# Patient Record
Sex: Female | Born: 1950 | Race: White | Hispanic: No | Smoking: Never smoker
Health system: Southern US, Community
[De-identification: ages and names within clinical notes are randomized; demographics above are authoritative.]

## PROBLEM LIST (undated history)

## (undated) DIAGNOSIS — R112 Nausea with vomiting, unspecified: Secondary | ICD-10-CM

## (undated) DIAGNOSIS — Z9889 Other specified postprocedural states: Secondary | ICD-10-CM

## (undated) DIAGNOSIS — I639 Cerebral infarction, unspecified: Secondary | ICD-10-CM

## (undated) DIAGNOSIS — K219 Gastro-esophageal reflux disease without esophagitis: Secondary | ICD-10-CM

## (undated) DIAGNOSIS — E785 Hyperlipidemia, unspecified: Secondary | ICD-10-CM

## (undated) DIAGNOSIS — I10 Essential (primary) hypertension: Secondary | ICD-10-CM

## (undated) DIAGNOSIS — H9191 Unspecified hearing loss, right ear: Secondary | ICD-10-CM

## (undated) DIAGNOSIS — I5189 Other ill-defined heart diseases: Secondary | ICD-10-CM

## (undated) DIAGNOSIS — F32A Depression, unspecified: Secondary | ICD-10-CM

## (undated) DIAGNOSIS — F329 Major depressive disorder, single episode, unspecified: Secondary | ICD-10-CM

## (undated) DIAGNOSIS — C44300 Unspecified malignant neoplasm of skin of unspecified part of face: Secondary | ICD-10-CM

## (undated) DIAGNOSIS — M1990 Unspecified osteoarthritis, unspecified site: Secondary | ICD-10-CM

## (undated) DIAGNOSIS — I2510 Atherosclerotic heart disease of native coronary artery without angina pectoris: Secondary | ICD-10-CM

## (undated) HISTORY — PX: CARDIAC CATHETERIZATION: SHX172

## (undated) HISTORY — DX: Other ill-defined heart diseases: I51.89

## (undated) HISTORY — PX: TUBAL LIGATION: SHX77

## (undated) HISTORY — DX: Atherosclerotic heart disease of native coronary artery without angina pectoris: I25.10

---

## 1982-12-29 HISTORY — PX: BREAST BIOPSY: SHX20

## 1988-12-29 HISTORY — PX: CHOLECYSTECTOMY: SHX55

## 1995-12-30 DIAGNOSIS — I639 Cerebral infarction, unspecified: Secondary | ICD-10-CM

## 1995-12-30 HISTORY — DX: Cerebral infarction, unspecified: I63.9

## 2004-12-29 DIAGNOSIS — I639 Cerebral infarction, unspecified: Secondary | ICD-10-CM

## 2004-12-29 HISTORY — DX: Cerebral infarction, unspecified: I63.9

## 2013-02-02 ENCOUNTER — Inpatient Hospital Stay (HOSPITAL_COMMUNITY)
Admission: EM | Admit: 2013-02-02 | Discharge: 2013-02-04 | DRG: 072 | Disposition: A | Discharge status: Home / Self Care | Payer: 59 | Attending: Internal Medicine | Admitting: Internal Medicine

## 2013-02-02 ENCOUNTER — Encounter (HOSPITAL_COMMUNITY): Payer: Self-pay

## 2013-02-02 ENCOUNTER — Inpatient Hospital Stay (HOSPITAL_COMMUNITY): Payer: 59

## 2013-02-02 ENCOUNTER — Emergency Department (HOSPITAL_COMMUNITY): Payer: 59

## 2013-02-02 DIAGNOSIS — R519 Headache, unspecified: Secondary | ICD-10-CM | POA: Diagnosis present

## 2013-02-02 DIAGNOSIS — R278 Other lack of coordination: Secondary | ICD-10-CM

## 2013-02-02 DIAGNOSIS — G9340 Encephalopathy, unspecified: Principal | ICD-10-CM

## 2013-02-02 DIAGNOSIS — I6789 Other cerebrovascular disease: Secondary | ICD-10-CM

## 2013-02-02 DIAGNOSIS — R51 Headache: Secondary | ICD-10-CM

## 2013-02-02 DIAGNOSIS — R739 Hyperglycemia, unspecified: Secondary | ICD-10-CM

## 2013-02-02 DIAGNOSIS — R825 Elevated urine levels of drugs, medicaments and biological substances: Secondary | ICD-10-CM | POA: Diagnosis present

## 2013-02-02 DIAGNOSIS — I69998 Other sequelae following unspecified cerebrovascular disease: Secondary | ICD-10-CM

## 2013-02-02 DIAGNOSIS — K219 Gastro-esophageal reflux disease without esophagitis: Secondary | ICD-10-CM

## 2013-02-02 DIAGNOSIS — Z8673 Personal history of transient ischemic attack (TIA), and cerebral infarction without residual deficits: Secondary | ICD-10-CM

## 2013-02-02 DIAGNOSIS — H9190 Unspecified hearing loss, unspecified ear: Secondary | ICD-10-CM | POA: Diagnosis present

## 2013-02-02 DIAGNOSIS — R7989 Other specified abnormal findings of blood chemistry: Secondary | ICD-10-CM | POA: Diagnosis present

## 2013-02-02 DIAGNOSIS — E860 Dehydration: Secondary | ICD-10-CM | POA: Diagnosis present

## 2013-02-02 DIAGNOSIS — R509 Fever, unspecified: Secondary | ICD-10-CM

## 2013-02-02 DIAGNOSIS — R279 Unspecified lack of coordination: Secondary | ICD-10-CM | POA: Diagnosis present

## 2013-02-02 DIAGNOSIS — I10 Essential (primary) hypertension: Secondary | ICD-10-CM

## 2013-02-02 DIAGNOSIS — E785 Hyperlipidemia, unspecified: Secondary | ICD-10-CM | POA: Diagnosis present

## 2013-02-02 DIAGNOSIS — R9431 Abnormal electrocardiogram [ECG] [EKG]: Secondary | ICD-10-CM

## 2013-02-02 DIAGNOSIS — R7309 Other abnormal glucose: Secondary | ICD-10-CM | POA: Diagnosis present

## 2013-02-02 DIAGNOSIS — B9789 Other viral agents as the cause of diseases classified elsewhere: Secondary | ICD-10-CM | POA: Diagnosis present

## 2013-02-02 HISTORY — DX: Hyperlipidemia, unspecified: E78.5

## 2013-02-02 HISTORY — DX: Cerebral infarction, unspecified: I63.9

## 2013-02-02 HISTORY — DX: Essential (primary) hypertension: I10

## 2013-02-02 HISTORY — DX: Gastro-esophageal reflux disease without esophagitis: K21.9

## 2013-02-02 LAB — COMPREHENSIVE METABOLIC PANEL
Albumin: 3.7 g/dL (ref 3.5–5.2)
Alkaline Phosphatase: 74 U/L (ref 39–117)
BUN: 14 mg/dL (ref 6–23)
CO2: 29 mEq/L (ref 19–32)
Chloride: 102 mEq/L (ref 96–112)
Creatinine, Ser: 0.66 mg/dL (ref 0.50–1.10)
GFR calc non Af Amer: >90 mL/min (ref >90)
Potassium: 3.6 mEq/L (ref 3.5–5.1)
Total Bilirubin: 0.1 mg/dL — ABNORMAL LOW (ref 0.3–1.2)

## 2013-02-02 LAB — URINALYSIS, ROUTINE W REFLEX MICROSCOPIC
Glucose, UA: NEGATIVE mg/dL
Hgb urine dipstick: NEGATIVE
Ketones, ur: 15 mg/dL — AB
Protein, ur: NEGATIVE mg/dL
Urobilinogen, UA: 1 mg/dL (ref 0.0–1.0)

## 2013-02-02 LAB — RAPID URINE DRUG SCREEN, HOSP PERFORMED
Amphetamines: NOT DETECTED
Benzodiazepines: NOT DETECTED
Cocaine: NOT DETECTED
Opiates: POSITIVE — AB

## 2013-02-02 LAB — CBC
HCT: 42.1 % (ref 36.0–46.0)
Hemoglobin: 14 g/dL (ref 12.0–15.0)
MCV: 99.3 fL (ref 78.0–100.0)
RBC: 4.24 MIL/uL (ref 3.87–5.11)
WBC: 10 10*3/uL (ref 4.0–10.5)

## 2013-02-02 LAB — TROPONIN I: Troponin I: 0.59 ng/mL (ref <0.30)

## 2013-02-02 LAB — POCT I-STAT, CHEM 8
BUN: 14 mg/dL (ref 6–23)
Calcium, Ion: 1.08 mmol/L — ABNORMAL LOW (ref 1.13–1.30)
Creatinine, Ser: 0.8 mg/dL (ref 0.50–1.10)
Hemoglobin: 14.3 g/dL (ref 12.0–15.0)
Sodium: 143 mEq/L (ref 135–145)
TCO2: 30 mmol/L (ref 0–100)

## 2013-02-02 LAB — GRAM STAIN

## 2013-02-02 LAB — CSF CELL COUNT WITH DIFFERENTIAL
RBC Count, CSF: 6 /mm3 — ABNORMAL HIGH
Tube #: 3

## 2013-02-02 LAB — INFLUENZA PANEL BY PCR (TYPE A & B)
Influenza A By PCR: NEGATIVE
Influenza B By PCR: NEGATIVE

## 2013-02-02 LAB — AMMONIA: Ammonia: 28 umol/L (ref 11–60)

## 2013-02-02 LAB — POCT I-STAT TROPONIN I: Troponin i, poc: 0.25 ng/mL (ref 0.00–0.08)

## 2013-02-02 MED ORDER — ACETAMINOPHEN 650 MG RE SUPP
650.0000 mg | Freq: Once | RECTAL | Status: AC
  Administered 2013-02-02: 650 mg via RECTAL
  Filled 2013-02-02: qty 1

## 2013-02-02 MED ORDER — ACETAMINOPHEN 325 MG PO TABS
650.0000 mg | ORAL_TABLET | ORAL | Status: DC | PRN
  Administered 2013-02-03: 650 mg via ORAL
  Filled 2013-02-02: qty 2

## 2013-02-02 MED ORDER — ASPIRIN 300 MG RE SUPP
300.0000 mg | Freq: Once | RECTAL | Status: AC
  Administered 2013-02-02: 300 mg via RECTAL
  Filled 2013-02-02: qty 1

## 2013-02-02 MED ORDER — ACETAMINOPHEN 325 MG PO TABS: 650.0000 mg | ORAL_TABLET | Freq: Once | ORAL | Status: DC

## 2013-02-02 MED ORDER — HYDROCHLOROTHIAZIDE 25 MG PO TABS
25.0000 mg | ORAL_TABLET | Freq: Every day | ORAL | Status: DC
  Administered 2013-02-02 – 2013-02-03 (×2): 25 mg via ORAL
  Filled 2013-02-02 (×2): qty 1

## 2013-02-02 MED ORDER — KETOROLAC TROMETHAMINE 30 MG/ML IJ SOLN
30.0000 mg | Freq: Four times a day (QID) | INTRAMUSCULAR | Status: DC | PRN
  Filled 2013-02-02: qty 1

## 2013-02-02 MED ORDER — SIMVASTATIN 20 MG PO TABS
20.0000 mg | ORAL_TABLET | Freq: Every day | ORAL | Status: DC
  Administered 2013-02-02 – 2013-02-03 (×2): 20 mg via ORAL
  Filled 2013-02-02 (×3): qty 1

## 2013-02-02 MED ORDER — ASPIRIN 300 MG RE SUPP
300.0000 mg | Freq: Every day | RECTAL | Status: DC
  Filled 2013-02-02 (×2): qty 1

## 2013-02-02 MED ORDER — BENAZEPRIL HCL 20 MG PO TABS
20.0000 mg | ORAL_TABLET | Freq: Every day | ORAL | Status: DC
  Administered 2013-02-02 – 2013-02-03 (×2): 20 mg via ORAL
  Filled 2013-02-02 (×2): qty 1

## 2013-02-02 MED ORDER — SODIUM CHLORIDE 0.9 % IV SOLN
INTRAVENOUS | Status: DC
  Administered 2013-02-02: 20:00:00 via INTRAVENOUS

## 2013-02-02 MED ORDER — ASPIRIN 325 MG PO TABS
325.0000 mg | ORAL_TABLET | Freq: Every day | ORAL | Status: DC
  Administered 2013-02-02 – 2013-02-03 (×2): 325 mg via ORAL
  Filled 2013-02-02 (×2): qty 1

## 2013-02-02 MED ORDER — BENAZEPRIL-HYDROCHLOROTHIAZIDE 20-25 MG PO TABS: 1.0000 | ORAL_TABLET | Freq: Every day | ORAL | Status: DC

## 2013-02-02 MED ORDER — ONDANSETRON HCL 4 MG/2ML IJ SOLN
4.0000 mg | Freq: Four times a day (QID) | INTRAMUSCULAR | Status: DC | PRN
  Administered 2013-02-02 (×3): 4 mg via INTRAVENOUS
  Filled 2013-02-02 (×2): qty 2

## 2013-02-02 MED ORDER — SENNOSIDES-DOCUSATE SODIUM 8.6-50 MG PO TABS: 1.0000 | ORAL_TABLET | Freq: Every evening | ORAL | Status: DC | PRN

## 2013-02-02 MED ORDER — CITALOPRAM HYDROBROMIDE 20 MG PO TABS
20.0000 mg | ORAL_TABLET | Freq: Every day | ORAL | Status: DC
  Administered 2013-02-02 – 2013-02-04 (×3): 20 mg via ORAL
  Filled 2013-02-02 (×3): qty 1

## 2013-02-02 MED ORDER — PANTOPRAZOLE SODIUM 40 MG PO TBEC
40.0000 mg | DELAYED_RELEASE_TABLET | Freq: Every day | ORAL | Status: DC
  Administered 2013-02-02 – 2013-02-04 (×3): 40 mg via ORAL
  Filled 2013-02-02 (×3): qty 1

## 2013-02-02 MED ORDER — SODIUM CHLORIDE 0.9 % IV BOLUS (SEPSIS)
1000.0000 mL | Freq: Once | INTRAVENOUS | Status: AC
  Administered 2013-02-02: 1000 mL via INTRAVENOUS

## 2013-02-02 NOTE — H&P (Signed)
[facility] History and Physical  Diane [name] Diane [name]:[id number] DOB: 19-Jul-1951 DOA: 02/02/2013  Referring physician: Valentina [name] PCP: Default, Provider, MD Dr. Sherril Cong Diane [name], Diane [name])  Chief Complaint: Fever, lethargy  History of Present Illness: Diane [name] is an 62 y.o. female with a PMH of CVA and HTN (only deficit from stroke is residual deafness right ear) who was brought to the ER via EMS after her fiancee called due to lethargy and fever.  Her fiancee was checking on her frequently through the night because she hadn't been feeling well (headache, cough, fatigue, nausea, back pain).  Had influenza vaccine this year.  No myalgias or arthralgias.  No vomiting.  Headache is global and rated an 8/10 at its worst, and worsens with noise and light.  The patient is disoriented that has no other focal neurological deficits except for residual deafness in her right ear as previously noted. A CT scan of her head does not show any acute process. An LP has been done by the emergency department physician and results are pending. Urine drug screen was positive for opiates and barbiturates however the patient states she took Fioricet 3 for her headache pain last night.  No leukocytosis and liver and kidney function are normal. She is being admitted for further evaluation and workup.  Review of Systems: Constitutional: + fever, no chills;  Appetite normal; No weight loss, no weight gain.  HEENT: No blurry vision, no diplopia, no pharyngitis, no dysphagia CV: No chest pain, no palpitations.  Resp: No SOB, + productive cough. GI: + nausea, no vomiting, no diarrhea, no melena, no hematochezia.  GU: + dysuria, no hematuria.  MSK: no myalgias, no arthralgias.  Neuro:  + headache, no focal neurological deficits, no history of seizures.  Psych: No depression, no anxiety.  Endo: No thyroid disease, no DM, no heat intolerance, no cold intolerance, no polyuria, no polydipsia  Skin: No rashes, no skin  lesions.  Heme: No easy bruising, no history of blood diseases.  Past Medical History Past Medical History  Diagnosis Date  . CVA (cerebral infarction)   . Hypertension   . GERD (gastroesophageal reflux disease)   . Hyperlipidemia      Past Surgical History Past Surgical History  Procedure Date  . Cesarean section      Social History: History   Social History  . Marital Status: Single    Spouse Name: Diane [name]    Number of Children: 8  . Years of Education: N/A   Occupational History  . Works full time at [employer] as [employer]   Social History Main Topics  . Smoking status: Never Smoker   . Smokeless tobacco: Not on file  . Alcohol Use: Yes     Comment: Rare social ETOH  . Drug Use: No  . Sexually Active: Not on file   Other Topics Concern  . Not on file   Social History Narrative   Divorced, but has fiancee.  Lives with fiancee in [place].  Moved here recently from [place], [place].  Normally independent of ADLs.    Family History:  Family History  Problem Relation Age of Onset  . Stroke Father   . Heart failure Father     Allergies: Review of patient's allergies indicates no known allergies.  Meds: Prior to Admission medications   Medication Sig Start Date End Date Taking? Authorizing Provider  benazepril-hydrochlorthiazide (LOTENSIN HCT) 20-25 MG per tablet Take 1 tablet by mouth daily.   Yes Historical Provider,  MD  esomeprazole (NEXIUM) 40 MG capsule Take 40 mg by mouth daily before breakfast.   Yes Historical Provider, MD  PRESCRIPTION MEDICATION Take 1 tablet by mouth daily. Cholesterol Medicine   Yes Historical Provider, MD    Physical Exam: Filed Vitals:   02/02/13 0425 02/02/13 0515 02/02/13 0558 02/02/13 0630  BP:  132/62 140/74 120/77  Pulse:  99 96 102  Temp:   99.2 F (37.3 C)   TempSrc:   Oral   Resp:  17 17 14   SpO2: 89% 96% 94% 100%     Physical Exam: Blood pressure 120/77, pulse 102, temperature 99.2 F (37.3  C), temperature source Oral, resp. rate 14, SpO2 100.00%. Gen: No acute distress. Head: Normocephalic, atraumatic. Eyes: PERRL, EOMI, full visual fields, sclerae nonicteric. Mouth: Oropharynx clear with dry mucous membranes. Tongue midline. Palate rises symmetrically. Neck: Supple, no thyromegaly, no lymphadenopathy, no jugular venous distention. No carotid bruits. Chest: Lungs clear to auscultation bilaterally with good air movement. CV: Heart sounds are tachycardic, regular, without murmur, rub, or gallops. Abdomen: Soft, nontender, nondistended with normal active bowel sounds. Extremities: Extremities are without clubbing, edema, or cyanosis. Skin: Warm and dry. Neuro: Alert and oriented times 2; cranial nerves II through XII grossly intact (deaf right ear).  Answers her birthdate when asked what today's date it is.  Thinks it is spring (season), knows she is at [facility]. Moves all extremities x4 with equal strength. No pronator drift. Prominent asterixis. Psych: Mood and affect normal.  Labs on Admission:  Basic Metabolic Panel:  Lab 02/02/13 1308 02/02/13 0409  NA 143 142  K 3.6 3.6  CL 105 102  CO2 -- 29  GLUCOSE 125* 129*  BUN 14 14  CREATININE 0.80 0.66  CALCIUM -- 8.9  MG -- --  PHOS -- --   Liver Function Tests:  Lab 02/02/13 0409  AST 29  ALT 32  ALKPHOS 74  BILITOT 0.1*  PROT 7.0  ALBUMIN 3.7    Lab 02/02/13 0602  AMMONIA 28   CBC:  Lab 02/02/13 0427 02/02/13 0409  WBC -- 10.0  NEUTROABS -- --  HGB 14.3 14.0  HCT 42.0 42.1  MCV -- 99.3  PLT -- 197   Cardiac Enzymes:  Lab 02/02/13 0409  CKTOTAL --  CKMB --  CKMBINDEX --  TROPONINI 0.59*   Urinalysis    Component Value Date/Time   COLORURINE YELLOW 02/02/2013 0435   APPEARANCEUR CLEAR 02/02/2013 0435   LABSPEC 1.030 02/02/2013 0435   PHURINE 5.5 02/02/2013 0435   GLUCOSEU NEGATIVE 02/02/2013 0435   HGBUR NEGATIVE 02/02/2013 0435   BILIRUBINUR SMALL* 02/02/2013 0435   KETONESUR 15* 02/02/2013 0435    PROTEINUR NEGATIVE 02/02/2013 0435   UROBILINOGEN 1.0 02/02/2013 0435   NITRITE NEGATIVE 02/02/2013 0435   LEUKOCYTESUR NEGATIVE 02/02/2013 0435   Drugs of Abuse     Component Value Date/Time   LABOPIA POSITIVE* 02/02/2013 0435   COCAINSCRNUR NONE DETECTED 02/02/2013 0435   LABBENZ NONE DETECTED 02/02/2013 0435   AMPHETMU NONE DETECTED 02/02/2013 0435   THCU NONE DETECTED 02/02/2013 0435   LABBARB POSITIVE* 02/02/2013 0435    Radiological Exams on Admission: Ct Head Wo Contrast  02/02/2013  *RADIOLOGY REPORT*  Clinical Data: Altered mental status.  Lethargy and oriented times two.  CT HEAD WITHOUT CONTRAST  Technique:  Contiguous axial images were obtained from the base of the skull through the vertex without contrast.  Comparison: None.  Findings: Mild cerebral atrophy.  No ventricular dilatation. Patchy low attenuation  changes in the deep white matter suggesting small vessel ischemia.  There is a focal area of lower attenuation in the left basal ganglia which could represent acute lacunar infarct.  Consider MRI for further characterization if clinically indicated.  No other focal areas of encephalomalacia demonstrated. No mass effect or midline shift.  No abnormal extra-axial fluid collections.  Gray-white matter junctions are distinct.  Basal cisterns are not effaced.  No acute intracranial hemorrhage.  No depressed skull fractures.  Visualized paranasal sinuses and mastoid air cells are not opacified.  Vascular calcifications.  IMPRESSION: Focal low attenuation lesion in the left basal ganglia could represent acute or chronic lacunar infarct.  Diffuse atrophy and small vessel ischemia.  No acute intracranial hemorrhage.   Original Report Authenticated By: Burman Nieves, M.D.    Dg Chest Portable 1 View  02/02/2013  *RADIOLOGY REPORT*  Clinical Data: Altered mental status.  Fever.  PORTABLE CHEST - 1 VIEW  Comparison: None.  Findings: Shallow inspiration.  Mild cardiac enlargement with mild pulmonary vascular  congestion.  No edema.  No focal airspace consolidation.  No blunting of costophrenic angles.  Calcification of the aorta.  No pneumothorax.  IMPRESSION: Mild cardiac enlargement pulmonary vascular congestion without edema or infiltration.   Original Report Authenticated By: Burman Nieves, M.D.     EKG: Independently reviewed. Sinus tachycardia at 113 beats per minute. T wave inversions in the anterior leads (V1-V3).  Assessment/Plan Principal Problem:  *Encephalopathy acute (in a patient with past medical history of stroke)/headaches/fever/asterixis  Unclear etiology. Although she has prominent asterixis, kidney and liver function are normal. No hyperammonemia. (Differential dx of asterixis: Liver disease, Wilson's disease, carbon dioxide toxicity, drug toxicity).  Barbiturates can cause asterixis, and her urine drug screen was positive for barbiturates which she took for her headache. Check sedimentation rate.  An LP has been done. Cell count only shows 1 white blood cell.  CT scan of the head is unremarkable. We'll obtain MRI/MRA, carotid Dopplers, and 2-dimensional cardiogram.  Neurology consultation requested. Active Problems:  Elevated troponin I level / abnormal EKG  Patient denies any complaints of chest pain or palpitations.  We'll start on aspirin.  Cardiology consultation has been requested. Obtain 2-dimensional echocardiogram.  Hyperglycemia  Check hemoglobin A1c in the morning.  GERD (gastroesophageal reflux disease)  Continue PPI therapy.  Hypertension  Continue usual antihypertensives.  Fever  Urinalysis without signs of infection. Chest x-ray negative for pneumonia. Preliminary LP findings not worrisome for meningitis.  Temperature max 100.8.  Given upper respiratory symptoms (cough, fatigue), will check influenza screen.  Hold off on empiric antibiotics for now.  Positive urine drug screen for opiates and barbiturates  Admits to taking Fioricet for  headache last night. Given asterixis, need to rule out excessive use or abuse of this medication.  Code Status: Full. Family Communication: Walter [name] (fiancee) Disposition Plan: Home when stable.  Time spent: One hour.  RAMA,CHRISTINA [facility] Pager [phone]  If 7PM-7AM, please contact night-coverage [email or web address] Password [facility] 02/02/2013, 7:51 AM

## 2013-02-02 NOTE — Progress Notes (Signed)
*  PRELIMINARY RESULTS* Echocardiogram 2D Echocardiogram has been performed.  Diane [name] 02/02/2013, 5:10 PM

## 2013-02-02 NOTE — Progress Notes (Signed)
Utilization Review Completed Daily Crate J. Kaila Devries, RN, BSN, NCM [phone]  

## 2013-02-02 NOTE — ED Notes (Signed)
Critical troponin 0.59 reported from lab. EDP notified.

## 2013-02-02 NOTE — ED Notes (Signed)
Patient transported to CT 

## 2013-02-02 NOTE — ED Notes (Signed)
Patient back from CT.

## 2013-02-02 NOTE — ED Notes (Signed)
patient able to state name, DOB (month, date, year) and that she's in the hospital. When asked about the year, pt sts it's [birth year]

## 2013-02-02 NOTE — ED Notes (Signed)
Patient transported to MRI 

## 2013-02-02 NOTE — ED Provider Notes (Signed)
History     CSN: [id number]  Arrival date & time 02/02/13  0355   First MD Initiated Contact with Patient 02/02/13 [phone]      Chief Complaint  Patient presents with  . Altered Mental Status    (Consider location/radiation/quality/duration/timing/severity/associated sxs/prior treatment) HPI HX per EMS, PT and fiance bedside. Feeling sick last 2 days with dry cough, lethargy and yesterday went down for a nap around 4pm.  About 3am her fiance went to check on her, she was difficult to wake up and fel t warm to touch - he called EMS who report AMS on scene, responded to narcan but still altered. PT denies any pain, vomiting, SOB, CP, rash or recent travel, no known sick contacts. On arrival to the ED, PT remains confused - is not a reliable historian. Symptoms mod to severe.  Past Medical History  Diagnosis Date  . CVA (cerebral infarction)   . Hypertension     No past surgical history on file.  No family history on file.  History  Substance Use Topics  . Smoking status: Not on file  . Smokeless tobacco: Not on file  . Alcohol Use:     OB History    Grav Para Term Preterm Abortions TAB SAB Ect Mult Living                  Review of Systems  Constitutional: Positive for fever.  HENT: Negative for neck pain and neck stiffness.   Eyes: Negative for visual disturbance.  Respiratory: Negative for shortness of breath.   Cardiovascular: Negative for chest pain.  Gastrointestinal: Negative for vomiting and abdominal pain.  Genitourinary: Negative for dysuria and flank pain.  Musculoskeletal: Negative for back pain.  Skin: Negative for rash.  Neurological: Negative for headaches.  Psychiatric/Behavioral: Positive for confusion.  All other systems reviewed and are negative.    Allergies  Review of patient's allergies indicates no known allergies.  Home Medications   Current Outpatient Rx  Name  Route  Sig  Dispense  Refill  . BENAZEPRIL-HYDROCHLOROTHIAZIDE 20-25 MG PO  TABS   Oral   Take 1 tablet by mouth daily.         [name] ESOMEPRAZOLE MAGNESIUM 40 MG PO CPDR   Oral   Take 40 mg by mouth daily before breakfast.         . PRESCRIPTION MEDICATION   Oral   Take 1 tablet by mouth daily. Cholesterol Medicine           BP 140/74  Pulse 96  Temp 99.2 F (37.3 C) (Oral)  Resp 17  SpO2 94%  Physical Exam  Constitutional: She appears well-developed and well-nourished.  HENT:  Head: Normocephalic and atraumatic.       Dry mm  Eyes: EOM are normal. Pupils are equal, round, and reactive to light.  Neck: Normal range of motion. Neck supple.  Cardiovascular: Regular rhythm and intact distal pulses.        tachycardic  Pulmonary/Chest: Effort normal and breath sounds normal. No stridor. No respiratory distress. She has no wheezes. She has no rales.  Abdominal: Soft. Bowel sounds are normal. She exhibits no distension. There is no tenderness.  Musculoskeletal: Normal range of motion. She exhibits no edema and no tenderness.       No unilateral defictis equal grips/ biceps/ triceps/ dorsi plantar flexion. Asterixis present bilat UEs  Lymphadenopathy:    She has no cervical adenopathy.  Neurological:       Awake, alert,  oriented to person and place, no time.   Skin: Skin is warm and dry.    ED Course  LUMBAR PUNCTURE Date/Time: 02/02/2013 6:40 AM Performed by: Sunnie Nielsen Authorized by: Sunnie Nielsen Consent: Verbal consent obtained. Risks and benefits: risks, benefits and alternatives were discussed Consent given by: patient Patient understanding: patient states understanding of the procedure being performed Patient consent: the patient's understanding of the procedure matches consent given Procedure consent: procedure consent matches procedure scheduled Required items: required blood products, implants, devices, and special equipment available Patient identity confirmed: arm band Time out: Immediately prior to procedure a "time out" was  called to verify the correct patient, procedure, equipment, support staff and site/side marked as required. Indications: evaluation for altered mental status and evaluation for infection Anesthesia: local infiltration Local anesthetic: lidocaine 1% without epinephrine Anesthetic total: 2 ml Preparation: Patient was prepped and draped in the usual sterile fashion. Lumbar space: L4-L5 interspace Patient's position: sitting Needle gauge: 20 Needle type: spinal needle - Quincke tip Needle length: 2.5 in Number of attempts: 1 Fluid appearance: clear Tubes of fluid: 4 Total volume: 4 ml Post-procedure: site cleaned and adhesive bandage applied Patient tolerance: Patient tolerated the procedure well with no immediate complications.   (including critical care time)  Results for orders placed during the hospital encounter of 02/02/13  CBC      Component Value Range   WBC 10.0  4.0 - 10.5 K/uL   RBC 4.24  3.87 - 5.11 MIL/uL   Hemoglobin 14.0  12.0 - 15.0 g/dL   HCT 19.1  47.8 - 29.5 %   MCV 99.3  78.0 - 100.0 fL   MCH 33.0  26.0 - 34.0 pg   MCHC 33.3  30.0 - 36.0 g/dL   RDW 62.1  30.8 - 65.7 %   Platelets 197  150 - 400 K/uL  COMPREHENSIVE METABOLIC PANEL      Component Value Range   Sodium 142  135 - 145 mEq/L   Potassium 3.6  3.5 - 5.1 mEq/L   Chloride 102  96 - 112 mEq/L   CO2 29  19 - 32 mEq/L   Glucose, Bld 129 (*) 70 - 99 mg/dL   BUN 14  6 - 23 mg/dL   Creatinine, Ser 8.46  0.50 - 1.10 mg/dL   Calcium 8.9  8.4 - 96.2 mg/dL   Total Protein 7.0  6.0 - 8.3 g/dL   Albumin 3.7  3.5 - 5.2 g/dL   AST 29  0 - 37 U/L   ALT 32  0 - 35 U/L   Alkaline Phosphatase 74  39 - 117 U/L   Total Bilirubin 0.1 (*) 0.3 - 1.2 mg/dL   GFR calc non Af Amer >90  >90 mL/min   GFR calc Af Amer >90  >90 mL/min  URINALYSIS, ROUTINE W REFLEX MICROSCOPIC      Component Value Range   Color, Urine YELLOW  YELLOW   APPearance CLEAR  CLEAR   Specific Gravity, Urine 1.030  1.005 - 1.030   pH 5.5  5.0 -  8.0   Glucose, UA NEGATIVE  NEGATIVE mg/dL   Hgb urine dipstick NEGATIVE  NEGATIVE   Bilirubin Urine SMALL (*) NEGATIVE   Ketones, ur 15 (*) NEGATIVE mg/dL   Protein, ur NEGATIVE  NEGATIVE mg/dL   Urobilinogen, UA 1.0  0.0 - 1.0 mg/dL   Nitrite NEGATIVE  NEGATIVE   Leukocytes, UA NEGATIVE  NEGATIVE  POCT I-STAT, CHEM 8  Component Value Range   Sodium 143  135 - 145 mEq/L   Potassium 3.6  3.5 - 5.1 mEq/L   Chloride 105  96 - 112 mEq/L   BUN 14  6 - 23 mg/dL   Creatinine, Ser 1.61  0.50 - 1.10 mg/dL   Glucose, Bld 096 (*) 70 - 99 mg/dL   Calcium, Ion 0.45 (*) 1.13 - 1.30 mmol/L   TCO2 30  0 - 100 mmol/L   Hemoglobin 14.3  12.0 - 15.0 g/dL   HCT 40.9  81.1 - 91.4 %  CG4 I-STAT (LACTIC ACID)      Component Value Range   Lactic Acid, Venous 1.19  0.5 - 2.2 mmol/L  POCT I-STAT TROPONIN I      Component Value Range   Troponin i, poc 0.25 (*) 0.00 - 0.08 ng/mL   Comment NOTIFIED PHYSICIAN     Comment 3           TROPONIN I      Component Value Range   Troponin I 0.59 (*) <0.30 ng/mL   Ct Head Wo Contrast  02/02/2013  *RADIOLOGY REPORT*  Clinical Data: Altered mental status.  Lethargy and oriented times two.  CT HEAD WITHOUT CONTRAST  Technique:  Contiguous axial images were obtained from the base of the skull through the vertex without contrast.  Comparison: None.  Findings: Mild cerebral atrophy.  No ventricular dilatation. Patchy low attenuation changes in the deep white matter suggesting small vessel ischemia.  There is a focal area of lower attenuation in the left basal ganglia which could represent acute lacunar infarct.  Consider MRI for further characterization if clinically indicated.  No other focal areas of encephalomalacia demonstrated. No mass effect or midline shift.  No abnormal extra-axial fluid collections.  Gray-white matter junctions are distinct.  Basal cisterns are not effaced.  No acute intracranial hemorrhage.  No depressed skull fractures.  Visualized paranasal  sinuses and mastoid air cells are not opacified.  Vascular calcifications.  IMPRESSION: Focal low attenuation lesion in the left basal ganglia could represent acute or chronic lacunar infarct.  Diffuse atrophy and small vessel ischemia.  No acute intracranial hemorrhage.   Original Report Authenticated By: Burman Nieves, M.D.    Dg Chest Portable 1 View  02/02/2013  *RADIOLOGY REPORT*  Clinical Data: Altered mental status.  Fever.  PORTABLE CHEST - 1 VIEW  Comparison: None.  Findings: Shallow inspiration.  Mild cardiac enlargement with mild pulmonary vascular congestion.  No edema.  No focal airspace consolidation.  No blunting of costophrenic angles.  Calcification of the aorta.  No pneumothorax.  IMPRESSION: Mild cardiac enlargement pulmonary vascular congestion without edema or infiltration.   Original Report Authenticated By: Burman Nieves, M.D.     Date: 02/02/2013  Rate: 112  Rhythm: sinus tachycardia  QRS Axis: normal  Intervals: normal  ST/T Wave abnormalities: nonspecific ST changes  Conduction Disutrbances:none  Narrative Interpretation:   Old EKG Reviewed: none available  6:18 AM NEU consult - d/w Dr Thad Ranger as above, plan LP now.  Also, d/w MED, Dr Julian Reil recommends carry over to 7am team, recommends hold heparin for now, CAR consult requested.   6:53 AM d/w CAR, Dr Eden Emms, agrees with plan MED admit, no heparin.  Will evaluate.  MDM  Fever, AMS, elevated troponin with presentation that does not suggest ACS  Evaluated with ECG, labs and imaging reviewed as above  LP performed  Multiple consultants involved.   Plan MED admit.  Sunnie Nielsen, MD 02/02/13 [phone]

## 2013-02-02 NOTE — ED Notes (Signed)
When I asked pt what year it was, what month it was, and what day of the week it was she repeated 53 several times.  Pt now opening eyes spontaneously.  Boyfriend at bedside.

## 2013-02-02 NOTE — ED Notes (Signed)
Placed pt on 2L O2 for sat of 89% on RA.

## 2013-02-02 NOTE — ED Notes (Addendum)
Per report.Marland KitchenMarland KitchenMarland KitchenPt decided to go to bed around 1600 yesterday because she did not feel well.  Around 0300 pt was found with AMS upon waking by her boyfriend.  Boyfriend states that he could not get her to wake up.  Pt was given Narcan by EMS.  At that point they noticed a slight improvement in her LOC but she still was not at baseline.  Pt currently lethargic and oriented to person and place only.

## 2013-02-03 DIAGNOSIS — R7989 Other specified abnormal findings of blood chemistry: Secondary | ICD-10-CM

## 2013-02-03 DIAGNOSIS — R509 Fever, unspecified: Secondary | ICD-10-CM

## 2013-02-03 DIAGNOSIS — R7309 Other abnormal glucose: Secondary | ICD-10-CM

## 2013-02-03 LAB — CBC
MCHC: 34.2 g/dL (ref 30.0–36.0)
Platelets: 158 10*3/uL (ref 150–400)
RDW: 12.2 % (ref 11.5–15.5)

## 2013-02-03 LAB — COMPREHENSIVE METABOLIC PANEL
ALT: 26 U/L (ref 0–35)
Albumin: 3.5 g/dL (ref 3.5–5.2)
Alkaline Phosphatase: 71 U/L (ref 39–117)
BUN: 8 mg/dL (ref 6–23)
Potassium: 3.7 mEq/L (ref 3.5–5.1)
Sodium: 141 mEq/L (ref 135–145)
Total Protein: 6.7 g/dL (ref 6.0–8.3)

## 2013-02-03 LAB — LIPID PANEL
HDL: 63 mg/dL (ref >39)
LDL Cholesterol: 111 mg/dL — ABNORMAL HIGH (ref 0–99)
Triglycerides: 78 mg/dL (ref <150)
VLDL: 16 mg/dL (ref 0–40)

## 2013-02-03 LAB — CK TOTAL AND CKMB (NOT AT ARMC): Relative Index: 1.8 (ref 0.0–2.5)

## 2013-02-03 NOTE — Evaluation (Signed)
SLP Screen Note  Patient Details Name: Diane [name] MRN: [id number] DOB: Sep 14, 1951   Screen treatment:       Reason Eval/Treat Not Completed:  (pt screened, full eval not necessary, at baseline speech/lang).  Pt able to verbalize medical condition, information about career and was oriented.  She states she is at her baseline function.  RN reports pt without cog-ling deficits as well.  Donavan Burnet, MS [facility] SLP [phone]

## 2013-02-03 NOTE — Evaluation (Signed)
Physical Therapy Evaluation Patient Details Name: Diane [name] MRN: [id number] DOB: 07-23-1951 Today's Date: 02/03/2013 Time: 0810-0829 PT Time Calculation (min): 19 min  PT Assessment / Plan / Recommendation Clinical Impression  pt presents with r/o Encephalopathy.  pt moving great and seems to be at or near baseline per pt.  Independent with all mobility and balance activies.  No skilled PT needs at this time.  Will sign off.      PT Assessment  Patent does not need any further PT services    Follow Up Recommendations  No PT follow up    Does the patient have the potential to tolerate intense rehabilitation      Barriers to Discharge        Equipment Recommendations  None recommended by PT    Recommendations for Other Services     Frequency      Precautions / Restrictions Precautions Precautions: None Restrictions Weight Bearing Restrictions: No   Pertinent Vitals/Pain Denies pain.        Mobility  Bed Mobility Bed Mobility: Supine to Sit;Sitting - Scoot to Edge of Bed Supine to Sit: 7: Independent Sitting - Scoot to Edge of Bed: 7: Independent Transfers Transfers: Sit to Stand;Stand to Sit Sit to Stand: 7: Independent;With upper extremity assist;From bed;From toilet Stand to Sit: 7: Independent;With upper extremity assist;To toilet;To chair/3-in-1 Details for Transfer Assistance: demos good safety.   Ambulation/Gait Ambulation/Gait Assistance: 6: Modified independent (Device/Increase time) Ambulation Distance (Feet): 250 Feet Assistive device: None Ambulation/Gait Assistance Details: demos good safety, moves a little slower than baseline per pt 2/2 feeling tired.    Gait Pattern: Within Functional Limits Stairs: No Wheelchair Mobility Wheelchair Mobility: No    Exercises     PT Diagnosis:    PT Problem List:   PT Treatment Interventions:     PT Goals    Visit Information  Last PT Received On: 02/03/13 Assistance Needed: +1    Subjective  Data  Subjective: I just feel a little wore out today.   Patient Stated Goal: Back to normal.     Prior Functioning  Home Living Lives With: Significant other (Fiance's mother) Available Help at Discharge: Family;Available PRN/intermittently Type of Home: House Home Layout:  (Fiance's mother lives in basement apartment.  ) Home Adaptive Equipment: None Prior Function Level of Independence: Independent Able to Take Stairs?: Yes Driving: Yes Communication Communication: No difficulties    Cognition  Cognition Overall Cognitive Status: Appears within functional limits for tasks assessed/performed Arousal/Alertness: Awake/alert Orientation Level: Appears intact for tasks assessed Behavior During Session: [facility] for tasks performed    Extremity/Trunk Assessment Right Lower Extremity Assessment RLE ROM/Strength/Tone: WFL for tasks assessed RLE Sensation: WFL - Light Touch Left Lower Extremity Assessment LLE ROM/Strength/Tone: WFL for tasks assessed LLE Sensation: WFL - Light Touch   Balance Balance Balance Assessed: Yes High Level Balance High Level Balance Activites: Braiding;Direction changes;Turns;Sudden stops  End of Session PT - End of Session Equipment Utilized During Treatment: Gait belt Activity Tolerance: Patient tolerated treatment well Patient left: in chair;with call bell/phone within reach Nurse Communication: Mobility status  GP     Sunny Schlein, [place] [phone] 02/03/2013, 8:39 AM

## 2013-02-03 NOTE — Progress Notes (Signed)
[facility] Progress Note [place] TEAM 1 - Stepdown/ICU TEAM   Bernarda Erck ZOX:[id number] DOB: 1951-11-08 DOA: 02/02/2013 PCP: Default, Provider, MD  Brief narrative: Diane [name] is an 62 y.o. female with a PMH of CVA and HTN (only deficit from stroke is residual deafness right ear) who was brought to the ER via EMS after her fiancee called due to lethargy and fever. Her fiancee was checking on her frequently through the night because she hadn't been feeling well (headache, cough, fatigue, nausea, back pain). Had influenza vaccine this year. No myalgias or arthralgias. No vomiting. Headache is global and rated an 8/10 at its worst, and worsens with noise and light. The patient is disoriented that has no other focal neurological deficits except for residual deafness in her right ear as previously noted. A CT scan of her head does not show any acute process. An LP has been done by the emergency department physician and results are pending. Urine drug screen was positive for opiates and barbiturates however the patient states she took Fioricet  for her headache pain last night   Assessment/Plan: Principal Problem:  *Encephalopathy acute/ Asterixis Completely resolved and was likely secondary to acute viral illness, poor by mouth intake and dehydration with superimposed use of Fioricet the night prior to admission. MRI reveals old infarct, echo is unremarkable. Carotid Dopplers are pending  Active Problems:   Elevated troponin I level Suspect this may have been related to hypoxia and hypotension-I will recheck enzymes today-patient has not had any chest pain at rest or with exertion  EKG from yesterday reveals tachycardia without any other abnormality   Fever Suspect this was viral and was what was making her ill for the past 3 days with complaints of headache nausea and fatigue Influenza negative   Hyperglycemia A.m. sugar was about 100-hemoglobin A1c is 5.2   History of stroke No new CVA noted on MRI  Dehydration Elevated urine specific gravity and BUN/creatinine ratio point toward dehydration. As by mouth intake is not at baseline, I agree with IV fluids and will continue them at 50 cc an hour   Hypertension Stable-hold diuretics while attempting to hydrate   Headache She states once a month she takes Fioricet for migraines. This time her headache was in between her eyes and she suspected was related to her sinuses. It has resolved.  Positive urine drug screen for opiates and barbiturates As a result of Fioricet   Code Status: Full code Family Communication: With fiance Disposition Plan:  probably home tomorrow  Consultants: None  Procedures: None  Antibiotics: None  DVT prophylaxis:   HPI/Subjective: Patient is quite alert and states that she's feeling much better. She was nauseated this morning and did not want breakfast but no longer has nausea and was able to eat lunch. She did ambulate in the hall this morning with some fatigue afterwards but by this afternoon he is feeling much more energetic. Her headache has resolved. There is no sinus pain between her eyes anymore. No cough or shortness of breath. She does not remember coming to the hospital yesterday. Her fiance who is at the bedside states that she was quite lethargic when he found her.   Objective: Blood pressure 138/76, pulse 81, temperature 97.9 F (36.6 C), temperature source Oral, resp. rate 22, height 5\' 2"  (1.575 m), weight 79.1 kg (174 lb 6.1 oz), SpO2 100.00%.  Intake/Output Summary (Last 24 hours) at 02/03/13 1554 Last data filed at 02/03/13 0636  Gross per  24 hour  Intake    500 ml  Output    350 ml  Net    150 ml     Exam: General: No acute respiratory distress Lungs: Clear to auscultation bilaterally without wheezes or crackles Cardiovascular: Regular rate and rhythm without murmur gallop or rub normal S1 and S2 Abdomen: Nontender,  nondistended, soft, bowel sounds positive, no rebound, no ascites, no appreciable mass Extremities: No significant cyanosis, clubbing, or edema bilateral lower extremities  Data Reviewed: Basic Metabolic Panel:  Lab 02/03/13 1478 02/02/13 0427 02/02/13 0409  NA 141 143 142  K 3.7 3.6 3.6  CL 100 105 102  CO2 33* -- 29  GLUCOSE 100* 125* 129*  BUN 8 14 14   CREATININE 0.50 0.80 0.66  CALCIUM 9.2 -- 8.9  MG -- -- --  PHOS -- -- --   Liver Function Tests:  Lab 02/03/13 0904 02/02/13 0409  AST 28 29  ALT 26 32  ALKPHOS 71 74  BILITOT 0.4 0.1*  PROT 6.7 7.0  ALBUMIN 3.5 3.7   No results found for this basename: LIPASE:5,AMYLASE:5 in the last 168 hours  Lab 02/02/13 0602  AMMONIA 28   CBC:  Lab 02/03/13 0904 02/02/13 0427 02/02/13 0409  WBC 4.7 -- 10.0  NEUTROABS -- -- --  HGB 12.5 14.3 14.0  HCT 36.6 42.0 42.1  MCV 96.1 -- 99.3  PLT 158 -- 197   Cardiac Enzymes:  Lab 02/02/13 2223 02/02/13 1606 02/02/13 1140 02/02/13 0409  CKTOTAL -- -- -- --  CKMB -- -- -- --  CKMBINDEX -- -- -- --  TROPONINI 0.39* 0.59* 0.77* 0.59*   BNP (last 3 results) No results found for this basename: PROBNP:3 in the last 8760 hours CBG: No results found for this basename: GLUCAP:5 in the last 168 hours  Recent Results (from the past 240 hour(s))  CSF CULTURE     Status: Normal (Preliminary result)   Collection Time   02/02/13  6:40 AM      Component Value Range Status Comment   Specimen Description CSF   Final    Special Requests NONE   Final    Gram Stain     Final    Value: CYTOSPIN WBC PRESENT, PREDOMINANTLY MONONUCLEAR     NO ORGANISMS SEEN     Performed at [facility]   Culture NO GROWTH 1 DAY   Final    Report Status PENDING   Incomplete   GRAM STAIN     Status: Normal   Collection Time   02/02/13  6:40 AM      Component Value Range Status Comment   Specimen Description CSF   Final    Special Requests NONE   Final    Gram Stain     Final    Value: CYTOSPIN SLIDE:      SQUAMOUS EPITHELIAL CELLS PRESENT     WBC PRESENT, PREDOMINANTLY MONONUCLEAR     NO ORGANISMS SEEN   Report Status 02/02/2013 FINAL   Final      Studies:  Recent x-ray studies have been reviewed in detail by the Attending Physician  Scheduled Meds:  Reviewed in detail by the Attending Physician   [facility]  If 7PM-7AM, please contact night-coverage [email or web address] Password [email or web address] 02/03/2013, 3:54 PM   LOS: 1 day

## 2013-02-03 NOTE — Progress Notes (Signed)
*  PRELIMINARY RESULTS* Vascular Ultrasound Carotid Duplex (Doppler) has been completed.   There is no obvious evidence of hemodynamically significant internal carotid artery stenosis >40%. Vertebral arteries are patent with antegrade flow.  02/03/2013 4:09 PM Gertie Fey, RDMS, RDCS

## 2013-02-04 DIAGNOSIS — R51 Headache: Secondary | ICD-10-CM

## 2013-02-04 DIAGNOSIS — R279 Unspecified lack of coordination: Secondary | ICD-10-CM

## 2013-02-04 NOTE — Discharge Summary (Signed)
DISCHARGE SUMMARY  Diane [name]  MR#: [id number]  DOB:07-16-1951  Date of Admission: 02/02/2013 Date of Discharge: 02/04/2013  Attending Physician:Mylin Gignac T  Patient's WUJ:[id number], Provider, MD  Consults:  none  Disposition:  D/C home  Follow-up Appts:     Follow-up Information    Follow up with Your primary care provider.   Contact information:   keep your regular scheduled follow-up appointment with your primary care provider        Discharge Diagnoses: Encephalopathy acute/ Asterixis  Elevated troponin I  Fever  Hyperglycemia  History of stroke  Dehydration  Hypertension  Headache   Initial presentation: 62 y.o. female with a PMH of CVA and HTN (only deficit from stroke is residual deafness right ear) who was brought to the ER via EMS after her fiancee called due to lethargy and fever. Her fiancee was checking on her frequently through the night because she hadn't been feeling well (headache, cough, fatigue, nausea, back pain). Had influenza vaccine this year. No myalgias or arthralgias. No vomiting. Headache was global and rated an 8/10 at its worst, and worsened with noise and light. The patient was disoriented but had no other focal neurological deficits except for residual deafness in her right ear as previously noted. A CT scan of her head did not show an acute process. An LP was done by the emergency department physician. Urine drug screen was positive for opiates and barbiturates however the patient states she took Fioricet for her headache pain.   Hospital Course:  Encephalopathy acute/ Asterixis  Completely resolved - likely secondary to acute viral illness, poor by mouth intake and dehydration with superimposed use of Fioricet the night prior to admission - MRI revealed old infarct - echo unremarkable - Carotid Dopplers w/o evidence of stenosis - cultures from LP unrevealing - cell count and gram stain benign appearing - apparently fluid glucose and  protein were not ordered by the MD who performed the LP  Elevated troponin I  Suspect this may have been related to hypotension - patient has not had any chest pain at rest or with exertion - EKG revealed tachycardia without any other abnormality - trop rapidly normalized   Fever  Suspect this was viral and was what was making her ill for the past 3 days with complaints of headache nausea and fatigue - Influenza negative - no recurrence of fever since admit   Hyperglycemia  hemoglobin A1c 5.2 - Likely stress related   History of stroke  No new CVA noted on MRI   Dehydration  Elevated urine specific gravity and BUN/creatinine ratio point toward dehydration - has been volume resuscitated w/ IVF   Hypertension  With hydration, BP is rebounding into hypertensive range - resume home BP med   Headache  She states once a month she takes Fioricet for migraines. This time her headache was in between her eyes and she suspected was related to her sinuses. It has resolved.   Positive urine drug screen for opiates and barbiturates  As a result of Fioricet    Medication List     As of 02/04/2013 12:10 PM    TAKE these medications         benazepril-hydrochlorthiazide 20-25 MG per tablet   Commonly known as: LOTENSIN HCT   Take 1 tablet by mouth daily.      butalbital-acetaminophen-caffeine 50-325-40-30 MG per capsule   Commonly known as: FIORICET WITH CODEINE   Take 1-2 capsules by mouth daily as needed. PRN pain  citalopram 20 MG tablet   Commonly known as: CELEXA   Take 20 mg by mouth daily.      esomeprazole 40 MG capsule   Commonly known as: NEXIUM   Take 40 mg by mouth daily before breakfast.      pravastatin 40 MG tablet   Commonly known as: PRAVACHOL   Take 40 mg by mouth daily.        Day of Discharge BP 149/79  Pulse 71  Temp 98.4 F (36.9 C) (Oral)  Resp 21  Ht 5\' 2"  (1.575 m)  Wt 79.1 kg (174 lb 6.1 oz)  BMI 31.90 kg/m2  SpO2 98%  Physical  Exam: General: No acute respiratory distress - alert and oriented - CN II-XII intact B - no photophobia or meningismus  Lungs: Clear to auscultation bilaterally without wheezes or crackles Cardiovascular: Regular rate and rhythm without murmur gallop or rub normal S1 and S2 Abdomen: Nontender, nondistended, soft, bowel sounds positive, no rebound, no ascites, no appreciable mass Extremities: No significant cyanosis, clubbing, or edema bilateral lower extremities  Results for orders placed during the hospital encounter of 02/02/13 (from the past 24 hour(s))  CK TOTAL AND CKMB     Status: Abnormal   Collection Time   02/03/13  4:19 PM      Component Value Range   Total CK 243 (*) 7 - 177 U/L   CK, MB 4.4 (*) 0.3 - 4.0 ng/mL   Relative Index 1.8  0.0 - 2.5  TROPONIN I     Status: Normal   Collection Time   02/03/13  4:19 PM      Component Value Range   Troponin I <0.30  <0.30 ng/mL   Time spent in discharge (includes decision making & examination of pt): 30 minutes  02/04/2013, 12:10 PM   Lonia Blood, MD [facility] Office  [phone] Pager [phone]  On-Call/Text Page:      [email or web address]      password [facility]

## 2013-04-11 ENCOUNTER — Ambulatory Visit (INDEPENDENT_AMBULATORY_CARE_PROVIDER_SITE_OTHER): Payer: 59 | Admitting: Family Medicine

## 2013-04-11 ENCOUNTER — Encounter: Payer: Self-pay | Admitting: Family Medicine

## 2013-04-11 VITALS — BP 118/80 | HR 72 | Temp 98.2°F | Ht 62.0 in | Wt 175.0 lb

## 2013-04-11 DIAGNOSIS — Z1211 Encounter for screening for malignant neoplasm of colon: Secondary | ICD-10-CM

## 2013-04-11 DIAGNOSIS — Z8673 Personal history of transient ischemic attack (TIA), and cerebral infarction without residual deficits: Secondary | ICD-10-CM

## 2013-04-11 DIAGNOSIS — F411 Generalized anxiety disorder: Secondary | ICD-10-CM

## 2013-04-11 DIAGNOSIS — R4182 Altered mental status, unspecified: Secondary | ICD-10-CM

## 2013-04-11 DIAGNOSIS — K219 Gastro-esophageal reflux disease without esophagitis: Secondary | ICD-10-CM

## 2013-04-11 DIAGNOSIS — I10 Essential (primary) hypertension: Secondary | ICD-10-CM

## 2013-04-11 DIAGNOSIS — Z0000 Encounter for general adult medical examination without abnormal findings: Secondary | ICD-10-CM

## 2013-04-11 MED ORDER — ASPIRIN 81 MG PO TBEC: 81.0000 mg | DELAYED_RELEASE_TABLET | Freq: Every day | ORAL | Status: DC

## 2013-04-11 MED ORDER — CITALOPRAM HYDROBROMIDE 40 MG PO TABS: 40.0000 mg | ORAL_TABLET | Freq: Every day | ORAL | Status: DC

## 2013-04-11 NOTE — Progress Notes (Signed)
Subjective:    Patient ID: Diane [name], female    DOB: 08/05/1951, 62 y.o.   MRN: [id number]  HPI  Very pleasant 62 yo G8P8 with h/o CVA, HTN, HLD here to establish care and for CPX.  H/o CVA 9 years ago but has been quite healthy.  In February 01/2013, she was admitted to [facility] ICU for fever and altered mental status.  Notes reviewed.  Felt her symptoms were related to viral illness and dehydration. MRI of brain, carotid dopplers and LP neg. Cr and troponins normalized. Symptoms have resolved completely.  Lab Results  Component Value Date   CREATININE 0.50 02/03/2013   H/o CVA- on ASA 81 mg daily. Residual right ear deafness otherwise no residual deficits.  HTN HLD- on pravachol 40 mg daily. Lab Results  Component Value Date   CHOL 190 02/03/2013   HDL 63 02/03/2013   LDLCALC 147* 02/03/2013   TRIG 78 02/03/2013   CHOLHDL 3.0 02/03/2013   No family h/o breast, uterine or cervical CA. LMP 5 years ago.  Last pap smear and mammogram in 2011. Refuses colonoscopy but willing to take IFOB.  Works at [employer].  Very active.  GAD- on celexa 20 mg daily and worked well for years.  Recently has had more panic attacks. No SI or HI and denies feeling depressed.  No SI or HI.  Patient Active Problem List  Diagnosis  . History of stroke  . GERD (gastroesophageal reflux disease)  . Hypertension  . Generalized anxiety disorder  . Routine general medical examination at a health care facility   Past Medical History  Diagnosis Date  . CVA (cerebral infarction)   . Hypertension   . GERD (gastroesophageal reflux disease)   . Hyperlipidemia    Past Surgical History  Procedure Laterality Date  . Cesarean section     History  Substance Use Topics  . Smoking status: Never Smoker   . Smokeless tobacco: Never Used     Comment: LIVES WITH 2 SMOKERS   . Alcohol Use: Yes     Comment: Rare social ETOH   Family History  Problem Relation Age of Onset  . Stroke Father   . Heart  failure Father    No Known Allergies Current Outpatient Prescriptions on File Prior to Visit  Medication Sig Dispense Refill  . benazepril-hydrochlorthiazide (LOTENSIN HCT) 20-25 MG per tablet Take 1 tablet by mouth daily.      . butalbital-acetaminophen-caffeine (FIORICET WITH CODEINE) 50-325-40-30 MG per capsule Take 1-2 capsules by mouth daily as needed. PRN pain      . esomeprazole (NEXIUM) 40 MG capsule Take 40 mg by mouth daily before breakfast.      . pravastatin (PRAVACHOL) 40 MG tablet Take 40 mg by mouth daily.       No current facility-administered medications on file prior to visit.   The PMH, PSH, Social History, Family History, Medications, and allergies have been reviewed in [facility], and have been updated if relevant.   Review of Systems See HPI Patient reports no  vision/ hearing changes,anorexia, weight change, fever ,adenopathy, persistant / recurrent hoarseness, swallowing issues, chest pain, edema,persistant / recurrent cough, hemoptysis, dyspnea(rest, exertional, paroxysmal nocturnal), gastrointestinal  bleeding (melena, rectal bleeding), abdominal pain, excessive heart burn, GU symptoms(dysuria, hematuria, pyuria, voiding/incontinence  Issues) syncope, focal weakness, severe memory loss, concerning skin lesions, depression, anxiety, abnormal bruising/bleeding, major joint swelling, breast masses or abnormal vaginal bleeding.       Objective:   Physical Exam BP 118/80  Pulse 72  Temp(Src) 98.2 F (36.8 C)  Ht 5\' 2"  (1.575 m)  Wt 175 lb (79.379 kg)  BMI 32 kg/m2  General:  Well-developed,well-nourished,in no acute distress; alert,appropriate and cooperative throughout examination Head:  normocephalic and atraumatic.   Eyes:  vision grossly intact, pupils equal, pupils round, and pupils reactive to light.   Ears:  R ear normal and L ear normal.   Nose:  no external deformity.   Mouth:  good dentition.   Neck:  No deformities, masses, or tenderness noted. Breasts:   No mass, nodules, thickening, tenderness, bulging, retraction, inflamation, nipple discharge or skin changes noted.   Lungs:  Normal respiratory effort, chest expands symmetrically. Lungs are clear to auscultation, no crackles or wheezes. Heart:  Normal rate and regular rhythm. S1 and S2 normal without gallop, murmur, click, rub or other extra sounds. Abdomen:  Bowel sounds positive,abdomen soft and non-tender without masses, organomegaly or hernias noted. Msk:  No deformity or scoliosis noted of thoracic or lumbar spine.   Extremities:  No clubbing, cyanosis, edema, or deformity noted with normal full range of motion of all joints.   Neurologic:  alert & oriented X3 and gait normal.   Skin:  Intact without suspicious lesions or rashes Psych:  Cognition and judgment appear intact. Alert and cooperative with normal attention span and concentration. No apparent delusions, illusions, hallucinations    Assessment & Plan:  1. Hypertension Stable on current medications.  2. History of stroke No recurrent CVA. BP well controlled. On ASA 81 mg daily.  3. GERD (gastroesophageal reflux disease) Quiet.  Rarely uses Nexium.  4. Generalized anxiety disorder Deteriorated.  Since celexa worked so well initially, will increase dose to 40 mg daily. Follow up in 3 weeks. The patient indicates understanding of these issues and agrees with the plan.   5. Routine general medical examination at a health care facility Reviewed preventive care protocols, scheduled due services, and updated immunizations Discussed nutrition, exercise, diet, and healthy lifestyle.  - Cytology - PAP  6. Screening for colon cancer  - Fecal occult blood, imunochemical; Future

## 2013-04-11 NOTE — Patient Instructions (Addendum)
Nice to meet you. We are increasing your celexa to 40 mg daily- ok to double up on what you have at home (20 mg ) until you run out.  Please stop by to see Shirlee Limerick on your way out to set up your mammogram.  Also pick up your stool cards in the lab.

## 2013-04-11 NOTE — Addendum Note (Signed)
Addended by: Alvina Chou on: 04/11/2013 04:15 PM   Modules accepted: Orders

## 2013-04-19 ENCOUNTER — Encounter: Payer: Self-pay | Admitting: Family Medicine

## 2013-04-19 ENCOUNTER — Telehealth: Payer: Self-pay | Admitting: *Deleted

## 2013-04-19 NOTE — Telephone Encounter (Signed)
Advised patient pap smear was normal, updated in chart.

## 2013-04-20 ENCOUNTER — Telehealth: Payer: Self-pay

## 2013-04-20 NOTE — Telephone Encounter (Signed)
Noted  

## 2013-04-20 NOTE — Telephone Encounter (Signed)
Advised Heather.  She needed to ask because pt had scheduled appt herself, was not referred.

## 2013-04-20 NOTE — Telephone Encounter (Signed)
[facility] left v/m to ask if Dr Dayton Martes would accept mammogram report for pt. Heather request call back.

## 2013-04-20 NOTE — Telephone Encounter (Signed)
Yes of course.  Is there a reason why she is asking this?

## 2013-04-28 ENCOUNTER — Encounter: Payer: Self-pay | Admitting: Family Medicine

## 2013-04-29 ENCOUNTER — Encounter: Payer: Self-pay | Admitting: Family Medicine

## 2013-04-29 ENCOUNTER — Encounter: Payer: Self-pay | Admitting: *Deleted

## 2013-05-03 ENCOUNTER — Other Ambulatory Visit (INDEPENDENT_AMBULATORY_CARE_PROVIDER_SITE_OTHER): Payer: 59

## 2013-05-03 DIAGNOSIS — Z1211 Encounter for screening for malignant neoplasm of colon: Secondary | ICD-10-CM

## 2013-05-03 LAB — FECAL OCCULT BLOOD, IMMUNOCHEMICAL: Fecal Occult Bld: NEGATIVE

## 2013-05-04 ENCOUNTER — Encounter: Payer: Self-pay | Admitting: *Deleted

## 2013-05-05 ENCOUNTER — Encounter: Payer: Self-pay | Admitting: Family Medicine

## 2013-05-06 ENCOUNTER — Other Ambulatory Visit: Payer: Self-pay | Admitting: *Deleted

## 2013-05-06 ENCOUNTER — Encounter: Payer: Self-pay | Admitting: *Deleted

## 2013-05-06 ENCOUNTER — Telehealth: Payer: Self-pay

## 2013-05-06 MED ORDER — CITALOPRAM HYDROBROMIDE 40 MG PO TABS: 40.0000 mg | ORAL_TABLET | Freq: Every day | ORAL | Status: DC

## 2013-05-06 NOTE — Telephone Encounter (Signed)
Pt signed up for an all natural herbal diet pill with [employer]. Pt took med x 2 and BP was 190/110, pt developed h/a. Pt stopped med after 2nd dose and after 2 days her BP was 110/78. Co will not cancel order or reimburse pt $50.00 without a letter from PCP that pt is on BP medication for hypertension; letter must include customer # [phone].[name] Fax letter to [phone] (no one's atten). Pt request call back.

## 2013-05-06 NOTE — Telephone Encounter (Signed)
Ok to write letter as pt requested and use my stamp.

## 2013-05-06 NOTE — Telephone Encounter (Signed)
Letter written and faxed.  Patient advised.

## 2013-05-16 ENCOUNTER — Other Ambulatory Visit: Payer: Self-pay | Admitting: *Deleted

## 2013-05-16 MED ORDER — CITALOPRAM HYDROBROMIDE 40 MG PO TABS: 40.0000 mg | ORAL_TABLET | Freq: Every day | ORAL | Status: DC

## 2013-05-18 ENCOUNTER — Other Ambulatory Visit: Payer: Self-pay | Admitting: *Deleted

## 2013-05-18 MED ORDER — BENAZEPRIL-HYDROCHLOROTHIAZIDE 20-25 MG PO TABS: 1.0000 | ORAL_TABLET | Freq: Every day | ORAL | Status: DC

## 2013-06-20 ENCOUNTER — Other Ambulatory Visit: Payer: Self-pay | Admitting: *Deleted

## 2013-06-20 MED ORDER — BUTALBITAL-APAP-CAFF-COD 50-325-40-30 MG PO CAPS: 1.0000 | ORAL_CAPSULE | Freq: Every day | ORAL | Status: DC | PRN

## 2013-06-20 MED ORDER — CITALOPRAM HYDROBROMIDE 40 MG PO TABS: 40.0000 mg | ORAL_TABLET | Freq: Every day | ORAL | Status: DC

## 2013-06-20 NOTE — Telephone Encounter (Signed)
Faxed refill request from [facility] for fiorinal and celexa- they are requesting 90 day supplies for each.  Form is on your desk.

## 2013-06-21 NOTE — Telephone Encounter (Signed)
Form and scripts fa

## 2013-06-21 NOTE — Telephone Encounter (Signed)
Form and scripts faxed back to [facility].

## 2013-08-11 ENCOUNTER — Other Ambulatory Visit: Payer: Self-pay

## 2013-08-11 MED ORDER — BUTALBITAL-APAP-CAFF-COD 50-325-40-30 MG PO CAPS: 1.0000 | ORAL_CAPSULE | Freq: Every day | ORAL | Status: DC | PRN

## 2013-08-11 NOTE — Telephone Encounter (Addendum)
Medication phoned to [facility] [address] pharmacy as instructed. Pt notified refill done.

## 2013-08-11 NOTE — Telephone Encounter (Signed)
Pt left v/m requesting refill Fioricet with codeine to [facility] [address].Please advise.

## 2013-09-12 ENCOUNTER — Other Ambulatory Visit: Payer: Self-pay

## 2013-09-12 MED ORDER — BUTALBITAL-APAP-CAFF-COD 50-325-40-30 MG PO CAPS: 1.0000 | ORAL_CAPSULE | Freq: Every day | ORAL | Status: DC | PRN

## 2013-09-12 NOTE — Telephone Encounter (Signed)
Refill called to [facility]. 

## 2013-09-12 NOTE — Telephone Encounter (Signed)
Pt left v/m requesting refill fioricet with codeine to [facility] [address].Please advise.

## 2013-09-13 ENCOUNTER — Other Ambulatory Visit: Payer: Self-pay

## 2013-09-13 MED ORDER — PRAVASTATIN SODIUM 40 MG PO TABS: 40.0000 mg | ORAL_TABLET | Freq: Every day | ORAL | Status: DC

## 2013-09-13 NOTE — Telephone Encounter (Signed)
Pt request refill pravastatin to [facility] [address]. Pt used to get from dr in [place]. Advised pt done.

## 2013-10-04 ENCOUNTER — Other Ambulatory Visit: Payer: Self-pay | Admitting: *Deleted

## 2013-10-04 ENCOUNTER — Other Ambulatory Visit: Payer: Self-pay | Admitting: Family Medicine

## 2013-10-04 NOTE — Telephone Encounter (Signed)
Last office visit 04/11/2013.  Ok to refill?

## 2013-10-04 NOTE — Telephone Encounter (Signed)
Ok to refill 

## 2013-10-05 MED ORDER — BUTALBITAL-APAP-CAFF-COD 50-325-40-30 MG PO CAPS: 1.0000 | ORAL_CAPSULE | Freq: Every day | ORAL | Status: DC | PRN

## 2013-10-05 NOTE — Telephone Encounter (Signed)
Rx called in as directed.   

## 2013-10-20 ENCOUNTER — Other Ambulatory Visit: Payer: Self-pay | Admitting: *Deleted

## 2013-10-20 NOTE — Telephone Encounter (Signed)
Faxed refill request. Please advise.  

## 2013-10-21 ENCOUNTER — Other Ambulatory Visit: Payer: Self-pay

## 2013-10-21 MED ORDER — BUTALBITAL-APAP-CAFF-COD 50-325-40-30 MG PO CAPS: 1.0000 | ORAL_CAPSULE | Freq: Every day | ORAL | Status: DC | PRN

## 2013-10-21 NOTE — Telephone Encounter (Signed)
Ok to refill as phone in. Thx!

## 2013-10-21 NOTE — Telephone Encounter (Signed)
Pt left v/m checking on migraine med (fiorinal with codeine)refill; spoke with Kathie Rhodes at [facility] and rx ready for pick up. pts husband advised med ready for pickup and he will let pt know.

## 2013-10-21 NOTE — Telephone Encounter (Signed)
Medication phoned to pharmacy.  

## 2013-10-27 ENCOUNTER — Emergency Department (HOSPITAL_COMMUNITY): Payer: 59

## 2013-10-27 ENCOUNTER — Observation Stay (HOSPITAL_COMMUNITY)
Admission: EM | Admit: 2013-10-27 | Discharge: 2013-10-28 | Disposition: A | Discharge status: Home / Self Care | Payer: 59 | Attending: Cardiology | Admitting: Cardiology

## 2013-10-27 ENCOUNTER — Encounter (HOSPITAL_COMMUNITY): Payer: Self-pay | Admitting: Internal Medicine

## 2013-10-27 DIAGNOSIS — I200 Unstable angina: Secondary | ICD-10-CM

## 2013-10-27 DIAGNOSIS — Z7982 Long term (current) use of aspirin: Secondary | ICD-10-CM | POA: Insufficient documentation

## 2013-10-27 DIAGNOSIS — R0602 Shortness of breath: Secondary | ICD-10-CM

## 2013-10-27 DIAGNOSIS — K219 Gastro-esophageal reflux disease without esophagitis: Secondary | ICD-10-CM | POA: Insufficient documentation

## 2013-10-27 DIAGNOSIS — E8779 Other fluid overload: Secondary | ICD-10-CM | POA: Insufficient documentation

## 2013-10-27 DIAGNOSIS — E785 Hyperlipidemia, unspecified: Secondary | ICD-10-CM | POA: Insufficient documentation

## 2013-10-27 DIAGNOSIS — Z8673 Personal history of transient ischemic attack (TIA), and cerebral infarction without residual deficits: Secondary | ICD-10-CM | POA: Insufficient documentation

## 2013-10-27 DIAGNOSIS — R05 Cough: Secondary | ICD-10-CM | POA: Insufficient documentation

## 2013-10-27 DIAGNOSIS — I10 Essential (primary) hypertension: Secondary | ICD-10-CM | POA: Insufficient documentation

## 2013-10-27 DIAGNOSIS — L301 Dyshidrosis [pompholyx]: Secondary | ICD-10-CM | POA: Insufficient documentation

## 2013-10-27 DIAGNOSIS — R059 Cough, unspecified: Secondary | ICD-10-CM | POA: Insufficient documentation

## 2013-10-27 DIAGNOSIS — Z79899 Other long term (current) drug therapy: Secondary | ICD-10-CM | POA: Insufficient documentation

## 2013-10-27 HISTORY — DX: Cerebral infarction, unspecified: I63.9

## 2013-10-27 LAB — TROPONIN I: Troponin I: <0.3 ng/mL (ref <0.30)

## 2013-10-27 LAB — CBC
HCT: 40.2 % (ref 36.0–46.0)
Hemoglobin: 14 g/dL (ref 12.0–15.0)
MCHC: 34.8 g/dL (ref 30.0–36.0)
RBC: 4.19 MIL/uL (ref 3.87–5.11)
WBC: 5.6 10*3/uL (ref 4.0–10.5)

## 2013-10-27 LAB — BASIC METABOLIC PANEL
BUN: 12 mg/dL (ref 6–23)
Calcium: 10.1 mg/dL (ref 8.4–10.5)
Chloride: 98 mEq/L (ref 96–112)
Creatinine, Ser: 0.54 mg/dL (ref 0.50–1.10)
GFR calc Af Amer: >90 mL/min (ref >90)
Potassium: 4 mEq/L (ref 3.5–5.1)
Sodium: 139 mEq/L (ref 135–145)

## 2013-10-27 LAB — PRO B NATRIURETIC PEPTIDE: Pro B Natriuretic peptide (BNP): 48.8 pg/mL (ref 0–125)

## 2013-10-27 MED ORDER — HEPARIN (PORCINE) IN NACL 100-0.45 UNIT/ML-% IJ SOLN
1050.0000 [IU]/h | INTRAMUSCULAR | Status: DC
  Administered 2013-10-27: 900 [IU]/h via INTRAVENOUS
  Filled 2013-10-27 (×2): qty 250

## 2013-10-27 MED ORDER — ASPIRIN EC 81 MG PO TBEC: 81.0000 mg | DELAYED_RELEASE_TABLET | Freq: Every day | ORAL | Status: DC

## 2013-10-27 MED ORDER — HEPARIN BOLUS VIA INFUSION
4000.0000 [IU] | Freq: Once | INTRAVENOUS | Status: AC
  Administered 2013-10-27: 4000 [IU] via INTRAVENOUS
  Filled 2013-10-27: qty 4000

## 2013-10-27 MED ORDER — ACETAMINOPHEN 325 MG PO TABS
650.0000 mg | ORAL_TABLET | ORAL | Status: DC | PRN
  Administered 2013-10-28: 650 mg via ORAL
  Filled 2013-10-27: qty 2

## 2013-10-27 MED ORDER — VITAMIN D3 25 MCG (1000 UNIT) PO TABS
1000.0000 [IU] | ORAL_TABLET | Freq: Every day | ORAL | Status: DC
  Filled 2013-10-27: qty 1

## 2013-10-27 MED ORDER — ASPIRIN 81 MG PO TBEC: 81.0000 mg | DELAYED_RELEASE_TABLET | Freq: Every day | ORAL | Status: DC

## 2013-10-27 MED ORDER — BENAZEPRIL HCL 20 MG PO TABS
20.0000 mg | ORAL_TABLET | Freq: Every day | ORAL | Status: DC
  Filled 2013-10-27: qty 1

## 2013-10-27 MED ORDER — ASPIRIN 300 MG RE SUPP: 300.0000 mg | RECTAL | Status: AC

## 2013-10-27 MED ORDER — ONDANSETRON HCL 4 MG/2ML IJ SOLN: 4.0000 mg | Freq: Four times a day (QID) | INTRAMUSCULAR | Status: DC | PRN

## 2013-10-27 MED ORDER — ASPIRIN EC 81 MG PO TBEC
81.0000 mg | DELAYED_RELEASE_TABLET | Freq: Every day | ORAL | Status: DC
  Filled 2013-10-27: qty 1

## 2013-10-27 MED ORDER — ASPIRIN 81 MG PO CHEW
324.0000 mg | CHEWABLE_TABLET | ORAL | Status: AC
  Administered 2013-10-28: 243 mg via ORAL
  Filled 2013-10-27: qty 4

## 2013-10-27 MED ORDER — HYDROCHLOROTHIAZIDE 25 MG PO TABS
25.0000 mg | ORAL_TABLET | Freq: Every day | ORAL | Status: DC
  Filled 2013-10-27: qty 1

## 2013-10-27 MED ORDER — CITALOPRAM HYDROBROMIDE 40 MG PO TABS
40.0000 mg | ORAL_TABLET | Freq: Every day | ORAL | Status: DC
  Administered 2013-10-28: 40 mg via ORAL
  Filled 2013-10-27 (×2): qty 1

## 2013-10-27 MED ORDER — ATORVASTATIN CALCIUM 80 MG PO TABS
80.0000 mg | ORAL_TABLET | Freq: Every day | ORAL | Status: DC
  Filled 2013-10-27 (×2): qty 1

## 2013-10-27 MED ORDER — BENAZEPRIL-HYDROCHLOROTHIAZIDE 20-25 MG PO TABS: 1.0000 | ORAL_TABLET | Freq: Every day | ORAL | Status: DC

## 2013-10-27 MED ORDER — PANTOPRAZOLE SODIUM 40 MG PO TBEC: 40.0000 mg | DELAYED_RELEASE_TABLET | Freq: Every day | ORAL | Status: DC

## 2013-10-27 MED ORDER — NITROGLYCERIN 0.4 MG SL SUBL: 0.4000 mg | SUBLINGUAL_TABLET | SUBLINGUAL | Status: DC | PRN

## 2013-10-27 NOTE — H&P (Addendum)
Diane [name] is an 62 y.o. female.   Chief Complaint: Dyspnea on exertion PCP: Dr. Dayton Martes HPI: Diane [name] is a 62 yo woman with PMH of CVA 12 years ago, hypertension, GERD and dyslipidemia followed by PCP Dr. Dayton Martes who called her PCP's office today for symptoms of increasing shortness of breath over the last 3-4 days that have progressed. She has had dyspnea on exertion that has progressed to now occuring after as little of 10 feet of walking. The dyspnea improves with rest and lying down. She's never had dyspnea like this before. She's also noted bilateral lower extremity edema. Given fairly abrupt onset of symptoms, the ER team was concerned about unstable angina and initiated heparin gtt and consulted cardiology. She tells me she just hasn't felt like herself over the last few days. She has some fatigue, some body aches but no fever, no chills. Some cough, nonproductive. She has no recent travel. 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 No prior tobacco. Some family history of CAD/CABG/HF in father. She tells me she had a large aspirin already. In February she had an admission and had an elevated troponin but normal echo and troponin thought related to hypotensive episode.    Past Medical History  Diagnosis Date  . CVA (cerebral infarction)   . Hypertension   . GERD (gastroesophageal reflux disease)   . Hyperlipidemia     Past Surgical History  Procedure Laterality Date  . Cesarean section      Family History  Problem Relation Age of Onset  . Stroke Father   . Heart failure Father    Social History:  reports that she has never smoked. She has never used smokeless tobacco. She reports that she drinks alcohol. She reports that she does not use illicit drugs.  Allergies: No Known Allergies   (Not in a hospital admission)  Results for orders placed during the hospital encounter of 10/27/13 (from the past 48 hour(s))  CBC     Status: None   Collection Time    10/27/13  6:31 PM       Result Value Range   WBC 5.6  4.0 - 10.5 K/uL   RBC 4.19  3.87 - 5.11 MIL/uL   Hemoglobin 14.0  12.0 - 15.0 g/dL   HCT 16.1  09.6 - 04.5 %   MCV 95.9  78.0 - 100.0 fL   MCH 33.4  26.0 - 34.0 pg   MCHC 34.8  30.0 - 36.0 g/dL   RDW 40.9  81.1 - 91.4 %   Platelets 183  150 - 400 K/uL  PRO B NATRIURETIC PEPTIDE     Status: None   Collection Time    10/27/13  6:31 PM      Result Value Range   Pro B Natriuretic peptide (BNP) 48.8  0 - 125 pg/mL  BASIC METABOLIC PANEL     Status: Abnormal   Collection Time    10/27/13  6:31 PM      Result Value Range   Sodium 139  135 - 145 mEq/L   Potassium 4.0  3.5 - 5.1 mEq/L   Chloride 98  96 - 112 mEq/L   CO2 32  19 - 32 mEq/L   Glucose, Bld 111 (*) 70 - 99 mg/dL   BUN 12  6 - 23 mg/dL   Creatinine, Ser 7.82  0.50 - 1.10 mg/dL   Calcium 95.6  8.4 - 21.3 mg/dL   GFR calc non Af Amer >90  >90  mL/min   GFR calc Af Amer >90  >90 mL/min   Comment: (NOTE)     The eGFR has been calculated using the CKD EPI equation.     This calculation has not been validated in all clinical situations.     eGFR's persistently <90 mL/min signify possible Chronic Kidney     Disease.  TROPONIN I     Status: None   Collection Time    10/27/13  8:10 PM      Result Value Range   Troponin I <0.30  <0.30 ng/mL   Comment:            Due to the release kinetics of cTnI,     a negative result within the first hours     of the onset of symptoms does not rule out     myocardial infarction with certainty.     If myocardial infarction is still suspected,     repeat the test at appropriate intervals.   Dg Chest 2 View  10/27/2013   CLINICAL DATA:  Shortness of breath, palpitations  EXAM: CHEST  2 VIEW  COMPARISON:  02/02/2013  FINDINGS: Cardiomediastinal silhouette is stable. No acute infiltrate or pleural effusion. No pulmonary edema. Mild degenerative changes mid thoracic spine.  IMPRESSION: No active cardiopulmonary disease.  No significant change.   Electronically  Signed   By: Natasha Mead M.D.   On: 10/27/2013 18:55    Review of Systems  Constitutional: Positive for malaise/fatigue. Negative for fever, chills and weight loss.  HENT: Negative for ear pain and tinnitus.   Eyes: Negative for double vision, photophobia and pain.  Respiratory: Positive for cough and shortness of breath. Negative for hemoptysis, sputum production and wheezing.   Cardiovascular: Positive for leg swelling. Negative for chest pain, palpitations, orthopnea and PND.       Intermittent; none currently; hands/wrist also at times  Gastrointestinal: Negative for nausea, vomiting and abdominal pain.  Genitourinary: Negative for dysuria, urgency and frequency.  Musculoskeletal: Negative for back pain and neck pain.  Skin: Negative for itching and rash.  Neurological: Negative for dizziness, tingling and sensory change.  Endo/Heme/Allergies: Negative for environmental allergies. Does not bruise/bleed easily.  Psychiatric/Behavioral: Negative for depression, hallucinations and substance abuse.    Blood pressure 142/78, pulse 54, temperature 98.1 F (36.7 C), temperature source Oral, resp. rate 20, height 5\' 2"  (1.575 m), weight 87.816 kg (193 lb 9.6 oz), SpO2 98.00%. Physical Exam  Nursing note and vitals reviewed. Constitutional: She is oriented to person, place, and time. She appears well-developed and well-nourished. No distress.  HENT:  Head: Normocephalic and atraumatic.  Nose: Nose normal.  Mouth/Throat: Oropharynx is clear and moist. No oropharyngeal exudate.  Eyes: Conjunctivae and EOM are normal. Pupils are equal, round, and reactive to light. No scleral icterus.  Neck: Normal range of motion. Neck supple. No JVD present. No tracheal deviation present. No thyromegaly present.  Cardiovascular: Normal rate, regular rhythm, normal heart sounds and intact distal pulses.  Exam reveals no gallop.   No murmur heard. Respiratory: Effort normal and breath sounds normal. No  respiratory distress. She has no wheezes. She has no rales.  GI: Soft. Bowel sounds are normal. She exhibits no distension. There is no tenderness. There is no rebound.  Musculoskeletal: Normal range of motion. She exhibits no edema and no tenderness.  Neurological: She is alert and oriented to person, place, and time. No cranial nerve deficit. Coordination normal.  Skin: Skin is warm and dry. No rash  noted. She is not diaphoretic. No erythema.  Psychiatric: She has a normal mood and affect. Her behavior is normal. Thought content normal.    Labs reviewed; h/h 14/40, plt 183, wbc 5.6, plt 183, na 139, K 4.0, bun/cr 12/0.54 Chest x-ray no acute process EKG: NSR, HR 70s, nl axis Echo from 02/02/13 reviewed; EF 55-60%, RVSP 30s Troponin of 0.77 noted on 02/02/13 with brief hospital stay  Problem List Dyspnea on Exertion Prior CVA Hypertension Dyslipidemia GERD  Assessment/Plan Diane [name] is a 62 yo woman with PMH of CVA 12 years ago, hypertension, GERD and dyslipidemia followed by PCP Dr. Dayton Martes who has had dyspnea on exertion progressing to the point of walking as few as 10 feet. Differential is pulmonary embolism, heart failure, myocarditis, viral syndrome, unstable angina among other etiologies. She has some viral-like syndrome symptoms but her JVD appears normal, fairly normal chest x-ray and feels less SOB lying flat. She's not tachycardic and has unremarkable ECG. Normal saturation. My pretest probability for PE is low. She has symptoms that could be consistent with myocarditis or heart failure but no corresponding physical examination findings. Her SOB is worse with exertion, improves with rest and she has family history of CAD. I favor diagnosis of unstable angina at this time given clinical syndrome and history of elevated troponin in February in setting of demand that suggests some underlying CAD in retrospective given today's symptomology. ER already started heparin gtt so will continue.  Initial troponin negative. Will trend troponins, evaluate thyroid, BNP, lipid levels among others. NPO after MN for potential LHC unless other diagnosis becomes more likely. I discussed the plan extensively with the family including risks of cardiac catheterization but also discussed that other findings may suggest alternative diagnosis overnight.  - telemetry, trend cardiac markers - heparin gtt initiated in ER - NPO after MN - tsh, bnp, lipid panel - defer Echo unless BNP significantly elevated - continue aspirin 81 mg daily - PPI - atorvastatin 80 mg qHS, first dose now  Smokey Melott 10/27/2013, 9:18 PM

## 2013-10-27 NOTE — ED Notes (Addendum)
Pt was told to come here by her pcp.  For the last 4 days pt has been feeling increasingly sob, esp on ambulation and has been noticing edema to extremities.  Denies hx of chf and denies chest pain.  Also c/o headache.

## 2013-10-27 NOTE — ED Notes (Signed)
Cardiologist at the bedside

## 2013-10-27 NOTE — Progress Notes (Signed)
ANTICOAGULATION CONSULT NOTE - Initial Consult  Pharmacy Consult for heparin Indication: chest pain/ACS  No Known Allergies  Patient Measurements: Height: 5\' 2"  (157.5 cm) Weight: 193 lb 9.6 oz (87.816 kg) IBW/kg (Calculated) : 50.1 Heparin Dosing Weight: 70.2kg  Vital Signs: Temp: 98.1 F (36.7 C) (10/30 1819) Temp src: Oral (10/30 1819) BP: 142/78 mmHg (10/30 2032) Pulse Rate: 54 (10/30 2032)  Labs:  Recent Labs  10/27/13 1831 10/27/13 2010  HGB 14.0  --   HCT 40.2  --   PLT 183  --   CREATININE 0.54  --   TROPONINI  --  <0.30    Estimated Creatinine Clearance: 76 ml/min (by C-G formula based on Cr of 0.54).   Medical History: Past Medical History  Diagnosis Date  . CVA (cerebral infarction)   . Hypertension   . GERD (gastroesophageal reflux disease)   . Hyperlipidemia     Medications:  Infusions:  . heparin    . heparin      Assessment: 24 yof presented to the ED with increasing SOB and lower extremity edema. Baseline CBC is WNL, troponin is negative so far, BNP is WNL. She is not taking any anticoagulants PTA.   Goal of Therapy:  Heparin level 0.3-0.7 units/ml Monitor platelets by anticoagulation protocol: Yes   Plan:  1. Heparin  Bolus 4000 units IV x 1 2. Heparin gtt 900 units/hr 3. Check a 6 hour heparin level 4. Daily heparin level and CBC  Romolo Sieling, Drake Leach 10/27/2013,9:10 PM

## 2013-10-27 NOTE — ED Provider Notes (Signed)
CSN: [id number]     Arrival date & time 10/27/13  1811 History   First MD Initiated Contact with Patient 10/27/13 1956     Chief Complaint  Patient presents with  . Palpitations  . fluid retention   . Shortness of Breath   (Consider location/radiation/quality/duration/timing/severity/associated sxs/prior Treatment) The history is provided by the patient. No language interpreter was used.   Patient is a 62 y.o. Caucasian female with past medical history of CVA who comes emergency department today with shortness of breath for the past 4 days. Shortness of breath occurs with she is ambulating. 4 days ago it began when she was walking into work. Shortness of breath resolved when she sat down. For the past 4 days has taken progressively less and less exertion to cause her to become short of breath. With shortness of breath she has diaphoresis. She denies chest pain with any of these incidents. Today she walked approximately 10-15 steps became significantly short of breath and diaphoretic. This point to call primary care physician who instructed her to go to the emergency department for evaluation. The incident occurred approximately 5:00 this afternoon. She has not ambulated since then has had no repeat episodes.   Past Medical History  Diagnosis Date  . CVA (cerebral infarction)   . Hypertension   . GERD (gastroesophageal reflux disease)   . Hyperlipidemia    Past Surgical History  Procedure Laterality Date  . Cesarean section     Family History  Problem Relation Age of Onset  . Stroke Father   . Heart failure Father    History  Substance Use Topics  . Smoking status: Never Smoker   . Smokeless tobacco: Never Used     Comment: LIVES WITH 2 SMOKERS   . Alcohol Use: Yes     Comment: Rare social ETOH   OB History   Grav Para Term Preterm Abortions TAB SAB Ect Mult Living                 Review of Systems  Constitutional: Negative for fever and chills.  Respiratory: Positive  for cough and shortness of breath. Negative for wheezing.   Cardiovascular: Negative for chest pain.  Gastrointestinal: Negative for nausea, vomiting, diarrhea and constipation.  Genitourinary: Negative for dysuria, urgency and frequency.  Skin: Negative for color change and wound.  All other systems reviewed and are negative.    Allergies  Review of patient's allergies indicates no known allergies.  Home Medications   Current Outpatient Rx  Name  Route  Sig  Dispense  Refill  . benazepril-hydrochlorthiazide (LOTENSIN HCT) 20-25 MG per tablet      Take 1 tablet by mouth  daily   90 tablet   3   . butalbital-acetaminophen-caffeine (FIORICET WITH CODEINE) 50-325-40-30 MG per capsule   Oral   Take 1-2 capsules by mouth daily as needed. PRN pain   30 capsule   0   . cholecalciferol (VITAMIN D) 1000 UNITS tablet   Oral   Take 1,000 Units by mouth daily.         . citalopram (CELEXA) 40 MG tablet   Oral   Take 40 mg by mouth at bedtime.         [name] esomeprazole (NEXIUM) 40 MG capsule   Oral   Take 40 mg by mouth daily as needed.          . Nutritional Supplements (ESTROVEN PO)   Oral   Take 1 capsule by mouth daily.         [name]  pravastatin (PRAVACHOL) 40 MG tablet   Oral   Take 1 tablet (40 mg total) by mouth daily.   30 tablet   6   . aspirin 81 MG EC tablet   Oral   Take 1 tablet (81 mg total) by mouth daily. Swallow whole.   30 tablet   12    BP 156/90  Pulse 76  Temp(Src) 98.1 F (36.7 C) (Oral)  Resp 16  Ht 5\' 2"  (1.575 m)  Wt 193 lb 9.6 oz (87.816 kg)  BMI 35.4 kg/m2  SpO2 98% Physical Exam  Nursing note and vitals reviewed. Constitutional: She is oriented to person, place, and time. She appears well-developed and well-nourished. No distress.  HENT:  Head: Normocephalic and atraumatic.  Eyes: Pupils are equal, round, and reactive to light.  Neck: Normal range of motion.  Cardiovascular: Normal rate, regular rhythm, normal heart sounds and  intact distal pulses.   Pulmonary/Chest: Effort normal. No respiratory distress. She has no wheezes. She exhibits no tenderness.  Abdominal: Soft. Bowel sounds are normal. She exhibits no distension. There is no tenderness. There is no rebound and no guarding.  Neurological: She is alert and oriented to person, place, and time. She has normal strength. No cranial nerve deficit or sensory deficit. She exhibits normal muscle tone. Coordination and gait normal.  Skin: Skin is warm and dry.    ED Course  Procedures (including critical care time) Labs Review Labs Reviewed  BASIC METABOLIC PANEL - Abnormal; Notable for the following:    Glucose, Bld 111 (*)    All other components within normal limits  CBC  PRO B NATRIURETIC PEPTIDE  TROPONIN I  HEPARIN LEVEL (UNFRACTIONATED)  CBC   Imaging Review Dg Chest 2 View  10/27/2013   CLINICAL DATA:  Shortness of breath, palpitations  EXAM: CHEST  2 VIEW  COMPARISON:  02/02/2013  FINDINGS: Cardiomediastinal silhouette is stable. No acute infiltrate or pleural effusion. No pulmonary edema. Mild degenerative changes mid thoracic spine.  IMPRESSION: No active cardiopulmonary disease.  No significant change.   Electronically Signed   By: Natasha Mead M.D.   On: 10/27/2013 18:55    EKG Interpretation   None       MDM  Patient is a 62 year old Caucasian female with past medical history of CVA who comes emergency department today with shortness of breath with exertion. Physical exam as above. Initial history is concerning for unstable angina. She was started on heparin drip. Initial workup included a CBC, BMP, BNP, troponin, chest x-ray, and EKG.  EKG negative for STEMI.  BMP was unremarkable. BNP was 48. CBC was unremarkable. Troponin was negative. Chest x-ray demonstrated no consolidations no pulmonary edema. As a result doubt pneumonia or CHF.  History is very concerning for unstable angina with shortness of breath is an anginal equivalent.  She was  felt to require admission to cardiology for unstable angina. She took an aspirin earlier today was not felt to require aspirin this time. Patient was admitted to cardiology in stable condition. Labs and imaging reviewed by myself and considered and medical decision-making. Imaging was interpreted by radiology. Care was discussed with my attending Dr. Rubin Payor.   1. Unstable angina     Bethann Berkshire, MD 10/27/13 2328

## 2013-10-28 ENCOUNTER — Ambulatory Visit (HOSPITAL_COMMUNITY): Payer: 59

## 2013-10-28 ENCOUNTER — Telehealth: Payer: Self-pay | Admitting: Family Medicine

## 2013-10-28 ENCOUNTER — Encounter (HOSPITAL_COMMUNITY)
Admission: EM | Disposition: A | Discharge status: Home / Self Care | Payer: Self-pay | Source: Home / Self Care | Attending: Emergency Medicine

## 2013-10-28 DIAGNOSIS — I200 Unstable angina: Secondary | ICD-10-CM

## 2013-10-28 DIAGNOSIS — Z8673 Personal history of transient ischemic attack (TIA), and cerebral infarction without residual deficits: Secondary | ICD-10-CM

## 2013-10-28 DIAGNOSIS — E785 Hyperlipidemia, unspecified: Secondary | ICD-10-CM | POA: Insufficient documentation

## 2013-10-28 DIAGNOSIS — R0609 Other forms of dyspnea: Secondary | ICD-10-CM | POA: Insufficient documentation

## 2013-10-28 DIAGNOSIS — E782 Mixed hyperlipidemia: Secondary | ICD-10-CM | POA: Insufficient documentation

## 2013-10-28 DIAGNOSIS — I2510 Atherosclerotic heart disease of native coronary artery without angina pectoris: Secondary | ICD-10-CM

## 2013-10-28 HISTORY — PX: LEFT HEART CATHETERIZATION WITH CORONARY ANGIOGRAM: SHX5451

## 2013-10-28 LAB — PRO B NATRIURETIC PEPTIDE
Pro B Natriuretic peptide (BNP): 34.2 pg/mL (ref 0–125)
Pro B Natriuretic peptide (BNP): 32.7 pg/mL (ref 0–125)

## 2013-10-28 LAB — LIPID PANEL
Cholesterol: 249 mg/dL — ABNORMAL HIGH (ref 0–200)
HDL: 63 mg/dL (ref >39)
LDL Cholesterol: 137 mg/dL — ABNORMAL HIGH (ref 0–99)
Total CHOL/HDL Ratio: 4 RATIO
Triglycerides: 243 mg/dL — ABNORMAL HIGH (ref <150)

## 2013-10-28 LAB — COMPREHENSIVE METABOLIC PANEL
ALT: 43 U/L — ABNORMAL HIGH (ref 0–35)
AST: 31 U/L (ref 0–37)
Albumin: 4 g/dL (ref 3.5–5.2)
Albumin: 3.8 g/dL (ref 3.5–5.2)
Alkaline Phosphatase: 63 U/L (ref 39–117)
Alkaline Phosphatase: 61 U/L (ref 39–117)
BUN: 10 mg/dL (ref 6–23)
BUN: 9 mg/dL (ref 6–23)
CO2: 28 mEq/L (ref 19–32)
CO2: 28 mEq/L (ref 19–32)
Calcium: 9.8 mg/dL (ref 8.4–10.5)
Chloride: 101 mEq/L (ref 96–112)
Chloride: 103 mEq/L (ref 96–112)
Creatinine, Ser: 0.55 mg/dL (ref 0.50–1.10)
Creatinine, Ser: 0.58 mg/dL (ref 0.50–1.10)
GFR calc Af Amer: >90 mL/min (ref >90)
GFR calc non Af Amer: >90 mL/min (ref >90)
Glucose, Bld: 115 mg/dL — ABNORMAL HIGH (ref 70–99)
Potassium: 3.7 mEq/L (ref 3.5–5.1)
Potassium: 4.2 mEq/L (ref 3.5–5.1)
Sodium: 140 mEq/L (ref 135–145)
Total Bilirubin: 0.2 mg/dL — ABNORMAL LOW (ref 0.3–1.2)
Total Bilirubin: 0.2 mg/dL — ABNORMAL LOW (ref 0.3–1.2)
Total Protein: 7 g/dL (ref 6.0–8.3)

## 2013-10-28 LAB — APTT: aPTT: 83 seconds — ABNORMAL HIGH (ref 24–37)

## 2013-10-28 LAB — CBC
HCT: 35.9 % — ABNORMAL LOW (ref 36.0–46.0)
Hemoglobin: 12.7 g/dL (ref 12.0–15.0)
MCHC: 35.4 g/dL (ref 30.0–36.0)
MCV: 94.7 fL (ref 78.0–100.0)
Platelets: 159 10*3/uL (ref 150–400)
RBC: 3.79 MIL/uL — ABNORMAL LOW (ref 3.87–5.11)
RDW: 12.2 % (ref 11.5–15.5)
WBC: 5.1 10*3/uL (ref 4.0–10.5)

## 2013-10-28 LAB — PROTIME-INR
INR: 0.94 (ref 0.00–1.49)
INR: 0.94 (ref 0.00–1.49)
Prothrombin Time: 12.4 seconds (ref 11.6–15.2)
Prothrombin Time: 12.4 seconds (ref 11.6–15.2)

## 2013-10-28 LAB — MAGNESIUM
Magnesium: 2 mg/dL (ref 1.5–2.5)
Magnesium: 2 mg/dL (ref 1.5–2.5)

## 2013-10-28 LAB — HEPARIN LEVEL (UNFRACTIONATED): Heparin Unfractionated: 0.28 IU/mL — ABNORMAL LOW (ref 0.30–0.70)

## 2013-10-28 LAB — HEMOGLOBIN A1C
Hgb A1c MFr Bld: 5.2 % (ref <5.7)
Mean Plasma Glucose: 103 mg/dL (ref <117)

## 2013-10-28 LAB — TROPONIN I
Troponin I: <0.3 ng/mL (ref <0.30)
Troponin I: <0.3 ng/mL (ref <0.30)
Troponin I: <0.3 ng/mL (ref <0.30)

## 2013-10-28 SURGERY — LEFT HEART CATHETERIZATION WITH CORONARY ANGIOGRAM
Anesthesia: LOCAL

## 2013-10-28 MED ORDER — TECHNETIUM TO 99M ALBUMIN AGGREGATED
6.0000 | Freq: Once | INTRAVENOUS | Status: AC | PRN
  Administered 2013-10-28: 6 via INTRAVENOUS

## 2013-10-28 MED ORDER — SODIUM CHLORIDE 0.9 % IJ SOLN: 3.0000 mL | INTRAMUSCULAR | Status: DC | PRN

## 2013-10-28 MED ORDER — TECHNETIUM TC 99M DIETHYLENETRIAME-PENTAACETIC ACID: 40.0000 | Freq: Once | INTRAVENOUS | Status: DC | PRN

## 2013-10-28 MED ORDER — HEPARIN (PORCINE) IN NACL 2-0.9 UNIT/ML-% IJ SOLN
INTRAMUSCULAR | Status: AC
  Filled 2013-10-28: qty 1000

## 2013-10-28 MED ORDER — ASPIRIN 81 MG PO CHEW: 81.0000 mg | CHEWABLE_TABLET | ORAL | Status: DC

## 2013-10-28 MED ORDER — ONDANSETRON HCL 4 MG/2ML IJ SOLN: 4.0000 mg | Freq: Four times a day (QID) | INTRAMUSCULAR | Status: DC | PRN

## 2013-10-28 MED ORDER — MIDAZOLAM HCL 2 MG/2ML IJ SOLN
INTRAMUSCULAR | Status: AC
  Filled 2013-10-28: qty 2

## 2013-10-28 MED ORDER — FENTANYL CITRATE 0.05 MG/ML IJ SOLN
INTRAMUSCULAR | Status: AC
  Filled 2013-10-28: qty 2

## 2013-10-28 MED ORDER — SODIUM CHLORIDE 0.9 % IJ SOLN: 3.0000 mL | Freq: Two times a day (BID) | INTRAMUSCULAR | Status: DC

## 2013-10-28 MED ORDER — SODIUM CHLORIDE 0.9 % IV SOLN: INTRAVENOUS | Status: DC

## 2013-10-28 MED ORDER — ASPIRIN EC 81 MG PO TBEC: 81.0000 mg | DELAYED_RELEASE_TABLET | Freq: Every day | ORAL | Status: DC

## 2013-10-28 MED ORDER — SODIUM CHLORIDE 0.9 % IV SOLN: 250.0000 mL | INTRAVENOUS | Status: DC | PRN

## 2013-10-28 MED ORDER — DIAZEPAM 5 MG PO TABS
5.0000 mg | ORAL_TABLET | ORAL | Status: AC
  Administered 2013-10-28: 5 mg via ORAL
  Filled 2013-10-28: qty 1

## 2013-10-28 MED ORDER — SODIUM CHLORIDE 0.9 % IV SOLN
INTRAVENOUS | Status: DC
  Administered 2013-10-28: 09:00:00 via INTRAVENOUS

## 2013-10-28 MED ORDER — NITROGLYCERIN 0.2 MG/ML ON CALL CATH LAB
INTRAVENOUS | Status: AC
  Filled 2013-10-28: qty 1

## 2013-10-28 MED ORDER — ACETAMINOPHEN 325 MG PO TABS: 650.0000 mg | ORAL_TABLET | ORAL | Status: DC | PRN

## 2013-10-28 MED ORDER — HEPARIN SODIUM (PORCINE) 5000 UNIT/ML IJ SOLN
5000.0000 [IU] | Freq: Three times a day (TID) | INTRAMUSCULAR | Status: DC
  Filled 2013-10-28 (×2): qty 1

## 2013-10-28 MED ORDER — LIDOCAINE HCL (PF) 1 % IJ SOLN
INTRAMUSCULAR | Status: AC
  Filled 2013-10-28: qty 30

## 2013-10-28 MED ORDER — ASPIRIN 81 MG PO CHEW
81.0000 mg | CHEWABLE_TABLET | ORAL | Status: AC
  Administered 2013-10-28: 81 mg via ORAL
  Filled 2013-10-28: qty 1

## 2013-10-28 NOTE — Interval H&P Note (Signed)
Cath Lab Visit (complete for each Cath Lab visit)  Clinical Evaluation Leading to the Procedure:   ACS: yes  Non-ACS:    Anginal Classification: CCS IV  Anti-ischemic medical therapy: No Therapy  Non-Invasive Test Results: No non-invasive testing performed  Prior CABG: No previous CABG      History and Physical Interval Note:  10/28/2013 9:35 AM  [name]  has presented today for surgery, with the diagnosis of cp  The various methods of treatment have been discussed with the patient and family. After consideration of risks, benefits and other options for treatment, the patient has consented to  Procedure(s): LEFT HEART CATHETERIZATION WITH CORONARY ANGIOGRAM (N/A) as a surgical intervention .  The patient's history has been reviewed, patient examined, no change in status, stable for surgery.  I have reviewed the patient's chart and labs.  Questions were answered to the patient's satisfaction.     Kannon Baum [employer]

## 2013-10-28 NOTE — Telephone Encounter (Signed)
[facility] Triage Call Report Triage Record Num: [id number] Operator: Jeraldine Loots Patient Name: Bobbijo Holst Call Date & Time: 10/27/2013 4:48:08PM Patient Phone: [phone] PCP: Patient Gender: Female PCP Fax : Patient DOB: 03-Sep-1951 Practice Name: [facility] Reason for Call: Caller: Talya/Patient; PCP: Ruthe Mannan (Family Practice); CB#: [phone]; Call regarding a cough that starts in early am and lasts until about 12noon daily x 4 days. The cough is nonproductive. She has fatigue and shortness of breath with fatigue. She feels like she has swelling in her abdomen, her feet are slightly swollen. Traiged per Cough-Adult. Needs to be seen in ED and sent there this afternoon (no appts available in evening clinic). "New or worsening cough AND known cardiac or respiratory condition not responding to treatment". Protocol(s) Used: Cough - Adult Recommended Outcome per Protocol: See ED Immediately Reason for Outcome: New or worsening cough AND known cardiac or respiratory condition not responding to treatment OR treatment not available Care Advice: ~ 10/27/2013 4:59:04PM Page 1 of 1 [email or web address]

## 2013-10-28 NOTE — H&P (View-Only) (Signed)
Patient ID: Diane [name], female   DOB: 01/20/1951, 62 y.o.   MRN: [id number]    SUBJECTIVE:  Patient is stable this morning. She is comfortable in bed.  Filed Vitals:   10/27/13 2230 10/27/13 2344 10/27/13 2348 10/28/13 0426  BP: 132/50  148/72 121/76  Pulse: 77  83 79  Temp:   97.8 F (36.6 C) 98.6 F (37 C)  TempSrc:   Oral   Resp: 17  20 18  Height:   5' 2" (1.575 m)   Weight:   189 lb 9.6 oz (86.002 kg) 189 lb 9.5 oz (86 kg)  SpO2: 99% 99% 99% 96%    No intake or output data in the 24 hours ending 10/28/13 0723  LABS: Basic Metabolic Panel:  Recent Labs  10/28/13 0030 10/28/13 0400  NA 140 141  K 3.7 4.2  CL 101 103  CO2 28 28  GLUCOSE 115* 94  BUN 10 9  CREATININE 0.55 0.58  CALCIUM 9.8 9.4  MG 2.0 2.0   Liver Function Tests:  Recent Labs  10/28/13 0030 10/28/13 0400  AST 36 31  ALT 43* 40*  ALKPHOS 63 61  BILITOT 0.2* 0.2*  PROT 7.0 6.6  ALBUMIN 4.0 3.8   No results found for this basename: LIPASE, AMYLASE,  in the last 72 hours CBC:  Recent Labs  10/27/13 1831 10/28/13 0400  WBC 5.6 5.1  HGB 14.0 12.7  HCT 40.2 35.9*  MCV 95.9 94.7  PLT 183 159   Cardiac Enzymes:  Recent Labs  10/27/13 2010 10/28/13 0030 10/28/13 0400  TROPONINI <0.30 <0.30 <0.30   BNP: No components found with this basename: POCBNP,  D-Dimer: No results found for this basename: DDIMER,  in the last 72 hours Hemoglobin A1C: No results found for this basename: HGBA1C,  in the last 72 hours Fasting Lipid Panel:  Recent Labs  10/28/13 0400  CHOL 249*  HDL 63  LDLCALC 137*  TRIG 243*  CHOLHDL 4.0   Thyroid Function Tests: No results found for this basename: TSH, T4TOTAL, FREET3, T3FREE, THYROIDAB,  in the last 72 hours  RADIOLOGY: Dg Chest 2 View  10/27/2013   CLINICAL DATA:  Shortness of breath, palpitations  EXAM: CHEST  2 VIEW  COMPARISON:  02/02/2013  FINDINGS: Cardiomediastinal silhouette is stable. No acute infiltrate or pleural effusion.  No pulmonary edema. Mild degenerative changes mid thoracic spine.  IMPRESSION: No active cardiopulmonary disease.  No significant change.   Electronically Signed   By: Liviu  Pop M.D.   On: 10/27/2013 18:55    PHYSICAL EXAM    Patient is stable this morning she is flat in bed. There is no jugulovenous distention. Lungs are clear. Respiratory effort is not labored. Cardiac exam reveals S1 and S2. The abdomen is soft. There is no peripheral edema   ASSESSMENT AND PLAN:    Unstable angina    Troponins are normal so far. At this point the etiology of the patient's presenting symptoms is not clear. Because she had shortness of breath and diaphoresis with very limited exertion, she is being treated as possible unstable angina. We will proceed with cardiac catheterization today. I discussed this with her and she is in agreement. Up to this point it has been felt that pulmonary emboli are lower on the potential differential diagnosis. However, if catheterization is normal, he will seem prudent to proceed with CT of the chest to be sure that she does not have pulmonary emboli. She has already heparinized. In   addition I will send a d-dimer this morning.    GERD (gastroesophageal reflux disease)   Hypertension   History of CVA (cerebrovascular accident)   Dyslipidemia    Dyspnea on exertion     This was her main complaint on admission. There is concern that this could be an anginal:. Physical exam and chest x-ray do not show CHF. BNP is not elevated.  I called the cath lab to put her on the schedule. I started her IV fluids and wrote her cath orders.  Jeffrey Katz 10/28/2013 7:23 AM  

## 2013-10-28 NOTE — Progress Notes (Addendum)
Patient ID: Diane [name], female   DOB: [birth year], 62 y.o.   MRN: [id number]    SUBJECTIVE:  Patient is stable this morning. She is comfortable in bed.  Filed Vitals:   10/27/13 2230 10/27/13 2344 10/27/13 2348 10/28/13 0426  BP: 132/50  148/72 121/76  Pulse: 77  83 79  Temp:   97.8 F (36.6 C) 98.6 F (37 C)  TempSrc:   Oral   Resp: 17  20 18   Height:   5\' 2"  (1.575 m)   Weight:   189 lb 9.6 oz (86.002 kg) 189 lb 9.5 oz (86 kg)  SpO2: 99% 99% 99% 96%    No intake or output data in the 24 hours ending 10/28/13 0723  LABS: Basic Metabolic Panel:  Recent Labs  78/29/56 0030 10/28/13 0400  NA 140 141  K 3.7 4.2  CL 101 103  CO2 28 28  GLUCOSE 115* 94  BUN 10 9  CREATININE 0.55 0.58  CALCIUM 9.8 9.4  MG 2.0 2.0   Liver Function Tests:  Recent Labs  10/28/13 0030 10/28/13 0400  AST 36 31  ALT 43* 40*  ALKPHOS 63 61  BILITOT 0.2* 0.2*  PROT 7.0 6.6  ALBUMIN 4.0 3.8   No results found for this basename: LIPASE, AMYLASE,  in the last 72 hours CBC:  Recent Labs  10/27/13 1831 10/28/13 0400  WBC 5.6 5.1  HGB 14.0 12.7  HCT 40.2 35.9*  MCV 95.9 94.7  PLT 183 159   Cardiac Enzymes:  Recent Labs  10/27/13 2010 10/28/13 0030 10/28/13 0400  TROPONINI <0.30 <0.30 <0.30   BNP: No components found with this basename: POCBNP,  D-Dimer: No results found for this basename: DDIMER,  in the last 72 hours Hemoglobin A1C: No results found for this basename: HGBA1C,  in the last 72 hours Fasting Lipid Panel:  Recent Labs  10/28/13 0400  CHOL 249*  HDL 63  LDLCALC 137*  TRIG 243*  CHOLHDL 4.0   Thyroid Function Tests: No results found for this basename: TSH, T4TOTAL, FREET3, T3FREE, THYROIDAB,  in the last 72 hours  RADIOLOGY: Dg Chest 2 View  10/27/2013   CLINICAL DATA:  Shortness of breath, palpitations  EXAM: CHEST  2 VIEW  COMPARISON:  02/02/2013  FINDINGS: Cardiomediastinal silhouette is stable. No acute infiltrate or pleural effusion.  No pulmonary edema. Mild degenerative changes mid thoracic spine.  IMPRESSION: No active cardiopulmonary disease.  No significant change.   Electronically Signed   By: Natasha Mead M.D.   On: 10/27/2013 18:55    PHYSICAL EXAM    Patient is stable this morning she is flat in bed. There is no jugulovenous distention. Lungs are clear. Respiratory effort is not labored. Cardiac exam reveals S1 and S2. The abdomen is soft. There is no peripheral edema   ASSESSMENT AND PLAN:    Unstable angina    Troponins are normal so far. At this point the etiology of the patient's presenting symptoms is not clear. Because she had shortness of breath and diaphoresis with very limited exertion, she is being treated as possible unstable angina. We will proceed with cardiac catheterization today. I discussed this with her and she is in agreement. Up to this point it has been felt that pulmonary emboli are lower on the potential differential diagnosis. However, if catheterization is normal, he will seem prudent to proceed with CT of the chest to be sure that she does not have pulmonary emboli. She has already heparinized. In  addition I will send a d-dimer this morning.    GERD (gastroesophageal reflux disease)   Hypertension   History of CVA (cerebrovascular accident)   Dyslipidemia    Dyspnea on exertion     This was her main complaint on admission. There is concern that this could be an anginal:. Physical exam and chest x-ray do not show CHF. BNP is not elevated.  I called the cath lab to put her on the schedule. I started her IV fluids and wrote her cath orders.  Willa Rough 10/28/2013 7:23 AM

## 2013-10-28 NOTE — CV Procedure (Signed)
    Cardiac Catheterization Procedure Note  Name: Diane [name] MRN: [id number] DOB: [birth year]  Procedure: Left Heart Cath, Selective Coronary Angiography, LV angiography  Indication: Unstable angina   Procedural Details: Allen's test was positive on the right wrist.  The right wrist was prepped, draped, and anesthetized with 1% lidocaine. Using the modified Seldinger technique, a 5 French sheath was introduced into the right radial artery. 3 mg of verapamil was administered through the sheath, weight-based unfractionated heparin was administered intravenously. Standard Judkins catheters were used for selective coronary angiography and left ventriculography. Catheter exchanges were performed over an exchange length guidewire. There were no immediate procedural complications. A TR band was used for radial hemostasis at the completion of the procedure.  The patient was transferred to the post catheterization recovery area for further monitoring.  Procedural Findings: Hemodynamics: AO 130/85 LV 133/14  Coronary angiography: Coronary dominance: right  Left mainstem: No significant disease.   Left anterior descending (LAD): 30% proximal LAD stenosis, 40% mid LAD stenosis.  LAD is large vessel wrapping around apex.   Left circumflex (LCx): Large system, no significant disease.   Right coronary artery (RCA): 30% proximal RCA stenosis.   Left ventriculography: Left ventricular systolic function is normal, LVEF is estimated at 55-60%, there is no significant mitral regurgitation, no regional wall motion abnormalities in the RAO projection.   Final Conclusions:  Nonobstructive CAD.  This does not explain chest.  Per Dr. Myrtis Ser, will arrange for PE evaluation with V/Q scan.  If this is normal, she can go home.   Diane [name] 10/28/2013, 10:17 AM

## 2013-10-28 NOTE — Discharge Summary (Signed)
CARDIOLOGY DISCHARGE SUMMARY   Patient ID: Diane [name] MRN: [id number] DOB/AGE: [birth year] 62 y.o.  Admit date: 10/27/2013 Discharge date: 10/28/2013  Primary Discharge Diagnosis:     Chest pain, mid sternal, possible unstable anginal pain - no critical coronary artery disease at cath, V/Q scan low prob  Secondary Discharge Diagnosis:    GERD (gastroesophageal reflux disease)   Hypertension   History of CVA (cerebrovascular accident)   Dyslipidemia   Dyspnea on exertion  Procedures: Left Heart Cath, Selective Coronary Angiography, LV angiography, VQ scan    Hospital Course: Diane [name] is a 62 y.o. female with no history of CAD. She had increasing shortness of breath and dyspnea on exertion with minimal ambulation. She also had bilateral lower extremity edema. She called her primary care physician and was sent to the emergency room. She was admitted for further evaluation and treatment.  Her cardiac enzymes were negative for MI. A lipid profile was reviewed and shows an increase in her LDL and triglycerides since her last check. She is encouraged to stick tightly to a low-cholesterol diet and followup with primary care.   Her initial symptoms were concerning for unstable/progressive anginal equivalent. Cardiac catheterization was indicated to further define her anatomy. This was performed on 10/28/2013.  Cardiac catheterization results are below. She had nonobstructive coronary artery disease and a preserved EF. Because her major symptom was shortness of breath, further evaluation to rule out PE was indicated. To avoid a second dye load, a VQ scan was performed.  The VQ scan was low probability for pulmonary embolus. Her oxygen saturation was within normal limits on room air. Dr. Myrtis Ser reviewed all available data. Once she had no critical coronary artery disease by cath and was low probability for PE by V/Q, no further inpatient workup was indicated. Diane [name] is  considered stable for discharge, to follow up as an outpatient. She will followup with cardiology initially after discharge and otherwise with primary care.  Labs:   Lab Results  Component Value Date   WBC 5.1 10/28/2013   HGB 12.7 10/28/2013   HCT 35.9* 10/28/2013   MCV 94.7 10/28/2013   PLT 159 10/28/2013     Recent Labs Lab 10/28/13 0400  NA 141  K 4.2  CL 103  CO2 28  BUN 9  CREATININE 0.58  CALCIUM 9.4  PROT 6.6  BILITOT 0.2*  ALKPHOS 61  ALT 40*  AST 31  GLUCOSE 94    Recent Labs  10/28/13 0030 10/28/13 0400 10/28/13 1230  TROPONINI <0.30 <0.30 <0.30   Lipid Panel     Component Value Date/Time   CHOL 249* 10/28/2013 0400   TRIG 243* 10/28/2013 0400   HDL 63 10/28/2013 0400   CHOLHDL 4.0 10/28/2013 0400   VLDL 49* 10/28/2013 0400   LDLCALC 137* 10/28/2013 0400   Pro B Natriuretic peptide (BNP)  Date/Time Value Range Status  10/28/2013  4:00 AM 32.7  0 - 125 pg/mL Final  10/28/2013 12:30 AM 34.2  0 - 125 pg/mL Final    Recent Labs  10/28/13 0400  INR 0.94      Radiology: Dg Chest 2 View 10/27/2013   CLINICAL DATA:  Shortness of breath, palpitations  EXAM: CHEST  2 VIEW  COMPARISON:  02/02/2013  FINDINGS: Cardiomediastinal silhouette is stable. No acute infiltrate or pleural effusion. No pulmonary edema. Mild degenerative changes mid thoracic spine.  IMPRESSION: No active cardiopulmonary disease.  No significant change.   Electronically Signed  By: Natasha Mead M.D.   On: 10/27/2013 18:55   Nm Pulmonary Perf And Vent 10/28/2013   CLINICAL DATA:  Chest pain with shortness of breath on exertion. Question pulmonary embolism.  EXAM: NUCLEAR MEDICINE VENTILATION - PERFUSION LUNG SCAN  TECHNIQUE: Ventilation images were obtained in multiple projections using inhaled aerosol technetium 99 M DTPA. Perfusion images were obtained in multiple projections after intravenous injection of Tc-85m MAA.  COMPARISON:  And chest radiographs 10/27/2013.   RADIOPHARMACEUTICALS:  40.0 mCi Tc-28m DTPA aerosol and 6 mCi Tc-68m MAA  FINDINGS: Ventilation: No focal ventilation defect. There is mild clumping and the tracheobronchial tree. Some free pertechnetate is noted within the stomach.  Perfusion: No wedge shaped peripheral perfusion defects to suggest acute pulmonary embolism. There are small peripheral perfusion defects consistent with obstructive lung disease.  IMPRESSION: Very low probability for acute pulmonary embolism. There is evidence of mild chronic obstructive pulmonary disease.   Electronically Signed   By: Roxy Horseman M.D.   On: 10/28/2013 14:33    Cardiac Cath: 10/28/2013 Left mainstem: No significant disease.  Left anterior descending (LAD): 30% proximal LAD stenosis, 40% mid LAD stenosis. LAD is large vessel wrapping around apex.  Left circumflex (LCx): Large system, no significant disease.  Right coronary artery (RCA): 30% proximal RCA stenosis.  Left ventriculography: Left ventricular systolic function is normal, LVEF is estimated at 55-60%, there is no significant mitral regurgitation, no regional wall motion abnormalities in the RAO projection.  Final Conclusions: Nonobstructive CAD.   ECG: 27-Oct-2013 18:17:14  Normal sinus rhythm, no acute ischemic changes Vent. rate 78 BPM PR interval 148 ms QRS duration 76 ms QT/QTc 380/433 ms P-R-T axes 58 23 57  FOLLOW UP PLANS AND APPOINTMENTS No Known Allergies   Medication List         aspirin 81 MG EC tablet  Take 1 tablet (81 mg total) by mouth daily. Swallow whole.     benazepril-hydrochlorthiazide 20-25 MG per tablet  Commonly known as:  LOTENSIN HCT  Take 1 tablet by mouth  daily     butalbital-acetaminophen-caffeine 50-325-40-30 MG per capsule  Commonly known as:  FIORICET WITH CODEINE  Take 1-2 capsules by mouth daily as needed. PRN pain     cholecalciferol 1000 UNITS tablet  Commonly known as:  VITAMIN D  Take 1,000 Units by mouth daily.     citalopram 40 MG  tablet  Commonly known as:  CELEXA  Take 40 mg by mouth at bedtime.     esomeprazole 40 MG capsule  Commonly known as:  NEXIUM  Take 40 mg by mouth daily as needed.     ESTROVEN PO  Take 1 capsule by mouth daily.     pravastatin 40 MG tablet  Commonly known as:  PRAVACHOL  Take 1 tablet (40 mg total) by mouth daily.         Future Appointments Provider Department Dept Phone   11/07/2013 4:15 PM Luis Abed, MD [facility] Office [phone]     Follow-up Information   Follow up with Willa Rough, MD On 11/07/2013. (at 4:15 pm)    Specialty:  Cardiology   Contact information:   [address] [place] [address] [phone]       BRING ALL MEDICATIONS WITH YOU TO FOLLOW UP APPOINTMENTS  Time spent with patient to include physician time: 32 min Signed: Theodore Demark, PA-C 10/28/2013, 4:36 PM Co-Sign MD

## 2013-10-28 NOTE — Progress Notes (Signed)
ANTICOAGULATION CONSULT NOTE - Follow Up Consult  Pharmacy Consult for Heparin  Indication: chest pain/ACS  No Known Allergies  Patient Measurements: Height: 5\' 2"  (157.5 cm) Weight: 189 lb 9.5 oz (86 kg) IBW/kg (Calculated) : 50.1 Heparin Dosing Weight: ~70 kg  Vital Signs: Temp: 98.6 F (37 C) (10/31 0426) Temp src: Oral (10/30 2348) BP: 121/76 mmHg (10/31 0426) Pulse Rate: 79 (10/31 0426)  Labs:  Recent Labs  10/27/13 1831 10/27/13 2010 10/28/13 0030 10/28/13 0400  HGB 14.0  --   --   --   HCT 40.2  --   --   --   PLT 183  --   --   --   APTT  --   --  83* 52*  LABPROT  --   --  12.4 12.4  INR  --   --  0.94 0.94  HEPARINUNFRC  --   --   --  0.28*  CREATININE 0.54  --  0.55  --   TROPONINI  --  <0.30 <0.30  --     Estimated Creatinine Clearance: 75.2 ml/min (by C-G formula based on Cr of 0.55).   Medications:  Heparin 900 units/hr  Assessment: 62 y/o F here with dyspnea on exertion. Troponin <0.3, unsure of plans for cath, HL is 0.28, CBC/renal function good, no overt bleeding noted.   Goal of Therapy:  Heparin level 0.3-0.7 units/ml Monitor platelets by anticoagulation protocol: Yes   Plan:  -Increase heparin drip to 1050 units/hr -6 hour HL at 1200 -Daily CBC/HL -Monitor for bleeding -F/U cardiology plans today  Thank you for allowing me to take part in this patient's care,  Abran Duke, PharmD Clinical Pharmacist Phone: [phone] Pager: [phone] 10/28/2013 5:46 AM

## 2013-10-29 NOTE — ED Provider Notes (Signed)
I saw and evaluated the patient, reviewed the resident's note and I agree with the findings and plan.  EKG Interpretation     Ventricular Rate:  78 PR Interval:  148 QRS Duration: 76 QT Interval:  380 QTC Calculation: 433 R Axis:   23 Text Interpretation:  Normal sinus rhythm Normal ECG           Patient with shortness of breath with exertion. Relieved with rest. Likely an anginal equivalent. Will be admitted as unstable angina to cardiology. EKG reassuring and enzymes are negative.  Juliet Rude. Rubin Payor, MD 10/29/13 1455

## 2013-11-02 ENCOUNTER — Telehealth: Payer: Self-pay | Admitting: Family Medicine

## 2013-11-02 ENCOUNTER — Other Ambulatory Visit: Payer: Self-pay | Admitting: *Deleted

## 2013-11-02 MED ORDER — PRAVASTATIN SODIUM 40 MG PO TABS: 40.0000 mg | ORAL_TABLET | Freq: Every day | ORAL | Status: DC

## 2013-11-02 NOTE — Telephone Encounter (Signed)
Patient Information:  Caller Name: Diane [name]  Phone: [phone]  Patient: Diane Diane [name]  Gender: Female  DOB: 13-Oct-1951  Age: 62 Years  PCP: Ruthe Mannan [facility])  Office Follow Up:  Does the office need to follow up with this patient?: Yes  Instructions For The Office: Medication question; please call back to advise if should be seen before 10/07/13 or if another Rx can be ordered.  RN Note:  "The least little thing sends me over the edge."  Currently off work for the week.  Last anxiety episode was 11/01/13 for 30-45 minutes.  Went for Diane [name] walk, took deep breathes and tried to relax which helped.  Then had another episode when trying to go to sleep related to worries about work tht keps her up half of the night.   Paces when upset, becomes diaphoretic and hands shake.  Feels like isolating from other, avoid questions or problem solving and no longer enjoys doing things she used to enjoy. Still on Citalopram 40 mg daily.  Has appointment for ED/hospital follow up 10/07/13 at 1615.  [facility]  Informed Dr Dayton Martes is out of the office this week. Please discus with  another provider and call back.  Symptoms  Reason For Call & Symptoms: Anxiety; called to request different anti-anxiety medication be ordered. Reported called for triage 10/27/13 for shortness of breath, sweating and unable to sleep. 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  Was advised to be seen at [facility] ED where she was admitted for cardiac cath, lung scan and chest xrays. Tests were negative. Cardiologist suggested follow up with PCP about possibly changing anti-anxiety medication.    Reviewed Health History In EMR: Yes  Reviewed Medications In EMR: Yes  Reviewed Allergies In EMR: Yes  Reviewed Surgeries / Procedures: Yes  Date of Onset of Symptoms: 10/24/2013  Treatments Tried: Citalopram 40 mg  Treatments Tried Worked: No  Guideline(s) Used:  Depression  Disposition Per  Guideline:   Discuss with PCP and Callback by Nurse Today  Reason For Disposition Reached:   Started on anti-depressant medications > 2 weeks ago and not feeling any better  Advice Given:  N/Diane [name]  Patient Will Follow Care Advice:  YES

## 2013-11-03 ENCOUNTER — Ambulatory Visit (INDEPENDENT_AMBULATORY_CARE_PROVIDER_SITE_OTHER): Payer: 59 | Admitting: Family Medicine

## 2013-11-03 ENCOUNTER — Encounter: Payer: Self-pay | Admitting: Family Medicine

## 2013-11-03 ENCOUNTER — Other Ambulatory Visit: Payer: Self-pay

## 2013-11-03 VITALS — BP 142/90 | HR 76 | Temp 98.8°F | Wt 191.5 lb

## 2013-11-03 DIAGNOSIS — F411 Generalized anxiety disorder: Secondary | ICD-10-CM

## 2013-11-03 MED ORDER — DIAZEPAM 5 MG PO TABS
2.5000 mg | ORAL_TABLET | Freq: Two times a day (BID) | ORAL | Status: DC | PRN
Start: 2013-11-03 — End: 2013-11-14

## 2013-11-03 NOTE — Assessment & Plan Note (Signed)
Worsened, concurrently with inc in work demands.  Would continue SSRI for now, use BZD sparingly for abortive tx and then f/u with work EAP.  Okay for outpatient fu.  D/w pt about BZD use and sedation, cautions for frequent use.  If not resolving and needed more often we may have to alter her meds.  She agrees with plan.  No SI/HI.  >25 min spent with face to face with patient, >50% counseling and/or coordinating care.

## 2013-11-03 NOTE — Telephone Encounter (Signed)
She should be seen sooner by me or any other provider available

## 2013-11-03 NOTE — Progress Notes (Signed)
Pre-visit discussion using our clinic review tool. No additional management support is needed unless otherwise documented below in the visit note.  Recently admitted with neg cath and VQ scan; was SOB.  Discharged home with cards f/u pending.    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  Recently divorced.  Lives with fiancee.  Safe at home.  Nonsmoker.  Rare etoh.  No SI/HI.  "I am a control freak and now I'm all over the place, my focus isn't like it needs to be."  Her work reviews are good, "but what is the cost to me?"  "It wears me out and I'm not doing the things I enjoy."  "My brain won't turn off."  She has tried deep breathing exercises. Sleep is disrupted.    Her work load upswing seems to be contributing.  We talked about EAP at work or outside counseling.    Prev on citalopram and the dose had been increased prev (with some good effect).   She is jittery, occ sweaty from anxiety, and having more headaches recently.   Meds, vitals, and allergies reviewed.   ROS: See HPI.  Otherwise, noncontributory.  nad but anxious appearing Speech wnl, fluent Mmm rrr ctab abd soft Ext w/o edema

## 2013-11-03 NOTE — Patient Instructions (Signed)
Use the valium if needed.  Sedation caution.  Using sparingly.  Continue your other meds.   I would look into the EAP at work.  Take care.  Notify us if you are not improving.

## 2013-11-03 NOTE — Telephone Encounter (Signed)
Pt scheduled appt with Dr Para March today at 12:15 pm. Pt said she is OK with that appt and will cb prior to appt if needed.

## 2013-11-04 ENCOUNTER — Encounter: Payer: Self-pay | Admitting: Cardiology

## 2013-11-04 DIAGNOSIS — R0989 Other specified symptoms and signs involving the circulatory and respiratory systems: Secondary | ICD-10-CM | POA: Insufficient documentation

## 2013-11-04 DIAGNOSIS — E785 Hyperlipidemia, unspecified: Secondary | ICD-10-CM | POA: Insufficient documentation

## 2013-11-04 DIAGNOSIS — I2510 Atherosclerotic heart disease of native coronary artery without angina pectoris: Secondary | ICD-10-CM | POA: Insufficient documentation

## 2013-11-04 DIAGNOSIS — I25118 Atherosclerotic heart disease of native coronary artery with other forms of angina pectoris: Secondary | ICD-10-CM | POA: Insufficient documentation

## 2013-11-04 DIAGNOSIS — R9430 Abnormal result of cardiovascular function study, unspecified: Secondary | ICD-10-CM | POA: Insufficient documentation

## 2013-11-04 NOTE — Telephone Encounter (Signed)
Pt was seen

## 2013-11-07 ENCOUNTER — Ambulatory Visit (INDEPENDENT_AMBULATORY_CARE_PROVIDER_SITE_OTHER): Payer: 59 | Admitting: Cardiology

## 2013-11-07 ENCOUNTER — Encounter: Payer: Self-pay | Admitting: Cardiology

## 2013-11-07 VITALS — BP 120/90 | HR 73 | Ht 62.0 in | Wt 188.0 lb

## 2013-11-07 DIAGNOSIS — I2510 Atherosclerotic heart disease of native coronary artery without angina pectoris: Secondary | ICD-10-CM

## 2013-11-07 DIAGNOSIS — R0609 Other forms of dyspnea: Secondary | ICD-10-CM

## 2013-11-07 NOTE — Assessment & Plan Note (Signed)
Patient has very minimal coronary disease. It is important that she had good secondary prevention. She will followup with her primary doctors.

## 2013-11-07 NOTE — Patient Instructions (Signed)
**Note De-identified Nyquan Selbe Obfuscation** Your physician recommends that you continue on your current medications as directed. Please refer to the Current Medication list given to you today.  Your physician recommends that you schedule a follow-up appointment in: as needed  

## 2013-11-07 NOTE — Assessment & Plan Note (Signed)
There is no major cardiac basis for her symptoms. No further workup.

## 2013-11-07 NOTE — Progress Notes (Signed)
HPI  The patient is here to followup hospitalization for chest discomfort. She was seen for chest discomfort and shortness of breath. Enzymes were negative. Cardiac catheterization was done. There was good LV function. There was minimal scattered disease. Careful secondary prevention is recommended. Lung scan revealed no pulmonary embolus. She was discharged home. She is now here for followup to be sure she is stable. In the meantime she has been seen by primary care. She and her doctor both agree that there is a stress component to this and she is receiving medicines for this.  No Known Allergies  Current Outpatient Prescriptions  Medication Sig Dispense Refill  . aspirin 81 MG EC tablet Take 1 tablet (81 mg total) by mouth daily. Swallow whole.  30 tablet  12  . benazepril-hydrochlorthiazide (LOTENSIN HCT) 20-25 MG per tablet Take 1 tablet by mouth  daily  90 tablet  3  . butalbital-acetaminophen-caffeine (FIORICET WITH CODEINE) 50-325-40-30 MG per capsule Take 1-2 capsules by mouth daily as needed. PRN pain  30 capsule  0  . cholecalciferol (VITAMIN D) 1000 UNITS tablet Take 1,000 Units by mouth daily.      . citalopram (CELEXA) 40 MG tablet Take 40 mg by mouth at bedtime.      . diazepam (VALIUM) 5 MG tablet Take 0.5-1 tablets (2.5-5 mg total) by mouth every 12 (twelve) hours as needed for anxiety.  20 tablet  0  . esomeprazole (NEXIUM) 40 MG capsule Take 40 mg by mouth daily as needed.       . Nutritional Supplements (ESTROVEN PO) Take 1 capsule by mouth daily.      . pravastatin (PRAVACHOL) 40 MG tablet Take 1 tablet (40 mg total) by mouth daily.  90 tablet  1   No current facility-administered medications for this visit.    History   Social History  . Marital Status: Single    Spouse Name: Zollie Beckers Hipps    Number of Children: 8  . Years of Education: N/A   Occupational History  . Works full time at [employer] as [employer]   Social History Main Topics  . Smoking  status: Never Smoker   . Smokeless tobacco: Never Used     Comment: LIVES WITH 2 SMOKERS   . Alcohol Use: Yes     Comment: Rare social ETOH  . Drug Use: No  . Sexual Activity: Not on file   Other Topics Concern  . Not on file   Social History Narrative   Divorced, but has fiancee.  Lives with fiancee in [place].  Moved here recently from [place], [place].     She 8 children- ages 35- 4.                Family History  Problem Relation Age of Onset  . Stroke Father   . Heart failure Father     Past Medical History  Diagnosis Date  . 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 Hypertension   . GERD (gastroesophageal reflux disease)   . Hyperlipidemia   . Ejection fraction   . CAD (coronary artery disease)     Past Surgical History  Procedure Laterality Date  . Cesarean section      Patient Active Problem List   Diagnosis Date Noted  . Carotid bruit 11/04/2013  . Hyperlipidemia   . Ejection fraction   . CAD (coronary artery disease)   . History of CVA (  cerebrovascular accident) 10/28/2013  . Dyslipidemia 10/28/2013  . Dyspnea on exertion 10/28/2013  . Generalized anxiety disorder 04/11/2013  . Routine general medical examination at a health care facility 04/11/2013  . GERD (gastroesophageal reflux disease) 02/02/2013  . Hypertension 02/02/2013    ROS   Patient denies fever, chills, headache, sweats, rash, change in vision, change in hearing, chest pain, cough, nausea or vomiting, urinary symptoms. All other systems are reviewed and are negative.  PHYSICAL EXAM  Patient is oriented to person time and place. Affect is normal. She is overweight. There is no jugulovenous distention. Lungs are clear. Respiratory effort is nonlabored. Cardiac exam reveals S1 and S2. There no clicks or significant murmurs. The abdomen is soft is no peripheral edema.  Filed Vitals:   11/07/13 1623  BP: 120/90  Pulse: 73  Height: 5\' 2"  (1.575 m)   Weight: 188 lb (85.276 kg)  SpO2: 99%     ASSESSMENT & PLAN

## 2013-11-14 ENCOUNTER — Other Ambulatory Visit: Payer: Self-pay | Admitting: Family Medicine

## 2013-11-14 NOTE — Telephone Encounter (Signed)
Electronic refill request.  Please advise. 

## 2013-11-14 NOTE — Telephone Encounter (Signed)
Ok to phone in.

## 2013-11-14 NOTE — Telephone Encounter (Signed)
Rx called to pharmacy as instructed. 

## 2013-11-21 ENCOUNTER — Other Ambulatory Visit: Payer: Self-pay | Admitting: Family Medicine

## 2013-11-21 MED ORDER — BUTALBITAL-APAP-CAFF-COD 50-325-40-30 MG PO CAPS: 1.0000 | ORAL_CAPSULE | Freq: Every day | ORAL | Status: DC | PRN

## 2013-11-21 NOTE — Telephone Encounter (Signed)
Pt requesting refill on Fioricet.  Please call pt if approved, [facility] phone line is down per pt.

## 2013-11-21 NOTE — Telephone Encounter (Signed)
[facility] [address] [facility] # is out of order; spoke with Diane [name] and he said to wait until 11/22/13 and try to call med in.

## 2013-11-21 NOTE — Telephone Encounter (Signed)
Ok to phone in.

## 2013-11-22 NOTE — Telephone Encounter (Signed)
Pt called requesting med called to [facility] [address]. Medication phoned to [facility] pt.pharmacy as instructed.pt notified done.

## 2013-11-29 ENCOUNTER — Other Ambulatory Visit: Payer: Self-pay | Admitting: Family Medicine

## 2013-11-29 NOTE — Telephone Encounter (Signed)
Please call in.  Notify pt.  If needed often we may have to alter her meds; would need OV.

## 2013-11-29 NOTE — Telephone Encounter (Signed)
Electronic refill request. Please advise.   This was just filled on 11/14/13.

## 2013-11-30 NOTE — Telephone Encounter (Signed)
Pt left v/m requesting status of Diazepam refill.Medication phoned to [facility] [address] as instructed. Pt will cb if needs more med and schedule appt.

## 2013-11-30 NOTE — Telephone Encounter (Signed)
Rx called into pharmacy-[facility],cma 

## 2013-12-13 ENCOUNTER — Other Ambulatory Visit: Payer: Self-pay | Admitting: *Deleted

## 2013-12-13 MED ORDER — BUTALBITAL-APAP-CAFF-COD 50-325-40-30 MG PO CAPS: 1.0000 | ORAL_CAPSULE | Freq: Every day | ORAL | Status: DC | PRN

## 2013-12-13 NOTE — Telephone Encounter (Signed)
Received a request for a 90 day rx of Fioricet from [facility]. Pt last had med filled locally at [facility] on 11/22/13. Requesting rx early because shipping from mail order will take a few weeks.

## 2013-12-13 NOTE — Telephone Encounter (Signed)
Ok to phone in fioricet for 90 day supply

## 2013-12-14 NOTE — Telephone Encounter (Signed)
Ok to give 90 pills for fioricet

## 2013-12-14 NOTE — Telephone Encounter (Signed)
I called [facility] and they advised me that only a physician can call in more than a 30 day supply,not a mid-level--they stated it can be called in or pt will have to mail in a Rx--please advise as how to proceed

## 2013-12-14 NOTE — Telephone Encounter (Signed)
This is a Dr Dayton Martes pt and Nicki Reaper had approved a 90 day supply of Fioricet w/ Codeine. [facility] states that for more than a 30 day supply the prescriber has to be a physician and not a mid-level provider. I was asked to see if you would authorize this medication. If you do approve I can call it in otherwise pt will have to mail in a Rx--please advise

## 2013-12-14 NOTE — Telephone Encounter (Signed)
90D Rx called into neighborhood [place] pharmacy as requested

## 2013-12-15 NOTE — Telephone Encounter (Signed)
I would recommend just phoning in #[phone] x [phone] locally and leaving the 3 month decision to Dr Dayton Martes since this is a controlled substance

## 2013-12-15 NOTE — Telephone Encounter (Signed)
.  left message to have patient return my call.  

## 2013-12-19 ENCOUNTER — Other Ambulatory Visit: Payer: Self-pay | Admitting: *Deleted

## 2014-01-12 ENCOUNTER — Encounter: Payer: Self-pay | Admitting: Internal Medicine

## 2014-01-12 ENCOUNTER — Telehealth: Payer: Self-pay | Admitting: Family Medicine

## 2014-01-12 ENCOUNTER — Ambulatory Visit (INDEPENDENT_AMBULATORY_CARE_PROVIDER_SITE_OTHER): Payer: 59 | Admitting: Internal Medicine

## 2014-01-12 VITALS — BP 130/80 | HR 79 | Temp 97.4°F | Ht 62.0 in | Wt 193.2 lb

## 2014-01-12 DIAGNOSIS — B9789 Other viral agents as the cause of diseases classified elsewhere: Secondary | ICD-10-CM

## 2014-01-12 DIAGNOSIS — I10 Essential (primary) hypertension: Secondary | ICD-10-CM

## 2014-01-12 DIAGNOSIS — F411 Generalized anxiety disorder: Secondary | ICD-10-CM

## 2014-01-12 DIAGNOSIS — B349 Viral infection, unspecified: Secondary | ICD-10-CM

## 2014-01-12 MED ORDER — HYDROCODONE-HOMATROPINE 5-1.5 MG/5ML PO SYRP: 5.0000 mL | ORAL_SOLUTION | Freq: Four times a day (QID) | ORAL | Status: DC | PRN

## 2014-01-12 MED ORDER — OSELTAMIVIR PHOSPHATE 75 MG PO CAPS: 75.0000 mg | ORAL_CAPSULE | Freq: Two times a day (BID) | ORAL | Status: DC

## 2014-01-12 NOTE — Progress Notes (Signed)
Subjective:    Patient ID: Diane [name], female    DOB: April 08, 1951, 63 y.o.   MRN: [id number]  HPI Here with acute onset mild to mod 2-3 days ST, HA, general weakness and malaise, with prod cough clearish sputum, with diffuse myalgias, some nausea and loose stools, but Pt denies chest pain, increased sob or doe, wheezing, orthopnea, PND, increased LE swelling, palpitations, dizziness or syncope. Pt denies new neurological symptoms such as new headache, or facial or extremity weakness or numbness   Pt denies polydipsia, polyuria, Denies worsening depressive symptoms, suicidal ideation, or panic Past Medical History  Diagnosis Date  . 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 Hypertension   . GERD (gastroesophageal reflux disease)   . Hyperlipidemia   . Ejection fraction   . CAD (coronary artery disease)    Past Surgical History  Procedure Laterality Date  . Cesarean section      reports that she has never smoked. She has never used smokeless tobacco. She reports that she drinks alcohol. She reports that she does not use illicit drugs. family history includes Heart failure in her father; Stroke in her father. No Known Allergies Current Outpatient Prescriptions on File Prior to Visit  Medication Sig Dispense Refill  . aspirin 81 MG EC tablet Take 1 tablet (81 mg total) by mouth daily. Swallow whole.  30 tablet  12  . benazepril-hydrochlorthiazide (LOTENSIN HCT) 20-25 MG per tablet Take 1 tablet by mouth  daily  90 tablet  3  . butalbital-acetaminophen-caffeine (FIORICET WITH CODEINE) 50-325-40-30 MG per capsule Take 1-2 capsules by mouth daily as needed. PRN pain  30 capsule  0  . cholecalciferol (VITAMIN D) 1000 UNITS tablet Take 1,000 Units by mouth daily.      . citalopram (CELEXA) 40 MG tablet Take 40 mg by mouth at bedtime.      . diazepam (VALIUM) 5 MG tablet TAKE ONE-HALF TO ONE TABLET BY MOUTH EVERY 12 HOURS AS NEEDED FOR ANXIETY  20  tablet  0  . esomeprazole (NEXIUM) 40 MG capsule Take 40 mg by mouth daily as needed.       . Nutritional Supplements (ESTROVEN PO) Take 1 capsule by mouth daily.      . pravastatin (PRAVACHOL) 40 MG tablet Take 1 tablet (40 mg total) by mouth daily.  90 tablet  1   No current facility-administered medications on file prior to visit.   Review of Systems  Constitutional: Negative for unexpected weight change, or unusual diaphoresis  HENT: Negative for tinnitus.   Eyes: Negative for photophobia and visual disturbance.  Respiratory: Negative for choking and stridor.   Gastrointestinal: Negative for vomiting and blood in stool.  Genitourinary: Negative for hematuria and decreased urine volume.  Musculoskeletal: Negative for acute joint swelling Skin: Negative for color change and wound.  Neurological: Negative for tremors and numbness other than noted  Psychiatric/Behavioral: Negative for decreased concentration or  hyperactivity.       Objective:   Physical Exam BP 130/80  Pulse 79  Temp(Src) 97.4 F (36.3 C) (Oral)  Ht 5\' 2"  (1.575 m)  Wt 193 lb 4 oz (87.658 kg)  BMI 35.34 kg/m2  SpO2 97% VS noted, mild ill Constitutional: Pt appears well-developed and well-nourished.  HENT: Head: NCAT.  Right Ear: External ear normal.  Left Ear: External ear normal.  Eyes: Conjunctivae and EOM are normal. Pupils are equal, round, and reactive to light.  Bilat tm's  with mild erythema.  Max sinus areas non tender.  Pharynx with mild erythema, no exudate Neck: Normal range of motion. Neck supple.  Cardiovascular: Normal rate and regular rhythm.   Pulmonary/Chest: Effort normal and breath sounds without rales or wheezing.  Abd:  Soft, NT, non-distended, + BS Neurological: Pt is alert. Not confused  Skin: Skin is warm. No erythema.  Psychiatric: Pt behavior is normal. Thought content normal. not overly nervous appaering today    Assessment & Plan:

## 2014-01-12 NOTE — Patient Instructions (Signed)
Please take all new medication as prescribed Please continue all other medications as before, and refills have been done if requested.  You can also take Mucinex (or it's generic off brand) for congestion, and tylenol as needed for pain.

## 2014-01-12 NOTE — Telephone Encounter (Signed)
Patient Information:  Caller Name: Maeva  Phone: [phone]  Patient: Diane [name]  Gender: Female  DOB: September 30, 1951  Age: 63 Years  PCP: Arnette Norris [facility])  Office Follow Up:  Does the office need to follow up with this patient?: No  Instructions For The Office: N/A  RN Note:  Called from work. Sinus pressure present. Afebrile. Temp 101.4 01/11/14 at 1930. Hydrate and humidify to loosen mucus.  May use Guaifenesin to loosen mucus.  No appointments remain at [facility] or [place].  Scheduled for 1430 01/12/14 with Dr Jenny Reichmann at [place] office.   Symptoms  Reason For Call & Symptoms: Productive cough with yellow rhinorrhea and burning of eyes.  Asking how to manage sympotms since takes BP meds.  Reviewed Health History In EMR: Yes  Reviewed Medications In EMR: Yes  Reviewed Allergies In EMR: Yes  Reviewed Surgeries / Procedures: Yes  Date of Onset of Symptoms: 01/10/2014  Treatments Tried: Showers, humidifier, Alka-Seltzer plus Cold formula  Treatments Tried Worked: Yes  Guideline(s) Used:  Colds  Cough  Disposition Per Guideline:   See Today in Office  Reason For Disposition Reached:   Severe coughing spells (e.g., whooping sound after coughing, vomiting after coughing)  Advice Given:  Reassurance  Coughing is the way that our lungs remove irritants and mucus. It helps protect our lungs from getting pneumonia.  You can get [name] dry hacking cough after [name] chest cold. Sometimes this type of cough can last 1-3 weeks, and be worse at night.  Cough Medicines:  OTC Cough Syrups: The most common cough suppressant in OTC cough medications is dextromethorphan. Often the letters "DM" appear in the name.  OTC Cough Drops: Cough drops can help [name] lot, especially for mild coughs. They reduce coughing by soothing your irritated throat and removing that tickle sensation in the back of the throat. Cough drops also have the advantage of portability - you can carry them with  you.  Home Remedy - Hard Candy: Hard candy works just as well as medicine-flavored OTC cough drops. Diabetics should use sugar-free candy.  Home Remedy - Honey: This old home remedy has been shown to help decrease coughing at night. The adult dosage is 2 teaspoons (10 ml) at bedtime. Honey should not be given to infants under one year of age.  Coughing Spasms:  Drink warm fluids. Inhale warm mist (Reason: both relax the airway and loosen up the phlegm).  Suck on cough drops or hard candy to coat the irritated throat.  Prevent Dehydration:  Drink adequate liquids.  This will help soothe an irritated or dry throat and loosen up the phlegm.  Expected Course:   The expected course depends on what is causing the cough.  Viral bronchitis (chest cold) causes [name] cough that lasts 1 to 3 weeks. Sometimes you may cough up lots of phlegm (sputum, mucus). The mucus can normally be white, gray, yellow, or green.  Call Back If:  Difficulty breathing  Cough lasts more than 3 weeks  Fever lasts > 3 days  You become worse.  Patient Will Follow Care Advice:  YES

## 2014-01-12 NOTE — Progress Notes (Signed)
Pre-visit discussion using our clinic review tool. No additional management support is needed unless otherwise documented below in the visit note.  

## 2014-01-14 NOTE — Assessment & Plan Note (Signed)
Likely influenza like illness, for tamiflu asd, cough med prn, to f/u any worsening symptoms or concerns

## 2014-01-14 NOTE — Assessment & Plan Note (Signed)
stable overall by history and exam, recent data reviewed with pt, and pt to continue medical treatment as before,  to f/u any worsening symptoms or concerns BP Readings from Last 3 Encounters:  01/12/14 130/80  11/07/13 120/90  11/03/13 142/90

## 2014-01-14 NOTE — Assessment & Plan Note (Signed)
stable overall by history and exam, and pt to continue medical treatment as before,  to f/u any worsening symptoms or concerns 

## 2014-02-06 ENCOUNTER — Other Ambulatory Visit: Payer: Self-pay

## 2014-02-06 MED ORDER — BUTALBITAL-APAP-CAFF-COD 50-325-40-30 MG PO CAPS: 1.0000 | ORAL_CAPSULE | Freq: Every day | ORAL | Status: DC | PRN

## 2014-02-06 NOTE — Telephone Encounter (Signed)
Lm on pts vm informing her Rx called into requested pharmacy

## 2014-02-06 NOTE — Telephone Encounter (Signed)
Pr left v/m requesting refill fioricet sent to [facility] [address].Please advise.

## 2014-02-27 ENCOUNTER — Other Ambulatory Visit: Payer: Self-pay

## 2014-02-27 MED ORDER — BUTALBITAL-APAP-CAFF-COD 50-325-40-30 MG PO CAPS: 1.0000 | ORAL_CAPSULE | Freq: Every day | ORAL | Status: DC | PRN

## 2014-02-27 NOTE — Telephone Encounter (Signed)
Lm on pts vm informing her Rx called in to requested pharmacy 

## 2014-02-27 NOTE — Telephone Encounter (Signed)
Pt request refill butalbital-apap-caffeine to [facility] [address]. Please advise.

## 2014-03-29 ENCOUNTER — Other Ambulatory Visit: Payer: Self-pay

## 2014-03-29 MED ORDER — BUTALBITAL-APAP-CAFF-COD 50-325-40-30 MG PO CAPS: 1.0000 | ORAL_CAPSULE | Freq: Every day | ORAL | Status: DC | PRN

## 2014-03-29 NOTE — Telephone Encounter (Signed)
Pt left v/m requesting fioricet # 3 refilled at [facility] [address].Please advise.

## 2014-03-29 NOTE — Telephone Encounter (Signed)
Lm on pts vm informing her Rx has been called in to requested pharmacy 

## 2014-04-24 ENCOUNTER — Other Ambulatory Visit: Payer: Self-pay

## 2014-04-24 MED ORDER — BUTALBITAL-APAP-CAFF-COD 50-325-40-30 MG PO CAPS: 1.0000 | ORAL_CAPSULE | Freq: Every day | ORAL | Status: DC | PRN

## 2014-04-24 NOTE — Telephone Encounter (Signed)
Spoke to pt and informed her Rx has been sent to requested pharmacy; informed a f/u required for additional refills. Last ov 03/2013

## 2014-04-24 NOTE — Telephone Encounter (Signed)
Pt left v/m requesting refill fioricet to codeine to [facility] [address].Please advise.

## 2014-05-05 ENCOUNTER — Other Ambulatory Visit: Payer: Self-pay | Admitting: Family Medicine

## 2014-05-22 ENCOUNTER — Other Ambulatory Visit: Payer: Self-pay | Admitting: Family Medicine

## 2014-05-30 ENCOUNTER — Other Ambulatory Visit: Payer: Self-pay

## 2014-05-30 MED ORDER — BUTALBITAL-APAP-CAFF-COD 50-325-40-30 MG PO CAPS: 1.0000 | ORAL_CAPSULE | Freq: Every day | ORAL | Status: DC | PRN

## 2014-05-30 NOTE — Telephone Encounter (Signed)
Please call in.  Thanks.   

## 2014-05-30 NOTE — Telephone Encounter (Signed)
Pt is out of fioricet with codeine and request refill today to [facility] [address]. [name] out of office and may not have access to computer. Pt last saw Dr Damita Dunnings on 11/03/13. Pt has CPX scheduled with [name] on 07/24/14.Please advise.

## 2014-05-31 NOTE — Telephone Encounter (Signed)
Phoned in to pharmacy. 

## 2014-06-26 ENCOUNTER — Other Ambulatory Visit: Payer: Self-pay

## 2014-06-26 MED ORDER — BUTALBITAL-APAP-CAFF-COD 50-325-40-30 MG PO CAPS: 1.0000 | ORAL_CAPSULE | Freq: Every day | ORAL | Status: DC | PRN

## 2014-06-26 NOTE — Telephone Encounter (Signed)
Spoke to pt and informed her Rx has been sent to requested pharmacy 

## 2014-06-26 NOTE — Telephone Encounter (Signed)
Pt left v/m requesting refill fioricet with codeine; pt has scheduled CPX on 07/24/14.Please advise.

## 2014-06-28 ENCOUNTER — Other Ambulatory Visit: Payer: Self-pay | Admitting: Family Medicine

## 2014-06-28 MED ORDER — CITALOPRAM HYDROBROMIDE 40 MG PO TABS: 40.0000 mg | ORAL_TABLET | Freq: Every day | ORAL | Status: DC

## 2014-06-28 MED ORDER — BENAZEPRIL-HYDROCHLOROTHIAZIDE 20-25 MG PO TABS: ORAL_TABLET | ORAL | Status: DC

## 2014-07-24 ENCOUNTER — Encounter: Payer: Self-pay | Admitting: Family Medicine

## 2014-07-24 ENCOUNTER — Ambulatory Visit (INDEPENDENT_AMBULATORY_CARE_PROVIDER_SITE_OTHER): Payer: 59 | Admitting: Family Medicine

## 2014-07-24 VITALS — BP 124/70 | HR 70 | Temp 97.8°F | Ht 62.5 in | Wt 196.0 lb

## 2014-07-24 DIAGNOSIS — Z1231 Encounter for screening mammogram for malignant neoplasm of breast: Secondary | ICD-10-CM

## 2014-07-24 DIAGNOSIS — I10 Essential (primary) hypertension: Secondary | ICD-10-CM

## 2014-07-24 DIAGNOSIS — E559 Vitamin D deficiency, unspecified: Secondary | ICD-10-CM

## 2014-07-24 DIAGNOSIS — E785 Hyperlipidemia, unspecified: Secondary | ICD-10-CM

## 2014-07-24 DIAGNOSIS — Z8673 Personal history of transient ischemic attack (TIA), and cerebral infarction without residual deficits: Secondary | ICD-10-CM

## 2014-07-24 DIAGNOSIS — Z0000 Encounter for general adult medical examination without abnormal findings: Secondary | ICD-10-CM

## 2014-07-24 DIAGNOSIS — Z23 Encounter for immunization: Secondary | ICD-10-CM

## 2014-07-24 DIAGNOSIS — F411 Generalized anxiety disorder: Secondary | ICD-10-CM

## 2014-07-24 DIAGNOSIS — Z1211 Encounter for screening for malignant neoplasm of colon: Secondary | ICD-10-CM

## 2014-07-24 MED ORDER — BENAZEPRIL-HYDROCHLOROTHIAZIDE 20-25 MG PO TABS: ORAL_TABLET | ORAL | Status: DC

## 2014-07-24 MED ORDER — PRAVASTATIN SODIUM 40 MG PO TABS: ORAL_TABLET | ORAL | Status: DC

## 2014-07-24 MED ORDER — DIAZEPAM 5 MG PO TABS: ORAL_TABLET | ORAL | Status: DC

## 2014-07-24 MED ORDER — CITALOPRAM HYDROBROMIDE 40 MG PO TABS: 40.0000 mg | ORAL_TABLET | Freq: Every day | ORAL | Status: DC

## 2014-07-24 MED ORDER — BUTALBITAL-APAP-CAFF-COD 50-325-40-30 MG PO CAPS: 1.0000 | ORAL_CAPSULE | Freq: Every day | ORAL | Status: DC | PRN

## 2014-07-24 NOTE — Assessment & Plan Note (Signed)
On statin. ASA.

## 2014-07-24 NOTE — Assessment & Plan Note (Signed)
Reviewed preventive care protocols, scheduled due services, and updated immunizations Discussed nutrition, exercise, diet, and healthy lifestyle.  IFOB Mammogram ordered 

## 2014-07-24 NOTE — Progress Notes (Signed)
Pre visit review using our clinic review tool, if applicable. No additional management support is needed unless otherwise documented below in the visit note. 

## 2014-07-24 NOTE — Patient Instructions (Addendum)
Check with your insurance to see if they will cover the shingles shot.  Please call to schedule your mammogram.

## 2014-07-24 NOTE — Assessment & Plan Note (Signed)
Deteriorated. Continue current dose of celexa. Restart as needed valium- she is aware of addiction and sedation potential.

## 2014-07-24 NOTE — Assessment & Plan Note (Signed)
Check lipids today. On pravachol.

## 2014-07-24 NOTE — Addendum Note (Signed)
Addended by: Ellamae Sia on: 07/24/2014 10:44 AM   Modules accepted: Orders

## 2014-07-24 NOTE — Assessment & Plan Note (Signed)
Well controlled on current rx. No changes. 

## 2014-07-24 NOTE — Progress Notes (Signed)
Subjective:    Patient ID: Diane [name], female    DOB: Jun 28, 1951, 63 y.o.   MRN: [id number]  Anxiety      Very pleasant 63 yo G8P8 with h/o CVA, HTN, HLD whom I have not seen since she established care in 03/2013, here for CPX.    Lab Results  Component Value Date   CREATININE 0.58 10/28/2013   H/o CVA- on ASA 81 mg daily. Residual right ear deafness otherwise no residual deficits.  HTN HLD- on pravachol 40 mg daily. Lab Results  Component Value Date   CHOL 249* 10/28/2013   HDL 63 10/28/2013   LDLCALC 137* 10/28/2013   TRIG 243* 10/28/2013   CHOLHDL 4.0 10/28/2013   No family h/o breast, uterine or cervical CA. LMP 5 years ago.  Last pap smear 04/15/13- done by me. Mammogram 04/29/13. Refuses colonoscopy but willing to take IFOB.  Works at [employer].  Very active.  GAD- on celexa 20 mg daily and worked well for years.  Recently has had more panic attacks- more stressors at work. No SI or HI and denies feeling depressed.  No SI or HI. She did see Dr. Damita Dunnings while I was out on maternity leave on 11/03/13 for worsening anxiety- note reviewed.  Advised to continue celexa and given rx for prn valium.  She would like it refilled to help with panic attacks. Patient Active Problem List   Diagnosis Date Noted  . Viral illness 01/12/2014  . Carotid bruit 11/04/2013  . Hyperlipidemia   . Ejection fraction   . CAD (coronary artery disease)   . History of CVA (cerebrovascular accident) 10/28/2013  . Dyslipidemia 10/28/2013  . Dyspnea on exertion 10/28/2013  . Generalized anxiety disorder 04/11/2013  . Routine general medical examination at a health care facility 04/11/2013  . GERD (gastroesophageal reflux disease) 02/02/2013  . Hypertension 02/02/2013   Past Medical History  Diagnosis Date  . 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 Hypertension   . GERD (gastroesophageal reflux disease)   . Hyperlipidemia   . Ejection  fraction   . CAD (coronary artery disease)    Past Surgical History  Procedure Laterality Date  . Cesarean section     History  Substance Use Topics  . Smoking status: Never Smoker   . Smokeless tobacco: Never Used     Comment: LIVES WITH 2 SMOKERS   . Alcohol Use: Yes     Comment: Rare social ETOH   Family History  Problem Relation Age of Onset  . Stroke Father   . Heart failure Father    No Known Allergies Current Outpatient Prescriptions on File Prior to Visit  Medication Sig Dispense Refill  . aspirin 81 MG EC tablet Take 1 tablet (81 mg total) by mouth daily. Swallow whole.  30 tablet  12  . cholecalciferol (VITAMIN D) 1000 UNITS tablet Take 1,000 Units by mouth daily.      [name] esomeprazole (NEXIUM) 40 MG capsule Take 40 mg by mouth daily as needed.       . Nutritional Supplements (ESTROVEN PO) Take 1 capsule by mouth daily.       No current facility-administered medications on file prior to visit.   The PMH, PSH, Social History, Family History, Medications, and allergies have been reviewed in [facility], and have been updated if relevant.   Review of Systems See HPI Patient reports no  vision/ hearing changes,anorexia, weight change, fever ,adenopathy, persistant /  recurrent hoarseness, swallowing issues, chest pain, edema,persistant / recurrent cough, hemoptysis, dyspnea(rest, exertional, paroxysmal nocturnal), gastrointestinal  bleeding (melena, rectal bleeding), abdominal pain, excessive heart burn, GU symptoms(dysuria, hematuria, pyuria, voiding/incontinence  Issues) syncope, focal weakness, severe memory loss, concerning skin lesions, depression, anxiety, abnormal bruising/bleeding, major joint swelling, breast masses or abnormal vaginal bleeding.       Objective:   Physical Exam BP 124/70  Pulse 70  Temp(Src) 97.8 F (36.6 C) (Oral)  Ht 5' 2.5" (1.588 m)  Wt 196 lb (88.905 kg)  BMI 35.26 kg/m2  SpO2 97%  General:  Well-developed,well-nourished,in no acute distress;  alert,appropriate and cooperative throughout examination Head:  normocephalic and atraumatic.   Eyes:  vision grossly intact, pupils equal, pupils round, and pupils reactive to light.   Ears:  R ear normal and L ear normal.   Nose:  no external deformity.   Mouth:  good dentition.   Neck:  No deformities, masses, or tenderness noted. Breasts:  No mass, nodules, thickening, tenderness, bulging, retraction, inflamation, nipple discharge or skin changes noted.   Lungs:  Normal respiratory effort, chest expands symmetrically. Lungs are clear to auscultation, no crackles or wheezes. Heart:  Normal rate and regular rhythm. S1 and S2 normal without gallop, murmur, click, rub or other extra sounds. Abdomen:  Bowel sounds positive,abdomen soft and non-tender without masses, organomegaly or hernias noted. Msk:  No deformity or scoliosis noted of thoracic or lumbar spine.   Extremities:  No clubbing, cyanosis, edema, or deformity noted with normal full range of motion of all joints.   Neurologic:  alert & oriented X3 and gait normal.   Skin:  Intact without suspicious lesions or rashes Psych:  Cognition and judgment appear intact. Alert and cooperative with normal attention span and concentration. No apparent delusions, illusions, hallucinations    Assessment & Plan:

## 2014-07-25 LAB — CBC WITH DIFFERENTIAL/PLATELET
Basophils Absolute: 0 10*3/uL (ref 0.0–0.2)
Basos: 0 %
Eos: 2 %
Eosinophils Absolute: 0.1 10*3/uL (ref 0.0–0.4)
HEMATOCRIT: 44.4 % (ref 34.0–46.6)
HEMOGLOBIN: 15.6 g/dL (ref 11.1–15.9)
Immature Grans (Abs): 0 10*3/uL (ref 0.0–0.1)
Immature Granulocytes: 0 %
LYMPHS ABS: 2.8 10*3/uL (ref 0.7–3.1)
Lymphs: 36 %
MCH: 33.5 pg — AB (ref 26.6–33.0)
MCHC: 35.1 g/dL (ref 31.5–35.7)
MCV: 95 fL (ref 79–97)
Monocytes Absolute: 0.6 10*3/uL (ref 0.1–0.9)
Monocytes: 7 %
NEUTROS ABS: 4.3 10*3/uL (ref 1.4–7.0)
Neutrophils Relative %: 55 %
RBC: 4.66 x10E6/uL (ref 3.77–5.28)
RDW: 12.6 % (ref 12.3–15.4)
WBC: 7.9 10*3/uL (ref 3.4–10.8)

## 2014-07-25 LAB — COMPREHENSIVE METABOLIC PANEL
ALBUMIN: 4.3 g/dL (ref 3.6–4.8)
ALT: 27 IU/L (ref 0–32)
AST: 18 IU/L (ref 0–40)
Albumin/Globulin Ratio: 1.8 (ref 1.1–2.5)
Alkaline Phosphatase: 70 IU/L (ref 39–117)
BILIRUBIN TOTAL: 0.4 mg/dL (ref 0.0–1.2)
BUN / CREAT RATIO: 21 (ref 11–26)
BUN: 15 mg/dL (ref 8–27)
CO2: 28 mmol/L (ref 18–29)
Calcium: 9.8 mg/dL (ref 8.7–10.3)
Chloride: 89 mmol/L — ABNORMAL LOW (ref 97–108)
Creatinine, Ser: 0.73 mg/dL (ref 0.57–1.00)
GFR calc Af Amer: 103 mL/min/{1.73_m2} (ref >59)
GFR, EST NON AFRICAN AMERICAN: 89 mL/min/{1.73_m2} (ref >59)
GLUCOSE: 83 mg/dL (ref 65–99)
Globulin, Total: 2.4 g/dL (ref 1.5–4.5)
Potassium: 4 mmol/L (ref 3.5–5.2)
Sodium: 134 mmol/L (ref 134–144)
TOTAL PROTEIN: 6.7 g/dL (ref 6.0–8.5)

## 2014-07-25 LAB — LIPID PANEL
Chol/HDL Ratio: 3.5 ratio units (ref 0.0–4.4)
Cholesterol, Total: 257 mg/dL — ABNORMAL HIGH (ref 100–199)
HDL: 74 mg/dL (ref >39)
LDL CALC: 137 mg/dL — AB (ref 0–99)
Triglycerides: 228 mg/dL — ABNORMAL HIGH (ref 0–149)
VLDL CHOLESTEROL CAL: 46 mg/dL — AB (ref 5–40)

## 2014-07-25 LAB — VITAMIN D 25 HYDROXY (VIT D DEFICIENCY, FRACTURES): VIT D 25 HYDROXY: 33.3 ng/mL (ref 30.0–100.0)

## 2014-07-25 LAB — TSH: TSH: 1.72 u[IU]/mL (ref 0.450–4.500)

## 2014-08-22 ENCOUNTER — Ambulatory Visit: Payer: Self-pay | Admitting: Family Medicine

## 2014-08-22 ENCOUNTER — Other Ambulatory Visit: Payer: Self-pay | Admitting: Family Medicine

## 2014-08-24 ENCOUNTER — Encounter: Payer: Self-pay | Admitting: Family Medicine

## 2014-08-25 ENCOUNTER — Other Ambulatory Visit: Payer: Self-pay

## 2014-08-25 MED ORDER — BUTALBITAL-APAP-CAFF-COD 50-325-40-30 MG PO CAPS: 1.0000 | ORAL_CAPSULE | Freq: Every day | ORAL | Status: DC | PRN

## 2014-08-25 NOTE — Telephone Encounter (Signed)
Pt left v/m requesting refill fioricet with codeine called to [facility] [address].Please advise.

## 2014-08-25 NOTE — Telephone Encounter (Signed)
Px printed for pick up in IN box  Call in if that is allowed

## 2014-08-25 NOTE — Telephone Encounter (Signed)
Printed Rx faxed to [facility]

## 2014-09-21 ENCOUNTER — Other Ambulatory Visit: Payer: Self-pay | Admitting: Family Medicine

## 2014-09-21 NOTE — Telephone Encounter (Signed)
Rx faxed to requested pharmacy 

## 2014-09-21 NOTE — Telephone Encounter (Signed)
Last filled 08/25/14, pt's last ov was a cpx in 7/15.

## 2014-09-25 ENCOUNTER — Other Ambulatory Visit: Payer: Self-pay | Admitting: *Deleted

## 2014-09-25 MED ORDER — DIAZEPAM 5 MG PO TABS: ORAL_TABLET | ORAL | Status: DC

## 2014-09-25 NOTE — Telephone Encounter (Signed)
Pt requesting medication refill. Last f/u appt 06/2014-CPE. Ok to fill per [name],. Rx to be faxed to requested pharmacy before the end of day,today

## 2014-10-04 ENCOUNTER — Emergency Department: Payer: Self-pay | Admitting: Student

## 2014-10-07 ENCOUNTER — Other Ambulatory Visit: Payer: Self-pay | Admitting: Family Medicine

## 2014-10-25 ENCOUNTER — Telehealth: Payer: Self-pay

## 2014-10-25 ENCOUNTER — Other Ambulatory Visit: Payer: Self-pay

## 2014-10-25 MED ORDER — BUTALBITAL-APAP-CAFF-COD 50-325-40-30 MG PO CAPS: ORAL_CAPSULE | ORAL | Status: DC

## 2014-10-25 NOTE — Telephone Encounter (Signed)
Please phone in fioricet

## 2014-10-25 NOTE — Telephone Encounter (Signed)
Pt left v/m; [facility] for refill of migraine med; pt request cb when med refilled.

## 2014-10-25 NOTE — Telephone Encounter (Signed)
Pt called to ck status of refill; med called to [facility] [address] and pt notified done.

## 2014-10-25 NOTE — Telephone Encounter (Signed)
Error - duplicate

## 2014-11-22 ENCOUNTER — Other Ambulatory Visit: Payer: Self-pay | Admitting: *Deleted

## 2014-11-22 MED ORDER — DIAZEPAM 5 MG PO TABS: ORAL_TABLET | ORAL | Status: DC

## 2014-11-22 NOTE — Telephone Encounter (Signed)
Dr. Hulen Shouts last note reviewed. Ok to phone in [place]

## 2014-11-22 NOTE — Telephone Encounter (Signed)
Rx called in as prescribed 

## 2014-11-22 NOTE — Telephone Encounter (Signed)
Fax refill request, Dr. Deborra Medina out of office

## 2014-11-24 ENCOUNTER — Other Ambulatory Visit: Payer: Self-pay | Admitting: *Deleted

## 2014-11-24 MED ORDER — BUTALBITAL-APAP-CAFF-COD 50-325-40-30 MG PO CAPS: ORAL_CAPSULE | ORAL | Status: DC

## 2014-11-27 ENCOUNTER — Other Ambulatory Visit: Payer: Self-pay

## 2014-11-27 MED ORDER — BUTALBITAL-APAP-CAFF-COD 50-325-40-30 MG PO CAPS: ORAL_CAPSULE | ORAL | Status: DC

## 2014-11-27 NOTE — Telephone Encounter (Signed)
Pt called to ck status of fioricet refill; advised called to pharmacy this afternoon. Pt will ck with pharmacy.

## 2014-11-27 NOTE — Telephone Encounter (Signed)
Last filled by [facility] 10/25/14--last OV with you was for CPE 06/2014--please advise

## 2014-11-27 NOTE — Telephone Encounter (Signed)
Rx called in to pharmacy. 

## 2014-11-27 NOTE — Telephone Encounter (Signed)
Rx called in to requested pharmacy 

## 2014-12-06 ENCOUNTER — Other Ambulatory Visit: Payer: Self-pay | Admitting: Internal Medicine

## 2014-12-07 ENCOUNTER — Encounter (HOSPITAL_COMMUNITY): Payer: Self-pay | Admitting: Cardiology

## 2015-01-02 ENCOUNTER — Encounter (HOSPITAL_BASED_OUTPATIENT_CLINIC_OR_DEPARTMENT_OTHER): Payer: Self-pay | Admitting: *Deleted

## 2015-01-02 NOTE — Progress Notes (Signed)
To come in for ekg-bmet 

## 2015-01-02 NOTE — Progress Notes (Signed)
   01/02/15 1114  OBSTRUCTIVE SLEEP APNEA  Have you ever been diagnosed with sleep apnea through a sleep study? No  Do you snore loudly (loud enough to be heard through closed doors)?  0  Do you often feel tired, fatigued, or sleepy during the daytime? 0  Has anyone observed you stop breathing during your sleep? 0  Do you have, or are you being treated for high blood pressure? 1  BMI more than 35 kg/m2? 1  Age over 63 years old? 1  Neck circumference greater than 40 cm/16 inches? 1  Gender: 0  Obstructive Sleep Apnea Score 4  Score 4 or greater  Results sent to PCP

## 2015-01-03 ENCOUNTER — Ambulatory Visit: Payer: Self-pay | Admitting: Physician Assistant

## 2015-01-03 ENCOUNTER — Other Ambulatory Visit: Payer: Self-pay

## 2015-01-03 ENCOUNTER — Encounter (HOSPITAL_BASED_OUTPATIENT_CLINIC_OR_DEPARTMENT_OTHER)
Admission: RE | Admit: 2015-01-03 | Discharge: 2015-01-03 | Disposition: A | Discharge status: Home / Self Care | Payer: 59 | Source: Ambulatory Visit | Attending: Orthopedic Surgery | Admitting: Orthopedic Surgery

## 2015-01-03 DIAGNOSIS — Z8673 Personal history of transient ischemic attack (TIA), and cerebral infarction without residual deficits: Secondary | ICD-10-CM | POA: Diagnosis not present

## 2015-01-03 DIAGNOSIS — E785 Hyperlipidemia, unspecified: Secondary | ICD-10-CM | POA: Diagnosis not present

## 2015-01-03 DIAGNOSIS — S83242A Other tear of medial meniscus, current injury, left knee, initial encounter: Secondary | ICD-10-CM | POA: Diagnosis not present

## 2015-01-03 DIAGNOSIS — K219 Gastro-esophageal reflux disease without esophagitis: Secondary | ICD-10-CM | POA: Diagnosis not present

## 2015-01-03 DIAGNOSIS — M1990 Unspecified osteoarthritis, unspecified site: Secondary | ICD-10-CM | POA: Diagnosis not present

## 2015-01-03 DIAGNOSIS — M25562 Pain in left knee: Secondary | ICD-10-CM | POA: Diagnosis not present

## 2015-01-03 DIAGNOSIS — I2510 Atherosclerotic heart disease of native coronary artery without angina pectoris: Secondary | ICD-10-CM | POA: Diagnosis not present

## 2015-01-03 DIAGNOSIS — I10 Essential (primary) hypertension: Secondary | ICD-10-CM | POA: Diagnosis not present

## 2015-01-03 LAB — BASIC METABOLIC PANEL
Anion gap: 10 (ref 5–15)
BUN: 12 mg/dL (ref 6–23)
CO2: 28 mmol/L (ref 19–32)
CREATININE: 0.74 mg/dL (ref 0.50–1.10)
Calcium: 10 mg/dL (ref 8.4–10.5)
Chloride: 101 mEq/L (ref 96–112)
GFR calc Af Amer: >90 mL/min (ref >90)
GFR, EST NON AFRICAN AMERICAN: 89 mL/min — AB (ref >90)
GLUCOSE: 138 mg/dL — AB (ref 70–99)
Potassium: 3.8 mmol/L (ref 3.5–5.1)
Sodium: 139 mmol/L (ref 135–145)

## 2015-01-03 NOTE — H&P (Signed)
Diane [name] is an 64 y.o. female.   Chief Complaint: left knee medial meniscus tear HPI: Diane [name] is a 64 year-old female with a several month history of left knee pain.  She reports her knee has been achy and sore and has progressed to severe pain.  The pain is located along the medial joint line.  It is worse with weight bearing, better with rest.  She was seen in the emergency room, had some x-rays done and was given some Hydrocodone tablets.  She said that has helped a little bit.  She has done a small amount of anti-inflammatories as well.  She is describing severe pain along the medial joint line.  Worse when she weight bears and twists.  Better with rest.  She is also reporting a fair amount of swelling. Some giving way symptoms.   She denies any prior history of knee problems in the past.  We elected to obtain MRI left knee which shows medial meniscus tear with underlying OA.    Past Medical History  Diagnosis Date  . 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 Hypertension   . GERD (gastroesophageal reflux disease)   . Hyperlipidemia   . Ejection fraction   . CAD (coronary artery disease)   . Arthritis   . PONV (postoperative nausea and vomiting)   . 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 Cesarean section    . Left heart catheterization with coronary angiogram N/A 10/28/2013    Procedure: LEFT HEART CATHETERIZATION WITH CORONARY ANGIOGRAM;  Surgeon: Larey Dresser, MD;  Location: [facility] CATH LAB;  Service: Cardiovascular;  Laterality: N/A;  . Tubal ligation    . Cholecystectomy  1990    Family History  Problem Relation Age of Onset  . Stroke Father   . Heart failure Father    Social History:  reports that she has never smoked. She has never used smokeless tobacco. She reports that she drinks alcohol. She reports that she does not use illicit drugs.  Allergies: No Known  Allergies   (Not in a hospital admission)  Results for orders placed or performed during the hospital encounter of 01/05/15 (from the past 48 hour(s))  Basic metabolic panel     Status: Abnormal   Collection Time: 01/03/15  8:35 AM  Result Value Ref Range   Sodium 139 135 - 145 mmol/L    Comment: Please note change in reference range.   Potassium 3.8 3.5 - 5.1 mmol/L    Comment: Please note change in reference range.   Chloride 101 96 - 112 mEq/L   CO2 28 19 - 32 mmol/L   Glucose, Bld 138 (H) 70 - 99 mg/dL   BUN 12 6 - 23 mg/dL   Creatinine, Ser 0.74 0.50 - 1.10 mg/dL   Calcium 10.0 8.4 - 10.5 mg/dL   GFR calc non Af Amer 89 (L) >90 mL/min   GFR calc Af Amer >90 >90 mL/min    Comment: (NOTE) The eGFR has been calculated using the CKD EPI equation. This calculation has not been validated in all clinical situations. eGFR's persistently <90 mL/min signify possible Chronic Kidney Disease.    Anion gap 10 5 - 15   No results found.  Review of Systems  Constitutional: Negative.   HENT: Positive for hearing loss. Negative for congestion, ear discharge, ear pain, nosebleeds,  sore throat and tinnitus.   Eyes: Negative.   Respiratory: Negative.  Negative for stridor.   Cardiovascular: Negative.   Gastrointestinal: Negative.   Genitourinary: Negative.   Musculoskeletal: Positive for joint pain. Negative for falls.  Skin: Negative.   Neurological: Positive for headaches. Negative for dizziness, tingling, tremors, sensory change, speech change, focal weakness, seizures and loss of consciousness.  Endo/Heme/Allergies: Negative.   Psychiatric/Behavioral: Positive for depression. Negative for suicidal ideas, hallucinations, memory loss and substance abuse. The patient is not nervous/anxious and does not have insomnia.     There were no vitals taken for this visit. Physical Exam  Constitutional: She is oriented to person, place, and time. She appears well-developed and well-nourished.  No distress.  HENT:  Head: Normocephalic and atraumatic.  Nose: Nose normal.  Eyes: Conjunctivae and EOM are normal. Pupils are equal, round, and reactive to light.  Neck: Normal range of motion. Neck supple.  Cardiovascular: Normal rate and intact distal pulses.   Respiratory: Effort normal. No respiratory distress.  GI: Soft. She exhibits no distension. There is no tenderness.  Musculoskeletal:       Left knee: She exhibits swelling. She exhibits no deformity, no erythema, no LCL laxity and no MCL laxity. Tenderness found. Medial joint line tenderness noted.  Neurological: She is alert and oriented to person, place, and time.  Skin: Skin is warm and dry. No erythema.  Psychiatric: She has a normal mood and affect. Her behavior is normal.     Assessment/Plan Left knee medial meniscus tear with underlying moderate OA.  Previously treated conservatively with injection without long-term relief. Discussed risks benefits and possible complications of arthroscopy including meniscectomy and debridement and patient wishes to proceed. Unlikely to need microfracture.  This will be done as an outpatient procedure.  Diane [name] 01/03/2015, 4:19 PM

## 2015-01-03 NOTE — Telephone Encounter (Signed)
Pt left v/m at 4:51 pm requesting refill valium to [facility] [address] tonight. Please advise. Pt had annual exam 07/24/14.

## 2015-01-04 ENCOUNTER — Other Ambulatory Visit: Payer: Self-pay | Admitting: *Deleted

## 2015-01-04 MED ORDER — DIAZEPAM 5 MG PO TABS: ORAL_TABLET | ORAL | Status: DC

## 2015-01-04 NOTE — Telephone Encounter (Signed)
Pt requesting medication refill. Last f/u appt 06/2014. pls advise

## 2015-01-04 NOTE — Telephone Encounter (Signed)
Rx called in to requested pharmacy 

## 2015-01-04 NOTE — Telephone Encounter (Signed)
Done on 01/04/15 at different phone note.

## 2015-01-04 NOTE — Telephone Encounter (Signed)
Pt called for status of valium refill; advised pt called in today at 8:55 AM. Pt will ck with pharmacy.

## 2015-01-05 ENCOUNTER — Ambulatory Visit (HOSPITAL_BASED_OUTPATIENT_CLINIC_OR_DEPARTMENT_OTHER)
Admission: RE | Admit: 2015-01-05 | Discharge: 2015-01-05 | Disposition: A | Discharge status: Home / Self Care | Payer: 59 | Source: Ambulatory Visit | Attending: Orthopedic Surgery | Admitting: Orthopedic Surgery

## 2015-01-05 ENCOUNTER — Encounter (HOSPITAL_BASED_OUTPATIENT_CLINIC_OR_DEPARTMENT_OTHER): Payer: Self-pay | Admitting: Certified Registered"

## 2015-01-05 ENCOUNTER — Ambulatory Visit (HOSPITAL_BASED_OUTPATIENT_CLINIC_OR_DEPARTMENT_OTHER): Payer: 59 | Admitting: Certified Registered"

## 2015-01-05 ENCOUNTER — Encounter (HOSPITAL_BASED_OUTPATIENT_CLINIC_OR_DEPARTMENT_OTHER)
Admission: RE | Disposition: A | Discharge status: Home / Self Care | Payer: Self-pay | Source: Ambulatory Visit | Attending: Orthopedic Surgery

## 2015-01-05 DIAGNOSIS — I2510 Atherosclerotic heart disease of native coronary artery without angina pectoris: Secondary | ICD-10-CM | POA: Insufficient documentation

## 2015-01-05 DIAGNOSIS — M25562 Pain in left knee: Secondary | ICD-10-CM | POA: Insufficient documentation

## 2015-01-05 DIAGNOSIS — I10 Essential (primary) hypertension: Secondary | ICD-10-CM | POA: Insufficient documentation

## 2015-01-05 DIAGNOSIS — S83242A Other tear of medial meniscus, current injury, left knee, initial encounter: Secondary | ICD-10-CM | POA: Diagnosis not present

## 2015-01-05 DIAGNOSIS — E785 Hyperlipidemia, unspecified: Secondary | ICD-10-CM | POA: Insufficient documentation

## 2015-01-05 DIAGNOSIS — M1990 Unspecified osteoarthritis, unspecified site: Secondary | ICD-10-CM | POA: Insufficient documentation

## 2015-01-05 DIAGNOSIS — K219 Gastro-esophageal reflux disease without esophagitis: Secondary | ICD-10-CM | POA: Insufficient documentation

## 2015-01-05 DIAGNOSIS — Z8673 Personal history of transient ischemic attack (TIA), and cerebral infarction without residual deficits: Secondary | ICD-10-CM | POA: Insufficient documentation

## 2015-01-05 HISTORY — DX: Unspecified osteoarthritis, unspecified site: M19.90

## 2015-01-05 HISTORY — PX: KNEE ARTHROSCOPY WITH MEDIAL MENISECTOMY: SHX5651

## 2015-01-05 HISTORY — DX: Other specified postprocedural states: R11.2

## 2015-01-05 HISTORY — DX: Other specified postprocedural states: Z98.890

## 2015-01-05 HISTORY — PX: CHONDROPLASTY: SHX5177

## 2015-01-05 HISTORY — DX: Unspecified hearing loss, right ear: H91.91

## 2015-01-05 LAB — POCT HEMOGLOBIN-HEMACUE: Hemoglobin: 13.5 g/dL (ref 12.0–15.0)

## 2015-01-05 SURGERY — ARTHROSCOPY, KNEE, WITH MEDIAL MENISCECTOMY
Anesthesia: General | Site: Knee | Laterality: Left

## 2015-01-05 MED ORDER — PROMETHAZINE HCL 25 MG/ML IJ SOLN: 6.2500 mg | INTRAMUSCULAR | Status: DC | PRN

## 2015-01-05 MED ORDER — HYDROMORPHONE HCL 1 MG/ML IJ SOLN
INTRAMUSCULAR | Status: AC
  Filled 2015-01-05: qty 1

## 2015-01-05 MED ORDER — METOCLOPRAMIDE HCL 5 MG/ML IJ SOLN
5.0000 mg | Freq: Three times a day (TID) | INTRAMUSCULAR | Status: DC | PRN
Start: 2015-01-05 — End: 2015-01-05

## 2015-01-05 MED ORDER — SENNOSIDES-DOCUSATE SODIUM 8.6-50 MG PO TABS: 1.0000 | ORAL_TABLET | Freq: Every evening | ORAL | Status: DC | PRN

## 2015-01-05 MED ORDER — FENTANYL CITRATE 0.05 MG/ML IJ SOLN
INTRAMUSCULAR | Status: AC
  Filled 2015-01-05: qty 4

## 2015-01-05 MED ORDER — FENTANYL CITRATE 0.05 MG/ML IJ SOLN
INTRAMUSCULAR | Status: DC | PRN
  Administered 2015-01-05 (×2): 25 ug via INTRAVENOUS
  Administered 2015-01-05 (×2): 50 ug via INTRAVENOUS

## 2015-01-05 MED ORDER — DOCUSATE SODIUM 100 MG PO CAPS: 100.0000 mg | ORAL_CAPSULE | Freq: Two times a day (BID) | ORAL | Status: DC

## 2015-01-05 MED ORDER — ONDANSETRON HCL 4 MG PO TABS: 4.0000 mg | ORAL_TABLET | Freq: Four times a day (QID) | ORAL | Status: DC | PRN

## 2015-01-05 MED ORDER — EPINEPHRINE HCL 1 MG/ML IJ SOLN
INTRAMUSCULAR | Status: AC
  Filled 2015-01-05: qty 1

## 2015-01-05 MED ORDER — LIDOCAINE HCL (CARDIAC) 20 MG/ML IV SOLN
INTRAVENOUS | Status: DC | PRN
  Administered 2015-01-05: 60 mg via INTRAVENOUS

## 2015-01-05 MED ORDER — MIDAZOLAM HCL 2 MG/2ML IJ SOLN
INTRAMUSCULAR | Status: AC
  Filled 2015-01-05: qty 2

## 2015-01-05 MED ORDER — MIDAZOLAM HCL 5 MG/5ML IJ SOLN
INTRAMUSCULAR | Status: DC | PRN
  Administered 2015-01-05: 1 mg via INTRAVENOUS

## 2015-01-05 MED ORDER — HYDROMORPHONE HCL 1 MG/ML IJ SOLN: 0.5000 mg | INTRAMUSCULAR | Status: DC | PRN

## 2015-01-05 MED ORDER — OXYCODONE HCL 5 MG/5ML PO SOLN: 5.0000 mg | Freq: Once | ORAL | Status: DC | PRN

## 2015-01-05 MED ORDER — SODIUM CHLORIDE 0.9 % IV SOLN: INTRAVENOUS | Status: DC

## 2015-01-05 MED ORDER — BISACODYL 10 MG RE SUPP: 10.0000 mg | Freq: Every day | RECTAL | Status: DC | PRN

## 2015-01-05 MED ORDER — FLEET ENEMA 7-19 GM/118ML RE ENEM: 1.0000 | ENEMA | Freq: Once | RECTAL | Status: DC | PRN

## 2015-01-05 MED ORDER — METHYLPREDNISOLONE ACETATE 40 MG/ML IJ SUSP
INTRAMUSCULAR | Status: AC
  Filled 2015-01-05: qty 1

## 2015-01-05 MED ORDER — METOCLOPRAMIDE HCL 5 MG PO TABS: 5.0000 mg | ORAL_TABLET | Freq: Three times a day (TID) | ORAL | Status: DC | PRN

## 2015-01-05 MED ORDER — EPINEPHRINE HCL 1 MG/ML IJ SOLN
INTRAMUSCULAR | Status: DC | PRN
  Administered 2015-01-05: 6000 mL

## 2015-01-05 MED ORDER — FENTANYL CITRATE 0.05 MG/ML IJ SOLN: 50.0000 ug | INTRAMUSCULAR | Status: DC | PRN

## 2015-01-05 MED ORDER — METHYLPREDNISOLONE ACETATE 80 MG/ML IJ SUSP
INTRAMUSCULAR | Status: AC
  Filled 2015-01-05: qty 1

## 2015-01-05 MED ORDER — OXYCODONE HCL 5 MG PO TABS: 5.0000 mg | ORAL_TABLET | Freq: Once | ORAL | Status: DC | PRN

## 2015-01-05 MED ORDER — HYDROCODONE-ACETAMINOPHEN 10-325 MG PO TABS: 1.0000 | ORAL_TABLET | ORAL | Status: DC | PRN

## 2015-01-05 MED ORDER — MIDAZOLAM HCL 2 MG/2ML IJ SOLN: 1.0000 mg | INTRAMUSCULAR | Status: DC | PRN

## 2015-01-05 MED ORDER — CHLORHEXIDINE GLUCONATE 4 % EX LIQD: 60.0000 mL | Freq: Once | CUTANEOUS | Status: DC

## 2015-01-05 MED ORDER — SCOPOLAMINE 1 MG/3DAYS TD PT72
MEDICATED_PATCH | TRANSDERMAL | Status: AC
  Filled 2015-01-05: qty 1

## 2015-01-05 MED ORDER — BUPIVACAINE-EPINEPHRINE (PF) 0.5% -1:200000 IJ SOLN
INTRAMUSCULAR | Status: AC
  Filled 2015-01-05: qty 30

## 2015-01-05 MED ORDER — CEFAZOLIN SODIUM-DEXTROSE 2-3 GM-% IV SOLR
2.0000 g | INTRAVENOUS | Status: AC
  Administered 2015-01-05: 2 g via INTRAVENOUS

## 2015-01-05 MED ORDER — DEXAMETHASONE SODIUM PHOSPHATE 10 MG/ML IJ SOLN
INTRAMUSCULAR | Status: DC | PRN
  Administered 2015-01-05: 10 mg via INTRAVENOUS

## 2015-01-05 MED ORDER — BUPIVACAINE-EPINEPHRINE 0.5% -1:200000 IJ SOLN
INTRAMUSCULAR | Status: DC | PRN
  Administered 2015-01-05: 20 mL

## 2015-01-05 MED ORDER — SCOPOLAMINE 1 MG/3DAYS TD PT72
1.0000 | MEDICATED_PATCH | TRANSDERMAL | Status: DC
  Administered 2015-01-05: 1.5 mg via TRANSDERMAL

## 2015-01-05 MED ORDER — ONDANSETRON HCL 4 MG/2ML IJ SOLN
INTRAMUSCULAR | Status: DC | PRN
  Administered 2015-01-05: 4 mg via INTRAVENOUS

## 2015-01-05 MED ORDER — LACTATED RINGERS IV SOLN
INTRAVENOUS | Status: DC
  Administered 2015-01-05: 10:00:00 via INTRAVENOUS
  Administered 2015-01-05: 10 mL/h via INTRAVENOUS
  Administered 2015-01-05: 09:00:00 via INTRAVENOUS

## 2015-01-05 MED ORDER — PROPOFOL 10 MG/ML IV BOLUS
INTRAVENOUS | Status: AC
  Filled 2015-01-05: qty 40

## 2015-01-05 MED ORDER — METHYLPREDNISOLONE ACETATE 80 MG/ML IJ SUSP
INTRAMUSCULAR | Status: DC | PRN
  Administered 2015-01-05: 80 mg via INTRA_ARTICULAR

## 2015-01-05 MED ORDER — CEFAZOLIN SODIUM-DEXTROSE 2-3 GM-% IV SOLR
INTRAVENOUS | Status: AC
  Filled 2015-01-05: qty 50

## 2015-01-05 MED ORDER — HYDROMORPHONE HCL 1 MG/ML IJ SOLN
0.2500 mg | INTRAMUSCULAR | Status: DC | PRN
  Administered 2015-01-05 (×2): 0.5 mg via INTRAVENOUS

## 2015-01-05 MED ORDER — ONDANSETRON HCL 4 MG/2ML IJ SOLN: 4.0000 mg | Freq: Four times a day (QID) | INTRAMUSCULAR | Status: DC | PRN

## 2015-01-05 MED ORDER — PROPOFOL 10 MG/ML IV BOLUS
INTRAVENOUS | Status: DC | PRN
  Administered 2015-01-05: 150 mg via INTRAVENOUS

## 2015-01-05 SURGICAL SUPPLY — 37 items
BANDAGE ELASTIC 6 VELCRO ST LF (GAUZE/BANDAGES/DRESSINGS) ×3 IMPLANT
BANDAGE ESMARK 6X9 LF (GAUZE/BANDAGES/DRESSINGS) IMPLANT
BLADE 4.2CUDA (BLADE) ×3 IMPLANT
BLADE CUDA GRT WHITE 3.5 (BLADE) ×3 IMPLANT
BNDG ESMARK 6X9 LF (GAUZE/BANDAGES/DRESSINGS)
BNDG GAUZE ELAST 4 BULKY (GAUZE/BANDAGES/DRESSINGS) ×3 IMPLANT
BRUSH SCRUB EZ PLAIN DRY (MISCELLANEOUS) ×3 IMPLANT
CANISTER SUCT 3000ML (MISCELLANEOUS) IMPLANT
DRAPE ARTHROSCOPY W/POUCH 114 (DRAPES) ×3 IMPLANT
DRSG EMULSION OIL 3X3 NADH (GAUZE/BANDAGES/DRESSINGS) ×3 IMPLANT
DURAPREP 26ML APPLICATOR (WOUND CARE) ×3 IMPLANT
GAUZE SPONGE 4X4 12PLY STRL (GAUZE/BANDAGES/DRESSINGS) ×3 IMPLANT
GLOVE BIO SURGEON STRL SZ7.5 (GLOVE) ×3 IMPLANT
GLOVE BIOGEL M STRL SZ7.5 (GLOVE) ×3 IMPLANT
GLOVE BIOGEL PI IND STRL 8 (GLOVE) ×2 IMPLANT
GLOVE BIOGEL PI INDICATOR 8 (GLOVE) ×4
GLOVE SURG ORTHO 8.0 STRL STRW (GLOVE) ×3 IMPLANT
GOWN STRL REUS W/ TWL LRG LVL3 (GOWN DISPOSABLE) IMPLANT
GOWN STRL REUS W/ TWL XL LVL3 (GOWN DISPOSABLE) ×2 IMPLANT
GOWN STRL REUS W/TWL LRG LVL3 (GOWN DISPOSABLE)
GOWN STRL REUS W/TWL XL LVL3 (GOWN DISPOSABLE) ×4
HOLDER KNEE FOAM BLUE (MISCELLANEOUS) ×3 IMPLANT
KNEE WRAP E Z 3 GEL PACK (MISCELLANEOUS) ×3 IMPLANT
MANIFOLD NEPTUNE II (INSTRUMENTS) ×3 IMPLANT
NDL SAFETY ECLIPSE 18X1.5 (NEEDLE) ×1 IMPLANT
NEEDLE HYPO 18GX1.5 SHARP (NEEDLE) ×2
PACK ARTHROSCOPY DSU (CUSTOM PROCEDURE TRAY) ×3 IMPLANT
PACK BASIN DAY SURGERY FS (CUSTOM PROCEDURE TRAY) ×3 IMPLANT
SET ARTHROSCOPY TUBING (MISCELLANEOUS) ×2
SET ARTHROSCOPY TUBING LN (MISCELLANEOUS) ×1 IMPLANT
SUT ETHILON 4 0 PS 2 18 (SUTURE) ×3 IMPLANT
SYR 5ML LL (SYRINGE) ×3 IMPLANT
TOWEL OR 17X24 6PK STRL BLUE (TOWEL DISPOSABLE) ×3 IMPLANT
WAND 3.0 CAPSURE 30 DEG W/CORD (SURGICAL WAND) IMPLANT
WAND 30 DEG SABER W/CORD (SURGICAL WAND) IMPLANT
WAND STAR VAC 90 (SURGICAL WAND) IMPLANT
WATER STERILE IRR 1000ML POUR (IV SOLUTION) ×3 IMPLANT

## 2015-01-05 NOTE — Anesthesia Preprocedure Evaluation (Addendum)
Anesthesia Evaluation  Patient identified by MRN, date of birth, ID band Patient awake    Reviewed: Allergy & Precautions, NPO status , Patient's Chart, lab work & pertinent test results  History of Anesthesia Complications (+) PONV  Airway Mallampati: III  TM Distance: >3 FB Neck ROM: Full    Dental  (+) Teeth Intact   Pulmonary neg pulmonary ROS,          Cardiovascular hypertension, + CAD (Non-obstructive CAD)     Neuro/Psych Anxiety CVA, Residual Symptoms    GI/Hepatic Neg liver ROS, GERD-  ,  Endo/Other  Morbid obesity  Renal/GU negative Renal ROS     Musculoskeletal  (+) Arthritis -,   Abdominal   Peds  Hematology negative hematology ROS (+)   Anesthesia Other Findings   Reproductive/Obstetrics                            Anesthesia Physical Anesthesia Plan  ASA: III  Anesthesia Plan: General   Post-op Pain Management:    Induction: Intravenous  Airway Management Planned: LMA  Additional Equipment:   Intra-op Plan:   Post-operative Plan:   Informed Consent: I have reviewed the patients History and Physical, chart, labs and discussed the procedure including the risks, benefits and alternatives for the proposed anesthesia with the patient or authorized representative who has indicated his/her understanding and acceptance.   Dental advisory given  Plan Discussed with: CRNA  Anesthesia Plan Comments:         Anesthesia Quick Evaluation

## 2015-01-05 NOTE — Anesthesia Procedure Notes (Signed)
Procedure Name: LMA Insertion Date/Time: 01/05/2015 10:11 AM Performed by: Baxter Flattery Pre-anesthesia Checklist: Patient identified, Emergency Drugs available, Suction available and Patient being monitored Patient Re-evaluated:Patient Re-evaluated prior to inductionOxygen Delivery Method: Circle System Utilized Preoxygenation: Pre-oxygenation with 100% oxygen Intubation Type: IV induction Ventilation: Mask ventilation without difficulty LMA: LMA inserted LMA Size: 4.0 Number of attempts: 1 Airway Equipment and Method: bite block Placement Confirmation: positive ETCO2 and breath sounds checked- equal and bilateral Tube secured with: Tape Dental Injury: Teeth and Oropharynx as per pre-operative assessment

## 2015-01-05 NOTE — Transfer of Care (Signed)
Immediate Anesthesia Transfer of Care Note  Patient: Diane [name]  Procedure(s) Performed: Procedure(s): LEFT KNEE ARTHROSCOPY WITH MEDIAL AND LATERAL MENISECTOMY; DEBRIDEMENT LATERAL PATELLA FEMORAL  (Left) CHONDROPLASTY (Left)  Patient Location: PACU  Anesthesia Type:General  Level of Consciousness: awake, alert  and oriented  Airway & Oxygen Therapy: Patient Spontanous Breathing and Patient connected to face mask oxygen  Post-op Assessment: Report given to PACU RN, Post -op Vital signs reviewed and stable and Patient moving all extremities  Post vital signs: Reviewed and stable  Complications: No apparent anesthesia complications

## 2015-01-05 NOTE — H&P (View-Only) (Signed)
Diane [name] is an 64 y.o. female.   Chief Complaint: left knee medial meniscus tear HPI: Ms. Kuc is a 64 year-old female with a several month history of left knee pain.  She reports her knee has been achy and sore and has progressed to severe pain.  The pain is located along the medial joint line.  It is worse with weight bearing, better with rest.  She was seen in the emergency room, had some x-rays done and was given some Hydrocodone tablets.  She said that has helped a little bit.  She has done a small amount of anti-inflammatories as well.  She is describing severe pain along the medial joint line.  Worse when she weight bears and twists.  Better with rest.  She is also reporting a fair amount of swelling. Some giving way symptoms.   She denies any prior history of knee problems in the past.  We elected to obtain MRI left knee which shows medial meniscus tear with underlying OA.    Past Medical History  Diagnosis Date  . 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 Hypertension   . GERD (gastroesophageal reflux disease)   . Hyperlipidemia   . Ejection fraction   . CAD (coronary artery disease)   . Arthritis   . PONV (postoperative nausea and vomiting)   . 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 Cesarean section    . Left heart catheterization with coronary angiogram N/A 10/28/2013    Procedure: LEFT HEART CATHETERIZATION WITH CORONARY ANGIOGRAM;  Surgeon: Dalton S McLean, MD;  Location: [facility] CATH LAB;  Service: Cardiovascular;  Laterality: N/A;  . Tubal ligation    . Cholecystectomy  1990    Family History  Problem Relation Age of Onset  . Stroke Father   . Heart failure Father    Social History:  reports that she has never smoked. She has never used smokeless tobacco. She reports that she drinks alcohol. She reports that she does not use illicit drugs.  Allergies: No Known  Allergies   (Not in a hospital admission)  Results for orders placed or performed during the hospital encounter of 01/05/15 (from the past 48 hour(s))  Basic metabolic panel     Status: Abnormal   Collection Time: 01/03/15  8:35 AM  Result Value Ref Range   Sodium 139 135 - 145 mmol/L    Comment: Please note change in reference range.   Potassium 3.8 3.5 - 5.1 mmol/L    Comment: Please note change in reference range.   Chloride 101 96 - 112 mEq/L   CO2 28 19 - 32 mmol/L   Glucose, Bld 138 (H) 70 - 99 mg/dL   BUN 12 6 - 23 mg/dL   Creatinine, Ser 0.74 0.50 - 1.10 mg/dL   Calcium 10.0 8.4 - 10.5 mg/dL   GFR calc non Af Amer 89 (L) >90 mL/min   GFR calc Af Amer >90 >90 mL/min    Comment: (NOTE) The eGFR has been calculated using the CKD EPI equation. This calculation has not been validated in all clinical situations. eGFR's persistently <90 mL/min signify possible Chronic Kidney Disease.    Anion gap 10 5 - 15   No results found.  Review of Systems  Constitutional: Negative.   HENT: Positive for hearing loss. Negative for congestion, ear discharge, ear pain, nosebleeds,   sore throat and tinnitus.   Eyes: Negative.   Respiratory: Negative.  Negative for stridor.   Cardiovascular: Negative.   Gastrointestinal: Negative.   Genitourinary: Negative.   Musculoskeletal: Positive for joint pain. Negative for falls.  Skin: Negative.   Neurological: Positive for headaches. Negative for dizziness, tingling, tremors, sensory change, speech change, focal weakness, seizures and loss of consciousness.  Endo/Heme/Allergies: Negative.   Psychiatric/Behavioral: Positive for depression. Negative for suicidal ideas, hallucinations, memory loss and substance abuse. The patient is not nervous/anxious and does not have insomnia.     There were no vitals taken for this visit. Physical Exam  Constitutional: She is oriented to person, place, and time. She appears well-developed and well-nourished.  No distress.  HENT:  Head: Normocephalic and atraumatic.  Nose: Nose normal.  Eyes: Conjunctivae and EOM are normal. Pupils are equal, round, and reactive to light.  Neck: Normal range of motion. Neck supple.  Cardiovascular: Normal rate and intact distal pulses.   Respiratory: Effort normal. No respiratory distress.  GI: Soft. She exhibits no distension. There is no tenderness.  Musculoskeletal:       Left knee: She exhibits swelling. She exhibits no deformity, no erythema, no LCL laxity and no MCL laxity. Tenderness found. Medial joint line tenderness noted.  Neurological: She is alert and oriented to person, place, and time.  Skin: Skin is warm and dry. No erythema.  Psychiatric: She has a normal mood and affect. Her behavior is normal.     Assessment/Plan Left knee medial meniscus tear with underlying moderate OA.  Previously treated conservatively with injection without long-term relief. Discussed risks benefits and possible complications of arthroscopy including meniscectomy and debridement and patient wishes to proceed. Unlikely to need microfracture.  This will be done as an outpatient procedure.  Chriss Czar 01/03/2015, 4:19 PM

## 2015-01-05 NOTE — Interval H&P Note (Signed)
History and Physical Interval Note:  01/05/2015 7:26 AM  Diane [name]  has presented today for surgery, with the diagnosis of CHONDROMALACIA PATELLAE,LEFT KNEE. OTHER TEAR OF MEDIAL MENISCUS,CURRENT INJURY,LEFT KNEE  The various methods of treatment have been discussed with the patient and family. After consideration of risks, benefits and other options for treatment, the patient has consented to  Procedure(s): LEFT KNEE ARTHROSCOPY WITH MEDIAL MENISECTOMY (Left) as a surgical intervention .  The patient's history has been reviewed, patient examined, no change in status, stable for surgery.  I have reviewed the patient's chart and labs.  Questions were answered to the patient's satisfaction.     Wane Mollett JR,W D

## 2015-01-05 NOTE — Discharge Instructions (Signed)
Diet: As you were doing prior to hospitalization   Activity: Increase activity slowly as tolerated  No lifting or driving for  2 weeks   Shower: May shower without a dressing on post op day #2, NO SOAKING in tub   Dressing: You may change your dressing on post op day #2.  Then change the dressing daily with sterile 4"x4"s gauze dressing or bandaids  Weight Bearing: weight bearing as tolerated. Use a walker or  Crutches as needed.   To prevent constipation: you may use a stool softener such as -  Colace ( over the counter) 100 mg by mouth twice a day  Drink plenty of fluids ( prune juice may be helpful) and high fiber foods  Miralax ( over the counter) for constipation as needed.   Precautions: If you experience chest pain or shortness of breath - call 911 immediately For transfer to the hospital emergency department!!  If you develop a fever greater that 101 F, purulent drainage from wound, increased redness or drainage from wound, or calf pain -- Call the office   Follow- Up Appointment: Please call for an appointment to be seen in 1 weeks  [place] - [phone]  Post Anesthesia Home Care Instructions  Activity: Get plenty of rest for the remainder of the day. A responsible adult should stay with you for 24 hours following the procedure.  For the next 24 hours, DO NOT: -Drive a car -Paediatric nurse -Drink alcoholic beverages -Take any medication unless instructed by your physician -Make any legal decisions or sign important papers.  Meals: Start with liquid foods such as gelatin or soup. Progress to regular foods as tolerated. Avoid greasy, spicy, heavy foods. If nausea and/or vomiting occur, drink only clear liquids until the nausea and/or vomiting subsides. Call your physician if vomiting continues.  Special Instructions/Symptoms: Your throat may feel dry or sore from the anesthesia or the breathing tube placed in your throat during surgery. If this causes discomfort,  gargle with warm salt water. The discomfort should disappear within 24 hours.

## 2015-01-05 NOTE — Anesthesia Postprocedure Evaluation (Signed)
  Anesthesia Post-op Note  Patient: Diane [name]  Procedure(s) Performed: Procedure(s): LEFT KNEE ARTHROSCOPY WITH MEDIAL AND LATERAL MENISECTOMY; DEBRIDEMENT LATERAL PATELLA FEMORAL  (Left) CHONDROPLASTY (Left)  Patient Location: PACU  Anesthesia Type:General  Level of Consciousness: awake and alert   Airway and Oxygen Therapy: Patient Spontanous Breathing  Post-op Pain: moderate  Post-op Assessment: Post-op Vital signs reviewed, Patient's Cardiovascular Status Stable and Respiratory Function Stable  Post-op Vital Signs: Reviewed  Filed Vitals:   01/05/15 1130  BP: 124/84  Pulse: 64  Temp:   Resp: 20    Complications: No apparent anesthesia complications

## 2015-01-08 ENCOUNTER — Other Ambulatory Visit: Payer: Self-pay | Admitting: *Deleted

## 2015-01-08 NOTE — Op Note (Signed)
NAME:  Diane [name], Diane [name] NO.:  [id number]  MEDICAL RECORD NO.:  [id number]  LOCATION:                                 FACILITY:  PHYSICIAN:  Lockie Pares, M.D.    DATE OF BIRTH:  Dec 20, 1951  DATE OF PROCEDURE:  01/05/2015 DATE OF DISCHARGE:  01/05/2015                              OPERATIVE REPORT   INDICATIONS:  This is a 64 year old, MRI proven symptomatic left knee torn medial meniscus, osteoarthritis of the knee , thought to be amenable to outpatient surgery.  PREOPERATIVE DIAGNOSES: 1. Complex tear of posterior horn of medial meniscus, 1, 2, grade 3     osteoarthritis of the medial compartment. 2. Grade 2 osteoarthritis of lateral compartment. 3. 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  POSTOPERATIVE DIAGNOSES: 1. Complex tear of posterior horn of medial meniscus, 1, 2, grade 3     osteoarthritis of the medial compartment. 2. Grade 2 osteoarthritis of lateral compartment. 3. 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  OPERATION: 1. Partial medial meniscectomy (approximately 50%). 2. Debridement chondroplasty of the patellofemoral joint and medial     lateral compartment. 3. Abrasion arthroplasty of the trochlear groove.  SURGEON:  Lockie Pares, M.D.  ANESTHESIA:  General with local supplementation.  PROCEDURE:  Inferomedial lateral portals were made.  Systematic inspection of the knee showed the patient to have a significant chondral regulated the trochlear groove with some involvement of the patella but more so on the groove which showed grade 3 and advanced grade 4.  We did do some abrasion on the grade 4 area, central groove chondromalacia.  Lateral compartment showed a frank type tear of the meniscus requiring resection about 10% to 50% of the meniscus substance and very mild chondral irregularity on the femoral condyle with relative sparing of the tibia with about 1 x 2 cm area of the lateral femoral condyle  which was debrided.  The most extensive pathology relative to the surgery was the medial compartment with more of extensive medial meniscectomy for complex tear of the meniscus resection of approximately 50% with the meniscus substance with advanced grade 3 changes diffusely on the whole condyle on the femur with some involvement or less involvement on the tibia. Debridement chondroplasty was carried out.  No grade 4 changes were seen of the medial compartment in the meniscectomy.  The knee was drained free of fluid.  Portals were closed with nylon.  The skin portals were infiltrated with 15-20 mL of Marcaine with additional 5 mL to 10 mL of Marcaine 0.5% with epinephrine and 80 mg of Depo-Medrol intra-articularly.  Lightly compressive sterile dressing was applied and taken to recovery room in stable condition.     Lockie Pares, M.D.     WDC/MEDQ  D:  01/05/2015  T:  01/06/2015  Job:  [id number]

## 2015-01-08 NOTE — Telephone Encounter (Signed)
Received faxed refill request from pharmacy for Fioricet/cod (acetamin 325), take one to two capsules by mouth once daily as needed for pain. Medication is no longer on med sheet. Last refill 11/27/14 #30. Last office visit 07/24/14. Is it okay to refill mediation?

## 2015-01-08 NOTE — Telephone Encounter (Signed)
Yes ok to refill as requested. 

## 2015-01-09 ENCOUNTER — Encounter (HOSPITAL_BASED_OUTPATIENT_CLINIC_OR_DEPARTMENT_OTHER): Payer: Self-pay | Admitting: Orthopedic Surgery

## 2015-01-09 ENCOUNTER — Ambulatory Visit (HOSPITAL_COMMUNITY): Payer: 59 | Attending: Cardiology | Admitting: *Deleted

## 2015-01-09 ENCOUNTER — Other Ambulatory Visit (HOSPITAL_COMMUNITY): Payer: Self-pay | Admitting: *Deleted

## 2015-01-09 DIAGNOSIS — I10 Essential (primary) hypertension: Secondary | ICD-10-CM | POA: Insufficient documentation

## 2015-01-09 DIAGNOSIS — Z9889 Other specified postprocedural states: Secondary | ICD-10-CM | POA: Diagnosis not present

## 2015-01-09 DIAGNOSIS — M79605 Pain in left leg: Secondary | ICD-10-CM | POA: Diagnosis not present

## 2015-01-09 DIAGNOSIS — I2510 Atherosclerotic heart disease of native coronary artery without angina pectoris: Secondary | ICD-10-CM | POA: Insufficient documentation

## 2015-01-09 DIAGNOSIS — E785 Hyperlipidemia, unspecified: Secondary | ICD-10-CM | POA: Insufficient documentation

## 2015-01-09 DIAGNOSIS — E669 Obesity, unspecified: Secondary | ICD-10-CM | POA: Diagnosis not present

## 2015-01-09 NOTE — Telephone Encounter (Signed)
Please verify with pt

## 2015-01-09 NOTE — Telephone Encounter (Signed)
Is this the correct medication? The request does not indicate the dose of the other meds included in this Rx. Med Hx indicates Fioricet/ cod 50-325-40-30, but it looks slightly different in request below. pls advise

## 2015-01-09 NOTE — Progress Notes (Signed)
Lower Extremity Venous Duplex - Limited, Left leg only

## 2015-01-10 NOTE — Telephone Encounter (Signed)
L/m for pt to call office

## 2015-01-11 NOTE — Telephone Encounter (Signed)
Pt states that her old Rx states 50-325-5 as dosing. pls advise

## 2015-01-12 MED ORDER — BUTALBITAL-APAP-CAFFEINE 50-325-40 MG PO TABS: 1.0000 | ORAL_TABLET | Freq: Four times a day (QID) | ORAL | Status: DC | PRN

## 2015-01-12 NOTE — Telephone Encounter (Signed)
Rx called in to requested pharmacy 

## 2015-01-15 MED ORDER — BUTALBITAL-APAP-CAFF-COD 50-325-40-30 MG PO CAPS: ORAL_CAPSULE | ORAL | Status: DC

## 2015-01-15 NOTE — Telephone Encounter (Addendum)
Faxed prescription request from [facility] is on your desk. Patient wants the refill sent to [facility] on [address].

## 2015-01-15 NOTE — Telephone Encounter (Signed)
Patient notified as instructed by telephone. Patient stated that she will call the pharmacy and have them send the correct refill request over to Dr. Deborra Medina.

## 2015-01-15 NOTE — Telephone Encounter (Signed)
Rx signed and in my box.

## 2015-01-15 NOTE — Telephone Encounter (Addendum)
[facility] and [facility] requesting a cb when Fiorinal # 3 is sent to [facility] on [address]. Pt does not want med refilled to [facility] .

## 2015-01-15 NOTE — Telephone Encounter (Signed)
Rx called to pharmacy as instructed. Patient notified by telephone. 

## 2015-01-15 NOTE — Addendum Note (Signed)
Addended by: Emelia Salisbury C on: 01/15/2015 03:35 PM   Modules accepted: Orders, Medications

## 2015-01-15 NOTE — Telephone Encounter (Signed)
Patient request new script to [facility]. Prescription added to medication list, please confirm correct medication.

## 2015-01-15 NOTE — Telephone Encounter (Signed)
No we were trying to prescribe what she was previously taking.  Please have pharmacy send [place] an approriate refill request with correct rx and dose.

## 2015-01-15 NOTE — Telephone Encounter (Signed)
Pt left v/m; [facility]; pt usually takes Fiorinal # 3; pt wants to know if this is something new [name] wants her to try; pt did not pick up med until clarified. pt request cb.

## 2015-02-07 ENCOUNTER — Other Ambulatory Visit: Payer: Self-pay | Admitting: *Deleted

## 2015-02-07 MED ORDER — DIAZEPAM 5 MG PO TABS: ORAL_TABLET | ORAL | Status: DC

## 2015-02-07 NOTE — Telephone Encounter (Signed)
Rx called in to requested pharmacy 

## 2015-02-23 ENCOUNTER — Emergency Department (HOSPITAL_COMMUNITY)
Admission: EM | Admit: 2015-02-23 | Discharge: 2015-02-23 | Discharge status: Against Medical Advice | Payer: 59 | Attending: Emergency Medicine | Admitting: Emergency Medicine

## 2015-02-23 ENCOUNTER — Encounter (HOSPITAL_COMMUNITY): Payer: Self-pay | Admitting: Emergency Medicine

## 2015-02-23 DIAGNOSIS — I10 Essential (primary) hypertension: Secondary | ICD-10-CM | POA: Diagnosis not present

## 2015-02-23 DIAGNOSIS — I2510 Atherosclerotic heart disease of native coronary artery without angina pectoris: Secondary | ICD-10-CM | POA: Diagnosis not present

## 2015-02-23 DIAGNOSIS — Z760 Encounter for issue of repeat prescription: Secondary | ICD-10-CM | POA: Diagnosis present

## 2015-02-23 DIAGNOSIS — H9191 Unspecified hearing loss, right ear: Secondary | ICD-10-CM | POA: Diagnosis not present

## 2015-02-23 DIAGNOSIS — F419 Anxiety disorder, unspecified: Secondary | ICD-10-CM | POA: Diagnosis not present

## 2015-02-23 NOTE — ED Notes (Signed)
Pt sts increased anxiety today; pt sts took her last valium this am; pt sts some chills today as well

## 2015-02-23 NOTE — ED Notes (Signed)
Pt states she is feeling better and would rather go home than sit in a waiting room.

## 2015-02-26 ENCOUNTER — Telehealth: Payer: Self-pay | Admitting: Family Medicine

## 2015-02-26 NOTE — Telephone Encounter (Signed)
PLEASE NOTE: All timestamps contained within this report are represented as [place] Standard Time. CONFIDENTIALTY NOTICE: This fax transmission is intended only for the addressee. It contains information that is legally privileged, confidential or otherwise protected from use or disclosure. If you are not the intended recipient, you are strictly prohibited from reviewing, disclosing, copying using or disseminating any of this information or taking any action in reliance on or regarding this information. If you have received this fax in error, please notify us immediately by telephone so that we can arrange for its return to [place]. Phone: [phone], Toll-Free: [phone], Fax: [phone] Page: 1 of 2 Call Id: [id number] [place] Patient Name: Diane [name] Gender: Female DOB: 02-07-1951 Age: 64 Y 63 M 19 D Return Phone Number: [phone] (Primary) Address: [address] City/State/Zip: [place] [address] Client [facility] - Client Client Site [place] - Day Physician [place], [place] Type Call Call Type Triage / Clinical Relationship To Patient Self Appointment Disposition EMR Appointment Not Necessary Return Phone Number [phone] (Primary) Chief Complaint Anxiety and Panic Attack Initial Comment Caller states that she made an appointment for Tuesday, for anxiety and depression problems, on rx - is shakey, stomach hurts, cant eat PreDisposition InappropriateToAsk Info pasted into Epic No Nurse Assessment Nurse: Glennon Mac, RN, Museum/gallery conservator Date/Time (Eastern Time): 02/23/2015 5:03:58 PM Confirm and document reason for call. If symptomatic, describe symptoms. ---Caller states that she made an appointment for Tuesday, for anxiety and depression problem. She is now on Celexa 40mg  PO HS, she is shakey, stomach hurts, cant eat. She is  out of her Valium for her as needed basis. Has the patient traveled out of the country within the last 30 days? ---No Does the patient require triage? ---Yes Related visit to physician within the last 2 weeks? ---No Does the PT have any chronic conditions? (i.e. diabetes, asthma, etc.) ---Yes List chronic conditions. ---HTN Depression Anxiety Guidelines Guideline Title Affirmed Question Affirmed Notes Nurse Date/Time (Eastern Time) Anxiety and Panic Attack [1] Known or suspected alcohol or drug abuse AND [2] feeling very shaky (i.e., visible tremors of hands) Glennon Mac, RN, [employer] 02/23/2015 5:07:13 PM Disp. Time Eilene Ghazi Time) Disposition Final User 02/23/2015 5:12:35 PM Go to ED Now (or PCP triage) Yes Glennon Mac, RN, Amber PLEASE NOTE: All timestamps contained within this report are represented as [place] Standard Time. CONFIDENTIALTY NOTICE: This fax transmission is intended only for the addressee. It contains information that is legally privileged, confidential or otherwise protected from use or disclosure. If you are not the intended recipient, you are strictly prohibited from reviewing, disclosing, copying using or disseminating any of this information or taking any action in reliance on or regarding this information. If you have received this fax in error, please notify us immediately by telephone so that we can arrange for its return to [place]. Phone: [phone], Toll-Free: [phone], Fax: [phone] Page: 2 of 2 Call Id: [id number] Caller Understands: Yes Disagree/Comply: Comply Care Advice Given Per Guideline GO TO ED NOW (OR PCP TRIAGE): * IF NO PCP TRIAGE: You need to be seen. Go to the [facility] at _____________ Hospital within the next hour. Leave as soon as you can. CARE ADVICE given per Anxiety and Panic Attack (Adult) guideline. BRING MEDICINES: * Please bring a list of your current medicines when you go to the Emergency Department (ER). * It is also a good idea to bring  the pill bottles too. This will help the doctor to make certain you are taking the right medicines and the right dose. After Care Instructions Given Call Event Type User Date / Time Description Comments User: Madaline Guthrie, RN Date/Time Eilene Ghazi Time): 02/23/2015 5:14:00 PM Pt states she has been taking the Valium 5mg  PO Q12HPRN more often than prescribed to help with anxiety and now is out of that medication. Referrals [facility] - ED

## 2015-02-26 NOTE — Telephone Encounter (Signed)
Lm on pts vm requesting a call back 

## 2015-02-26 NOTE — Telephone Encounter (Signed)
It looks like her valium was refilled on 2/10- please call to check on pt.

## 2015-02-27 ENCOUNTER — Encounter: Payer: Self-pay | Admitting: Family Medicine

## 2015-02-27 ENCOUNTER — Ambulatory Visit (INDEPENDENT_AMBULATORY_CARE_PROVIDER_SITE_OTHER): Payer: 59 | Admitting: Family Medicine

## 2015-02-27 VITALS — BP 144/82 | HR 73 | Temp 97.9°F | Wt 204.5 lb

## 2015-02-27 DIAGNOSIS — Z23 Encounter for immunization: Secondary | ICD-10-CM

## 2015-02-27 DIAGNOSIS — F411 Generalized anxiety disorder: Secondary | ICD-10-CM

## 2015-02-27 MED ORDER — BUTALBITAL-APAP-CAFF-COD 50-325-40-30 MG PO CAPS: ORAL_CAPSULE | ORAL | Status: DC

## 2015-02-27 MED ORDER — SERTRALINE HCL 25 MG PO TABS: 25.0000 mg | ORAL_TABLET | Freq: Every day | ORAL | Status: DC

## 2015-02-27 MED ORDER — DIAZEPAM 5 MG PO TABS: ORAL_TABLET | ORAL | Status: DC

## 2015-02-27 NOTE — Progress Notes (Signed)
Pre visit review using our clinic review tool, if applicable. No additional management support is needed unless otherwise documented below in the visit note. 

## 2015-02-27 NOTE — Assessment & Plan Note (Signed)
Deteriorated. >25 minutes spent in face to face time with patient, >50% spent in counselling or coordination of care Now with symptoms of depression as well. After discussing tx options, she would like to wean off celexa and try another antidepressant. Discussed weaning- see AVS for instructions. Start low dose zoloft 25 mg daily- will increase this in a few weeks once she has weaned off celexa, if symptoms still not well controlled. Refilled valium to use prn as well. Referred for psychotherapy. She will call me in a few weeks with an update. The patient indicates understanding of these issues and agrees with the plan.

## 2015-02-27 NOTE — Progress Notes (Signed)
Subjective:    Patient ID: Diane [name], female    DOB: [birth year], 64 y.o.   MRN: [id number]  HPI  Very pleasant 64 yo here for follow up with her husband today.   GAD- deteriorated on celexa 40 mg daily and had been working well. She did see Dr. Damita Dunnings while I was out on maternity leave on 11/03/13 for worsening anxiety- note reviewed.  Advised to continue celexa and given rx for prn valium.  She recently ran out of valium and was having increased panic attacks.  Was actually seen in ED recently due to panic attack.  Multiple stressors- 49 year old daughter quit school and "ran off" with a boyfriend that has not been very good to her.  Since this happened, Ann can't sleep.  Decreased appetite and less joy in life.  Does not want to get out of bed some mornings.  Feels extremely anxious all the time.  Denies SI or HI.  Also had knee surgery in 12/2014 and has not been recovering as quickly as she hoped.  She is used to being very active so this has been discouraging to her as well.   Has not taken any other antidepressants.  Husband very supportive but he also feels she "needs help."  Has never seen a psychotherapist or psychiatrist.  Wt Readings from Last 3 Encounters:  02/27/15 204 lb 8 oz (92.761 kg)  01/05/15 202 lb (91.627 kg)  07/24/14 196 lb (88.905 kg)    Patient Active Problem List   Diagnosis Date Noted  . Carotid bruit 11/04/2013  . Hyperlipidemia   . Ejection fraction   . CAD (coronary artery disease)   . History of CVA (cerebrovascular accident) 10/28/2013  . Dyslipidemia 10/28/2013  . Dyspnea on exertion 10/28/2013  . Generalized anxiety disorder 04/11/2013  . Routine general medical examination at a health care facility 04/11/2013  . GERD (gastroesophageal reflux disease) 02/02/2013  . Hypertension 02/02/2013   Past Medical History  Diagnosis Date  . 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 Hypertension   . GERD  (gastroesophageal reflux disease)   . Hyperlipidemia   . Ejection fraction   . CAD (coronary artery disease)   . Arthritis   . PONV (postoperative nausea and vomiting)   . 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 Cesarean section    . Left heart catheterization with coronary angiogram N/A 10/28/2013    Procedure: LEFT HEART CATHETERIZATION WITH CORONARY ANGIOGRAM;  Surgeon: Larey Dresser, MD;  Location: [facility] CATH LAB;  Service: Cardiovascular;  Laterality: N/A;  . Tubal ligation    . Cholecystectomy  1990  . Knee arthroscopy with medial menisectomy Left 01/05/2015    Procedure: LEFT KNEE ARTHROSCOPY WITH MEDIAL AND LATERAL MENISECTOMY; DEBRIDEMENT LATERAL PATELLA FEMORAL ;  Surgeon: Yvette Rack., MD;  Location: [facility];  Service: Orthopedics;  Laterality: Left;  . Chondroplasty Left 01/05/2015    Procedure: CHONDROPLASTY;  Surgeon: Yvette Rack., MD;  Location: [facility];  Service: Orthopedics;  Laterality: Left;   History  Substance Use Topics  . Smoking status: Never Smoker   . Smokeless tobacco: Never Used     Comment: LIVES WITH 2 SMOKERS   . Alcohol Use: Yes     Comment: Rare social ETOH   Family History  Problem Relation Age of Onset  .  Stroke Father   . Heart failure Father    No Known Allergies Current Outpatient Prescriptions on File Prior to Visit  Medication Sig Dispense Refill  . aspirin 81 MG EC tablet Take 1 tablet (81 mg total) by mouth daily. Swallow whole. 30 tablet 12  . benazepril-hydrochlorthiazide (LOTENSIN HCT) 20-25 MG per tablet Take 1 tablet by mouth  every day 90 tablet 0  . butalbital-acetaminophen-caffeine (FIORICET WITH CODEINE) 50-325-40-30 MG per capsule Take one to two capsules by mouth once daily as needed for pain. 30 capsule 0  . cholecalciferol (VITAMIN D) 1000 UNITS tablet Take 1,000 Units by mouth daily.    . citalopram (CELEXA) 40 MG tablet Take 1 tablet  by mouth  every night at bedtime 90 tablet 0  . diazepam (VALIUM) 5 MG tablet TAKE ONE-HALF TO ONE TABLET BY MOUTH EVERY 12 HOURS AS NEEDED FOR ANXIETY 30 tablet 0  . esomeprazole (NEXIUM) 40 MG capsule Take 40 mg by mouth daily as needed.     . Nutritional Supplements (ESTROVEN PO) Take 1 capsule by mouth daily.    . pravastatin (PRAVACHOL) 40 MG tablet Take 1 tablet by mouth  daily 90 tablet 1   No current facility-administered medications on file prior to visit.   The PMH, PSH, Social History, Family History, Medications, and allergies have been reviewed in [facility], and have been updated if relevant.   Review of Systems  Constitutional: Positive for activity change and appetite change.  Psychiatric/Behavioral: Positive for suicidal ideas, dysphoric mood and decreased concentration. Negative for hallucinations, confusion, sleep disturbance and self-injury. The patient is nervous/anxious. The patient is not hyperactive.          Objective:   Physical Exam  Constitutional: She appears well-developed and well-nourished. No distress.  tearful  Psychiatric:  Tearful, good eye contact and conversation is appropriate   BP 144/82 mmHg  Pulse 73  Temp(Src) 97.9 F (36.6 C) (Oral)  Wt 204 lb 8 oz (92.761 kg)  SpO2 97%      Assessment & Plan:

## 2015-02-27 NOTE — Patient Instructions (Signed)
We are starting zoloft 25 mg daily. Start weaning off celexa- 1 tablet every other day for 1 weekn, then 1/2 tablet every other day for week.  We are referring you to a therapist.  Please stop by to see Rosaria Ferries on your way out.  Please cal me in e afew weeks.

## 2015-03-21 ENCOUNTER — Other Ambulatory Visit: Payer: Self-pay

## 2015-03-21 ENCOUNTER — Other Ambulatory Visit: Payer: Self-pay | Admitting: Family Medicine

## 2015-03-21 MED ORDER — BUTALBITAL-APAP-CAFF-COD 50-325-40-30 MG PO CAPS: ORAL_CAPSULE | ORAL | Status: DC

## 2015-03-21 NOTE — Telephone Encounter (Signed)
Pt left v/m requesting refill on fioricet with codeine to [facility] [address]. The h/a is really bad today. Pt request cb when refilled.

## 2015-03-22 NOTE — Telephone Encounter (Signed)
Rx called in to requested pharmacy 

## 2015-03-27 ENCOUNTER — Ambulatory Visit: Payer: 59 | Admitting: Psychology

## 2015-03-27 ENCOUNTER — Ambulatory Visit (INDEPENDENT_AMBULATORY_CARE_PROVIDER_SITE_OTHER): Payer: 59 | Admitting: Family Medicine

## 2015-03-27 ENCOUNTER — Encounter: Payer: Self-pay | Admitting: Family Medicine

## 2015-03-27 VITALS — BP 122/72 | HR 85 | Temp 98.2°F | Wt 200.8 lb

## 2015-03-27 DIAGNOSIS — D229 Melanocytic nevi, unspecified: Secondary | ICD-10-CM | POA: Insufficient documentation

## 2015-03-27 DIAGNOSIS — D239 Other benign neoplasm of skin, unspecified: Secondary | ICD-10-CM | POA: Diagnosis not present

## 2015-03-27 NOTE — Assessment & Plan Note (Signed)
New- now with changing color and borders. Refer to dermatology for removal/biopsy. The patient indicates understanding of these issues and agrees with the plan.

## 2015-03-27 NOTE — Progress Notes (Signed)
Subjective:   Patient ID: Diane [name], female    DOB: 09-Jul-1951, 64 y.o.   MRN: [id number]  Diane [name] is a pleasant 64 y.o. year old female who presents to clinic today with Nevus  on 03/27/2015  HPI:  Has had a nevus on inner left thigh for years ("as long as I can remember") but noticed a couple of days ago that it looked different.  Now has a hyperpigmented, irregular area in the center. It is not itchy or painful. Not bleeding.  Current Outpatient Prescriptions on File Prior to Visit  Medication Sig Dispense Refill  . aspirin 81 MG EC tablet Take 1 tablet (81 mg total) by mouth daily. Swallow whole. 30 tablet 12  . benazepril-hydrochlorthiazide (LOTENSIN HCT) 20-25 MG per tablet Take 1 tablet by mouth  every day 90 tablet 0  . butalbital-acetaminophen-caffeine (FIORICET WITH CODEINE) 50-325-40-30 MG per capsule Take one to two capsules by mouth once daily as needed for pain. 30 capsule 0  . cholecalciferol (VITAMIN D) 1000 UNITS tablet Take 1,000 Units by mouth daily.    . diazepam (VALIUM) 5 MG tablet TAKE ONE-HALF TO ONE TABLET BY MOUTH EVERY 12 HOURS AS NEEDED FOR ANXIETY 60 tablet 0  . esomeprazole (NEXIUM) 40 MG capsule Take 40 mg by mouth daily as needed.     . Nutritional Supplements (ESTROVEN PO) Take 1 capsule by mouth daily.    . pravastatin (PRAVACHOL) 40 MG tablet Take 1 tablet by mouth  daily 90 tablet 1  . sertraline (ZOLOFT) 25 MG tablet Take 1 tablet (25 mg total) by mouth daily. 30 tablet 1   No current facility-administered medications on file prior to visit.    No Known Allergies  Past Medical History  Diagnosis Date  . 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 Hypertension   . GERD (gastroesophageal reflux disease)   . Hyperlipidemia   . Ejection fraction   . CAD (coronary artery disease)   . Arthritis   . PONV (postoperative nausea and vomiting)   . 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 Cesarean section    . Left heart catheterization with coronary angiogram N/A 10/28/2013    Procedure: LEFT HEART CATHETERIZATION WITH CORONARY ANGIOGRAM;  Surgeon: Larey Dresser, MD;  Location: [facility] CATH LAB;  Service: Cardiovascular;  Laterality: N/A;  . Tubal ligation    . Cholecystectomy  1990  . Knee arthroscopy with medial menisectomy Left 01/05/2015    Procedure: LEFT KNEE ARTHROSCOPY WITH MEDIAL AND LATERAL MENISECTOMY; DEBRIDEMENT LATERAL PATELLA FEMORAL ;  Surgeon: Yvette Rack., MD;  Location: [facility];  Service: Orthopedics;  Laterality: Left;  . Chondroplasty Left 01/05/2015    Procedure: CHONDROPLASTY;  Surgeon: Yvette Rack., MD;  Location: [facility];  Service: Orthopedics;  Laterality: Left;    Family History  Problem Relation Age of Onset  . Stroke Father   . Heart failure Father     History   Social History  . Marital Status: Divorced    Spouse Name: [name]  . Number of Children: 8  . Years of Education: N/A   Occupational History  . Works full time at [employer] as [place]  . Smoking status: Never Smoker   . Smokeless tobacco: Never Used  Comment: LIVES WITH 2 SMOKERS   . Alcohol Use: Yes     Comment: Rare social ETOH  . Drug Use: No  . Sexual Activity: Not on file   Other Topics Concern  . Not on file   Social History Narrative   Divorced, but has fiancee.  Lives with fiancee in [place].  Moved here recently from [place], [place].     She 8 children- ages 52- 75.               The PMH, PSH, Social History, Family History, Medications, and allergies have been reviewed in [facility], and have been updated if relevant.   Review of Systems  Skin: Negative for pallor and rash.  All other systems reviewed and are negative.      Objective:    BP 122/72 mmHg  Pulse 85  Temp(Src) 98.2 F (36.8 C) (Oral)  Wt 200 lb  12 oz (91.06 kg)  SpO2 97%   Physical Exam  Constitutional: She is oriented to person, place, and time. She appears well-developed and well-nourished. No distress.  Eyes: Conjunctivae are normal.  Cardiovascular: Normal rate.   Neurological: She is alert and oriented to person, place, and time. No cranial nerve deficit.  Skin:     Psychiatric: She has a normal mood and affect. Her behavior is normal. Judgment and thought content normal.  Nursing note and vitals reviewed.         Assessment & Plan:   Atypical nevus - Plan: Ambulatory referral to Dermatology No Follow-up on file.

## 2015-03-27 NOTE — Progress Notes (Signed)
Pre visit review using our clinic review tool, if applicable. No additional management support is needed unless otherwise documented below in the visit note. 

## 2015-03-27 NOTE — Patient Instructions (Signed)
Great to see you. Let me know how your session goes.  We will call you with your dermatology referral.

## 2015-04-11 ENCOUNTER — Ambulatory Visit (INDEPENDENT_AMBULATORY_CARE_PROVIDER_SITE_OTHER): Payer: 59 | Admitting: Psychology

## 2015-04-11 DIAGNOSIS — F4323 Adjustment disorder with mixed anxiety and depressed mood: Secondary | ICD-10-CM

## 2015-04-12 ENCOUNTER — Other Ambulatory Visit: Payer: Self-pay | Admitting: Family Medicine

## 2015-04-13 ENCOUNTER — Other Ambulatory Visit: Payer: Self-pay | Admitting: Family Medicine

## 2015-04-17 ENCOUNTER — Other Ambulatory Visit: Payer: Self-pay | Admitting: Family Medicine

## 2015-04-17 NOTE — Telephone Encounter (Signed)
Pt said got early morning notice that diazepam had not been refilled yet; advised pt was called in today at 10:30. Pt will ck with pharmacy.

## 2015-04-17 NOTE — Telephone Encounter (Signed)
Last f/u appt 02/2015 

## 2015-04-17 NOTE — Telephone Encounter (Signed)
Rx called in to requested pharmacy 

## 2015-04-18 ENCOUNTER — Other Ambulatory Visit: Payer: Self-pay | Admitting: *Deleted

## 2015-04-18 MED ORDER — SERTRALINE HCL 25 MG PO TABS: 25.0000 mg | ORAL_TABLET | Freq: Every day | ORAL | Status: DC

## 2015-04-20 ENCOUNTER — Other Ambulatory Visit: Payer: Self-pay | Admitting: Family Medicine

## 2015-04-20 NOTE — Telephone Encounter (Signed)
Last f/u appt 06/2014-CPE 

## 2015-04-20 NOTE — Telephone Encounter (Signed)
Rx called in to requested pharmacy 

## 2015-04-20 NOTE — Telephone Encounter (Signed)
Pt request refill fioricet with codeine to [facility] [address].Please advise.

## 2015-04-20 NOTE — Telephone Encounter (Signed)
Pt left v/m requesting status of refill; left detailed message on cell [phone] per DPR that rx was called in and Megan at pharmacy said would be ready in 1 1/2 hrs.

## 2015-04-25 ENCOUNTER — Ambulatory Visit: Payer: 59 | Admitting: Psychology

## 2015-04-30 ENCOUNTER — Other Ambulatory Visit: Payer: Self-pay | Admitting: Family Medicine

## 2015-05-01 ENCOUNTER — Telehealth: Payer: Self-pay | Admitting: *Deleted

## 2015-05-01 NOTE — Telephone Encounter (Signed)
Fax received from [facility] indicating pt is receiving controlled substances from multiple providers; [name], Cleda Mccreedy, and Chriss Czar. Total quantities include #340 oxycodone, #160 hydrocodone, #30 tramadol and #90 fioricet between the dates of 01/05/15-03/29/2015

## 2015-05-03 ENCOUNTER — Other Ambulatory Visit: Payer: Self-pay | Admitting: Orthopedic Surgery

## 2015-05-03 DIAGNOSIS — M7989 Other specified soft tissue disorders: Secondary | ICD-10-CM

## 2015-05-04 ENCOUNTER — Ambulatory Visit (HOSPITAL_COMMUNITY): Payer: 59 | Attending: Cardiology

## 2015-05-04 DIAGNOSIS — E785 Hyperlipidemia, unspecified: Secondary | ICD-10-CM | POA: Insufficient documentation

## 2015-05-04 DIAGNOSIS — E669 Obesity, unspecified: Secondary | ICD-10-CM | POA: Insufficient documentation

## 2015-05-04 DIAGNOSIS — I10 Essential (primary) hypertension: Secondary | ICD-10-CM | POA: Insufficient documentation

## 2015-05-04 DIAGNOSIS — I2510 Atherosclerotic heart disease of native coronary artery without angina pectoris: Secondary | ICD-10-CM | POA: Insufficient documentation

## 2015-05-04 DIAGNOSIS — M7989 Other specified soft tissue disorders: Secondary | ICD-10-CM | POA: Diagnosis not present

## 2015-05-04 DIAGNOSIS — Z9889 Other specified postprocedural states: Secondary | ICD-10-CM | POA: Insufficient documentation

## 2015-05-08 ENCOUNTER — Ambulatory Visit (INDEPENDENT_AMBULATORY_CARE_PROVIDER_SITE_OTHER): Payer: 59 | Admitting: Psychology

## 2015-05-08 DIAGNOSIS — F4323 Adjustment disorder with mixed anxiety and depressed mood: Secondary | ICD-10-CM

## 2015-05-15 ENCOUNTER — Telehealth: Payer: Self-pay

## 2015-05-15 NOTE — Telephone Encounter (Signed)
Pt calling to ck on sertraline refill; spoke with [facility] [address] and found 04/18/15 rx in prep file and will get ready for pt to pick up. Pt notified and voiced understanding.

## 2015-05-16 ENCOUNTER — Other Ambulatory Visit: Payer: Self-pay

## 2015-05-16 MED ORDER — BUTALBITAL-APAP-CAFF-COD 50-325-40-30 MG PO CAPS: ORAL_CAPSULE | ORAL | Status: DC

## 2015-05-16 NOTE — Telephone Encounter (Signed)
Rx called in to requested pharmacy 

## 2015-05-16 NOTE — Telephone Encounter (Signed)
Pt left v/m requesting refill fioricet with codeine to [facility] [address]. Pt has death in family and pt has migraine; last refilled 04/20/15 and last seen for annual exam on 07/24/14.Please advise.

## 2015-05-22 ENCOUNTER — Other Ambulatory Visit: Payer: Self-pay | Admitting: Family Medicine

## 2015-05-22 NOTE — Telephone Encounter (Signed)
Last f/u appt 02/2015 

## 2015-05-23 ENCOUNTER — Other Ambulatory Visit: Payer: Self-pay | Admitting: Family Medicine

## 2015-05-23 ENCOUNTER — Ambulatory Visit (INDEPENDENT_AMBULATORY_CARE_PROVIDER_SITE_OTHER): Payer: 59 | Admitting: Psychology

## 2015-05-23 DIAGNOSIS — F4323 Adjustment disorder with mixed anxiety and depressed mood: Secondary | ICD-10-CM

## 2015-05-23 NOTE — Telephone Encounter (Signed)
Rx called in to requested pharmacy 

## 2015-05-30 DIAGNOSIS — C44300 Unspecified malignant neoplasm of skin of unspecified part of face: Secondary | ICD-10-CM

## 2015-05-30 HISTORY — DX: Unspecified malignant neoplasm of skin of unspecified part of face: C44.300

## 2015-06-11 ENCOUNTER — Telehealth: Payer: Self-pay | Admitting: Cardiology

## 2015-06-11 NOTE — Telephone Encounter (Signed)
New message      Want the nurse to know Dr French Ana will send over a surgical clearance to be completed

## 2015-06-11 NOTE — Telephone Encounter (Signed)
**Note De-Identified Diane [name] Obfuscation** We received cardiac surgical clearance paperwork from Dr French Ana at [facility]. The pt is scheduled to have left [facility] on 07/06/15. At the pts last OV with Dr Ron Butrum on 11/07/13 she was advised to f/u as needed.  Per Dr Ron Grieder request I called the pt and asked her if she has had any CP, SOB, dizziness, weakness or fatigue. She denies CP, SOB, dizziness, weakness and fatigue and states that she remains on the same cardiac medications she was taking at her last OV with Dr Ron Salay and that her BP is always normal.   Dr Ron Tamargo completed her surgical clearance form and it has been given to medical records to fax back to Dr Alvester Morin office.

## 2015-06-14 ENCOUNTER — Ambulatory Visit: Payer: Self-pay | Admitting: Physician Assistant

## 2015-06-14 NOTE — H&P (Signed)
TOTAL KNEE ADMISSION H&P  Patient is being admitted for left total knee arthroplasty.  Subjective:  Chief Complaint:left knee pain.  HPI: Diane [name], 64 y.o. female, has a history of pain and functional disability in the left knee due to arthritis and has failed non-surgical conservative treatments for greater than 12 weeks to includeNSAID's and/or analgesics, corticosteriod injections, viscosupplementation injections, flexibility and strengthening excercises, use of assistive devices, weight reduction as appropriate and activity modification.  Onset of symptoms was gradual, starting 2 years ago with gradually worsening course since that time. The patient noted prior procedures on the knee to include  arthroscopy and menisectomy on the left knee(s).  Patient currently rates pain in the left knee(s) at 8 out of 10 with activity. Patient has night pain, worsening of pain with activity and weight bearing, pain that interferes with activities of daily living, pain with passive range of motion, crepitus and joint swelling.  Patient has evidence of periarticular osteophytes and joint space narrowing by imaging studies.There is no active infection.  Patient Active Problem List   Diagnosis Date Noted  . Atypical nevus 03/27/2015  . Carotid bruit 11/04/2013  . Hyperlipidemia   . Ejection fraction   . CAD (coronary artery disease)   . History of CVA (cerebrovascular accident) 10/28/2013  . Dyslipidemia 10/28/2013  . Dyspnea on exertion 10/28/2013  . Generalized anxiety disorder 04/11/2013  . Routine general medical examination at a health care facility 04/11/2013  . GERD (gastroesophageal reflux disease) 02/02/2013  . Hypertension 02/02/2013   Past Medical History  Diagnosis Date  . 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 Hypertension   . GERD (gastroesophageal reflux disease)   . Hyperlipidemia   . Ejection fraction   . CAD (coronary artery  disease)   . Arthritis   . PONV (postoperative nausea and vomiting)   . 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 Cesarean section    . Left heart catheterization with coronary angiogram N/A 10/28/2013    Procedure: LEFT HEART CATHETERIZATION WITH CORONARY ANGIOGRAM;  Surgeon: Larey Dresser, MD;  Location: [facility] CATH LAB;  Service: Cardiovascular;  Laterality: N/A;  . Tubal ligation    . Cholecystectomy  1990  . Knee arthroscopy with medial menisectomy Left 01/05/2015    Procedure: LEFT KNEE ARTHROSCOPY WITH MEDIAL AND LATERAL MENISECTOMY; DEBRIDEMENT LATERAL PATELLA FEMORAL ;  Surgeon: Yvette Rack., MD;  Location: [place];  Service: Orthopedics;  Laterality: Left;  . Chondroplasty Left 01/05/2015    Procedure: CHONDROPLASTY;  Surgeon: Yvette Rack., MD;  Location: [place];  Service: Orthopedics;  Laterality: Left;     (Not in a hospital admission) No Known Allergies  History  Substance Use Topics  . Smoking status: Never Smoker   . Smokeless tobacco: Never Used     Comment: LIVES WITH 2 SMOKERS   . Alcohol Use: Yes     Comment: Rare social ETOH    Family History  Problem Relation Age of Onset  . Stroke Father   . Heart failure Father      Review of Systems  Constitutional: Positive for weight loss. Negative for fever, chills, malaise/fatigue and diaphoresis.  HENT: Positive for hearing loss. Negative for congestion, ear discharge, ear pain, nosebleeds, sore throat and tinnitus.   Eyes: Negative.   Respiratory: Negative.  Negative for  stridor.   Cardiovascular: Positive for leg swelling. Negative for chest pain, palpitations, orthopnea, claudication and PND.  Gastrointestinal: Positive for nausea, vomiting, diarrhea and constipation. Negative for heartburn, abdominal pain, blood in stool and melena.  Genitourinary: Positive for dysuria, urgency and frequency. Negative for hematuria and flank  pain.  Musculoskeletal: Positive for joint pain. Negative for falls.  Skin: Negative.   Neurological: Positive for dizziness and headaches. Negative for tingling, tremors, sensory change, speech change, focal weakness, seizures, loss of consciousness and weakness.  Endo/Heme/Allergies: Negative.   Psychiatric/Behavioral: Negative for depression. The patient is nervous/anxious.     Objective:  Physical Exam  Constitutional: She is oriented to person, place, and time. She appears well-developed and well-nourished. No distress.  HENT:  Head: Normocephalic and atraumatic.  Nose: Nose normal.  Eyes: Conjunctivae and EOM are normal. Pupils are equal, round, and reactive to light.  Neck: Normal range of motion. Neck supple.  Cardiovascular: Normal rate, regular rhythm, normal heart sounds and intact distal pulses.   Respiratory: Effort normal and breath sounds normal. No respiratory distress. She has no wheezes. She has no rales. She exhibits no tenderness.  GI: Bowel sounds are normal. She exhibits no distension. There is no tenderness.  Musculoskeletal:       Left knee: She exhibits swelling. She exhibits no effusion, no laceration, no erythema, no LCL laxity and no MCL laxity. Tenderness found.  Lymphadenopathy:    She has no cervical adenopathy.  Neurological: She is alert and oriented to person, place, and time.  Skin: Skin is warm and dry. No rash noted. No erythema.  Psychiatric: She has a normal mood and affect. Her behavior is normal.    Vital signs in last 24 hours: @VSRANGES @  Labs:   Estimated body mass index is 36.11 kg/(m^2) as calculated from the following:   Height as of 01/05/15: 5' 2.5" (1.588 m).   Weight as of 03/27/15: 91.06 kg (200 lb 12 oz).   Imaging Review Plain radiographs demonstrate moderate degenerative joint disease of the left knee(s). The overall alignment isneutral. The bone quality appears to be good for age and reported activity  level.  Assessment/Plan:  End stage arthritis, left knee   The patient history, physical examination, clinical judgment of the provider and imaging studies are consistent with end stage degenerative joint disease of the left knee(s) and total knee arthroplasty is deemed medically necessary. The treatment options including medical management, injection therapy arthroscopy and arthroplasty were discussed at length. The risks and benefits of total knee arthroplasty were presented and reviewed. The risks due to aseptic loosening, infection, stiffness, patella tracking problems, thromboembolic complications and other imponderables were discussed. The patient acknowledged the explanation, agreed to proceed with the plan and consent was signed. Patient is being admitted for inpatient treatment for surgery, pain control, PT, OT, prophylactic antibiotics, VTE prophylaxis, progressive ambulation and ADL's and discharge planning. The patient is planning to be discharged home with home health services

## 2015-06-18 ENCOUNTER — Other Ambulatory Visit: Payer: Self-pay | Admitting: Family Medicine

## 2015-06-18 NOTE — Telephone Encounter (Signed)
Last f/u appt 06/2014-CPE 

## 2015-06-18 NOTE — Telephone Encounter (Signed)
Pt left v/m requesting status of refill fioricet with codeine to [facility] [address] [place]; pt had basal cell CA  Removed from face and has migraine.pt request refill done today.

## 2015-06-19 ENCOUNTER — Ambulatory Visit: Payer: 59 | Admitting: Psychology

## 2015-06-19 NOTE — Telephone Encounter (Signed)
Patient notified by telephone that script has been called to the pharmacy.

## 2015-06-19 NOTE — Telephone Encounter (Signed)
Rx called to pharmacy as instructed. 

## 2015-06-25 ENCOUNTER — Telehealth: Payer: Self-pay | Admitting: Family Medicine

## 2015-06-25 ENCOUNTER — Encounter (HOSPITAL_COMMUNITY)
Admission: RE | Admit: 2015-06-25 | Discharge: 2015-06-25 | Disposition: A | Discharge status: Home / Self Care | Payer: 59 | Source: Ambulatory Visit | Attending: Orthopedic Surgery | Admitting: Orthopedic Surgery

## 2015-06-25 ENCOUNTER — Ambulatory Visit (HOSPITAL_COMMUNITY)
Admission: RE | Admit: 2015-06-25 | Discharge: 2015-06-25 | Disposition: A | Discharge status: Home / Self Care | Payer: 59 | Source: Ambulatory Visit | Attending: Physician Assistant | Admitting: Physician Assistant

## 2015-06-25 ENCOUNTER — Encounter (HOSPITAL_COMMUNITY): Payer: Self-pay

## 2015-06-25 DIAGNOSIS — Z0183 Encounter for blood typing: Secondary | ICD-10-CM | POA: Diagnosis not present

## 2015-06-25 DIAGNOSIS — M179 Osteoarthritis of knee, unspecified: Secondary | ICD-10-CM | POA: Insufficient documentation

## 2015-06-25 DIAGNOSIS — M1712 Unilateral primary osteoarthritis, left knee: Secondary | ICD-10-CM

## 2015-06-25 DIAGNOSIS — Z01812 Encounter for preprocedural laboratory examination: Secondary | ICD-10-CM | POA: Insufficient documentation

## 2015-06-25 DIAGNOSIS — Z01811 Encounter for preprocedural respiratory examination: Secondary | ICD-10-CM | POA: Insufficient documentation

## 2015-06-25 HISTORY — DX: Depression, unspecified: F32.A

## 2015-06-25 HISTORY — DX: Unspecified malignant neoplasm of skin of unspecified part of face: C44.300

## 2015-06-25 HISTORY — DX: Major depressive disorder, single episode, unspecified: F32.9

## 2015-06-25 LAB — CBC WITH DIFFERENTIAL/PLATELET
Basophils Absolute: 0 10*3/uL (ref 0.0–0.1)
Basophils Relative: 0 % (ref 0–1)
EOS ABS: 0.1 10*3/uL (ref 0.0–0.7)
EOS PCT: 2 % (ref 0–5)
HEMATOCRIT: 44.3 % (ref 36.0–46.0)
Hemoglobin: 15.6 g/dL — ABNORMAL HIGH (ref 12.0–15.0)
LYMPHS ABS: 1.5 10*3/uL (ref 0.7–4.0)
LYMPHS PCT: 24 % (ref 12–46)
MCH: 33.4 pg (ref 26.0–34.0)
MCHC: 35.2 g/dL (ref 30.0–36.0)
MCV: 94.9 fL (ref 78.0–100.0)
Monocytes Absolute: 0.5 10*3/uL (ref 0.1–1.0)
Monocytes Relative: 8 % (ref 3–12)
Neutro Abs: 4.1 10*3/uL (ref 1.7–7.7)
Neutrophils Relative %: 66 % (ref 43–77)
PLATELETS: 210 10*3/uL (ref 150–400)
RBC: 4.67 MIL/uL (ref 3.87–5.11)
RDW: 12.5 % (ref 11.5–15.5)
WBC: 6.3 10*3/uL (ref 4.0–10.5)

## 2015-06-25 LAB — URINALYSIS, ROUTINE W REFLEX MICROSCOPIC
Bilirubin Urine: NEGATIVE
Glucose, UA: NEGATIVE mg/dL
Hgb urine dipstick: NEGATIVE
Ketones, ur: NEGATIVE mg/dL
Nitrite: NEGATIVE
PROTEIN: NEGATIVE mg/dL
Specific Gravity, Urine: 1.015 (ref 1.005–1.030)
UROBILINOGEN UA: 0.2 mg/dL (ref 0.0–1.0)
pH: 7.5 (ref 5.0–8.0)

## 2015-06-25 LAB — COMPREHENSIVE METABOLIC PANEL
ALK PHOS: 70 U/L (ref 38–126)
ALT: 26 U/L (ref 14–54)
AST: 25 U/L (ref 15–41)
Albumin: 4.8 g/dL (ref 3.5–5.0)
Anion gap: 12 (ref 5–15)
BILIRUBIN TOTAL: 0.5 mg/dL (ref 0.3–1.2)
BUN: 13 mg/dL (ref 6–20)
CHLORIDE: 94 mmol/L — AB (ref 101–111)
CO2: 31 mmol/L (ref 22–32)
Calcium: 10.2 mg/dL (ref 8.9–10.3)
Creatinine, Ser: 0.67 mg/dL (ref 0.44–1.00)
GLUCOSE: 106 mg/dL — AB (ref 65–99)
POTASSIUM: 3.2 mmol/L — AB (ref 3.5–5.1)
Sodium: 137 mmol/L (ref 135–145)
Total Protein: 7.8 g/dL (ref 6.5–8.1)

## 2015-06-25 LAB — APTT: aPTT: 27 seconds (ref 24–37)

## 2015-06-25 LAB — URINE MICROSCOPIC-ADD ON

## 2015-06-25 LAB — PROTIME-INR
INR: 0.96 (ref 0.00–1.49)
Prothrombin Time: 13 seconds (ref 11.6–15.2)

## 2015-06-25 LAB — SURGICAL PCR SCREEN
MRSA, PCR: NEGATIVE
Staphylococcus aureus: POSITIVE — AB

## 2015-06-25 LAB — TYPE AND SCREEN
ABO/RH(D): O POS
Antibody Screen: NEGATIVE

## 2015-06-25 LAB — ABO/RH: ABO/RH(D): O POS

## 2015-06-25 NOTE — Pre-Procedure Instructions (Signed)
    LATORIE MONTESANO  06/25/2015      Your procedure is scheduled on : Friday, July 8.  Report to [facility] Admitting at 8 :00 AM   Call this number if you have problems the morning of surgery: [phone]   Remember:  Do not eat food or drink liquids after midnight. Thursday, July 7.               Take these medicines the morning of surgery with A SIP OF WATER :  sertraline (ZOLOFT).               Take if needed: Pain Medication, esomeprazole (NEXIUM), diazepam (VALIUM).               Friday, July 1 STOP taking Aspirin, Vitamins, Herbal Medications; do not take any NSAID'S --Aleve (Naproxen), Advil (ibuproden)     Do not wear jewelry, make-up or nail polish.  Do not wear lotions, powders, or perfumes.    Do not shave 48 hours prior to surgery.    Do not bring valuables to the hospital.  [facility] is not responsible for any belongings or valuables.  Contacts, dentures or bridgework may not be worn into surgery.  Leave your suitcase in the car.  After surgery it may be brought to your room.  For patients admitted to the hospital, discharge time will be determined by your treatment team.   Special instructions:  Review  [place] - Preparing For Surgery.  Please read over the following fact sheets that you were given. Pain Booklet, Coughing and Deep Breathing, Blood Transfusion Information and Surgical Site Infection Prevention and Incentive Spirometry.

## 2015-06-25 NOTE — Progress Notes (Signed)
   06/25/15 1143  OBSTRUCTIVE SLEEP APNEA  Have you ever been diagnosed with sleep apnea through a sleep study? No  Do you snore loudly (loud enough to be heard through closed doors)?  0  Do you often feel tired, fatigued, or sleepy during the daytime? 0  Has anyone observed you stop breathing during your sleep? 0  Do you have, or are you being treated for high blood pressure? 1  BMI more than 35 kg/m2? 1  Age over 63 years old? 1  Neck circumference greater than 40 cm/16 inches? 0  Gender: 0

## 2015-06-26 LAB — URINE CULTURE: CULTURE: NO GROWTH

## 2015-06-26 NOTE — Telephone Encounter (Signed)
Pt left v/m; [facility] will get double fax from local pharmacy and mail order; pts ins now requires pt to use [facility]. And pt request small amt (# 10 ) to [facility] until get mail order med delivered.

## 2015-06-26 NOTE — Telephone Encounter (Signed)
Rx called in to requested pharmacy 

## 2015-06-26 NOTE — Telephone Encounter (Signed)
Last f/u appt 02/2015 

## 2015-07-05 MED ORDER — TRANEXAMIC ACID 1000 MG/10ML IV SOLN
2000.0000 mg | INTRAVENOUS | Status: DC
  Filled 2015-07-05: qty 20

## 2015-07-05 NOTE — Progress Notes (Signed)
Message left for pt to arrive at 830 tomorrow, asked for her to call us back to let us know she got the message.

## 2015-07-05 NOTE — Progress Notes (Signed)
Pt called back, verbalized understanding to arrive at 0830 in the morning.

## 2015-07-06 ENCOUNTER — Encounter (HOSPITAL_COMMUNITY)
Admission: RE | Discharge status: Against Medical Advice | Payer: Self-pay | Source: Ambulatory Visit | Attending: Orthopedic Surgery

## 2015-07-06 ENCOUNTER — Inpatient Hospital Stay (HOSPITAL_COMMUNITY): Payer: 59 | Admitting: Certified Registered Nurse Anesthetist

## 2015-07-06 ENCOUNTER — Encounter (HOSPITAL_COMMUNITY): Payer: Self-pay | Admitting: Anesthesiology

## 2015-07-06 ENCOUNTER — Inpatient Hospital Stay (HOSPITAL_COMMUNITY)
Admission: RE | Admit: 2015-07-06 | Discharge: 2015-07-08 | DRG: 470 | Discharge status: Against Medical Advice | Payer: 59 | Source: Ambulatory Visit | Attending: Orthopedic Surgery | Admitting: Orthopedic Surgery

## 2015-07-06 DIAGNOSIS — E785 Hyperlipidemia, unspecified: Secondary | ICD-10-CM | POA: Diagnosis present

## 2015-07-06 DIAGNOSIS — Z85828 Personal history of other malignant neoplasm of skin: Secondary | ICD-10-CM | POA: Diagnosis not present

## 2015-07-06 DIAGNOSIS — I69398 Other sequelae of cerebral infarction: Secondary | ICD-10-CM

## 2015-07-06 DIAGNOSIS — I69351 Hemiplegia and hemiparesis following cerebral infarction affecting right dominant side: Secondary | ICD-10-CM | POA: Diagnosis not present

## 2015-07-06 DIAGNOSIS — F329 Major depressive disorder, single episode, unspecified: Secondary | ICD-10-CM | POA: Diagnosis present

## 2015-07-06 DIAGNOSIS — K219 Gastro-esophageal reflux disease without esophagitis: Secondary | ICD-10-CM | POA: Diagnosis present

## 2015-07-06 DIAGNOSIS — M1712 Unilateral primary osteoarthritis, left knee: Secondary | ICD-10-CM | POA: Diagnosis present

## 2015-07-06 DIAGNOSIS — I2510 Atherosclerotic heart disease of native coronary artery without angina pectoris: Secondary | ICD-10-CM | POA: Diagnosis present

## 2015-07-06 DIAGNOSIS — H918X1 Other specified hearing loss, right ear: Secondary | ICD-10-CM | POA: Diagnosis present

## 2015-07-06 DIAGNOSIS — I10 Essential (primary) hypertension: Secondary | ICD-10-CM | POA: Diagnosis present

## 2015-07-06 HISTORY — PX: TOTAL KNEE ARTHROPLASTY: SHX125

## 2015-07-06 SURGERY — ARTHROPLASTY, KNEE, TOTAL
Anesthesia: General | Site: Knee | Laterality: Left

## 2015-07-06 MED ORDER — HYDROMORPHONE HCL 1 MG/ML IJ SOLN
INTRAMUSCULAR | Status: AC
  Administered 2015-07-06: 0.5 mg via INTRAVENOUS
  Filled 2015-07-06: qty 1

## 2015-07-06 MED ORDER — HYDROMORPHONE HCL 1 MG/ML IJ SOLN
0.5000 mg | INTRAMUSCULAR | Status: DC | PRN
  Administered 2015-07-06 (×3): 0.5 mg via INTRAVENOUS

## 2015-07-06 MED ORDER — PRAVASTATIN SODIUM 40 MG PO TABS
40.0000 mg | ORAL_TABLET | Freq: Every day | ORAL | Status: DC
  Administered 2015-07-06 – 2015-07-08 (×3): 40 mg via ORAL
  Filled 2015-07-06 (×3): qty 1

## 2015-07-06 MED ORDER — BISACODYL 5 MG PO TBEC: 5.0000 mg | DELAYED_RELEASE_TABLET | Freq: Every day | ORAL | Status: DC | PRN

## 2015-07-06 MED ORDER — CHLORHEXIDINE GLUCONATE 4 % EX LIQD: 60.0000 mL | Freq: Once | CUTANEOUS | Status: DC

## 2015-07-06 MED ORDER — FENTANYL CITRATE (PF) 250 MCG/5ML IJ SOLN
INTRAMUSCULAR | Status: AC
  Filled 2015-07-06: qty 5

## 2015-07-06 MED ORDER — METOCLOPRAMIDE HCL 5 MG/ML IJ SOLN
5.0000 mg | Freq: Three times a day (TID) | INTRAMUSCULAR | Status: DC | PRN
  Administered 2015-07-07: 10 mg via INTRAVENOUS
  Filled 2015-07-06: qty 2

## 2015-07-06 MED ORDER — APIXABAN 2.5 MG PO TABS: 2.5000 mg | ORAL_TABLET | Freq: Two times a day (BID) | ORAL | Status: DC

## 2015-07-06 MED ORDER — PANTOPRAZOLE SODIUM 40 MG PO TBEC
80.0000 mg | DELAYED_RELEASE_TABLET | Freq: Every day | ORAL | Status: DC
  Administered 2015-07-07 – 2015-07-08 (×2): 80 mg via ORAL
  Filled 2015-07-06 (×2): qty 2

## 2015-07-06 MED ORDER — APIXABAN 2.5 MG PO TABS
2.5000 mg | ORAL_TABLET | Freq: Two times a day (BID) | ORAL | Status: DC
  Administered 2015-07-07 – 2015-07-08 (×3): 2.5 mg via ORAL
  Filled 2015-07-06 (×3): qty 1

## 2015-07-06 MED ORDER — CEFAZOLIN SODIUM-DEXTROSE 2-3 GM-% IV SOLR
INTRAVENOUS | Status: AC
  Filled 2015-07-06: qty 50

## 2015-07-06 MED ORDER — ACETAMINOPHEN 325 MG PO TABS: 650.0000 mg | ORAL_TABLET | Freq: Four times a day (QID) | ORAL | Status: DC | PRN

## 2015-07-06 MED ORDER — METOCLOPRAMIDE HCL 5 MG PO TABS: 5.0000 mg | ORAL_TABLET | Freq: Three times a day (TID) | ORAL | Status: DC | PRN

## 2015-07-06 MED ORDER — DEXAMETHASONE SODIUM PHOSPHATE 4 MG/ML IJ SOLN
INTRAMUSCULAR | Status: DC | PRN
  Administered 2015-07-06: 4 mg via INTRAVENOUS

## 2015-07-06 MED ORDER — ROCURONIUM BROMIDE 50 MG/5ML IV SOLN
INTRAVENOUS | Status: AC
  Filled 2015-07-06: qty 1

## 2015-07-06 MED ORDER — OXYCODONE HCL 5 MG PO TABS
ORAL_TABLET | ORAL | Status: AC
  Administered 2015-07-06: 10 mg via ORAL
  Filled 2015-07-06: qty 2

## 2015-07-06 MED ORDER — OXYCODONE-ACETAMINOPHEN 5-325 MG PO TABS: 1.0000 | ORAL_TABLET | ORAL | Status: DC | PRN

## 2015-07-06 MED ORDER — LACTATED RINGERS IV SOLN
INTRAVENOUS | Status: DC
  Administered 2015-07-06: 12:00:00 via INTRAVENOUS
  Administered 2015-07-06: 50 mL/h via INTRAVENOUS

## 2015-07-06 MED ORDER — ONDANSETRON HCL 4 MG/2ML IJ SOLN
4.0000 mg | Freq: Four times a day (QID) | INTRAMUSCULAR | Status: DC | PRN
  Administered 2015-07-07: 4 mg via INTRAVENOUS
  Filled 2015-07-06: qty 2

## 2015-07-06 MED ORDER — DIPHENHYDRAMINE HCL 12.5 MG/5ML PO ELIX: 12.5000 mg | ORAL_SOLUTION | ORAL | Status: DC | PRN

## 2015-07-06 MED ORDER — FENTANYL CITRATE (PF) 100 MCG/2ML IJ SOLN
INTRAMUSCULAR | Status: DC | PRN
  Administered 2015-07-06: 100 ug via INTRAVENOUS
  Administered 2015-07-06 (×3): 50 ug via INTRAVENOUS
  Administered 2015-07-06: 100 ug via INTRAVENOUS
  Administered 2015-07-06: 50 ug via INTRAVENOUS
  Administered 2015-07-06: 100 ug via INTRAVENOUS

## 2015-07-06 MED ORDER — CEFAZOLIN SODIUM-DEXTROSE 2-3 GM-% IV SOLR
2.0000 g | Freq: Four times a day (QID) | INTRAVENOUS | Status: AC
  Administered 2015-07-06 (×2): 2 g via INTRAVENOUS
  Filled 2015-07-06 (×2): qty 50

## 2015-07-06 MED ORDER — FENTANYL CITRATE (PF) 100 MCG/2ML IJ SOLN
INTRAMUSCULAR | Status: AC
  Administered 2015-07-06: 100 ug
  Filled 2015-07-06: qty 2

## 2015-07-06 MED ORDER — SODIUM CHLORIDE 0.9 % IR SOLN
Status: DC | PRN
  Administered 2015-07-06: 1000 mL

## 2015-07-06 MED ORDER — ONDANSETRON HCL 4 MG PO TABS
4.0000 mg | ORAL_TABLET | Freq: Four times a day (QID) | ORAL | Status: DC | PRN
  Administered 2015-07-08: 4 mg via ORAL
  Filled 2015-07-06: qty 1

## 2015-07-06 MED ORDER — LIDOCAINE HCL 4 % MT SOLN
OROMUCOSAL | Status: DC | PRN
  Administered 2015-07-06: 4 mL via TOPICAL

## 2015-07-06 MED ORDER — SODIUM CHLORIDE 0.9 % IV SOLN: INTRAVENOUS | Status: DC

## 2015-07-06 MED ORDER — HYDROMORPHONE HCL 1 MG/ML IJ SOLN
INTRAMUSCULAR | Status: AC
  Filled 2015-07-06: qty 1

## 2015-07-06 MED ORDER — ONDANSETRON HCL 4 MG/2ML IJ SOLN
INTRAMUSCULAR | Status: DC | PRN
  Administered 2015-07-06 (×2): 4 mg via INTRAVENOUS

## 2015-07-06 MED ORDER — ONDANSETRON HCL 4 MG/2ML IJ SOLN: 4.0000 mg | Freq: Once | INTRAMUSCULAR | Status: DC | PRN

## 2015-07-06 MED ORDER — DIAZEPAM 5 MG PO TABS
5.0000 mg | ORAL_TABLET | Freq: Two times a day (BID) | ORAL | Status: DC | PRN
  Administered 2015-07-06 – 2015-07-08 (×3): 5 mg via ORAL
  Filled 2015-07-06 (×3): qty 1

## 2015-07-06 MED ORDER — HYDROCHLOROTHIAZIDE 25 MG PO TABS
25.0000 mg | ORAL_TABLET | Freq: Every day | ORAL | Status: DC
  Administered 2015-07-06 – 2015-07-07 (×2): 25 mg via ORAL
  Filled 2015-07-06 (×3): qty 1

## 2015-07-06 MED ORDER — ROCURONIUM BROMIDE 100 MG/10ML IV SOLN
INTRAVENOUS | Status: DC | PRN
  Administered 2015-07-06: 35 mg via INTRAVENOUS

## 2015-07-06 MED ORDER — ONDANSETRON HCL 4 MG/2ML IJ SOLN
INTRAMUSCULAR | Status: AC
  Filled 2015-07-06: qty 2

## 2015-07-06 MED ORDER — LISINOPRIL 20 MG PO TABS
20.0000 mg | ORAL_TABLET | Freq: Every day | ORAL | Status: DC
  Administered 2015-07-06 – 2015-07-07 (×2): 20 mg via ORAL
  Filled 2015-07-06 (×3): qty 1

## 2015-07-06 MED ORDER — LIDOCAINE HCL (CARDIAC) 20 MG/ML IV SOLN
INTRAVENOUS | Status: DC | PRN
  Administered 2015-07-06: 80 mg via INTRAVENOUS

## 2015-07-06 MED ORDER — HYDROMORPHONE HCL 1 MG/ML IJ SOLN
0.5000 mg | INTRAMUSCULAR | Status: DC | PRN
  Administered 2015-07-06 (×2): 0.5 mg via INTRAVENOUS

## 2015-07-06 MED ORDER — GLYCOPYRROLATE 0.2 MG/ML IJ SOLN
INTRAMUSCULAR | Status: DC | PRN
  Administered 2015-07-06: .4 mg via INTRAVENOUS

## 2015-07-06 MED ORDER — VITAMIN D 1000 UNITS PO TABS
1000.0000 [IU] | ORAL_TABLET | Freq: Every day | ORAL | Status: DC
  Administered 2015-07-06 – 2015-07-08 (×3): 1000 [IU] via ORAL
  Filled 2015-07-06 (×3): qty 1

## 2015-07-06 MED ORDER — PHENOL 1.4 % MT LIQD: 1.0000 | OROMUCOSAL | Status: DC | PRN

## 2015-07-06 MED ORDER — DOCUSATE SODIUM 100 MG PO CAPS
100.0000 mg | ORAL_CAPSULE | Freq: Two times a day (BID) | ORAL | Status: DC
  Administered 2015-07-06 – 2015-07-08 (×5): 100 mg via ORAL
  Filled 2015-07-06 (×5): qty 1

## 2015-07-06 MED ORDER — HYDROMORPHONE HCL 1 MG/ML IJ SOLN
1.0000 mg | INTRAMUSCULAR | Status: DC | PRN
  Administered 2015-07-06 – 2015-07-08 (×10): 1 mg via INTRAVENOUS
  Filled 2015-07-06 (×10): qty 1

## 2015-07-06 MED ORDER — MENTHOL 3 MG MT LOZG: 1.0000 | LOZENGE | OROMUCOSAL | Status: DC | PRN

## 2015-07-06 MED ORDER — CEFAZOLIN SODIUM-DEXTROSE 2-3 GM-% IV SOLR
INTRAVENOUS | Status: DC | PRN
  Administered 2015-07-06: 2 g via INTRAVENOUS

## 2015-07-06 MED ORDER — PROPOFOL 10 MG/ML IV BOLUS
INTRAVENOUS | Status: DC | PRN
  Administered 2015-07-06: 50 mg via INTRAVENOUS
  Administered 2015-07-06: 150 mg via INTRAVENOUS

## 2015-07-06 MED ORDER — BENAZEPRIL-HYDROCHLOROTHIAZIDE 20-25 MG PO TABS: 1.0000 | ORAL_TABLET | Freq: Every day | ORAL | Status: DC

## 2015-07-06 MED ORDER — SENNOSIDES-DOCUSATE SODIUM 8.6-50 MG PO TABS: 1.0000 | ORAL_TABLET | Freq: Every evening | ORAL | Status: DC | PRN

## 2015-07-06 MED ORDER — NEOSTIGMINE METHYLSULFATE 10 MG/10ML IV SOLN
INTRAVENOUS | Status: DC | PRN
  Administered 2015-07-06: 3 mg via INTRAVENOUS

## 2015-07-06 MED ORDER — CEFAZOLIN SODIUM-DEXTROSE 2-3 GM-% IV SOLR: 2.0000 g | INTRAVENOUS | Status: DC

## 2015-07-06 MED ORDER — FLEET ENEMA 7-19 GM/118ML RE ENEM: 1.0000 | ENEMA | Freq: Once | RECTAL | Status: AC | PRN

## 2015-07-06 MED ORDER — ACETAMINOPHEN 650 MG RE SUPP: 650.0000 mg | Freq: Four times a day (QID) | RECTAL | Status: DC | PRN

## 2015-07-06 MED ORDER — GLYCOPYRROLATE 0.2 MG/ML IJ SOLN
INTRAMUSCULAR | Status: AC
  Filled 2015-07-06: qty 2

## 2015-07-06 MED ORDER — SERTRALINE HCL 25 MG PO TABS
25.0000 mg | ORAL_TABLET | Freq: Every day | ORAL | Status: DC
  Administered 2015-07-07 – 2015-07-08 (×2): 25 mg via ORAL
  Filled 2015-07-06 (×3): qty 1

## 2015-07-06 MED ORDER — SODIUM CHLORIDE 0.9 % IV SOLN
2000.0000 mg | INTRAVENOUS | Status: DC | PRN
  Administered 2015-07-06: 2000 mg via TOPICAL

## 2015-07-06 MED ORDER — OXYCODONE HCL 5 MG PO TABS
5.0000 mg | ORAL_TABLET | ORAL | Status: DC | PRN
  Administered 2015-07-06 – 2015-07-08 (×11): 10 mg via ORAL
  Filled 2015-07-06 (×11): qty 2

## 2015-07-06 MED ORDER — SODIUM CHLORIDE 0.9 % IV SOLN
INTRAVENOUS | Status: DC
Start: 2015-07-06 — End: 2015-07-06

## 2015-07-06 MED ORDER — NEOSTIGMINE METHYLSULFATE 10 MG/10ML IV SOLN
INTRAVENOUS | Status: AC
  Filled 2015-07-06: qty 1

## 2015-07-06 SURGICAL SUPPLY — 64 items
BANDAGE ELASTIC 4 VELCRO ST LF (GAUZE/BANDAGES/DRESSINGS) ×3 IMPLANT
BANDAGE ELASTIC 6 VELCRO ST LF (GAUZE/BANDAGES/DRESSINGS) ×3 IMPLANT
BANDAGE ESMARK 6X9 LF (GAUZE/BANDAGES/DRESSINGS) ×1 IMPLANT
BLADE SAGITTAL 25.0X1.19X90 (BLADE) ×2 IMPLANT
BLADE SAGITTAL 25.0X1.19X90MM (BLADE) ×1
BLADE SAW SAG 90X13X1.27 (BLADE) ×3 IMPLANT
BNDG ESMARK 6X9 LF (GAUZE/BANDAGES/DRESSINGS) ×3
BOWL SMART MIX CTS (DISPOSABLE) ×3 IMPLANT
CAP KNEE TOTAL 3 SIGMA ×3 IMPLANT
CEMENT HV SMART SET (Cement) ×6 IMPLANT
COVER SURGICAL LIGHT HANDLE (MISCELLANEOUS) ×3 IMPLANT
CUFF TOURNIQUET SINGLE 34IN LL (TOURNIQUET CUFF) ×3 IMPLANT
CUFF TOURNIQUET SINGLE 44IN (TOURNIQUET CUFF) IMPLANT
DRAPE IMP U-DRAPE 54X76 (DRAPES) ×3 IMPLANT
DRAPE INCISE IOBAN 66X45 STRL (DRAPES) IMPLANT
DRAPE ORTHO SPLIT 77X108 STRL (DRAPES) ×4
DRAPE SURG ORHT 6 SPLT 77X108 (DRAPES) ×2 IMPLANT
DRAPE U-SHAPE 47X51 STRL (DRAPES) ×3 IMPLANT
DRSG ADAPTIC 3X8 NADH LF (GAUZE/BANDAGES/DRESSINGS) ×3 IMPLANT
DRSG PAD ABDOMINAL 8X10 ST (GAUZE/BANDAGES/DRESSINGS) ×3 IMPLANT
DURAPREP 26ML APPLICATOR (WOUND CARE) ×3 IMPLANT
ELECT REM PT RETURN 9FT ADLT (ELECTROSURGICAL) ×3
ELECTRODE REM PT RTRN 9FT ADLT (ELECTROSURGICAL) ×1 IMPLANT
EVACUATOR 1/8 PVC DRAIN (DRAIN) ×3 IMPLANT
FACESHIELD WRAPAROUND (MASK) ×6 IMPLANT
FLOSEAL 10ML (HEMOSTASIS) IMPLANT
GAUZE SPONGE 4X4 12PLY STRL (GAUZE/BANDAGES/DRESSINGS) ×3 IMPLANT
GLOVE BIOGEL PI IND STRL 8 (GLOVE) ×4 IMPLANT
GLOVE BIOGEL PI INDICATOR 8 (GLOVE) ×8
GLOVE ORTHO TXT STRL SZ7.5 (GLOVE) ×9 IMPLANT
GLOVE SURG ORTHO 8.0 STRL STRW (GLOVE) ×9 IMPLANT
GOWN STRL REUS W/ TWL LRG LVL3 (GOWN DISPOSABLE) ×2 IMPLANT
GOWN STRL REUS W/ TWL XL LVL3 (GOWN DISPOSABLE) ×1 IMPLANT
GOWN STRL REUS W/TWL 2XL LVL3 (GOWN DISPOSABLE) ×3 IMPLANT
GOWN STRL REUS W/TWL LRG LVL3 (GOWN DISPOSABLE) ×4
GOWN STRL REUS W/TWL XL LVL3 (GOWN DISPOSABLE) ×2
HANDPIECE INTERPULSE COAX TIP (DISPOSABLE) ×2
HOOD PEEL AWAY FACE SHEILD DIS (HOOD) ×3 IMPLANT
IMMOBILIZER KNEE 22 UNIV (SOFTGOODS) IMPLANT
KIT BASIN OR (CUSTOM PROCEDURE TRAY) ×3 IMPLANT
KIT ROOM TURNOVER OR (KITS) ×3 IMPLANT
MANIFOLD NEPTUNE II (INSTRUMENTS) ×3 IMPLANT
NEEDLE 22X1 1/2 (OR ONLY) (NEEDLE) IMPLANT
NS IRRIG 1000ML POUR BTL (IV SOLUTION) ×3 IMPLANT
PACK TOTAL JOINT (CUSTOM PROCEDURE TRAY) ×3 IMPLANT
PACK UNIVERSAL I (CUSTOM PROCEDURE TRAY) ×3 IMPLANT
PAD ARMBOARD 7.5X6 YLW CONV (MISCELLANEOUS) ×6 IMPLANT
PAD CAST 4YDX4 CTTN HI CHSV (CAST SUPPLIES) ×1 IMPLANT
PADDING CAST COTTON 4X4 STRL (CAST SUPPLIES) ×2
PADDING CAST COTTON 6X4 STRL (CAST SUPPLIES) ×3 IMPLANT
SET HNDPC FAN SPRY TIP SCT (DISPOSABLE) ×1 IMPLANT
SPONGE GAUZE 4X4 12PLY STER LF (GAUZE/BANDAGES/DRESSINGS) ×3 IMPLANT
STAPLER VISISTAT 35W (STAPLE) ×3 IMPLANT
SUCTION FRAZIER TIP 10 FR DISP (SUCTIONS) ×3 IMPLANT
SUT ETHIBOND NAB CT1 #1 30IN (SUTURE) ×9 IMPLANT
SUT VIC AB 0 CT1 27 (SUTURE) ×2
SUT VIC AB 0 CT1 27XBRD ANBCTR (SUTURE) ×1 IMPLANT
SUT VIC AB 2-0 CT1 27 (SUTURE) ×4
SUT VIC AB 2-0 CT1 TAPERPNT 27 (SUTURE) ×2 IMPLANT
SYR CONTROL 10ML LL (SYRINGE) IMPLANT
TOWEL OR 17X24 6PK STRL BLUE (TOWEL DISPOSABLE) ×3 IMPLANT
TOWEL OR 17X26 10 PK STRL BLUE (TOWEL DISPOSABLE) ×3 IMPLANT
TRAY FOLEY CATH 16FRSI W/METER (SET/KITS/TRAYS/PACK) IMPLANT
WATER STERILE IRR 1000ML POUR (IV SOLUTION) IMPLANT

## 2015-07-06 NOTE — Anesthesia Preprocedure Evaluation (Addendum)
Anesthesia Evaluation  Patient identified by MRN, date of birth, ID band Patient awake    Reviewed: Allergy & Precautions, NPO status , Patient's Chart, lab work & pertinent test results  History of Anesthesia Complications (+) PONV  Airway Mallampati: III       Dental  (+) Teeth Intact, Dental Advisory Given   Pulmonary    Pulmonary exam normal       Cardiovascular hypertension, + CAD Normal cardiovascular exam    Neuro/Psych CVA, Residual Symptoms    GI/Hepatic GERD-  ,  Endo/Other    Renal/GU      Musculoskeletal  (+) Arthritis -,   Abdominal   Peds  Hematology   Anesthesia Other Findings   Reproductive/Obstetrics                           Anesthesia Physical Anesthesia Plan  ASA: III  Anesthesia Plan: General   Post-op Pain Management:    Induction: Intravenous  Airway Management Planned: Oral ETT  Additional Equipment:   Intra-op Plan:   Post-operative Plan: Extubation in OR  Informed Consent: I have reviewed the patients History and Physical, chart, labs and discussed the procedure including the risks, benefits and alternatives for the proposed anesthesia with the patient or authorized representative who has indicated his/her understanding and acceptance.     Plan Discussed with: CRNA, Anesthesiologist and Surgeon  Anesthesia Plan Comments:         Anesthesia Quick Evaluation

## 2015-07-06 NOTE — Brief Op Note (Signed)
07/06/2015  12:50 PM  PATIENT:  Diane [name]  64 y.o. female  PRE-OPERATIVE DIAGNOSIS:  OA LEFT KNEE  POST-OPERATIVE DIAGNOSIS:  OA LEFT KNEE  PROCEDURE:  Procedure(s): TOTAL KNEE ARTHROPLASTY (Left)  SURGEON:  Surgeon(s) and Role:    * Earlie Server, MD - Primary  PHYSICIAN ASSISTANT: Chriss Czar, PA-C  ASSISTANTS:   ANESTHESIA:   regional and general  EBL:  Total I/O In: 1000 [I.V.:1000] Out: 100 [Blood:100]  BLOOD ADMINISTERED:none  DRAINS: 1 hemovac drain left lateral knee self suction   LOCAL MEDICATIONS USED:  NONE  SPECIMEN:  No Specimen  DISPOSITION OF SPECIMEN:  N/A  COUNTS:  YES  TOURNIQUET:   Total Tourniquet Time Documented: Thigh (Left) - 59 minutes Total: Thigh (Left) - 59 minutes   DICTATION: .Other Dictation: Dictation Number unknown  PLAN OF CARE: Admit to inpatient   PATIENT DISPOSITION:  PACU - hemodynamically stable.   Delay start of Pharmacological VTE agent (>24hrs) due to surgical blood loss or risk of bleeding: yes

## 2015-07-06 NOTE — Progress Notes (Signed)
Orthopedic Tech Progress Note Patient Details:  [name] 1951-07-03 [id number] Delivered Bone Foam to pt.'s nurse. Patient ID: Diane [name], female   DOB: 1951/07/04, 64 y.o.   MRN: [id number]   [name] 07/06/2015, 5:01 PM

## 2015-07-06 NOTE — Progress Notes (Signed)
Orthopedic Tech Progress Note Patient Details:  Diane [name] 1951/01/10 [id number] Applied CPM to LLE.  Applied OHF with trapeze to pt.'s bed. CPM Left Knee CPM Left Knee: On Left Knee Flexion (Degrees): 90 Left Knee Extension (Degrees): 0 Additional Comments: Kenney, [name] L 07/06/2015, 4:49 PM

## 2015-07-06 NOTE — Interval H&P Note (Signed)
History and Physical Interval Note:  07/06/2015 7:25 AM  Diane [name]  has presented today for surgery, with the diagnosis of OA LEFT KNEE  The various methods of treatment have been discussed with the patient and family. After consideration of risks, benefits and other options for treatment, the patient has consented to  Procedure(s): TOTAL KNEE ARTHROPLASTY (Left) as a surgical intervention .  The patient's history has been reviewed, patient examined, no change in status, stable for surgery.  I have reviewed the patient's chart and labs.  Questions were answered to the patient's satisfaction.     Kathlene Yano JR,W D

## 2015-07-06 NOTE — Progress Notes (Signed)
Orthopedic Tech Progress Note Patient Details:  Diane [name] 02-15-1951 [id number]  CPM Left Knee CPM Left Knee: On Left Knee Flexion (Degrees): 90 Left Knee Extension (Degrees): 0   Asia R Thompson 07/06/2015, 1:52 PM

## 2015-07-06 NOTE — Evaluation (Signed)
Physical Therapy Evaluation Patient Details Name: Diane [name] MRN: [id number] DOB: [birth year] Today's Date: 07/06/2015   History of Present Illness  Lt TKA  Clinical Impression  Pt is s/p TKA resulting in the deficits listed below (see PT Problem List). Pt will benefit from skilled PT to increase their independence and safety with mobility to allow discharge home with assistance.       Follow Up Recommendations Home health PT    Equipment Recommendations  None recommended by PT    Recommendations for Other Services       Precautions / Restrictions Precautions Precautions: Fall;Knee Required Braces or Orthoses: Knee Immobilizer - Left Knee Immobilizer - Left: On when out of bed or walking Restrictions Weight Bearing Restrictions: Yes LLE Weight Bearing: Weight bearing as tolerated      Mobility  Bed Mobility Overal bed mobility: Needs Assistance Bed Mobility: Supine to Sit;Sit to Supine     Supine to sit: Mod assist Sit to supine: Mod assist   General bed mobility comments: assist with Lt LE  Transfers Overall transfer level: Needs assistance Equipment used: Rolling walker (2 wheeled) Transfers: Sit to/from Stand Sit to Stand: Min assist         General transfer comment: stood edge of bed X 2 minutes  Ambulation/Gait                Stairs            Wheelchair Mobility    Modified Rankin (Stroke Patients Only)       Balance Overall balance assessment: Needs assistance Sitting-balance support: Bilateral upper extremity supported Sitting balance-Leahy Scale: Poor     Standing balance support: Bilateral upper extremity supported Standing balance-Leahy Scale: Poor                               Pertinent Vitals/Pain Pain Assessment: 0-10 Pain Score: 6  Pain Location: Lt knee Pain Descriptors / Indicators: Aching Pain Intervention(s): Limited activity within patient's tolerance;Monitored during session;Repositioned     Home Living Family/patient expects to be discharged to:: Private residence Living Arrangements: Spouse/significant other Available Help at Discharge: Family Type of Home: House Home Access: Stairs to enter Entrance Stairs-Rails: Can reach both Entrance Stairs-Number of Steps: 4  Home Layout: One level Home Equipment: Environmental consultant - 2 wheels      Prior Function Level of Independence: Independent               Hand Dominance        Extremity/Trunk Assessment               Lower Extremity Assessment: Overall WFL for tasks assessed (Rt LE)         Communication   Communication: No difficulties  Cognition Arousal/Alertness: Awake/alert Behavior During Therapy: WFL for tasks assessed/performed Overall Cognitive Status: Within Functional Limits for tasks assessed                      General Comments      Exercises        Assessment/Plan    PT Assessment Patient needs continued PT services  PT Diagnosis Difficulty walking;Generalized weakness   PT Problem List Decreased strength;Decreased range of motion;Decreased activity tolerance;Decreased balance;Decreased mobility  PT Treatment Interventions DME instruction;Gait training;Stair training;Functional mobility training;Therapeutic activities;Therapeutic exercise;Patient/family education   PT Goals (Current goals can be found in the Care Plan section) Acute Rehab PT Goals Patient Stated  Goal: To play baskeball with her grandson PT Goal Formulation: With patient Time For Goal Achievement: 07/20/15 Potential to Achieve Goals: Good    Frequency 7X/week   Barriers to discharge        Co-evaluation               End of Session Equipment Utilized During Treatment: Gait belt;Left knee immobilizer Activity Tolerance: Patient tolerated treatment well Patient left: in bed;with call bell/phone within reach;with family/visitor present;with SCD's reapplied           Time: 1551-1620 PT Time  Calculation (min) (ACUTE ONLY): 29 min   Charges:   PT Evaluation $Initial PT Evaluation Tier I: 1 Procedure PT Treatments $Therapeutic Activity: 8-22 mins   PT G Codes:        Cassell Clement, PT, CSCS Pager [phone] Office [phone]  07/06/2015, 4:26 PM

## 2015-07-06 NOTE — Care Management Note (Signed)
Case Management Note  Patient Details  Name: Diane [name] MRN: [id number] Date of Birth: 1951/09/01  Subjective/Objective:    64 yr old female s/p left total knee arthroplasty                Action/Plan:  Patient was preoperatively setup with [facility], no changes. DME has been delivered to patient. Has family support at discharge.    Expected Discharge Date:    07/08/15              Expected Discharge Plan:  [place]  In-House Referral:     Discharge planning Services  CM Consult  Post Acute Care Choice:  Home Health Choice offered to:  Patient  DME Arranged:    DME Agency:  [employer]  HH Arranged:  PT HH Agency:  [place]  Status of Service:  Completed, signed off  Medicare Important Message Given:    Date Medicare IM Given:    Medicare IM give by:    Date Additional Medicare IM Given:    Additional Medicare Important Message give by:     If discussed at [place] of Stay Meetings, dates discussed:    Additional Comments:  Ninfa Meeker, RN 07/06/2015, 3:57 PM

## 2015-07-06 NOTE — Plan of Care (Signed)
Problem: Consults Goal: Diagnosis- Total Joint Replacement Outcome: Completed/Met Date Met:  07/06/15 Primary Total Knee left

## 2015-07-06 NOTE — Transfer of Care (Signed)
Immediate Anesthesia Transfer of Care Note  Patient: Diane [name]  Procedure(s) Performed: Procedure(s): TOTAL KNEE ARTHROPLASTY (Left)  Patient Location: PACU  Anesthesia Type:General  Level of Consciousness: awake, alert  and oriented  Airway & Oxygen Therapy: Patient Spontanous Breathing and Patient connected to nasal cannula oxygen  Post-op Assessment: Report given to RN and Post -op Vital signs reviewed and stable  Post vital signs: Reviewed and stable  Last Vitals:  Filed Vitals:   07/06/15 0943  BP: 113/71  Pulse: 85  Temp:   Resp: 18    Complications: No apparent anesthesia complications

## 2015-07-06 NOTE — H&P (View-Only) (Signed)
TOTAL KNEE ADMISSION H&P  Patient is being admitted for left total knee arthroplasty.  Subjective:  Chief Complaint:left knee pain.  HPI: Diane [name], 64 y.o. female, has a history of pain and functional disability in the left knee due to arthritis and has failed non-surgical conservative treatments for greater than 12 weeks to includeNSAID's and/or analgesics, corticosteriod injections, viscosupplementation injections, flexibility and strengthening excercises, use of assistive devices, weight reduction as appropriate and activity modification.  Onset of symptoms was gradual, starting 2 years ago with gradually worsening course since that time. The patient noted prior procedures on the knee to include  arthroscopy and menisectomy on the left knee(s).  Patient currently rates pain in the left knee(s) at 8 out of 10 with activity. Patient has night pain, worsening of pain with activity and weight bearing, pain that interferes with activities of daily living, pain with passive range of motion, crepitus and joint swelling.  Patient has evidence of periarticular osteophytes and joint space narrowing by imaging studies.There is no active infection.  Patient Active Problem List   Diagnosis Date Noted  . Atypical nevus 03/27/2015  . Carotid bruit 11/04/2013  . Hyperlipidemia   . Ejection fraction   . CAD (coronary artery disease)   . History of CVA (cerebrovascular accident) 10/28/2013  . Dyslipidemia 10/28/2013  . Dyspnea on exertion 10/28/2013  . Generalized anxiety disorder 04/11/2013  . Routine general medical examination at a health care facility 04/11/2013  . GERD (gastroesophageal reflux disease) 02/02/2013  . Hypertension 02/02/2013   Past Medical History  Diagnosis Date  . 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 Hypertension   . GERD (gastroesophageal reflux disease)   . Hyperlipidemia   . Ejection fraction   . CAD (coronary artery  disease)   . Arthritis   . PONV (postoperative nausea and vomiting)   . 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 Cesarean section    . Left heart catheterization with coronary angiogram N/A 10/28/2013    Procedure: LEFT HEART CATHETERIZATION WITH CORONARY ANGIOGRAM;  Surgeon: Larey Dresser, MD;  Location: [facility] CATH LAB;  Service: Cardiovascular;  Laterality: N/A;  . Tubal ligation    . Cholecystectomy  1990  . Knee arthroscopy with medial menisectomy Left 01/05/2015    Procedure: LEFT KNEE ARTHROSCOPY WITH MEDIAL AND LATERAL MENISECTOMY; DEBRIDEMENT LATERAL PATELLA FEMORAL ;  Surgeon: Yvette Rack., MD;  Location: [facility];  Service: Orthopedics;  Laterality: Left;  . Chondroplasty Left 01/05/2015    Procedure: CHONDROPLASTY;  Surgeon: Yvette Rack., MD;  Location: [facility];  Service: Orthopedics;  Laterality: Left;     (Not in a hospital admission) No Known Allergies  History  Substance Use Topics  . Smoking status: Never Smoker   . Smokeless tobacco: Never Used     Comment: LIVES WITH 2 SMOKERS   . Alcohol Use: Yes     Comment: Rare social ETOH    Family History  Problem Relation Age of Onset  . Stroke Father   . Heart failure Father      Review of Systems  Constitutional: Positive for weight loss. Negative for fever, chills, malaise/fatigue and diaphoresis.  HENT: Positive for hearing loss. Negative for congestion, ear discharge, ear pain, nosebleeds, sore throat and tinnitus.   Eyes: Negative.   Respiratory: Negative.  Negative for  stridor.   Cardiovascular: Positive for leg swelling. Negative for chest pain, palpitations, orthopnea, claudication and PND.  Gastrointestinal: Positive for nausea, vomiting, diarrhea and constipation. Negative for heartburn, abdominal pain, blood in stool and melena.  Genitourinary: Positive for dysuria, urgency and frequency. Negative for hematuria and flank  pain.  Musculoskeletal: Positive for joint pain. Negative for falls.  Skin: Negative.   Neurological: Positive for dizziness and headaches. Negative for tingling, tremors, sensory change, speech change, focal weakness, seizures, loss of consciousness and weakness.  Endo/Heme/Allergies: Negative.   Psychiatric/Behavioral: Negative for depression. The patient is nervous/anxious.     Objective:  Physical Exam  Constitutional: She is oriented to person, place, and time. She appears well-developed and well-nourished. No distress.  HENT:  Head: Normocephalic and atraumatic.  Nose: Nose normal.  Eyes: Conjunctivae and EOM are normal. Pupils are equal, round, and reactive to light.  Neck: Normal range of motion. Neck supple.  Cardiovascular: Normal rate, regular rhythm, normal heart sounds and intact distal pulses.   Respiratory: Effort normal and breath sounds normal. No respiratory distress. She has no wheezes. She has no rales. She exhibits no tenderness.  GI: Bowel sounds are normal. She exhibits no distension. There is no tenderness.  Musculoskeletal:       Left knee: She exhibits swelling. She exhibits no effusion, no laceration, no erythema, no LCL laxity and no MCL laxity. Tenderness found.  Lymphadenopathy:    She has no cervical adenopathy.  Neurological: She is alert and oriented to person, place, and time.  Skin: Skin is warm and dry. No rash noted. No erythema.  Psychiatric: She has a normal mood and affect. Her behavior is normal.    Vital signs in last 24 hours: @VSRANGES @  Labs:   Estimated body mass index is 36.11 kg/(m^2) as calculated from the following:   Height as of 01/05/15: 5' 2.5" (1.588 m).   Weight as of 03/27/15: 91.06 kg (200 lb 12 oz).   Imaging Review Plain radiographs demonstrate moderate degenerative joint disease of the left knee(s). The overall alignment isneutral. The bone quality appears to be good for age and reported activity  level.  Assessment/Plan:  End stage arthritis, left knee   The patient history, physical examination, clinical judgment of the provider and imaging studies are consistent with end stage degenerative joint disease of the left knee(s) and total knee arthroplasty is deemed medically necessary. The treatment options including medical management, injection therapy arthroscopy and arthroplasty were discussed at length. The risks and benefits of total knee arthroplasty were presented and reviewed. The risks due to aseptic loosening, infection, stiffness, patella tracking problems, thromboembolic complications and other imponderables were discussed. The patient acknowledged the explanation, agreed to proceed with the plan and consent was signed. Patient is being admitted for inpatient treatment for surgery, pain control, PT, OT, prophylactic antibiotics, VTE prophylaxis, progressive ambulation and ADL's and discharge planning. The patient is planning to be discharged home with home health services

## 2015-07-06 NOTE — Anesthesia Procedure Notes (Addendum)
Procedure Name: Intubation Performed by: Merdis Delay Pre-anesthesia Checklist: Patient identified, Emergency Drugs available, Suction available, Patient being monitored and Timeout performed Patient Re-evaluated:Patient Re-evaluated prior to inductionOxygen Delivery Method: Circle system utilized Preoxygenation: Pre-oxygenation with 100% oxygen Intubation Type: IV induction Ventilation: Mask ventilation without difficulty Laryngoscope Size: Mac and 3 Grade View: Grade III Tube size: 7.0 mm Number of attempts: 2 Airway Equipment and Method: Bougie stylet Placement Confirmation: ETT inserted through vocal cords under direct vision,  breath sounds checked- equal and bilateral,  positive ETCO2 and CO2 detector Secured at: 22 cm Tube secured with: Tape Dental Injury: Teeth and Oropharynx as per pre-operative assessment  Difficulty Due To: Difficulty was unanticipated Comments: Anterior airway. DL x1 with MAC 3- esophageal intubation. DL again with bougie- bottom of cords visible with steep angle.    Anesthesia Regional Block:  Femoral nerve block  Pre-Anesthetic Checklist: ,, timeout performed, Correct Patient, Correct Site, Correct Laterality, Correct Procedure, Correct Position, site marked, Risks and benefits discussed,  Surgical consent,  Pre-op evaluation,  At surgeon's request and post-op pain management  Laterality: Left  Prep: Maximum Sterile Barrier Precautions used, chloraprep and alcohol swabs       Needles:  Injection technique: Single-shot  Needle Type: Stimulator Needle - 80          Additional Needles:  Procedures: Doppler guided and nerve stimulator Femoral nerve block  Nerve Stimulator or Paresthesia:  Response: 0.5 mA, 0.1 ms, 5 cm  Additional Responses:   Narrative:  Start time: 07/06/2015 10:00 AM End time: 07/06/2015 10:10 AM Injection made incrementally with aspirations every 5 mL.  Performed by: Personally   Additional Notes: Pt accepts  procedure w/ risks. 20cc 0.5 % Marcaine w/ epi w/o difficulty or discomfort. GES

## 2015-07-06 NOTE — Care Management Utilization Note (Signed)
Utilization review completed. Nahomy Limburg, RN Case Manager [phone]. 

## 2015-07-06 NOTE — Anesthesia Postprocedure Evaluation (Signed)
  Anesthesia Post-op Note  Patient: Diane [name]  Procedure(s) Performed: Procedure(s): TOTAL KNEE ARTHROPLASTY (Left)  Patient Location: PACU  Anesthesia Type:General and GA combined with regional for post-op pain  Level of Consciousness: awake, alert , oriented and patient cooperative  Airway and Oxygen Therapy: Patient Spontanous Breathing  Post-op Pain: mild  Post-op Assessment: Post-op Vital signs reviewed, Patient's Cardiovascular Status Stable, Respiratory Function Stable, Patent Airway, No signs of Nausea or vomiting and Pain level controlled LLE Motor Response: Purposeful movement, Responds to commands LLE Sensation: Numbness, No tingling          Post-op Vital Signs: stable  Last Vitals:  Filed Vitals:   07/06/15 1414  BP: 166/77  Pulse: 72  Temp: 37 C  Resp: 17    Complications: No apparent anesthesia complications

## 2015-07-07 LAB — BASIC METABOLIC PANEL
ANION GAP: 8 (ref 5–15)
BUN: 6 mg/dL (ref 6–20)
CALCIUM: 8.6 mg/dL — AB (ref 8.9–10.3)
CHLORIDE: 91 mmol/L — AB (ref 101–111)
CO2: 32 mmol/L (ref 22–32)
Creatinine, Ser: 0.53 mg/dL (ref 0.44–1.00)
GFR calc Af Amer: >60 mL/min (ref >60)
GFR calc non Af Amer: >60 mL/min (ref >60)
Glucose, Bld: 123 mg/dL — ABNORMAL HIGH (ref 65–99)
Potassium: 3.9 mmol/L (ref 3.5–5.1)
SODIUM: 131 mmol/L — AB (ref 135–145)

## 2015-07-07 LAB — CBC
HEMATOCRIT: 31 % — AB (ref 36.0–46.0)
Hemoglobin: 10.5 g/dL — ABNORMAL LOW (ref 12.0–15.0)
MCH: 32.4 pg (ref 26.0–34.0)
MCHC: 33.9 g/dL (ref 30.0–36.0)
MCV: 95.7 fL (ref 78.0–100.0)
Platelets: 166 10*3/uL (ref 150–400)
RBC: 3.24 MIL/uL — ABNORMAL LOW (ref 3.87–5.11)
RDW: 12.2 % (ref 11.5–15.5)
WBC: 9 10*3/uL (ref 4.0–10.5)

## 2015-07-07 NOTE — Progress Notes (Signed)
Physical Therapy Treatment Patient Details Name: Diane [name] MRN: [id number] DOB: 04/30/1951 Today's Date: 07/07/2015    History of Present Illness Lt TKA    PT Comments    Patient reports pain was 'over 10' earlier, but now had pain medication and is coming down to 8/10.  Agreeable to therapy.  MIN assist overall for room level mobility and short (40') ambulation.  Good effort with exercises and ability to maneuver exceeded patient expectations.  Patient is appropriate to continue with skilled PT services.   Follow Up Recommendations  Home health PT     Equipment Recommendations  None recommended by PT    Recommendations for Other Services       Precautions / Restrictions Precautions Precautions: Fall;Knee Required Braces or Orthoses: Knee Immobilizer - Left Knee Immobilizer - Left: On when out of bed or walking Restrictions Weight Bearing Restrictions: Yes LLE Weight Bearing: Weight bearing as tolerated    Mobility  Bed Mobility Overal bed mobility: Needs Assistance Bed Mobility: Sit to Supine     Supine to sit: Min assist Sit to supine: Mod assist   General bed mobility comments: Assist provided to support left LE.  Transfers Overall transfer level: Needs assistance Equipment used: Rolling walker (2 wheeled) Transfers: Sit to/from Stand Sit to Stand: Min assist         General transfer comment: Verbal cues for safe hand placment and sequencing.  Ambulation/Gait Ambulation/Gait assistance: Min assist Ambulation Distance (Feet): 40 Feet Assistive device: Rolling walker (2 wheeled) Gait Pattern/deviations: Antalgic;Step-to pattern         Stairs            Wheelchair Mobility    Modified Rankin (Stroke Patients Only)       Balance     Sitting balance-Leahy Scale: Poor       Standing balance-Leahy Scale: Poor                      Cognition Arousal/Alertness: Awake/alert Behavior During Therapy: WFL for tasks  assessed/performed Overall Cognitive Status: Within Functional Limits for tasks assessed                      Exercises Total Joint Exercises Ankle Circles/Pumps: AROM;Both;10 reps;Seated Quad Sets: AROM;Left;5 reps;Seated Straight Leg Raises: AAROM;Both;10 reps;Seated Long Arc Quad: AAROM;5 reps;Seated;Left Knee Flexion: AROM;Left;5 reps;Seated    General Comments        Pertinent Vitals/Pain Pain Assessment: 0-10 Pain Score: 8  Pain Location: L knee Pain Descriptors / Indicators: Aching;Sore Pain Intervention(s): Premedicated before session;Limited activity within patient's tolerance;Monitored during session;Repositioned    Home Living Family/patient expects to be discharged to:: Private residence Living Arrangements: Spouse/significant other Available Help at Discharge: Family;Available 24 hours/day Type of Home: House Home Access: Stairs to enter Entrance Stairs-Rails: Can reach both Home Layout: One level Home Equipment: Walker - 2 wheels;Bedside commode;Shower seat;Hand held shower head;Adaptive equipment      Prior Function Level of Independence: Independent          PT Goals (current goals can now be found in the care plan section) Acute Rehab PT Goals Patient Stated Goal: to be independent PT Goal Formulation: With patient Time For Goal Achievement: 07/20/15 Potential to Achieve Goals: Good Progress towards PT goals: Progressing toward goals    Frequency  7X/week    PT Plan Current plan remains appropriate    Co-evaluation             End of  Session Equipment Utilized During Treatment: Gait belt;Left knee immobilizer Activity Tolerance: Patient tolerated treatment well Patient left: in bed;with call bell/phone within reach;with family/visitor present     Time: 1210-1255 PT Time Calculation (min) (ACUTE ONLY): 45 min  Charges:  $Gait Training: 8-22 mins $Therapeutic Exercise: 8-22 mins $Therapeutic Activity: 8-22 mins                     G Codes:      Diane [name] L August 03, 2015, 2:35 PM

## 2015-07-07 NOTE — Op Note (Signed)
NAME:  CARMAN, AUXIER NO.:  [id number]  MEDICAL RECORD NO.:  [id number]  LOCATION:  [id number]                        FACILITY:  [facility]  PHYSICIAN:  Lockie Pares, M.D.    DATE OF BIRTH:  04/21/1951  DATE OF PROCEDURE:  07/06/2015 DATE OF DISCHARGE:                              OPERATIVE REPORT   PREOPERATIVE DIAGNOSIS:  Severe osteoarthritis, left knee.  POSTOPERATIVE DIAGNOSIS:  Severe osteoarthritis, left knee.  OPERATION:  Left total knee replacement (Sigma size 3 femur, 10 mm bearing with 35 mm patella).  SURGEON:  Lockie Pares, M.D.  ASSISTANT:  Chriss Czar, PA-C.  ANESTHESIA:  General with nerve block.  TOURNIQUET TIME:  59 minutes.  DESCRIPTION OF PROCEDURE:  Sterile prep and drape, exsanguination of leg, inflation to 350, straight skin incision made with a medial parapatellar approach to the knee made.  We cut the 11 mm 5 degree valgus cut on the femur followed by cutting about 3-4 mm below the most diseased medial compartment, extension gap measured at 10 mm.  Attention was next directed at the distal femur which was sized to be a size 3. We placed the provisional pins with the appropriate degree of external rotation.  Then the all in 1 cutting block to cut the anterior-posterior femur and chamfers.  PCL was completely released.  Small bits of menisci were removed and the flexion gap was measured at 10 mm as well.  Keel hole was cut for the tibia followed by placement of trial tibia.  Box cut was made for the femur, followed by trial of femur.  Patella was cut, leaving about 15-16 mm of native patella for 3-pronged patellar trial.  All trials were placed.  Full extension was noted.  Good balancing of the ligaments with no tendency for bearing instability.  We then inserted the final components, followed tibia, followed by femur and patella with the trial bearing in place.  Cement was allowed to harden. Excess cement was removed.  Trial  bearing was removed.  Small bits of cement were removed from the posterior aspect of the knee. Tourniquet was released.  Small bleeders were coagulated.  We elected to place a Hemovac drain superolaterally.  Closure was affected with #1 Ethibond, 0 2-0 Vicryl, and skin clips.  Lightly compressive sterile dressing applied, taken to recovery room in stable condition.     Lockie Pares, M.D.   ______________________________ Lockie Pares, M.D.    WDC/MEDQ  D:  07/06/2015  T:  07/07/2015  Job:  [id number]

## 2015-07-07 NOTE — Discharge Instructions (Signed)
INSTRUCTIONS AFTER JOINT REPLACEMENT  ° °o Remove items at home which could result in a fall. This includes throw rugs or furniture in walking pathways °o ICE to the affected joint every three hours while awake for 30 minutes at a time, for at least the first 3-5 days, and then as needed for pain and swelling.  Continue to use ice for pain and swelling. You may notice swelling that will progress down to the foot and ankle.  This is normal after surgery.  Elevate your leg when you are not up walking on it.   °o Continue to use the breathing machine you got in the hospital (incentive spirometer) which will help keep your temperature down.  It is common for your temperature to cycle up and down following surgery, especially at night when you are not up moving around and exerting yourself.  The breathing machine keeps your lungs expanded and your temperature down. ° ° °DIET:  As you were doing prior to hospitalization, we recommend a well-balanced diet. ° °DRESSING / WOUND CARE / SHOWERING ° °You may change your dressing 3-5 days after surgery.  Then change the dressing every day with sterile gauze.  Please use good hand washing techniques before changing the dressing.  Do not use any lotions or creams on the incision until instructed by your surgeon. ° °ACTIVITY ° °o Increase activity slowly as tolerated, but follow the weight bearing instructions below.   °o No driving for 6 weeks or until further direction given by your physician.  You cannot drive while taking narcotics.  °o No lifting or carrying greater than 10 lbs. until further directed by your surgeon. °o Avoid periods of inactivity such as sitting longer than an hour when not asleep. This helps prevent blood clots.  °o You may return to work once you are authorized by your doctor.  ° ° ° °WEIGHT BEARING  ° °Weight bearing as tolerated with assist device (walker, cane, etc) as directed, use it as long as suggested by your surgeon or therapist, typically at  least 4-6 weeks. ° ° °EXERCISES ° °Results after joint replacement surgery are often greatly improved when you follow the exercise, range of motion and muscle strengthening exercises prescribed by your doctor. Safety measures are also important to protect the joint from further injury. Any time any of these exercises cause you to have increased pain or swelling, decrease what you are doing until you are comfortable again and then slowly increase them. If you have problems or questions, call your caregiver or physical therapist for advice.  ° °Rehabilitation is important following a joint replacement. After just a few days of immobilization, the muscles of the leg can become weakened and shrink (atrophy).  These exercises are designed to build up the tone and strength of the thigh and leg muscles and to improve motion. Often times heat used for twenty to thirty minutes before working out will loosen up your tissues and help with improving the range of motion but do not use heat for the first two weeks following surgery (sometimes heat can increase post-operative swelling).  ° °These exercises can be done on a training (exercise) mat, on the floor, on a table or on a bed. Use whatever works the best and is most comfortable for you.    Use music or television while you are exercising so that the exercises are a pleasant break in your day. This will make your life better with the exercises acting as a break   in your routine that you can look forward to.   Perform all exercises about fifteen times, three times per day or as directed.  You should exercise both the operative leg and the other leg as well. ° °Exercises include: °  °• Quad Sets - Tighten up the muscle on the front of the thigh (Quad) and hold for 5-10 seconds.   °• Straight Leg Raises - With your knee straight (if you were given a brace, keep it on), lift the leg to 60 degrees, hold for 3 seconds, and slowly lower the leg.  Perform this exercise against  resistance later as your leg gets stronger.  °• Leg Slides: Lying on your back, slowly slide your foot toward your buttocks, bending your knee up off the floor (only go as far as is comfortable). Then slowly slide your foot back down until your leg is flat on the floor again.  °• Angel Wings: Lying on your back spread your legs to the side as far apart as you can without causing discomfort.  °• Hamstring Strength:  Lying on your back, push your heel against the floor with your leg straight by tightening up the muscles of your buttocks.  Repeat, but this time bend your knee to a comfortable angle, and push your heel against the floor.  You may put a pillow under the heel to make it more comfortable if necessary.  ° °A rehabilitation program following joint replacement surgery can speed recovery and prevent re-injury in the future due to weakened muscles. Contact your doctor or a physical therapist for more information on knee rehabilitation.  ° ° °CONSTIPATION ° °Constipation is defined medically as fewer than three stools per week and severe constipation as less than one stool per week.  Even if you have a regular bowel pattern at home, your normal regimen is likely to be disrupted due to multiple reasons following surgery.  Combination of anesthesia, postoperative narcotics, change in appetite and fluid intake all can affect your bowels.  ° °YOU MUST use at least one of the following options; they are listed in order of increasing strength to get the job done.  They are all available over the counter, and you may need to use some, POSSIBLY even all of these options:   ° °Drink plenty of fluids (prune juice may be helpful) and high fiber foods °Colace 100 mg by mouth twice a day  °Senokot for constipation as directed and as needed Dulcolax (bisacodyl), take with full glass of water  °Miralax (polyethylene glycol) once or twice a day as needed. ° °If you have tried all these things and are unable to have a bowel  movement in the first 3-4 days after surgery call either your surgeon or your primary doctor.   ° °If you experience loose stools or diarrhea, hold the medications until you stool forms back up.  If your symptoms do not get better within 1 week or if they get worse, check with your doctor.  If you experience "the worst abdominal pain ever" or develop nausea or vomiting, please contact the office immediately for further recommendations for treatment. ° ° °ITCHING:  If you experience itching with your medications, try taking only a single pain pill, or even half a pain pill at a time.  You can also use Benadryl over the counter for itching or also to help with sleep.  ° °TED HOSE STOCKINGS:  Use stockings on both legs until for at least 2 weeks or as   directed by physician office. They may be removed at night for sleeping.  MEDICATIONS:  See your medication summary on the After Visit Summary that nursing will review with you.  You may have some home medications which will be placed on hold until you complete the course of blood thinner medication.  It is important for you to complete the blood thinner medication as prescribed.  PRECAUTIONS:  If you experience chest pain or shortness of breath - call 911 immediately for transfer to the hospital emergency department.   If you develop a fever greater that 101 F, purulent drainage from wound, increased redness or drainage from wound, foul odor from the wound/dressing, or calf pain - CONTACT YOUR SURGEON.                                                   FOLLOW-UP APPOINTMENTS:  If you do not already have a post-op appointment, please call the office for an appointment to be seen by your surgeon.  Guidelines for how soon to be seen are listed in your After Visit Summary, but are typically between 1-4 weeks after surgery.  OTHER INSTRUCTIONS:   Knee Replacement:  Do not place pillow under knee, focus on keeping the knee straight while resting. CPM  instructions: 0-90 degrees, 2 hours in the morning, 2 hours in the afternoon, and 2 hours in the evening. Place foam block, curve side up under heel at all times except when in CPM or when walking.  DO NOT modify, tear, cut, or change the foam block in any way.  MAKE SURE YOU:   Understand these instructions.   Get help right away if you are not doing well or get worse.    Thank you for letting us be a part of your medical care team.  It is a privilege we respect greatly.  We hope these instructions will help you stay on track for a fast and full recovery!  ------------------------------------------------  Information on my medicine - ELIQUIS (apixaban)  This medication education was reviewed with me or my healthcare representative as part of my discharge preparation.  The pharmacist that spoke with me during my hospital stay was:  Roma Schanz, [facility]  Why was Eliquis prescribed for you? Eliquis was prescribed for you to reduce the risk of blood clots forming after orthopedic surgery.    What do You need to know about Eliquis? Take your Eliquis TWICE DAILY - one tablet in the morning and one tablet in the evening with or without food.  It would be best to take the dose about the same time each day.  If you have difficulty swallowing the tablet whole please discuss with your pharmacist how to take the medication safely.  Take Eliquis exactly as prescribed by your doctor and DO NOT stop taking Eliquis without talking to the doctor who prescribed the medication.  Stopping without other medication to take the place of Eliquis may increase your risk of developing a clot.  After discharge, you should have regular check-up appointments with your healthcare provider that is prescribing your Eliquis.  What do you do if you miss a dose? If a dose of ELIQUIS is not taken at the scheduled time, take it as soon as possible on the same day and twice-daily administration should be resumed.   The dose should not  be doubled to make up for a missed dose.  Do not take more than one tablet of ELIQUIS at the same time.  Important Safety Information A possible side effect of Eliquis is bleeding. You should call your healthcare provider right away if you experience any of the following: ? Bleeding from an injury or your nose that does not stop. ? Unusual colored urine (red or dark brown) or unusual colored stools (red or black). ? Unusual bruising for unknown reasons. ? A serious fall or if you hit your head (even if there is no bleeding).  Some medicines may interact with Eliquis and might increase your risk of bleeding or clotting while on Eliquis. To help avoid this, consult your healthcare provider or pharmacist prior to using any new prescription or non-prescription medications, including herbals, vitamins, non-steroidal anti-inflammatory drugs (NSAIDs) and supplements.  This website has more information on Eliquis (apixaban): http://www.eliquis.com/eliquis/home

## 2015-07-07 NOTE — Progress Notes (Signed)
Occupational Therapy Evaluation Patient Details Name: Diane [name] MRN: [id number] DOB: February 16, 1951 Today's Date: 07/07/2015    History of Present Illness Lt TKA   Clinical Impression   Pt s/p left TKA. She is independent at baseline.  Currently requiring max assist for LB ADLs and min assist for functional mobility. Patient will benefit from continued OT services to address below problem list and maximize independence and safety with ADLs to ensure safe return home with spouse.     Follow Up Recommendations  No OT follow up;Supervision/Assistance - 24 hour    Equipment Recommendations  None recommended by OT    Recommendations for Other Services       Precautions / Restrictions Precautions Precautions: Fall;Knee Required Braces or Orthoses: Knee Immobilizer - Left Knee Immobilizer - Left: On when out of bed or walking Restrictions Weight Bearing Restrictions: Yes LLE Weight Bearing: Weight bearing as tolerated      Mobility Bed Mobility Overal bed mobility: Needs Assistance Bed Mobility: Supine to Sit     Supine to sit: Min assist     General bed mobility comments: Assist provided to support left LE.  Transfers Overall transfer level: Needs assistance Equipment used: Rolling walker (2 wheeled) Transfers: Sit to/from Stand Sit to Stand: Min assist         General transfer comment: Verbal cues for safe hand placment and sequencing.    Balance                                            ADL Overall ADL's : Needs assistance/impaired     Grooming: Brushing hair;Set up;Sitting           Upper Body Dressing : Sitting;Set up   Lower Body Dressing: Maximal assistance;Sit to/from stand   Toilet Transfer: Stand-pivot;Minimal assistance (simulated bed to chair)   Toileting- Clothing Manipulation and Hygiene: Minimal assistance;Sit to/from stand Toileting - Clothing Manipulation Details (indicate cue type and reason): simulated with  reaching while in standing     Functional mobility during ADLs: Minimal assistance;Rolling walker General ADL Comments: Pt sat EOB for ~10 minutes.  Unable to reach left foot due to decreased ROM and increased pain.     Vision     Perception     Praxis      Pertinent Vitals/Pain Pain Assessment: 0-10 Pain Score: 6  Pain Location: left knee Pain Descriptors / Indicators: Aching;Constant Pain Intervention(s): Premedicated before session;Repositioned     Hand Dominance     Extremity/Trunk Assessment Upper Extremity Assessment Upper Extremity Assessment: Overall WFL for tasks assessed   Lower Extremity Assessment Lower Extremity Assessment: Defer to PT evaluation       Communication Communication Communication: No difficulties   Cognition Arousal/Alertness: Awake/alert Behavior During Therapy: WFL for tasks assessed/performed Overall Cognitive Status: Within Functional Limits for tasks assessed                     General Comments       Exercises       Shoulder Instructions      Home Living Family/patient expects to be discharged to:: Private residence Living Arrangements: Spouse/significant other Available Help at Discharge: Family;Available 24 hours/day Type of Home: House Home Access: Stairs to enter [employer] of Steps: 4  Entrance Stairs-Rails: Can reach both Home Layout: One level     Bathroom Shower/Tub: Walk-in shower  Bathroom Toilet: Standard     Home Equipment: Environmental consultant - 2 wheels;Bedside commode;Shower seat;Hand held shower head;Adaptive equipment Adaptive Equipment: Reacher        Prior Functioning/Environment Level of Independence: Independent             OT Diagnosis: Generalized weakness;Acute pain   OT Problem List: Decreased strength;Decreased activity tolerance;Impaired balance (sitting and/or standing);Decreased knowledge of use of DME or AE;Pain   OT Treatment/Interventions: Self-care/ADL  training;DME and/or AE instruction;Therapeutic activities;Patient/family education    OT Goals(Current goals can be found in the care plan section) Acute Rehab OT Goals Patient Stated Goal: to be independent OT Goal Formulation: With patient Time For Goal Achievement: 07/14/15 Potential to Achieve Goals: Good  OT Frequency: Min 2X/week   Barriers to D/C:            Co-evaluation              End of Session Equipment Utilized During Treatment: Gait belt;Rolling walker;Left knee immobilizer Nurse Communication: Mobility status  Activity Tolerance: Patient limited by pain Patient left: in chair;with call bell/phone within reach   Time: 1100-1136 OT Time Calculation (min): 36 min Charges:  OT General Charges $OT Visit: 1 Procedure OT Evaluation $Initial OT Evaluation Tier I: 1 Procedure OT Treatments $Self Care/Home Management : 8-22 mins G-Codes:   08/04/15 Darrol Jump OTR/L  Office [phone]

## 2015-07-07 NOTE — Progress Notes (Signed)
Physical Therapy Treatment Patient Details Name: Diane [name] MRN: [id number] DOB: [birth year] Today's Date: 07/07/2015    History of Present Illness Lt TKA    PT Comments    Patient with increase in pain again, premedicated by nursing. Patient is motivated, wants to be independent.  Decreased knee ROM, antalgia with all mobility, assist for transfers still, but MIN assist overall.  Stair training initiated, need to assess for 4 steps to enter home still.  Patient is appropriate to continue with skilled PT services.   Follow Up Recommendations  Home health PT     Equipment Recommendations  None recommended by PT    Recommendations for Other Services       Precautions / Restrictions Precautions Precautions: Fall;Knee Required Braces or Orthoses: Knee Immobilizer - Left Knee Immobilizer - Left: On when out of bed or walking Restrictions Weight Bearing Restrictions: Yes LLE Weight Bearing: Weight bearing as tolerated    Mobility  Bed Mobility                  Transfers Overall transfer level: Needs assistance Equipment used: Rolling walker (2 wheeled) Transfers: Sit to/from Stand Sit to Stand: Min guard;Min assist         General transfer comment: Verbal cues for safe hand placment and sequencing.  Ambulation/Gait Ambulation/Gait assistance: Min guard Ambulation Distance (Feet): 140 Feet Assistive device: Rolling walker (2 wheeled) Gait Pattern/deviations: Step-through pattern;Ataxic   Gait velocity interpretation: Below normal speed for age/gender     Stairs Stairs: Yes Stairs assistance: Min assist Stair Management: With walker Number of Stairs: 1 General stair comments: Curb step training  Wheelchair Mobility    Modified Rankin (Stroke Patients Only)       Balance     Sitting balance-Leahy Scale: Fair       Standing balance-Leahy Scale: Poor                      Cognition Arousal/Alertness: Awake/alert Behavior During  Therapy: WFL for tasks assessed/performed Overall Cognitive Status: Within Functional Limits for tasks assessed                      Exercises Total Joint Exercises Ankle Circles/Pumps: AROM;Both;10 reps;Seated Quad Sets: AROM;Left;5 reps;Seated Straight Leg Raises: AAROM;Both;10 reps;Seated Long Arc Quad: AAROM;5 reps;Seated;Left Knee Flexion: AROM;Left;5 reps;Seated Goniometric ROM: 10-65    General Comments        Pertinent Vitals/Pain Pain Assessment: 0-10 Pain Score: 7  Pain Location: Lt knee Pain Descriptors / Indicators: Aching;Sore Pain Intervention(s): Premedicated before session;Monitored during session    Home Living                      Prior Function            PT Goals (current goals can now be found in the care plan section) Acute Rehab PT Goals Patient Stated Goal: to be independent PT Goal Formulation: With patient Time For Goal Achievement: 07/20/15 Potential to Achieve Goals: Good Progress towards PT goals: Progressing toward goals    Frequency  7X/week    PT Plan Current plan remains appropriate    Co-evaluation             End of Session Equipment Utilized During Treatment: Gait belt;Left knee immobilizer Activity Tolerance: Patient tolerated treatment well Patient left: in chair;with call bell/phone within reach;with family/visitor present     Time: 8850-2774 PT Time Calculation (min) (ACUTE ONLY): 40 min  Charges:  $Gait Training: 8-22 mins $Therapeutic Exercise: 8-22 mins $Therapeutic Activity: 8-22 mins                    G Codes:      Yaser Harvill L 2015/07/27, 5:14 PM

## 2015-07-07 NOTE — Progress Notes (Signed)
Orthopedic Tech Progress Note Patient Details:  Diane [name] [birth year] [id number]  Patient ID: [name], female   DOB: 1951-08-02, 64 y.o.   MRN: [id number] Placed pt's lle on cpm @ 0-60 degrees @1410   Hildred Priest 07/07/2015, 2:08 PM

## 2015-07-07 NOTE — Progress Notes (Signed)
Charge nurse called into the room. Patient's husband concerned the patient waited hours for her pain medicine this morning. In the [facility], oxycodone 10mg  Q3hours was administered once at 1:40am with dilaudid and then again at 6:45am. Patient educated on PRN med protocol. She was made aware that the medicine is given by request and not scheduled for every 3 hours. Patient stated that she had asked the nurse for the pain medicine and it was a long time before she came with it. Patient says the night nurse was very apologetic when she came into the room. Stated that she had been busy and "forgot".  I assured the patient and her husband that we would try our best to get her pain managed throughout the remainder of her stay here. I administered one dose of dilaudid IV 1mg  and updated the board with the times for her next doses of pain medicine. Patient and husband very thankful and seem to be in a more pleasant mood. Will continue to monitor.

## 2015-07-07 NOTE — Progress Notes (Signed)
Orthopedic Tech Progress Note Patient Details:  Diane [name] 1951-04-26 [id number] On cpm at 7:50 pm Patient ID: Diane [name], female   DOB: 1951/04/14, 65 y.o.   MRN: [id number]   Diane [name] 07/07/2015, 7:52 PM

## 2015-07-07 NOTE — Progress Notes (Signed)
     Subjective:  POD#1 L TKA. Patient reports pain as mild to moderate.  Resting comfortably in a chair.  Tolerated PT this morning well.  Plan to work on getting pain well controlled today.  If PT continues without complications and pain well controled with d/c tomorrow.   Objective:   VITALS:   Filed Vitals:   07/06/15 2100 07/06/15 2131 07/07/15 0136 07/07/15 0514  BP:  108/53 103/52 125/49  Pulse:  107 85 81  Temp: 97.4 F (36.3 C) 97.4 F (36.3 C) 97.7 F (36.5 C) 98 F (36.7 C)  TempSrc:  Oral Oral Oral  Resp:  19 17 17   Height:      Weight:      SpO2:  98% 100%     Neurologically intact ABD soft Neurovascular intact Sensation intact distally Intact pulses distally Dorsiflexion/Plantar flexion intact Incision: dressing C/D/I   Lab Results  Component Value Date   WBC 9.0 07/07/2015   HGB 10.5* 07/07/2015   HCT 31.0* 07/07/2015   MCV 95.7 07/07/2015   PLT 166 07/07/2015   BMET    Component Value Date/Time   NA 131* 07/07/2015 0356   NA 134 07/24/2014 0952   K 3.9 07/07/2015 0356   CL 91* 07/07/2015 0356   CO2 32 07/07/2015 0356   GLUCOSE 123* 07/07/2015 0356   GLUCOSE 83 07/24/2014 0952   BUN 6 07/07/2015 0356   BUN 15 07/24/2014 0952   CREATININE 0.53 07/07/2015 0356   CALCIUM 8.6* 07/07/2015 0356   GFRNONAA >60 07/07/2015 0356   GFRAA >60 07/07/2015 0356     Assessment/Plan: 1 Day Post-Op   Active Problems:   Primary localized osteoarthritis of left knee   Up with therapy WBAT in the LLE Eliquis for DVT prophylaxis Plan to discharge home tomorrow if no complications overnight.    Diane [name] Lelan Pons 07/07/2015, 1:11 PM Cell [phone]

## 2015-07-08 LAB — CBC
HEMATOCRIT: 30.4 % — AB (ref 36.0–46.0)
Hemoglobin: 10.6 g/dL — ABNORMAL LOW (ref 12.0–15.0)
MCH: 33.4 pg (ref 26.0–34.0)
MCHC: 34.9 g/dL (ref 30.0–36.0)
MCV: 95.9 fL (ref 78.0–100.0)
Platelets: 160 10*3/uL (ref 150–400)
RBC: 3.17 MIL/uL — ABNORMAL LOW (ref 3.87–5.11)
RDW: 12 % (ref 11.5–15.5)
WBC: 10.6 10*3/uL — AB (ref 4.0–10.5)

## 2015-07-08 NOTE — Progress Notes (Signed)
Physical Therapy Treatment Patient Details Name: Diane [name] MRN: [id number] DOB: 04/05/1951 Today's Date: 07/08/2015    History of Present Illness Lt TKA    PT Comments    Pt safe for d/c home today from mobility standpoint. Stair training completed.  Follow Up Recommendations  Home health PT     Equipment Recommendations  None recommended by PT    Recommendations for Other Services       Precautions / Restrictions Precautions Precautions: Fall;Knee Required Braces or Orthoses: Knee Immobilizer - Left Knee Immobilizer - Left: On when out of bed or walking Restrictions LLE Weight Bearing: Weight bearing as tolerated    Mobility  Bed Mobility         Supine to sit: Min assist     General bed mobility comments: assist with LLE  Transfers Overall transfer level: Needs assistance Equipment used: Rolling walker (2 wheeled) Transfers: Sit to/from Stand Sit to Stand: Min guard         General transfer comment: Verbal cues for safe hand placment and sequencing.  Ambulation/Gait Ambulation/Gait assistance: Min guard Ambulation Distance (Feet): 75 Feet Assistive device: Rolling walker (2 wheeled) Gait Pattern/deviations: Step-through pattern;Antalgic   Gait velocity interpretation: Below normal speed for age/gender     Stairs   Stairs assistance: Min assist Stair Management: Two rails;Forwards Number of Stairs: 5 General stair comments: verbal cues for safety and sequencing  Wheelchair Mobility    Modified Rankin (Stroke Patients Only)       Balance                                    Cognition Arousal/Alertness: Awake/alert Behavior During Therapy: WFL for tasks assessed/performed Overall Cognitive Status: Within Functional Limits for tasks assessed                      Exercises Total Joint Exercises Ankle Circles/Pumps: AROM;Both;10 reps Quad Sets: AROM;Left;10 reps Heel Slides: AAROM;Left;10 reps Hip  ABduction/ADduction: AAROM;Left;10 reps    General Comments        Pertinent Vitals/Pain Pain Assessment:  (did not rate but c/o rn present giving meds upon arrival) Pain Score: 8  Pain Location: L knee Pain Descriptors / Indicators: Aching Pain Intervention(s): Monitored during session;Limited activity within patient's tolerance;Premedicated before session;RN gave pain meds during session    Home Living                      Prior Function            PT Goals (current goals can now be found in the care plan section) Acute Rehab PT Goals Patient Stated Goal: to be independent PT Goal Formulation: With patient Time For Goal Achievement: 07/20/15 Potential to Achieve Goals: Good Progress towards PT goals: Progressing toward goals    Frequency  7X/week    PT Plan Current plan remains appropriate    Co-evaluation             End of Session Equipment Utilized During Treatment: Gait belt;Left knee immobilizer Activity Tolerance: Patient tolerated treatment well Patient left: in chair;with call bell/phone within reach     Time: 0917-0942 PT Time Calculation (min) (ACUTE ONLY): 25 min  Charges:  $Gait Training: 8-22 mins $Therapeutic Exercise: 8-22 mins                    G Codes:  Lorriane Shire 07/08/2015, 1:18 PM

## 2015-07-08 NOTE — Progress Notes (Signed)
     Subjective:  POD#2 L TKA. Patient reports pain as mild to moderate.  Resting comfortably in the chair.  No issues overnight.   Objective:   VITALS:   Filed Vitals:   07/07/15 0514 07/07/15 1439 07/07/15 1943 07/08/15 0344  BP: 125/49 113/47 107/60 106/67  Pulse: 81 103 114 125  Temp: 98 F (36.7 C) 99.3 F (37.4 C) 100.1 F (37.8 C) 98.3 F (36.8 C)  TempSrc: Oral Oral Oral Oral  Resp: 17 16 17 16   Height:      Weight:      SpO2:  100% 97% 95%    Neurologically intact ABD soft Neurovascular intact Sensation intact distally Intact pulses distally Dorsiflexion/Plantar flexion intact Incision: dressing C/D/I   Lab Results  Component Value Date   WBC 10.6* 07/08/2015   HGB 10.6* 07/08/2015   HCT 30.4* 07/08/2015   MCV 95.9 07/08/2015   PLT 160 07/08/2015   BMET    Component Value Date/Time   NA 131* 07/07/2015 0356   NA 134 07/24/2014 0952   K 3.9 07/07/2015 0356   CL 91* 07/07/2015 0356   CO2 32 07/07/2015 0356   GLUCOSE 123* 07/07/2015 0356   GLUCOSE 83 07/24/2014 0952   BUN 6 07/07/2015 0356   BUN 15 07/24/2014 0952   CREATININE 0.53 07/07/2015 0356   CALCIUM 8.6* 07/07/2015 0356   GFRNONAA >60 07/07/2015 0356   GFRAA >60 07/07/2015 0356     Assessment/Plan: 2 Days Post-Op   Active Problems:   Primary localized osteoarthritis of left knee   Up with therapy WBAT in the LLE Eliquis for DVT prophylaxis Dressing changed today, drain pulled.  Plan for discharge home today.    Diane [name] Diane [name] 07/08/2015, 10:41 AM Cell [phone]

## 2015-07-08 NOTE — Discharge Summary (Signed)
Physician Discharge Summary  Patient ID: Diane [name] MRN: [id number] DOB/AGE: 1951/08/29 64 y.o.  Admit date: 07/06/2015 Discharge date: 07/08/2015  Admission Diagnoses:  Primary localized osteoarthritis of left knee  Discharge Diagnoses:  Principal Problem:   Primary localized osteoarthritis of left knee   Past Medical History  Diagnosis Date  . 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 Hypertension   . GERD (gastroesophageal reflux disease)   . Hyperlipidemia   . Ejection fraction   . CAD (coronary artery disease)   . Arthritis   . PONV (postoperative nausea and vomiting)   . 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 Depression   . Skin cancer of face 05/2015    basal cell  . Stroke 2006    deaf in right ear    Surgeries: Procedure(s): TOTAL KNEE ARTHROPLASTY on 07/06/2015   Consultants (if any):    Discharged Condition: Improved  Hospital Course: Diane [name] is an 64 y.o. female who was admitted 07/06/2015 with a diagnosis of Primary localized osteoarthritis of left knee and went to the operating room on 07/06/2015 and underwent the above named procedures.    She was given perioperative antibiotics:      Anti-infectives    Start     Dose/Rate Route Frequency Ordered Stop   07/06/15 1545  ceFAZolin (ANCEF) IVPB 2 g/50 mL premix     2 g 100 mL/hr over 30 Minutes Intravenous Every 6 hours 07/06/15 1521 07/06/15 2214   07/06/15 0845  ceFAZolin (ANCEF) 2-3 GM-% IVPB SOLR    Comments:  Leandrew Koyanagi   : cabinet override      07/06/15 0845 07/06/15 2059   07/06/15 0840  ceFAZolin (ANCEF) IVPB 2 g/50 mL premix  Status:  Discontinued     2 g 100 mL/hr over 30 Minutes Intravenous On call to O.R. 07/06/15 0840 07/06/15 1414    .  She was given sequential compression devices, early ambulation, and Eliquis 2.5 BID for DVT prophylaxis.  She benefited maximally from the hospital stay and there were no complications.     Recent vital signs:  Filed Vitals:   07/08/15 0344  BP: 106/67  Pulse: 125  Temp: 98.3 F (36.8 C)  Resp: 16    Recent laboratory studies:  Lab Results  Component Value Date   HGB 10.6* 07/08/2015   HGB 10.5* 07/07/2015   HGB 15.6* 06/25/2015   Lab Results  Component Value Date   WBC 10.6* 07/08/2015   PLT 160 07/08/2015   Lab Results  Component Value Date   INR 0.96 06/25/2015   Lab Results  Component Value Date   NA 131* 07/07/2015   K 3.9 07/07/2015   CL 91* 07/07/2015   CO2 32 07/07/2015   BUN 6 07/07/2015   CREATININE 0.53 07/07/2015   GLUCOSE 123* 07/07/2015    Discharge Medications:     Medication List    STOP taking these medications        aspirin 81 MG EC tablet     butalbital-acetaminophen-caffeine 50-325-40-30 MG per capsule  Commonly known as:  FIORICET WITH CODEINE     ESTROVEN PO      TAKE these medications        apixaban 2.5 MG Tabs tablet  Commonly known as:  ELIQUIS  Take 1 tablet (2.5 mg total) by mouth 2 (two) times daily.     benazepril-hydrochlorthiazide 20-25 MG per tablet  Commonly known as:  LOTENSIN HCT  Take 1 tablet by mouth  every day     cholecalciferol 1000 UNITS tablet  Commonly known as:  VITAMIN D  Take 1,000 Units by mouth daily.     diazepam 5 MG tablet  Commonly known as:  VALIUM  TAKE 1/2 TABLET TO 1 TABLET BY MOUTH EVERY 12 HOURS AS NEEDED FOR ANXIETY     esomeprazole 40 MG capsule  Commonly known as:  NEXIUM  Take 40 mg by mouth daily as needed.     mupirocin ointment 2 %  Commonly known as:  BACTROBAN  Place 1 application into the nose 2 (two) times daily.     oxyCODONE-acetaminophen 5-325 MG per tablet  Commonly known as:  ROXICET  Take 1-2 tablets by mouth every 4 (four) hours as needed for severe pain.     pravastatin 40 MG tablet  Commonly known as:  PRAVACHOL  Take 1 tablet by mouth  every day     sertraline 25 MG tablet  Commonly known as:  ZOLOFT  Take 1 tablet (25 mg total) by  mouth daily.        Diagnostic Studies: Dg Chest 2 View  06/25/2015   CLINICAL DATA:  Preoperative respiratory exam for knee arthroplasty.  EXAM: CHEST  2 VIEW  COMPARISON:  10/27/2013  FINDINGS: Heart size is normal. Mediastinal shadows are normal. The lungs are clear. No bronchial thickening. No infiltrate, mass, effusion or collapse. Pulmonary vascularity is normal. No bony abnormality.  IMPRESSION: Normal chest   Electronically Signed   By: Nelson Chimes M.D.   On: 06/25/2015 14:04    Disposition: 07-Left Against Medical Advice    Follow-up Information    Follow up with CAFFREY JR,W D, MD. Schedule an appointment as soon as possible for a visit in 2 weeks.   Specialty:  Orthopedic Surgery   Contact information:   [place] [address] [phone]       Follow up with [facility].   Contact information:   [place] [address] [phone]        Signed: Gae Dry 07/08/2015, 10:43 AM Cell [phone]

## 2015-07-08 NOTE — Progress Notes (Signed)
Occupational Therapy Treatment Patient Details Name: FINESSE FIELDER MRN: [id number] DOB: 07-Dec-1951 Today's Date: 07/08/2015    History of present illness Lt TKA   OT comments  Pt. Limited by L knee pain this session.  Attempted shower stall transfer, unable to complete safely.  Will continue to follow acutely and re-attempt next session.    Follow Up Recommendations  No OT follow up;Supervision/Assistance - 24 hour    Equipment Recommendations  None recommended by OT    Recommendations for Other Services      Precautions / Restrictions Precautions Precautions: Fall;Knee Required Braces or Orthoses: Knee Immobilizer - Left Knee Immobilizer - Left: On when out of bed or walking Restrictions LLE Weight Bearing: Weight bearing as tolerated       Mobility Bed Mobility               General bed mobility comments: in recliner upon arrival  Transfers Overall transfer level: Needs assistance   Transfers: Sit to/from Stand Sit to Stand: Min guard         General transfer comment: Verbal cues for safe hand placment and sequencing.    Balance                                   ADL Overall ADL's : Needs assistance/impaired                                 Tub/ Shower Transfer: Walk-in shower;Maximal Museum/gallery conservator Details (indicate cue type and reason): attempted sim. shower stall transfer, pt. unable to bear weight to lift b les over ledge, discussed sponge bathing initially until safely able          Vision                     Perception     Praxis      Cognition   Behavior During Therapy: [facility] for tasks assessed/performed Overall Cognitive Status: Within Functional Limits for tasks assessed                       Extremity/Trunk Assessment               Exercises     Shoulder Instructions       General Comments      Pertinent Vitals/ Pain       Pain Assessment:  (did not  rate but c/o rn present giving meds upon arrival) Pain Location: L knee Pain Descriptors / Indicators: Aching Pain Intervention(s): Monitored during session;Limited activity within patient's tolerance;Premedicated before session;RN gave pain meds during session  Home Living                                          Prior Functioning/Environment              Frequency Min 2X/week     Progress Toward Goals  OT Goals(current goals can now be found in the care plan section)  Progress towards OT goals: Progressing toward goals     Plan Discharge plan remains appropriate    Co-evaluation                 End of Session Equipment Utilized During Treatment:  Gait belt;Rolling walker   Activity Tolerance Patient limited by pain   Patient Left in chair;with call bell/phone within reach   Nurse Communication          Time: 1001-1012 OT Time Calculation (min): 11 min  Charges: OT General Charges $OT Visit: 1 Procedure OT Treatments $Self Care/Home Management : 8-22 mins  Janice Coffin, COTA/L 07/08/2015, 10:22 AM

## 2015-07-09 ENCOUNTER — Encounter (HOSPITAL_COMMUNITY): Payer: Self-pay | Admitting: Orthopedic Surgery

## 2015-07-09 NOTE — Progress Notes (Signed)
Late Entry: Spoke with Diane [name] at [place] to confirm pre-arranged HHPT services are set up for post discharge. Met with patient and husband at bedside to confirm [facility] , denies DME need, she admits to having equipment at home. No further CM needs identified. Patient being discharged home to family's care. Will be transported home with private vehicle.

## 2015-07-19 NOTE — Addendum Note (Signed)
Addendum  created 07/19/15 0713 by Kate Sable, MD   Modules edited: Anesthesia Blocks and Procedures, Clinical Notes   Clinical Notes:  File: [id number]

## 2015-07-26 ENCOUNTER — Other Ambulatory Visit: Payer: Self-pay | Admitting: Family Medicine

## 2015-07-26 ENCOUNTER — Telehealth: Payer: Self-pay | Admitting: Family Medicine

## 2015-07-26 ENCOUNTER — Encounter: Payer: 59 | Admitting: Family Medicine

## 2015-07-26 DIAGNOSIS — Z0289 Encounter for other administrative examinations: Secondary | ICD-10-CM

## 2015-07-26 NOTE — Telephone Encounter (Signed)
Patient did not come in for their appointment today for cpe.  Please let me know if patient needs to be contacted immediately for follow up or no follow up needed. °

## 2015-07-26 NOTE — Telephone Encounter (Signed)
Pt [place] her CPE today.

## 2015-07-27 NOTE — Telephone Encounter (Signed)
Rx called in to requested pharmacy 

## 2015-08-24 ENCOUNTER — Other Ambulatory Visit: Payer: Self-pay

## 2015-08-24 MED ORDER — BUTALBITAL-APAP-CAFF-COD 50-325-40-30 MG PO CAPS: ORAL_CAPSULE | ORAL | Status: DC

## 2015-08-24 NOTE — Telephone Encounter (Signed)
Pt called with status of refill; Waynetta CMA is calling to [facility] now. Reminded pt to give 24 - 48 hrs notice prior to needing refill; pt voiced understanding.

## 2015-08-24 NOTE — Telephone Encounter (Signed)
Rx called in to requested pharmacy 

## 2015-08-24 NOTE — Telephone Encounter (Signed)
Pt left v/m requesting refill butalbital-apap-caffeine for migraine h/a and sinus pain to [facility] [address] at [address]. pt last seen 07/22/14 for annual exam(no future appt scheduled) and rx last refilled # 30 on 06/19/15. Pt request cb when med called in.

## 2015-08-26 ENCOUNTER — Other Ambulatory Visit: Payer: Self-pay | Admitting: Family Medicine

## 2015-09-04 ENCOUNTER — Other Ambulatory Visit: Payer: Self-pay | Admitting: Family Medicine

## 2015-09-04 NOTE — Telephone Encounter (Signed)
Rx called in to requested pharmacy 

## 2015-09-04 NOTE — Telephone Encounter (Signed)
Pt [facility] 06/2015 CPE

## 2015-09-13 ENCOUNTER — Other Ambulatory Visit: Payer: Self-pay | Admitting: Family Medicine

## 2015-09-29 ENCOUNTER — Other Ambulatory Visit: Payer: Self-pay | Admitting: Family Medicine

## 2015-10-16 ENCOUNTER — Other Ambulatory Visit: Payer: Self-pay | Admitting: Family Medicine

## 2015-10-16 NOTE — Telephone Encounter (Signed)
Valium last refilled on 09/04/15 for #60 with 0 refills. Ok to refill?

## 2015-10-17 NOTE — Telephone Encounter (Signed)
Rx called in to requested pharmacy 

## 2015-11-01 ENCOUNTER — Ambulatory Visit (INDEPENDENT_AMBULATORY_CARE_PROVIDER_SITE_OTHER): Payer: 59 | Admitting: Family Medicine

## 2015-11-01 ENCOUNTER — Encounter: Payer: Self-pay | Admitting: Family Medicine

## 2015-11-01 VITALS — BP 116/68 | HR 97 | Temp 97.4°F | Ht 62.25 in | Wt 182.5 lb

## 2015-11-01 DIAGNOSIS — I25110 Atherosclerotic heart disease of native coronary artery with unstable angina pectoris: Secondary | ICD-10-CM | POA: Diagnosis not present

## 2015-11-01 DIAGNOSIS — Z1211 Encounter for screening for malignant neoplasm of colon: Secondary | ICD-10-CM

## 2015-11-01 DIAGNOSIS — E785 Hyperlipidemia, unspecified: Secondary | ICD-10-CM | POA: Diagnosis not present

## 2015-11-01 DIAGNOSIS — F411 Generalized anxiety disorder: Secondary | ICD-10-CM | POA: Diagnosis not present

## 2015-11-01 DIAGNOSIS — I10 Essential (primary) hypertension: Secondary | ICD-10-CM

## 2015-11-01 DIAGNOSIS — R300 Dysuria: Secondary | ICD-10-CM | POA: Insufficient documentation

## 2015-11-01 DIAGNOSIS — K219 Gastro-esophageal reflux disease without esophagitis: Secondary | ICD-10-CM

## 2015-11-01 DIAGNOSIS — Z0000 Encounter for general adult medical examination without abnormal findings: Secondary | ICD-10-CM

## 2015-11-01 MED ORDER — BENAZEPRIL-HYDROCHLOROTHIAZIDE 20-25 MG PO TABS: ORAL_TABLET | ORAL | Status: DC

## 2015-11-01 MED ORDER — CIPROFLOXACIN HCL 500 MG PO TABS: 500.0000 mg | ORAL_TABLET | Freq: Two times a day (BID) | ORAL | Status: DC

## 2015-11-01 MED ORDER — BUTALBITAL-APAP-CAFF-COD 50-325-40-30 MG PO CAPS: ORAL_CAPSULE | ORAL | Status: DC

## 2015-11-01 NOTE — Assessment & Plan Note (Signed)
Continue pravachol at current dose. Check labs today. 

## 2015-11-01 NOTE — Progress Notes (Signed)
Subjective:    Patient ID: Diane [name], female    DOB: 09-02-1951, 64 y.o.   MRN: [id number]  HPI  Very pleasant 64 yo G8P8 with h/o CVA, HTN, HLD, anxiety here for CPX and follow up of chronic medical conditions.  Also having dysuria today- suprapubic pressure, dysuria, increased urinary frequency for past 2-3 days.  No fever, no nausea.  Has had some intermittent back pain.  Refuses colonoscopy but does agree to IFOB-did mail in stool cards to [facility] but we never received results. No family h/o breast, uterine or cervical CA. LMP 5 years ago.  Last pap smear 04/15/13- done by me. Mammogram 08/22/14  S/p TKA 07/06/15.  Doing PT, going back to work on 11/09/15. Pleased with results.   Anxiety- in 02/2015- started on zoloft 25 mg daily, weaned off celexa and refilled valium to use prn severe anxiety.  Also referred for psychotherapy. She feels symptoms are controlled.  Denies any current symptoms of anxiety or depression.   Lab Results  Component Value Date   CREATININE 0.53 07/07/2015   H/o CVA- on ASA 81 mg daily. Residual right ear deafness otherwise no residual deficits.  HTN- Well controlled on benazapril_HCTZ. No HA, blurred vision, CP or SOB.  HLD- on pravachol 40 mg daily. Lab Results  Component Value Date   CHOL 257* 07/24/2014   HDL 74 07/24/2014   LDLCALC 137* 07/24/2014   TRIG 228* 07/24/2014   CHOLHDL 3.5 07/24/2014   Lab Results  Component Value Date   ALT 26 06/25/2015   AST 25 06/25/2015   ALKPHOS 70 06/25/2015   BILITOT 0.5 06/25/2015      Works at [employer].  Very active.  GAD- on celexa 20 mg daily and worked well for years.  Recently has had more panic attacks- more stressors at work. No SI or HI and denies feeling depressed.  No SI or HI. She did see Dr. Damita Dunnings while I was out on maternity leave on 11/03/13 for worsening anxiety- note reviewed.  Advised to continue celexa and given rx for prn valium.  She would like it refilled to help  with panic attacks. Patient Active Problem List   Diagnosis Date Noted  . Primary localized osteoarthritis of left knee 07/06/2015  . Hyperlipidemia   . Ejection fraction   . CAD (coronary artery disease)   . History of CVA (cerebrovascular accident) 10/28/2013  . Dyslipidemia 10/28/2013  . Dyspnea on exertion 10/28/2013  . Generalized anxiety disorder 04/11/2013  . Routine general medical examination at a health care facility 04/11/2013  . GERD (gastroesophageal reflux disease) 02/02/2013  . Hypertension 02/02/2013   Past Medical History  Diagnosis Date  . 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 Hypertension   . GERD (gastroesophageal reflux disease)   . Hyperlipidemia   . Ejection fraction   . CAD (coronary artery disease)   . Arthritis   . PONV (postoperative nausea and vomiting)   . 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 Depression   . Skin cancer of face 05/2015    basal cell  . Stroke [facility]) 2006    deaf in right ear   Past Surgical History  Procedure Laterality Date  . Cesarean section    . Left heart catheterization with coronary angiogram N/A 10/28/2013    Procedure: LEFT HEART CATHETERIZATION WITH CORONARY ANGIOGRAM;  Surgeon: Larey Dresser, MD;  Location: [facility] CATH  LAB;  Service: Cardiovascular;  Laterality: N/A;  . Tubal ligation    . Cholecystectomy  1990  . Knee arthroscopy with medial menisectomy Left 01/05/2015    Procedure: LEFT KNEE ARTHROSCOPY WITH MEDIAL AND LATERAL MENISECTOMY; DEBRIDEMENT LATERAL PATELLA FEMORAL ;  Surgeon: Yvette Rack., MD;  Location: [place];  Service: Orthopedics;  Laterality: Left;  . Chondroplasty Left 01/05/2015    Procedure: CHONDROPLASTY;  Surgeon: Yvette Rack., MD;  Location: [place];  Service: Orthopedics;  Laterality: Left;  . Total knee arthroplasty Left 07/06/2015    Procedure: TOTAL KNEE ARTHROPLASTY;  Surgeon: Earlie Server, MD;  Location: [place];  Service: Orthopedics;  Laterality: Left;   Social History  Substance Use Topics  . Smoking status: Never Smoker   . Smokeless tobacco: Never Used     Comment: LIVES WITH 2 SMOKERS   . Alcohol Use: Yes     Comment: Rare social ETOH   Family History  Problem Relation Age of Onset  . Stroke Father   . Heart failure Father    No Known Allergies Current Outpatient Prescriptions on File Prior to Visit  Medication Sig Dispense Refill  . cholecalciferol (VITAMIN D) 1000 UNITS tablet Take 1,000 Units by mouth daily.    . diazepam (VALIUM) 5 MG tablet TAKE 1/2 TO 1 TABLET BY MOUTH EVERY 12 HOURS AS NEEDED FOR ANXIETY 60 tablet 0  . esomeprazole (NEXIUM) 40 MG capsule Take 40 mg by mouth daily as needed.     . mupirocin ointment (BACTROBAN) 2 % Place 1 application into the nose 2 (two) times daily.    . pravastatin (PRAVACHOL) 40 MG tablet Take 1 tablet by mouth  every day 90 tablet 3  . sertraline (ZOLOFT) 25 MG tablet Take 1 tablet by mouth  daily 90 tablet 1   No current facility-administered medications on file prior to visit.   The PMH, PSH, Social History, Family History, Medications, and allergies have been reviewed in [facility], and have been updated if relevant.   Review of Systems  Constitutional: Negative.   Respiratory: Negative.   Cardiovascular: Negative.   Gastrointestinal: Negative.   Endocrine: Negative.   Genitourinary: Positive for dysuria, frequency, hematuria and flank pain.  Musculoskeletal: Negative.   Skin: Negative.   Neurological: Negative.   Hematological: Negative.   Psychiatric/Behavioral: Negative.   All other systems reviewed and are negative.       Objective:   Physical Exam BP 116/68 mmHg  Pulse 97  Temp(Src) 97.4 F (36.3 C) (Oral)  Ht 5' 2.25" (1.581 m)  Wt 182 lb 8 oz (82.781 kg)  BMI 33.12 kg/m2  SpO2 95%   General:  Well-developed,well-nourished,in no acute distress; alert,appropriate and cooperative throughout examination Head:   normocephalic and atraumatic.   Eyes:  vision grossly intact, pupils equal, pupils round, and pupils reactive to light.   Ears:  R ear normal and L ear normal.   Nose:  no external deformity.   Mouth:  good dentition.   Neck:  No deformities, masses, or tenderness noted. Breasts:  No mass, nodules, thickening, tenderness, bulging, retraction, inflamation, nipple discharge or skin changes noted.   Lungs:  Normal respiratory effort, chest expands symmetrically. Lungs are clear to auscultation, no crackles or wheezes. Heart:  Normal rate and regular rhythm. S1 and S2 normal without gallop, murmur, click, rub or other extra sounds. Abdomen:  Bowel sounds positive,abdomen soft and non-tender without masses, organomegaly or hernias noted. Rectal:  no  external abnormalities.   Genitalia:  Pelvic Exam:        External: normal female genitalia without lesions or masses        Vagina: normal without lesions or masses        Cervix: normal without lesions or masses        Adnexa: normal bimanual exam without masses or fullness        Uterus: normal by palpation        Pap smear: performed Msk:  No deformity or scoliosis noted of thoracic or lumbar spine.   Extremities:  No clubbing, cyanosis, edema, or deformity noted with normal full range of motion of all joints.   Neurologic:  alert & oriented X3 and gait normal.   Skin:  Intact without suspicious lesions or rashes Cervical Nodes:  No lymphadenopathy noted Axillary Nodes:  No palpable lymphadenopathy Psych:  Cognition and judgment appear intact. Alert and cooperative with normal attention span and concentration. No apparent delusions, illusions, hallucinations     Assessment & Plan:

## 2015-11-01 NOTE — Assessment & Plan Note (Signed)
Well controlled. No changes made to current rxs.  

## 2015-11-01 NOTE — Assessment & Plan Note (Signed)
Reviewed preventive care protocols, scheduled due services, and updated immunizations Discussed nutrition, exercise, diet, and healthy lifestyle.  Pap smear today.  Has already had influenza vaccine.  Orders Placed This Encounter  Procedures  . Fecal occult blood, imunochemical  . Urine culture  . CBC with Differential/Platelet  . Comprehensive metabolic panel  . Lipid panel  . TSH  . HIV antibody (with reflex)  . Hepatitis C Antibody

## 2015-11-01 NOTE — Assessment & Plan Note (Signed)
Stable on current rxs. No changes made. 

## 2015-11-01 NOTE — Addendum Note (Signed)
Addended by: Ellamae Sia on: 11/01/2015 10:40 AM   Modules accepted: Orders

## 2015-11-01 NOTE — Progress Notes (Signed)
Pre visit review using our clinic review tool, if applicable. No additional management support is needed unless otherwise documented below in the visit note. 

## 2015-11-01 NOTE — Assessment & Plan Note (Signed)
New- symptoms classic for UTI. Cannot read urine dip- pt took azo. Start cipro 500 mg twice daily x 3 days and send urine for cx.

## 2015-11-02 ENCOUNTER — Telehealth: Payer: Self-pay

## 2015-11-02 LAB — COMPREHENSIVE METABOLIC PANEL
A/G RATIO: 1.7 (ref 1.1–2.5)
ALK PHOS: 86 IU/L (ref 39–117)
ALT: 33 IU/L — AB (ref 0–32)
AST: 20 IU/L (ref 0–40)
Albumin: 4.7 g/dL (ref 3.6–4.8)
BILIRUBIN TOTAL: 0.7 mg/dL (ref 0.0–1.2)
BUN/Creatinine Ratio: 11 (ref 11–26)
BUN: 8 mg/dL (ref 8–27)
CALCIUM: 9.8 mg/dL (ref 8.7–10.3)
CHLORIDE: 95 mmol/L — AB (ref 97–106)
CO2: 27 mmol/L (ref 18–29)
Creatinine, Ser: 0.72 mg/dL (ref 0.57–1.00)
GFR calc Af Amer: 103 mL/min/{1.73_m2} (ref >59)
GFR calc non Af Amer: 89 mL/min/{1.73_m2} (ref >59)
Globulin, Total: 2.7 g/dL (ref 1.5–4.5)
Glucose: 89 mg/dL (ref 65–99)
POTASSIUM: 4.1 mmol/L (ref 3.5–5.2)
Sodium: 140 mmol/L (ref 136–144)
Total Protein: 7.4 g/dL (ref 6.0–8.5)

## 2015-11-02 LAB — TSH: TSH: 2.1 u[IU]/mL (ref 0.450–4.500)

## 2015-11-02 LAB — CBC WITH DIFFERENTIAL/PLATELET
BASOS: 0 %
Basophils Absolute: 0 10*3/uL (ref 0.0–0.2)
EOS (ABSOLUTE): 0.2 10*3/uL (ref 0.0–0.4)
Eos: 1 %
Hematocrit: 41.8 % (ref 34.0–46.6)
Hemoglobin: 14.9 g/dL (ref 11.1–15.9)
IMMATURE GRANULOCYTES: 0 %
Immature Grans (Abs): 0 10*3/uL (ref 0.0–0.1)
Lymphocytes Absolute: 1.4 10*3/uL (ref 0.7–3.1)
Lymphs: 10 %
MCH: 32.2 pg (ref 26.6–33.0)
MCHC: 35.6 g/dL (ref 31.5–35.7)
MCV: 90 fL (ref 79–97)
MONOS ABS: 0.7 10*3/uL (ref 0.1–0.9)
Monocytes: 5 %
NEUTROS PCT: 84 %
Neutrophils Absolute: 11.6 10*3/uL — ABNORMAL HIGH (ref 1.4–7.0)
PLATELETS: 215 10*3/uL (ref 150–379)
RBC: 4.63 x10E6/uL (ref 3.77–5.28)
RDW: 14.4 % (ref 12.3–15.4)
WBC: 13.9 10*3/uL — ABNORMAL HIGH (ref 3.4–10.8)

## 2015-11-02 LAB — LIPID PANEL
Chol/HDL Ratio: 4.6 ratio units — ABNORMAL HIGH (ref 0.0–4.4)
Cholesterol, Total: 306 mg/dL — ABNORMAL HIGH (ref 100–199)
HDL: 67 mg/dL (ref >39)
LDL Calculated: 183 mg/dL — ABNORMAL HIGH (ref 0–99)
TRIGLYCERIDES: 280 mg/dL — AB (ref 0–149)
VLDL Cholesterol Cal: 56 mg/dL — ABNORMAL HIGH (ref 5–40)

## 2015-11-02 LAB — HEPATITIS C ANTIBODY

## 2015-11-02 LAB — HIV ANTIBODY (ROUTINE TESTING W REFLEX): HIV Screen 4th Generation wRfx: NONREACTIVE

## 2015-11-02 NOTE — Telephone Encounter (Signed)
Diane [name] with [facility] left v/m requesting cb with called lab results; I spoke with Diane [name]; called report was the collection was showing 10/21 AM and was not picked up by courier last night (11/01/15); Terri in [facility] lab requested [facility] to pick up as stat lab this morning (11/02/15); stat labs are called to office. Spoke with [name] RN team lead; from 11/01/15 office note does not appear [name] requested called report of stat lab and when reviewed lab testing that is back so far no critical values seen. Will wait for other lab values to be sent. To [name] as Juluis Rainier.

## 2015-11-04 LAB — URINE CULTURE

## 2015-11-05 LAB — PAP LB (LIQUID-BASED): PAP Smear Comment: 0

## 2015-11-06 ENCOUNTER — Other Ambulatory Visit: Payer: Self-pay | Admitting: Family Medicine

## 2015-11-06 DIAGNOSIS — Z1231 Encounter for screening mammogram for malignant neoplasm of breast: Secondary | ICD-10-CM

## 2015-11-12 ENCOUNTER — Telehealth: Payer: Self-pay

## 2015-11-12 MED ORDER — BENZONATATE 100 MG PO CAPS: 100.0000 mg | ORAL_CAPSULE | Freq: Two times a day (BID) | ORAL | Status: DC | PRN

## 2015-11-12 NOTE — Telephone Encounter (Addendum)
Pt left v/m; pt has drainage at back of throat; pts daughter who lives with pt had some type of of URI. Pt has tried Delsym and Tussend without relief of cough. Left v/m requesting pt to cb; pt has non prod cough, worse at night, throat gets sore because coughing so much; no fever. Pt request cb.[facility].

## 2015-11-12 NOTE — Telephone Encounter (Signed)
eRx sent for tessalon.  Please keep [place] updated.

## 2015-11-14 ENCOUNTER — Ambulatory Visit
Admission: RE | Admit: 2015-11-14 | Discharge: 2015-11-14 | Disposition: A | Discharge status: Home / Self Care | Payer: 59 | Source: Ambulatory Visit | Attending: Family Medicine | Admitting: Family Medicine

## 2015-11-14 ENCOUNTER — Other Ambulatory Visit: Payer: Self-pay | Admitting: Family Medicine

## 2015-11-14 DIAGNOSIS — Z1231 Encounter for screening mammogram for malignant neoplasm of breast: Secondary | ICD-10-CM

## 2015-11-27 ENCOUNTER — Other Ambulatory Visit: Payer: Self-pay | Admitting: Family Medicine

## 2015-11-27 NOTE — Telephone Encounter (Signed)
Rx called in to requested pharmacy 

## 2015-11-27 NOTE — Telephone Encounter (Signed)
Last f/u 10/2015-CPE

## 2015-12-03 DIAGNOSIS — I2510 Atherosclerotic heart disease of native coronary artery without angina pectoris: Secondary | ICD-10-CM | POA: Insufficient documentation

## 2015-12-03 DIAGNOSIS — R0989 Other specified symptoms and signs involving the circulatory and respiratory systems: Secondary | ICD-10-CM | POA: Insufficient documentation

## 2015-12-06 ENCOUNTER — Other Ambulatory Visit: Payer: Self-pay | Admitting: Family Medicine

## 2015-12-06 NOTE — Telephone Encounter (Signed)
Rx faxed to requested pharmacy 

## 2015-12-06 NOTE — Telephone Encounter (Signed)
Last f/u 10/2015-CPE

## 2015-12-26 ENCOUNTER — Other Ambulatory Visit: Payer: Self-pay | Admitting: Family Medicine

## 2015-12-27 ENCOUNTER — Other Ambulatory Visit: Payer: Self-pay

## 2015-12-27 MED ORDER — BUTALBITAL-APAP-CAFF-COD 50-325-40-30 MG PO CAPS: ORAL_CAPSULE | ORAL | Status: DC

## 2015-12-27 NOTE — Telephone Encounter (Signed)
Rx faxed to pharmacy  

## 2015-12-27 NOTE — Telephone Encounter (Signed)
RX printed and signed, given to MYD

## 2015-12-27 NOTE — Telephone Encounter (Signed)
Pt request refill butalbital,acetaminophen-caffeine to [facility] [address]. Last refilled # 30 on 12/06/15. Last annual 11/01/15.

## 2016-01-07 ENCOUNTER — Other Ambulatory Visit: Payer: Self-pay | Admitting: Family Medicine

## 2016-01-08 ENCOUNTER — Telehealth: Payer: Self-pay

## 2016-01-08 MED ORDER — PRAVASTATIN SODIUM 40 MG PO TABS: ORAL_TABLET | ORAL | Status: DC

## 2016-01-08 NOTE — Telephone Encounter (Signed)
Pt request refill pravastatin to [facility] [address]. Last annual 11/01/15. Refill done per protocol.

## 2016-01-08 NOTE — Telephone Encounter (Signed)
Received refill request electronically Last refill 11/27/15 #60 Last office visit 11/01/15 Is it okay to refill?

## 2016-01-08 NOTE — Telephone Encounter (Signed)
Rx called to pharmacy as instructed. 

## 2016-01-17 ENCOUNTER — Encounter: Payer: Self-pay | Admitting: *Deleted

## 2016-01-21 ENCOUNTER — Other Ambulatory Visit: Payer: Self-pay

## 2016-01-21 MED ORDER — BUTALBITAL-APAP-CAFF-COD 50-325-40-30 MG PO CAPS: ORAL_CAPSULE | ORAL | Status: DC

## 2016-01-21 NOTE — Telephone Encounter (Signed)
Pt left v/m requesting 30 day refill for migraine medicine in stead of 15 day refill. Last refilled # 30 on 12/27/15. [place] [facility] [place]. Last seen 11/01/15. Pt request cb.

## 2016-01-21 NOTE — Telephone Encounter (Signed)
Rx called in to requested pharmacy 

## 2016-01-24 ENCOUNTER — Other Ambulatory Visit: Payer: Self-pay | Admitting: Family Medicine

## 2016-01-24 MED ORDER — EZETIMIBE 10 MG PO TABS: 10.0000 mg | ORAL_TABLET | Freq: Every day | ORAL | Status: DC

## 2016-02-11 ENCOUNTER — Other Ambulatory Visit: Payer: Self-pay | Admitting: Family Medicine

## 2016-02-11 NOTE — Telephone Encounter (Signed)
Rx called to pharmacy as instructed. 

## 2016-02-11 NOTE — Telephone Encounter (Signed)
Received refill request electronically Last refill 01/08/16 #60 Last office visit 11/01/15 Is it okay to refill?

## 2016-02-20 ENCOUNTER — Other Ambulatory Visit: Payer: Self-pay | Admitting: Family Medicine

## 2016-02-21 NOTE — Telephone Encounter (Signed)
Rx called in to requested pharmacy 

## 2016-02-21 NOTE — Telephone Encounter (Signed)
Last f/u 10/2015-CPE

## 2016-02-25 ENCOUNTER — Telehealth: Payer: Self-pay

## 2016-02-25 NOTE — Telephone Encounter (Signed)
Spoke to pt and scheduled OV. States she will have labs done at that time

## 2016-02-25 NOTE — Telephone Encounter (Signed)
I'm ok with her scheduling a lab appointment only but she does need to be seen to address her other concerns.

## 2016-02-25 NOTE — Telephone Encounter (Signed)
Pt left v/m; pt had knee replacement in July and pt contacted ortho but pt was advised to contact PCP so lab testing to monitor liver enzymes could be done; pt working 10 - 12 hour shifts and pt has arthritis pain, fingers are sore and difficult to grip spoon, pain in other joints. Pt wants to know if she will need to schedule appt to be seen or can pt be scheduled lab appt to monitor liver test and also to ck lipid; pt requesting med for arthritis pain to [facility] [address]. Pt request cb.last annual exam 11/01/15.

## 2016-02-26 ENCOUNTER — Encounter: Payer: Self-pay | Admitting: *Deleted

## 2016-02-26 ENCOUNTER — Encounter: Payer: Self-pay | Admitting: Family Medicine

## 2016-02-26 ENCOUNTER — Ambulatory Visit (INDEPENDENT_AMBULATORY_CARE_PROVIDER_SITE_OTHER): Payer: 59 | Admitting: Family Medicine

## 2016-02-26 VITALS — BP 128/82 | HR 86 | Temp 97.6°F | Wt 186.2 lb

## 2016-02-26 DIAGNOSIS — E785 Hyperlipidemia, unspecified: Secondary | ICD-10-CM

## 2016-02-26 DIAGNOSIS — M19041 Primary osteoarthritis, right hand: Secondary | ICD-10-CM | POA: Insufficient documentation

## 2016-02-26 DIAGNOSIS — M1990 Unspecified osteoarthritis, unspecified site: Secondary | ICD-10-CM | POA: Diagnosis not present

## 2016-02-26 DIAGNOSIS — M19042 Primary osteoarthritis, left hand: Secondary | ICD-10-CM

## 2016-02-26 NOTE — Progress Notes (Signed)
Subjective:   Patient ID: Diane [name], female    DOB: 07-03-1951, 65 y.o.   MRN: [id number]  Diane [name] is a pleasant 65 y.o. year old female who presents to clinic today with Follow-up  on 02/26/2016  HPI:  OA-  Known OA of back, knees, hips. s/p left TKA in 06/2015- Dr. French Ana. Working 10-12 hour shifts on her feet and now "everything hurts." Not taking anything for it.  NSAIDs upset her stomach.  HLD- added zetia 10 mg daily to pravachol 40 mg daily last month as lipids were not well controlled. She has been compliant with this.  Denies side effects. Lab Results  Component Value Date   CHOL 306* 11/01/2015   HDL 67 11/01/2015   LDLCALC 183* 11/01/2015   TRIG 280* 11/01/2015   CHOLHDL 4.6* 11/01/2015   Lab Results  Component Value Date   ALT 33* 11/01/2015   AST 20 11/01/2015   ALKPHOS 86 11/01/2015   BILITOT 0.7 11/01/2015     Current Outpatient Prescriptions on File Prior to Visit  Medication Sig Dispense Refill  . benazepril-hydrochlorthiazide (LOTENSIN HCT) 20-25 MG tablet Take 1 tablet by mouth  every day 90 tablet 3  . butalbital-acetaminophen-caffeine (FIORICET WITH CODEINE) 50-325-40-30 MG capsule TAKE 1 TO 2 CAPSULES BY MOUTH EVERY DAY AS NEEDED FOR PAIN 30 capsule 0  . cholecalciferol (VITAMIN D) 1000 UNITS tablet Take 1,000 Units by mouth daily.    . diazepam (VALIUM) 5 MG tablet TAKE 1/2 TO 1 TABLET BY MOUTH EVERY 12 HOURS AS NEEDED FOR ANXIETY 60 tablet 0  . esomeprazole (NEXIUM) 40 MG capsule Take 40 mg by mouth daily as needed.     . ezetimibe (ZETIA) 10 MG tablet Take 1 tablet (10 mg total) by mouth daily. 90 tablet 3  . mupirocin ointment (BACTROBAN) 2 % Place 1 application into the nose 2 (two) times daily.    . pravastatin (PRAVACHOL) 40 MG tablet Take 1 tablet by mouth  every day 90 tablet 3  . sertraline (ZOLOFT) 25 MG tablet Take 1 tablet by mouth  daily 90 tablet 1   No current facility-administered medications on file prior to  visit.    No Known Allergies  Past Medical History  Diagnosis Date  . 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 Hypertension   . GERD (gastroesophageal reflux disease)   . Hyperlipidemia   . Ejection fraction   . CAD (coronary artery disease)   . Arthritis   . PONV (postoperative nausea and vomiting)   . 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 Depression   . Skin cancer of face 05/2015    basal cell  . Stroke [facility]) 2006    deaf in right ear    Past Surgical History  Procedure Laterality Date  . Cesarean section    . Left heart catheterization with coronary angiogram N/A 10/28/2013    Procedure: LEFT HEART CATHETERIZATION WITH CORONARY ANGIOGRAM;  Surgeon: Larey Dresser, MD;  Location: [facility] CATH LAB;  Service: Cardiovascular;  Laterality: N/A;  . Tubal ligation    . Cholecystectomy  1990  . Knee arthroscopy with medial menisectomy Left 01/05/2015    Procedure: LEFT KNEE ARTHROSCOPY WITH MEDIAL AND LATERAL MENISECTOMY; DEBRIDEMENT LATERAL PATELLA FEMORAL ;  Surgeon: Yvette Rack., MD;  Location: [place];  Service: Orthopedics;  Laterality: Left;  . Chondroplasty Left 01/05/2015  Procedure: CHONDROPLASTY;  Surgeon: Yvette Rack., MD;  Location: [place];  Service: Orthopedics;  Laterality: Left;  . Total knee arthroplasty Left 07/06/2015    Procedure: TOTAL KNEE ARTHROPLASTY;  Surgeon: Earlie Server, MD;  Location: [place];  Service: Orthopedics;  Laterality: Left;  . Breast biopsy Left 1984    abcess    Family History  Problem Relation Age of Onset  . Stroke Father   . Heart failure Father     Social History   Social History  . Marital Status: Divorced    Spouse Name: [name]  . Number of Children: 8  . Years of Education: N/A   Occupational History  . Works full time at [employer] as [place]  . Smoking status: Never Smoker   . Smokeless tobacco:  Never Used     Comment: LIVES WITH 2 SMOKERS   . Alcohol Use: Yes     Comment: Rare social ETOH  . Drug Use: No  . Sexual Activity: Not on file   Other Topics Concern  . Not on file   Social History Narrative   Divorced, but has fiancee.  Lives with fiancee in [place].  Moved here recently from [place], [place].     She 8 children- ages 60- 39.               The PMH, PSH, Social History, Family History, Medications, and allergies have been reviewed in [facility], and have been updated if relevant.  Review of Systems  Respiratory: Negative.   Cardiovascular: Negative.   Gastrointestinal: Negative.   Genitourinary: Negative.   Musculoskeletal: Positive for back pain, joint swelling, arthralgias, neck pain and neck stiffness. Negative for myalgias and gait problem.  Skin: Negative.   Hematological: Negative.   Psychiatric/Behavioral: Negative.   All other systems reviewed and are negative.      Objective:    BP 128/82 mmHg  Pulse 86  Temp(Src) 97.6 F (36.4 C) (Oral)  Wt 186 lb 4 oz (84.482 kg)  SpO2 99%   Physical Exam  Constitutional: She is oriented to person, place, and time. She appears well-developed and well-nourished. No distress.  HENT:  Head: Normocephalic.  Eyes: Conjunctivae are normal.  Cardiovascular: Normal rate.   Pulmonary/Chest: Effort normal.  Musculoskeletal: Normal range of motion.  Neurological: She is alert and oriented to person, place, and time. No cranial nerve deficit.  Skin: Skin is warm and dry. She is not diaphoretic.  Psychiatric: She has a normal mood and affect. Her behavior is normal. Judgment and thought content normal.  Nursing note and vitals reviewed.         Assessment & Plan:   Osteoarthritis, unspecified osteoarthritis type, unspecified site  Hyperlipidemia - Plan: Lipid panel, Comprehensive metabolic panel No Follow-up on file.

## 2016-02-26 NOTE — Assessment & Plan Note (Signed)
Continue pravachol and zetia. Due for labs today.  Orders Placed This Encounter  Procedures  . Lipid panel  . Comprehensive metabolic panel

## 2016-02-26 NOTE — Assessment & Plan Note (Signed)
Deteriorated. Advised talking with her employer about her work conditions. Also schedule tylenol twice daily- see AVS.

## 2016-02-26 NOTE — Patient Instructions (Signed)
Great to see you. Please start taking Tylenol arthritis or extra strength- twice daily.  I will call you with your results from today.

## 2016-02-27 LAB — COMPREHENSIVE METABOLIC PANEL
ALBUMIN: 4.4 g/dL (ref 3.6–4.8)
ALT: 23 IU/L (ref 0–32)
AST: 22 IU/L (ref 0–40)
Albumin/Globulin Ratio: 1.5 (ref 1.1–2.5)
Alkaline Phosphatase: 63 IU/L (ref 39–117)
BUN/Creatinine Ratio: 30 — ABNORMAL HIGH (ref 11–26)
BUN: 19 mg/dL (ref 8–27)
Bilirubin Total: 0.2 mg/dL (ref 0.0–1.2)
CALCIUM: 9.4 mg/dL (ref 8.7–10.3)
CO2: 27 mmol/L (ref 18–29)
CREATININE: 0.63 mg/dL (ref 0.57–1.00)
Chloride: 99 mmol/L (ref 96–106)
GFR calc Af Amer: 110 mL/min/{1.73_m2} (ref >59)
GFR, EST NON AFRICAN AMERICAN: 96 mL/min/{1.73_m2} (ref >59)
GLOBULIN, TOTAL: 2.9 g/dL (ref 1.5–4.5)
Glucose: 97 mg/dL (ref 65–99)
Potassium: 4.9 mmol/L (ref 3.5–5.2)
SODIUM: 142 mmol/L (ref 134–144)
Total Protein: 7.3 g/dL (ref 6.0–8.5)

## 2016-02-27 LAB — LIPID PANEL
CHOLESTEROL TOTAL: 248 mg/dL — AB (ref 100–199)
Chol/HDL Ratio: 3.6 ratio units (ref 0.0–4.4)
HDL: 69 mg/dL (ref >39)
LDL CALC: 148 mg/dL — AB (ref 0–99)
Triglycerides: 154 mg/dL — ABNORMAL HIGH (ref 0–149)
VLDL Cholesterol Cal: 31 mg/dL (ref 5–40)

## 2016-03-10 ENCOUNTER — Other Ambulatory Visit: Payer: Self-pay | Admitting: Family Medicine

## 2016-03-11 ENCOUNTER — Other Ambulatory Visit: Payer: Self-pay | Admitting: Family Medicine

## 2016-03-11 NOTE — Telephone Encounter (Signed)
Keko w/[facility] would like the following prescriptions refilled for the patient:  1) Diazepam  2) Fioricet

## 2016-03-11 NOTE — Telephone Encounter (Signed)
Last f/u 01/2016 but anxiety not discussed. pls advise

## 2016-03-11 NOTE — Telephone Encounter (Signed)
Pt left v/m requesting status of refills for fioricet with codeine (last printed # 30 on 02/21/16)and valium(last printed # 60 on 03/11/16). Pt last seen 02/26/16.

## 2016-03-12 MED ORDER — BUTALBITAL-APAP-CAFF-COD 50-325-40-30 MG PO CAPS: ORAL_CAPSULE | ORAL | Status: DC

## 2016-03-12 NOTE — Telephone Encounter (Signed)
Rx called in to requested pharmacy 

## 2016-03-12 NOTE — Telephone Encounter (Signed)
Last f/u 01/2016. Spoke to pt who states that she is only been receiving a 15d supply. Pt states that she discussed this with detail with [name] at her last OV

## 2016-03-27 ENCOUNTER — Other Ambulatory Visit: Payer: Self-pay | Admitting: *Deleted

## 2016-03-27 MED ORDER — EZETIMIBE 10 MG PO TABS: 10.0000 mg | ORAL_TABLET | Freq: Every day | ORAL | Status: DC

## 2016-03-27 NOTE — Telephone Encounter (Signed)
Pt changed pharmacies and is requesting new Rx.Sent as requested

## 2016-04-09 ENCOUNTER — Ambulatory Visit: Payer: Self-pay | Admitting: Family Medicine

## 2016-04-09 DIAGNOSIS — Z0289 Encounter for other administrative examinations: Secondary | ICD-10-CM

## 2016-04-10 ENCOUNTER — Ambulatory Visit (INDEPENDENT_AMBULATORY_CARE_PROVIDER_SITE_OTHER): Payer: 59 | Admitting: Family Medicine

## 2016-04-10 ENCOUNTER — Encounter: Payer: Self-pay | Admitting: Family Medicine

## 2016-04-10 VITALS — BP 140/92 | HR 82 | Temp 97.7°F | Wt 190.0 lb

## 2016-04-10 DIAGNOSIS — R198 Other specified symptoms and signs involving the digestive system and abdomen: Secondary | ICD-10-CM

## 2016-04-10 NOTE — Progress Notes (Signed)
Subjective:   Patient ID: Diane [name], female    DOB: 06-14-1951, 65 y.o.   MRN: [id number]  Diane [name] is a pleasant 65 y.o. year old female who presents to clinic today with Abdominal Pain  on 04/10/2016  HPI:  2 weeks of abdominal bloating, belching, nausea and diarrhea. No vomiting. No sick contacts. No fevers or chills.  She started taking a probiotic two days ago and symptoms already improving a little but still feeling bloated.  No black stools. No blood in stool.  Has never had a colonoscopy.  Current Outpatient Prescriptions on File Prior to Visit  Medication Sig Dispense Refill  . benazepril-hydrochlorthiazide (LOTENSIN HCT) 20-25 MG tablet Take 1 tablet by mouth  every day 90 tablet 3  . butalbital-acetaminophen-caffeine (FIORICET WITH CODEINE) 50-325-40-30 MG capsule TAKE 1 TO 2 CAPSULES BY MOUTH EVERY DAY AS NEEDED FOR PAIN 30 capsule 0  . cholecalciferol (VITAMIN D) 1000 UNITS tablet Take 1,000 Units by mouth daily.    . diazepam (VALIUM) 5 MG tablet TAKE 1/2 TO 1 TABLET BY MOUTH EVERY 12 HOURS AS NEEDED FOR ANXIETY 60 tablet 0  . esomeprazole (NEXIUM) 40 MG capsule Take 40 mg by mouth daily as needed.     . ezetimibe (ZETIA) 10 MG tablet Take 1 tablet (10 mg total) by mouth daily. 90 tablet 2  . pravastatin (PRAVACHOL) 40 MG tablet Take 1 tablet by mouth  every day 90 tablet 3  . sertraline (ZOLOFT) 25 MG tablet Take 1 tablet by mouth  daily 90 tablet 1   No current facility-administered medications on file prior to visit.    No Known Allergies  Past Medical History  Diagnosis Date  . 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 Hypertension   . GERD (gastroesophageal reflux disease)   . Hyperlipidemia   . Ejection fraction   . CAD (coronary artery disease)   . Arthritis   . PONV (postoperative nausea and vomiting)   . 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 Depression   . Skin cancer of face 05/2015   basal cell  . Stroke [facility]) 2006    deaf in right ear    Past Surgical History  Procedure Laterality Date  . Cesarean section    . Left heart catheterization with coronary angiogram N/A 10/28/2013    Procedure: LEFT HEART CATHETERIZATION WITH CORONARY ANGIOGRAM;  Surgeon: Larey Dresser, MD;  Location: [facility] CATH LAB;  Service: Cardiovascular;  Laterality: N/A;  . Tubal ligation    . Cholecystectomy  1990  . Knee arthroscopy with medial menisectomy Left 01/05/2015    Procedure: LEFT KNEE ARTHROSCOPY WITH MEDIAL AND LATERAL MENISECTOMY; DEBRIDEMENT LATERAL PATELLA FEMORAL ;  Surgeon: Yvette Rack., MD;  Location: [place];  Service: Orthopedics;  Laterality: Left;  . Chondroplasty Left 01/05/2015    Procedure: CHONDROPLASTY;  Surgeon: Yvette Rack., MD;  Location: [place];  Service: Orthopedics;  Laterality: Left;  . Total knee arthroplasty Left 07/06/2015    Procedure: TOTAL KNEE ARTHROPLASTY;  Surgeon: Earlie Server, MD;  Location: [place];  Service: Orthopedics;  Laterality: Left;  . Breast biopsy Left 1984    abcess    Family History  Problem Relation Age of Onset  . Stroke Father   . Heart failure Father     Social History   Social History  . Marital Status: Divorced  Spouse Name: [name]  . Number of Children: 8  . Years of Education: N/A   Occupational History  . Works full time at [employer] as [place]  . Smoking status: Never Smoker   . Smokeless tobacco: Never Used     Comment: LIVES WITH 2 SMOKERS   . Alcohol Use: Yes     Comment: Rare social ETOH  . Drug Use: No  . Sexual Activity: Not on file   Other Topics Concern  . Not on file   Social History Narrative   Divorced, but has fiancee.  Lives with fiancee in [place].  Moved here recently from [place], [place].     She 8 children- ages 38- 62.               The PMH, PSH, Social History, Family History, Medications, and  allergies have been reviewed in [facility], and have been updated if relevant.   Review of Systems  Gastrointestinal: Positive for nausea, abdominal pain, diarrhea and abdominal distention. Negative for vomiting, constipation, blood in stool, anal bleeding and rectal pain.  All other systems reviewed and are negative.      Objective:    BP 140/92 mmHg  Pulse 82  Temp(Src) 97.7 F (36.5 C) (Oral)  Wt 190 lb (86.183 kg)  SpO2 98%   Physical Exam  Constitutional: She is oriented to person, place, and time. She appears well-developed and well-nourished. No distress.  HENT:  Head: Normocephalic.  Eyes: Conjunctivae are normal.  Cardiovascular: Normal rate.   Pulmonary/Chest: Effort normal.  Abdominal: Soft. Bowel sounds are normal. She exhibits no distension and no mass. There is no tenderness. There is no rebound and no guarding.  Musculoskeletal: Normal range of motion.  Neurological: She is alert and oriented to person, place, and time. No cranial nerve deficit.  Skin: Skin is warm and dry. She is not diaphoretic.  Psychiatric: She has a normal mood and affect. Her behavior is normal. Judgment and thought content normal.  Nursing note and vitals reviewed.         Assessment & Plan:   GI symptoms - Plan: H. pylori antibody, IgG, Lipase, Comprehensive metabolic panel No Follow-up on file.

## 2016-04-10 NOTE — Assessment & Plan Note (Signed)
New- ? h pylori vs acute gastroenteritis vs IBS Rule out H pylori today. Add PPI and continue probiotic. Also can use gas x as needed. If symptoms do not resolve, strongly encouraged colonoscopy

## 2016-04-10 NOTE — Patient Instructions (Signed)
Good to see you. Try over the counter gas-x (four times a day when necessary) or beano for bloating.   I will call you with your results.  Restart Nexium, continue probiotic.

## 2016-04-10 NOTE — Progress Notes (Signed)
Pre visit review using our clinic review tool, if applicable. No additional management support is needed unless otherwise documented below in the visit note. 

## 2016-04-11 LAB — COMPREHENSIVE METABOLIC PANEL
ALT: 23 IU/L (ref 0–32)
AST: 21 IU/L (ref 0–40)
Albumin/Globulin Ratio: 1.9 (ref 1.2–2.2)
Albumin: 4.8 g/dL (ref 3.6–4.8)
Alkaline Phosphatase: 65 IU/L (ref 39–117)
BUN/Creatinine Ratio: 19 (ref 12–28)
BUN: 13 mg/dL (ref 8–27)
Bilirubin Total: 0.5 mg/dL (ref 0.0–1.2)
CO2: 27 mmol/L (ref 18–29)
Calcium: 10 mg/dL (ref 8.7–10.3)
Chloride: 96 mmol/L (ref 96–106)
Creatinine, Ser: 0.67 mg/dL (ref 0.57–1.00)
GFR calc Af Amer: 108 mL/min/{1.73_m2} (ref >59)
GFR calc non Af Amer: 94 mL/min/{1.73_m2} (ref >59)
Globulin, Total: 2.5 g/dL (ref 1.5–4.5)
Glucose: 83 mg/dL (ref 65–99)
Potassium: 4.3 mmol/L (ref 3.5–5.2)
Sodium: 139 mmol/L (ref 134–144)
Total Protein: 7.3 g/dL (ref 6.0–8.5)

## 2016-04-11 LAB — H. PYLORI ANTIBODY, IGG: H Pylori IgG: <0.9 U/mL (ref 0.0–0.8)

## 2016-04-11 LAB — LIPASE: Lipase: 39 U/L (ref 0–59)

## 2016-04-16 ENCOUNTER — Other Ambulatory Visit: Payer: Self-pay | Admitting: Family Medicine

## 2016-04-17 ENCOUNTER — Other Ambulatory Visit: Payer: Self-pay | Admitting: Family Medicine

## 2016-04-17 NOTE — Telephone Encounter (Signed)
Last f/u 10/2015-CPE

## 2016-04-17 NOTE — Telephone Encounter (Signed)
Pt left /vm requesting cb about status of refills for diazepam and butalbital-apap-caffeine.

## 2016-04-18 NOTE — Telephone Encounter (Signed)
Rx called in to requested pharmacy 

## 2016-04-18 NOTE — Telephone Encounter (Signed)
Pt left v/m requesting status of diazepam and butalbital apap caffeine refills. Spoke with Mr Hipps since cannot reach pt (DPR signed) and advised that I spoke with Sarajane Jews at [facility] and he will ck his v/m and get med ready for pick up. Mrs Hipps voiced understanding.

## 2016-05-13 ENCOUNTER — Other Ambulatory Visit: Payer: Self-pay | Admitting: Family Medicine

## 2016-05-21 ENCOUNTER — Other Ambulatory Visit: Payer: Self-pay | Admitting: Family Medicine

## 2016-05-21 NOTE — Telephone Encounter (Signed)
Left refill on voice mail at pharmacy  

## 2016-05-21 NOTE — Telephone Encounter (Signed)
Last f/u 10/2015

## 2016-05-29 ENCOUNTER — Other Ambulatory Visit: Payer: Self-pay | Admitting: Family Medicine

## 2016-05-29 NOTE — Telephone Encounter (Signed)
Rx called in to requested pharmacy 

## 2016-05-29 NOTE — Telephone Encounter (Signed)
Last f/u 10/2015

## 2016-06-25 ENCOUNTER — Other Ambulatory Visit: Payer: Self-pay | Admitting: Family Medicine

## 2016-06-25 NOTE — Telephone Encounter (Signed)
Last f/u 01/2016 

## 2016-06-25 NOTE — Telephone Encounter (Signed)
Rx called in to requested pharmacy 

## 2016-06-25 NOTE — Telephone Encounter (Signed)
Pt left v/m requesting status of fiorinal with codeine refill. Spoke with [facility] and did get rx call in off machine. Per DPR left v/m for pt to ck with pharmacy for pick up of med.

## 2016-07-01 ENCOUNTER — Other Ambulatory Visit: Payer: Self-pay | Admitting: Family Medicine

## 2016-07-03 ENCOUNTER — Other Ambulatory Visit: Payer: Self-pay | Admitting: Family Medicine

## 2016-07-03 NOTE — Telephone Encounter (Signed)
Last f/u 10/2015-CPE

## 2016-07-04 NOTE — Telephone Encounter (Signed)
Rx called in to requested pharmacy 

## 2016-07-22 ENCOUNTER — Other Ambulatory Visit: Payer: Self-pay | Admitting: Family Medicine

## 2016-07-22 NOTE — Telephone Encounter (Signed)
Last f/u 10/2015

## 2016-07-22 NOTE — Telephone Encounter (Signed)
Rx called in to requested pharmacy 

## 2016-08-05 ENCOUNTER — Other Ambulatory Visit: Payer: Self-pay | Admitting: Family Medicine

## 2016-08-06 NOTE — Telephone Encounter (Signed)
Last f/u 10/2015-CPE

## 2016-08-06 NOTE — Telephone Encounter (Signed)
Rx called into requested  

## 2016-08-13 ENCOUNTER — Other Ambulatory Visit: Payer: Self-pay | Admitting: *Deleted

## 2016-08-13 MED ORDER — SERTRALINE HCL 25 MG PO TABS: 25.0000 mg | ORAL_TABLET | Freq: Every day | ORAL | 1 refills | Status: DC

## 2016-08-16 IMAGING — MG MM DIGITAL SCREENING BILATERAL
3 series · 3 of 3 positions shown · non-contrast
Comparison: Previous exam(s).

CLINICAL DATA: Screening.

EXAM:
DIGITAL SCREENING BILATERAL MAMMOGRAM WITH CAD

[R MLO]
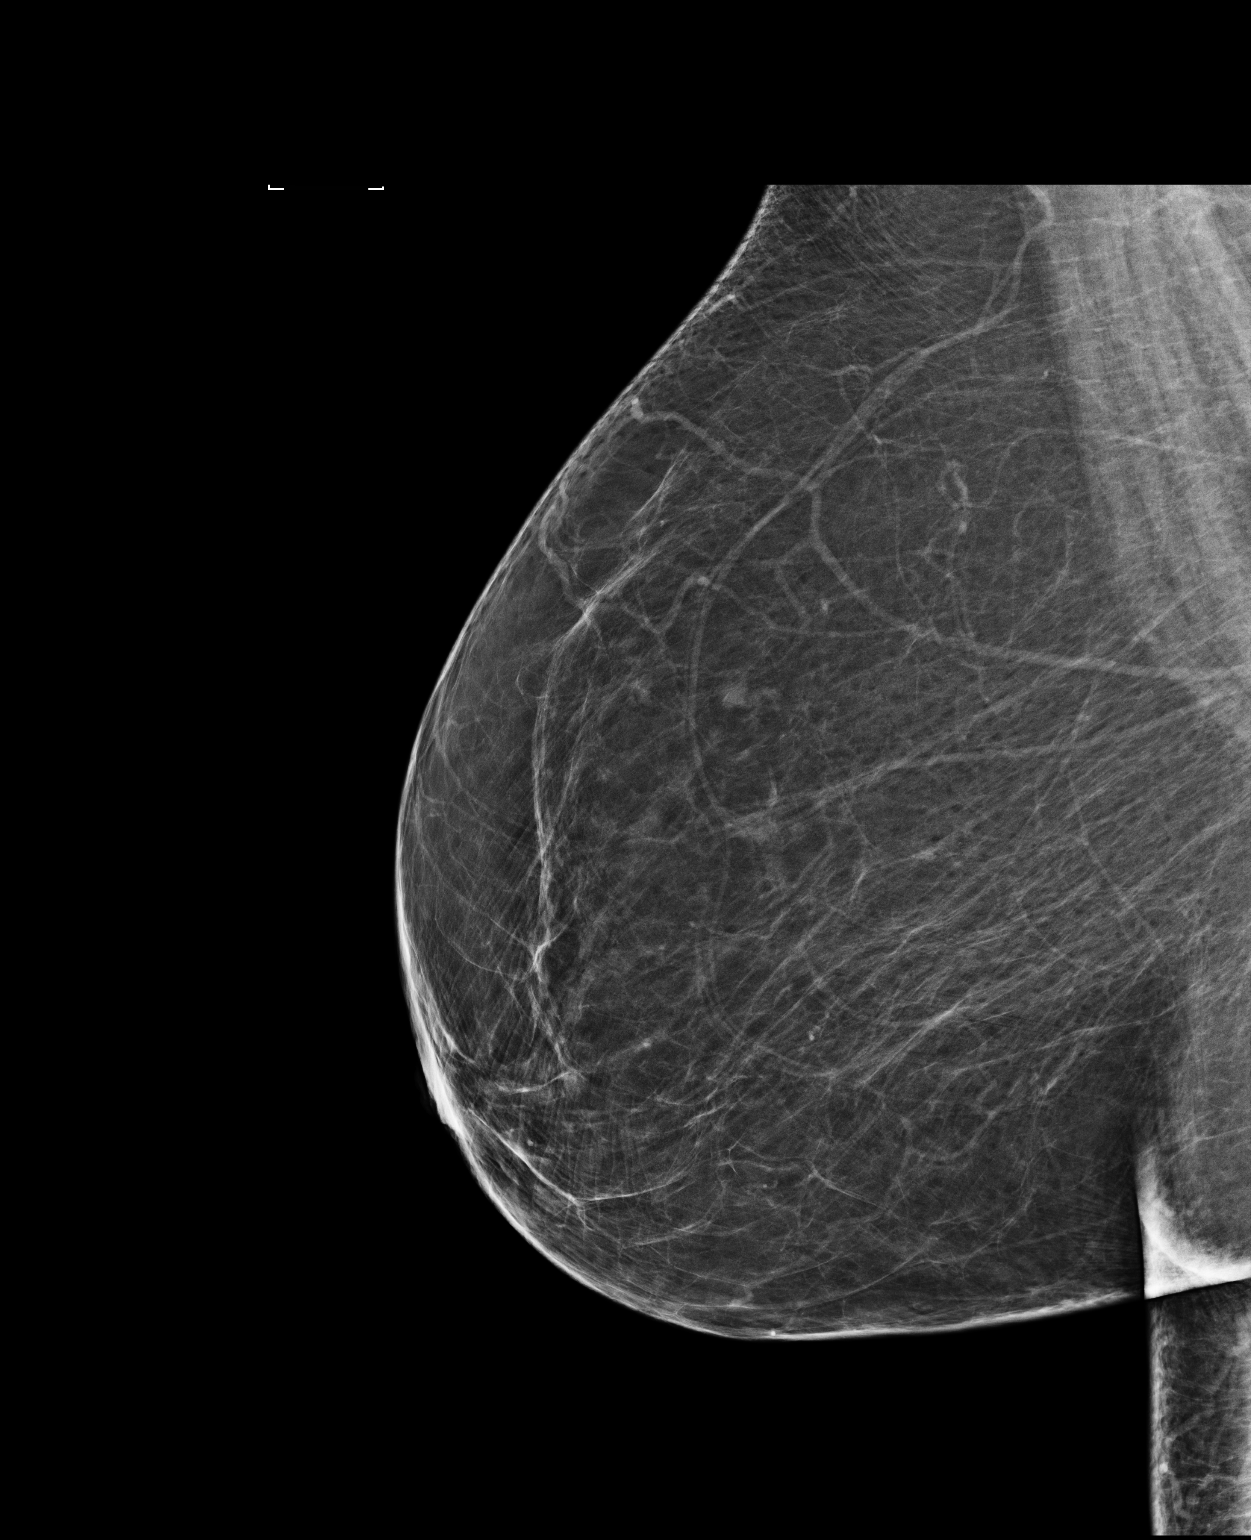

[R CC]
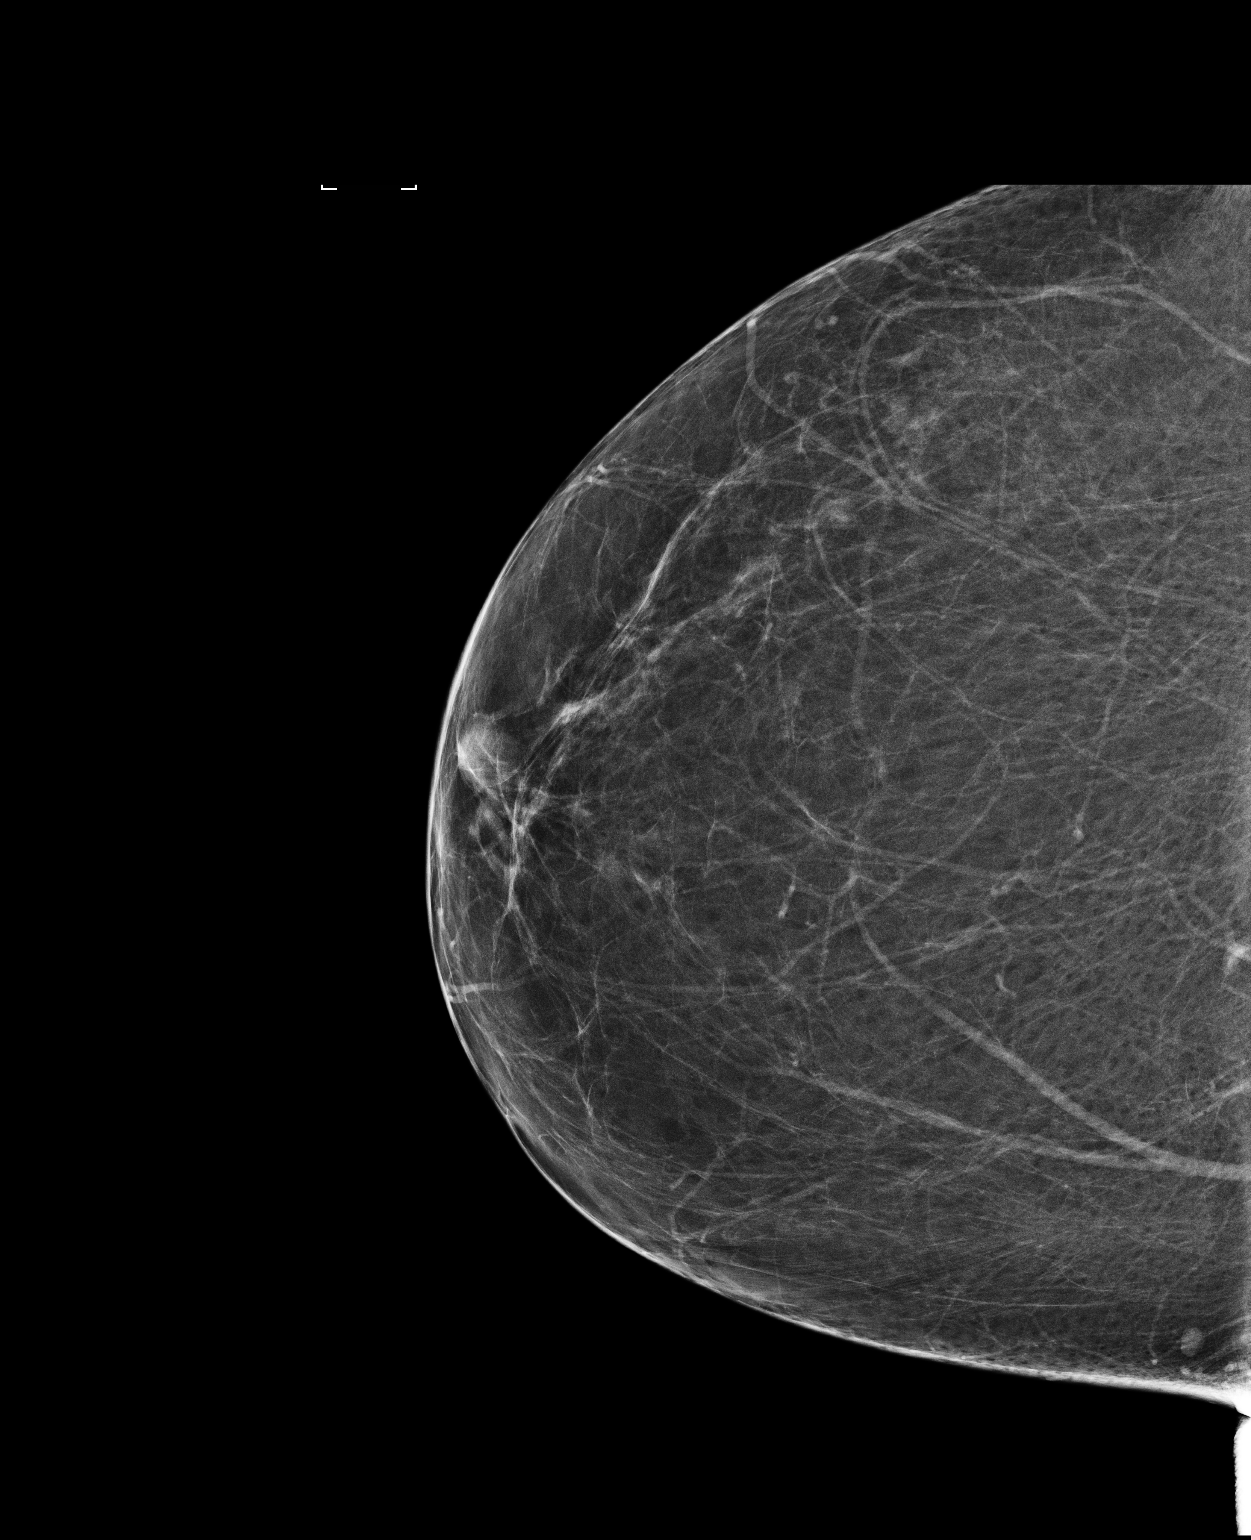

[L MLO]
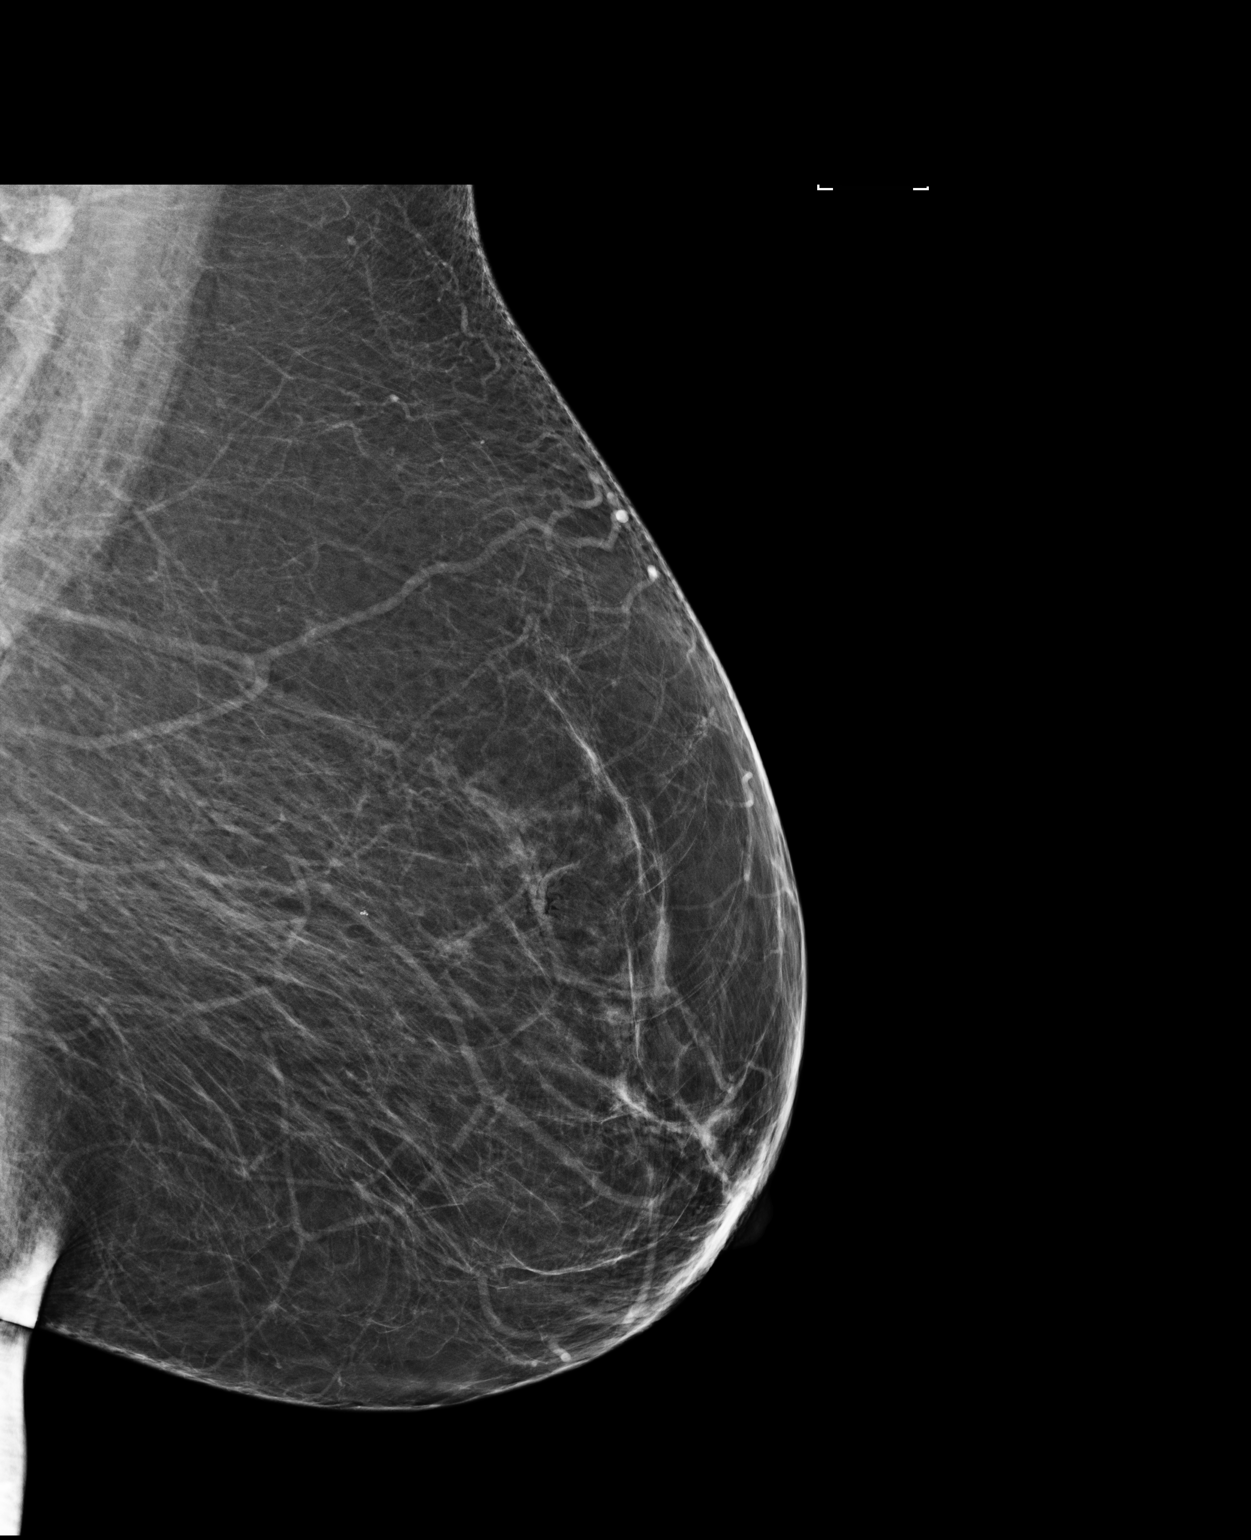

[3 of 3 positions shown; findings below may reference images not displayed]

ACR Breast Density Category b: There are scattered areas of
fibroglandular density.
FINDINGS: There are no findings suspicious for malignancy. Images were
processed with CAD.
IMPRESSION: No mammographic evidence of malignancy. A result letter of this
screening mammogram will be mailed directly to the patient.

RECOMMENDATION:
Screening mammogram in one year. (Code:AS-G-LCT)

BI-RADS CATEGORY  1: Negative.

## 2016-08-25 ENCOUNTER — Other Ambulatory Visit: Payer: Self-pay | Admitting: Family Medicine

## 2016-08-25 NOTE — Telephone Encounter (Signed)
Rx called in to requested pharmacy 

## 2016-08-25 NOTE — Telephone Encounter (Signed)
Last f/u 10/2015-CPE

## 2016-08-28 ENCOUNTER — Telehealth: Payer: Self-pay

## 2016-08-28 NOTE — Telephone Encounter (Signed)
Lm on pts vm and advised per Dr Aron.  

## 2016-08-28 NOTE — Telephone Encounter (Signed)
Yes should be ok to take.  Please keep [place] updated.

## 2016-08-28 NOTE — Telephone Encounter (Signed)
Pt left v/m;pt has developed cough with wheeze; pt gets bronchitis like problem every year; pt wants to verify pt can take mucinex and extra strength tylenol with pts regular meds. Pt request cb.

## 2016-09-05 ENCOUNTER — Other Ambulatory Visit: Payer: Self-pay | Admitting: Family Medicine

## 2016-09-05 NOTE — Telephone Encounter (Signed)
PHONED IN TO.  [facility] [place] [place]

## 2016-09-05 NOTE — Telephone Encounter (Signed)
Ok to phone in Valium

## 2016-09-05 NOTE — Telephone Encounter (Signed)
#  60 last filled on 08/06/16, last ov was an acute with Dr. Deborra Medina on 04/10/16, before that she had a f/u with [name] on 02/26/16. No future appt's scheduled.

## 2016-09-10 ENCOUNTER — Other Ambulatory Visit: Payer: Self-pay | Admitting: Family Medicine

## 2016-09-23 ENCOUNTER — Other Ambulatory Visit: Payer: Self-pay | Admitting: Family Medicine

## 2016-09-24 NOTE — Telephone Encounter (Signed)
Rx called in to requested pharmacy 

## 2016-09-24 NOTE — Telephone Encounter (Signed)
Last f/u 01/2016 

## 2016-09-29 ENCOUNTER — Telehealth: Payer: Self-pay

## 2016-09-29 DIAGNOSIS — R198 Other specified symptoms and signs involving the digestive system and abdomen: Secondary | ICD-10-CM

## 2016-09-29 NOTE — Telephone Encounter (Signed)
Pt states she was seen about 6 months with  "stomach issues" an H pylori came back negative and the next step was a colonoscopy. Pt would like to have that set up or she need to be seen first?

## 2016-09-29 NOTE — Telephone Encounter (Signed)
GI referral placed

## 2016-09-30 ENCOUNTER — Encounter: Payer: Self-pay | Admitting: Family Medicine

## 2016-09-30 ENCOUNTER — Encounter: Payer: Self-pay | Admitting: *Deleted

## 2016-09-30 ENCOUNTER — Ambulatory Visit (INDEPENDENT_AMBULATORY_CARE_PROVIDER_SITE_OTHER): Payer: 59 | Admitting: Family Medicine

## 2016-09-30 VITALS — BP 132/76 | HR 86 | Temp 98.1°F | Wt 209.5 lb

## 2016-09-30 DIAGNOSIS — J209 Acute bronchitis, unspecified: Secondary | ICD-10-CM | POA: Diagnosis not present

## 2016-09-30 DIAGNOSIS — J0120 Acute ethmoidal sinusitis, unspecified: Secondary | ICD-10-CM | POA: Diagnosis not present

## 2016-09-30 MED ORDER — VALACYCLOVIR HCL 1 G PO TABS: ORAL_TABLET | ORAL | 0 refills | Status: DC

## 2016-09-30 MED ORDER — AZITHROMYCIN 250 MG PO TABS: ORAL_TABLET | ORAL | 0 refills | Status: DC

## 2016-09-30 MED ORDER — ALBUTEROL SULFATE HFA 108 (90 BASE) MCG/ACT IN AERS: 2.0000 | INHALATION_SPRAY | Freq: Four times a day (QID) | RESPIRATORY_TRACT | 0 refills | Status: DC | PRN

## 2016-09-30 MED ORDER — HYDROCOD POLST-CPM POLST ER 10-8 MG/5ML PO SUER: 5.0000 mL | Freq: Two times a day (BID) | ORAL | 0 refills | Status: DC | PRN

## 2016-09-30 NOTE — Progress Notes (Signed)
SUBJECTIVE:  Diane [name] is a 65 y.o. female who complains of coryza, congestion, sneezing, productive cough and wheezes for 20 days. She denies a history of anorexia and chest pain and denies a history of asthma. Patient denies smoke cigarettes.  Also noticed another outbreak of fever blisters on her upper lip a couple of days ago.   Delsym OTC has helped.   Current Outpatient Prescriptions on File Prior to Visit  Medication Sig Dispense Refill  . benazepril-hydrochlorthiazide (LOTENSIN HCT) 20-25 MG tablet Take 1 tablet by mouth  every day. COMPLETE PHYSICAL EXAM REQUIRED FOR ADDITIONAL REFILLS 90 tablet 0  . butalbital-acetaminophen-caffeine (FIORICET WITH CODEINE) 50-325-40-30 MG capsule TAKE 1-2 CAPSULES BY MOUTH EVERY DAY AS NEEDED FOR PAIN 30 capsule 0  . cholecalciferol (VITAMIN D) 1000 UNITS tablet Take 1,000 Units by mouth daily.    . diazepam (VALIUM) 5 MG tablet TAKE 1/2-1 TABLET BY MOUTH EVERY 12 HOURS AS NEEDED FOR ANXIETY 60 tablet 0  . esomeprazole (NEXIUM) 40 MG capsule Take 40 mg by mouth daily as needed.     . ezetimibe (ZETIA) 10 MG tablet Take 1 tablet (10 mg total) by mouth daily. 90 tablet 2  . pravastatin (PRAVACHOL) 40 MG tablet Take 1 tablet by mouth  every day 90 tablet 3  . sertraline (ZOLOFT) 25 MG tablet Take 1 tablet (25 mg total) by mouth daily. 90 tablet 1   No current facility-administered medications on file prior to visit.     No Known Allergies  Past Medical History:  Diagnosis Date  . Arthritis   . CAD (coronary artery disease)   . 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 Depression   . Ejection fraction   . GERD (gastroesophageal reflux disease)   . Hyperlipidemia   . Hypertension   . PONV (postoperative nausea and vomiting)   . Skin cancer of face 05/2015   basal cell  . Stroke [facility]) 2006   deaf in right ear    Past Surgical History:  Procedure  Laterality Date  . BREAST BIOPSY Left 1984   abcess  . CESAREAN SECTION    . CHOLECYSTECTOMY  1990  . CHONDROPLASTY Left 01/05/2015   Procedure: CHONDROPLASTY;  Surgeon: Yvette Rack., MD;  Location: [place];  Service: Orthopedics;  Laterality: Left;  . KNEE ARTHROSCOPY WITH MEDIAL MENISECTOMY Left 01/05/2015   Procedure: LEFT KNEE ARTHROSCOPY WITH MEDIAL AND LATERAL MENISECTOMY; DEBRIDEMENT LATERAL PATELLA FEMORAL ;  Surgeon: Yvette Rack., MD;  Location: [place];  Service: Orthopedics;  Laterality: Left;  . LEFT HEART CATHETERIZATION WITH CORONARY ANGIOGRAM N/A 10/28/2013   Procedure: LEFT HEART CATHETERIZATION WITH CORONARY ANGIOGRAM;  Surgeon: Larey Dresser, MD;  Location: [facility] CATH LAB;  Service: Cardiovascular;  Laterality: N/A;  . TOTAL KNEE ARTHROPLASTY Left 07/06/2015   Procedure: TOTAL KNEE ARTHROPLASTY;  Surgeon: Earlie Server, MD;  Location: [place];  Service: Orthopedics;  Laterality: Left;  . TUBAL LIGATION      Family History  Problem Relation Age of Onset  . Stroke Father   . Heart failure Father     Social History   Social History  . Marital status: Divorced    Spouse name: [name]  . Number of children: 8  . Years of education: N/A   Occupational History  . 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   Social History Main Topics  . Smoking status: Never Smoker  . Smokeless tobacco: Never Used     Comment: LIVES WITH 2 SMOKERS   . Alcohol use Yes     Comment: Rare social ETOH  . Drug use: No  . Sexual activity: Not on file   Other Topics Concern  . Not on file   Social History Narrative   Divorced, but has fiancee.  Lives with fiancee in [place].  Moved here recently from [place], [place].     She 8 children- ages 74- 63.               The PMH, PSH, Social History, Family History, Medications, and allergies have been reviewed in [facility], and have been updated if relevant.  OBJECTIVE: BP 132/76   Pulse 86    Temp 98.1 F (36.7 C) (Oral)   Wt 209 lb 8 oz (95 kg)   SpO2 98%   BMI 38.01 kg/m   She appears well, vital signs are as noted. Ears normal.  Throat and pharynx normal.  Neck supple. No adenopathy in the neck. Nose is congested. Sinuses tender. Wheezes throughout Skin:  Fever blisters (2 ) on upper lip  ASSESSMENT:  Sinusitis, herpes labialis and bronchitis  PLAN: Zpack, albuterol as needed. tussionex rx printed and given to pt to use for severe cough- discussed sedation precautions.  Symptomatic therapy suggested: push fluids, rest and return office visit prn if symptoms persist or worsen.Call or return to clinic prn if these symptoms worsen or fail to improve as anticipated.  Valtrex eRx sent.

## 2016-09-30 NOTE — Progress Notes (Signed)
Pre visit review using our clinic review tool, if applicable. No additional management support is needed unless otherwise documented below in the visit note. 

## 2016-10-06 ENCOUNTER — Other Ambulatory Visit: Payer: Self-pay | Admitting: Internal Medicine

## 2016-10-06 NOTE — Telephone Encounter (Signed)
Last filled 09/05/16 by Regina--please advise

## 2016-10-07 ENCOUNTER — Encounter: Payer: Self-pay | Admitting: Gastroenterology

## 2016-10-07 NOTE — Telephone Encounter (Signed)
Rx called in to requested pharmacy 

## 2016-10-08 ENCOUNTER — Ambulatory Visit: Payer: Self-pay | Admitting: Gastroenterology

## 2016-10-20 ENCOUNTER — Other Ambulatory Visit: Payer: Self-pay | Admitting: Family Medicine

## 2016-10-22 ENCOUNTER — Other Ambulatory Visit: Payer: Self-pay | Admitting: Family Medicine

## 2016-10-22 NOTE — Telephone Encounter (Signed)
Rx called in to requested pharmacy 

## 2016-10-22 NOTE — Telephone Encounter (Signed)
Last f/u 01/2016 

## 2016-10-23 ENCOUNTER — Other Ambulatory Visit: Payer: Self-pay | Admitting: Family Medicine

## 2016-10-29 ENCOUNTER — Telehealth: Payer: Self-pay | Admitting: Family Medicine

## 2016-10-29 NOTE — Telephone Encounter (Signed)
Lm on pts vm and requested a call back with update of current status

## 2016-10-29 NOTE — Telephone Encounter (Signed)
[place]  Patient Name: Diane [name]  DOB: 1951-11-09    Initial Comment nausea, some vomiting, severe indigestion been burping for the last 2 days come/go   Nurse Assessment  Nurse: Wayne Sever, RN, Tillie Rung Date/Time (Eastern Time): 10/29/2016 9:53:39 AM  Confirm and document reason for call. If symptomatic, describe symptoms. You must click the next button to save text entered. ---Caller states she is having nausea. She states she only threw up one time for the nausea. She is laying down, but still very nauseated. Denies any pain.  Has the patient traveled out of the country within the last 30 days? ---Not Applicable  Does the patient have any new or worsening symptoms? ---Yes  Will a triage be completed? ---Yes  Related visit to physician within the last 2 weeks? ---No  Does the PT have any chronic conditions? (i.e. diabetes, asthma, etc.) ---Yes  List chronic conditions. ---GERD, HTN  Is this a behavioral health or substance abuse call? ---No     Guidelines    Guideline Title Affirmed Question Affirmed Notes  Nausea Unexplained nausea (all triage questions negative)    Final Disposition User   Robin [place], RN, Tillie Rung    Referrals  REFERRED TO PCP OFFICE   Disagree/Comply: Comply

## 2016-10-29 NOTE — Telephone Encounter (Signed)
Please call to check on pt. 

## 2016-10-29 NOTE — Telephone Encounter (Signed)
Patient returned [facility] call.  Patient can be reached at [phone].

## 2016-10-31 NOTE — Telephone Encounter (Signed)
Spoke to pt who states she is feeling better. She went appx 10hours and only drank ginger ale, which she believes may have helped her to improve

## 2016-11-11 ENCOUNTER — Other Ambulatory Visit: Payer: Self-pay

## 2016-11-11 MED ORDER — DIAZEPAM 5 MG PO TABS: ORAL_TABLET | ORAL | 0 refills | Status: DC

## 2016-11-11 NOTE — Telephone Encounter (Signed)
Pt left v/m requesting refill valium to [facility] [address] ([place]) [place] refilled #[phone] on 10/06/16. Scheduled CPX on 11/24/16.

## 2016-11-12 NOTE — Telephone Encounter (Signed)
Rx called in to requested pharmacy 

## 2016-11-24 ENCOUNTER — Encounter: Payer: Self-pay | Admitting: Family Medicine

## 2016-11-24 ENCOUNTER — Ambulatory Visit (INDEPENDENT_AMBULATORY_CARE_PROVIDER_SITE_OTHER): Payer: 59 | Admitting: Family Medicine

## 2016-11-24 VITALS — BP 144/80 | HR 65 | Temp 98.1°F | Ht 62.25 in | Wt 203.0 lb

## 2016-11-24 DIAGNOSIS — F411 Generalized anxiety disorder: Secondary | ICD-10-CM

## 2016-11-24 DIAGNOSIS — E785 Hyperlipidemia, unspecified: Secondary | ICD-10-CM | POA: Diagnosis not present

## 2016-11-24 DIAGNOSIS — Z01419 Encounter for gynecological examination (general) (routine) without abnormal findings: Secondary | ICD-10-CM | POA: Insufficient documentation

## 2016-11-24 DIAGNOSIS — K219 Gastro-esophageal reflux disease without esophagitis: Secondary | ICD-10-CM | POA: Diagnosis not present

## 2016-11-24 DIAGNOSIS — I10 Essential (primary) hypertension: Secondary | ICD-10-CM | POA: Diagnosis not present

## 2016-11-24 DIAGNOSIS — Z8041 Family history of malignant neoplasm of ovary: Secondary | ICD-10-CM

## 2016-11-24 MED ORDER — BUTALBITAL-APAP-CAFF-COD 50-325-40-30 MG PO CAPS: ORAL_CAPSULE | ORAL | 0 refills | Status: DC

## 2016-11-24 NOTE — Progress Notes (Signed)
Pre visit review using our clinic review tool, if applicable. No additional management support is needed unless otherwise documented below in the visit note. 

## 2016-11-24 NOTE — Assessment & Plan Note (Signed)
Has been well controlled. Due for labs.

## 2016-11-24 NOTE — Progress Notes (Signed)
Subjective:    Patient ID: Diane [name], female    DOB: 05-15-1951, 65 y.o.   MRN: [id number]  HPI  Very pleasant 65 yo G8P8 with h/o CVA, HTN, HLD, anxiety here for CPX and follow up of chronic medical conditions.  Refuses colonoscopy but does agree to IFOB-did mail in stool cards to [facility] but we never received results. She does have an appointment with GI next week.   No family h/o breast, uterine or cervical CA. Grandmother had ovarian CA. LMP 5 years ago.  Last pap smear 11/01/15- done by me. Mammogram 11/01/15   Anxiety- in 02/2015- started on zoloft 25 mg daily, weaned off celexa and refilled valium to use prn severe anxiety.  Also referred for psychotherapy. She feels symptoms are controlled.  Denies any current symptoms of anxiety or depression. She has changed jobs now and her new role is less stressful.  H/o CVA- on ASA 81 mg daily. Residual right ear deafness otherwise no residual deficits.  HTN- Well controlled on benazapril_HCTZ. No HA, blurred vision, CP or SOB.  Lab Results  Component Value Date   CREATININE 0.67 04/10/2016     HLD- on pravachol 40 mg daily. Lab Results  Component Value Date   CHOL 248 (H) 02/26/2016   HDL 69 02/26/2016   LDLCALC 148 (H) 02/26/2016   TRIG 154 (H) 02/26/2016   CHOLHDL 3.6 02/26/2016   Lab Results  Component Value Date   ALT 23 04/10/2016   AST 21 04/10/2016   ALKPHOS 65 04/10/2016   BILITOT 0.5 04/10/2016    Patient Active Problem List  Diagnosis  . GERD (gastroesophageal reflux disease)  . Hypertension  . Generalized anxiety disorder  . History of CVA (cerebrovascular accident)  . Dyslipidemia  . Dyspnea on exertion  . Hyperlipidemia  . Ejection fraction  . CAD (coronary artery disease)  . Primary localized osteoarthritis of left knee  . Dysuria  . OA (osteoarthritis)  . GI symptoms  . Well woman exam   Past Medical History:  Diagnosis Date  . Arthritis   . CAD (coronary artery disease)   .  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 Depression   . Ejection fraction   . GERD (gastroesophageal reflux disease)   . Hyperlipidemia   . Hypertension   . PONV (postoperative nausea and vomiting)   . Skin cancer of face 05/2015   basal cell  . Stroke [facility]) 2006   deaf in right ear   Past Surgical History:  Procedure Laterality Date  . BREAST BIOPSY Left 1984   abcess  . CESAREAN SECTION    . CHOLECYSTECTOMY  1990  . CHONDROPLASTY Left 01/05/2015   Procedure: CHONDROPLASTY;  Surgeon: Yvette Rack., MD;  Location: [place];  Service: Orthopedics;  Laterality: Left;  . KNEE ARTHROSCOPY WITH MEDIAL MENISECTOMY Left 01/05/2015   Procedure: LEFT KNEE ARTHROSCOPY WITH MEDIAL AND LATERAL MENISECTOMY; DEBRIDEMENT LATERAL PATELLA FEMORAL ;  Surgeon: Yvette Rack., MD;  Location: [place];  Service: Orthopedics;  Laterality: Left;  . LEFT HEART CATHETERIZATION WITH CORONARY ANGIOGRAM N/A 10/28/2013   Procedure: LEFT HEART CATHETERIZATION WITH CORONARY ANGIOGRAM;  Surgeon: Larey Dresser, MD;  Location: [facility] CATH LAB;  Service: Cardiovascular;  Laterality: N/A;  . TOTAL KNEE ARTHROPLASTY Left 07/06/2015   Procedure: TOTAL KNEE ARTHROPLASTY;  Surgeon: Earlie Server, MD;  Location: [place];  Service: Orthopedics;  Laterality: Left;  . TUBAL LIGATION     Social History  Substance Use Topics  . Smoking status: Never Smoker  . Smokeless tobacco: Never Used     Comment: LIVES WITH 2 SMOKERS   . Alcohol use Yes     Comment: Rare social ETOH   Family History  Problem Relation Age of Onset  . Stroke Father   . Heart failure Father    No Known Allergies Current Outpatient Prescriptions on File Prior to Visit  Medication Sig Dispense Refill  . benazepril-hydrochlorthiazide (LOTENSIN HCT) 20-25 MG tablet Take 1 tablet by mouth  every day. COMPLETE PHYSICAL EXAM REQUIRED FOR ADDITIONAL  REFILLS 90 tablet 0  . cholecalciferol (VITAMIN D) 1000 UNITS tablet Take 1,000 Units by mouth daily.    . diazepam (VALIUM) 5 MG tablet TAKE ONE-HALF TO 1 TABLET BY MOUTH EVERY 12 HOURS AS NEEDED FOR ANXIETY 60 tablet 0  . esomeprazole (NEXIUM) 40 MG capsule Take 40 mg by mouth daily as needed.     . ezetimibe (ZETIA) 10 MG tablet Take 1 tablet (10 mg total) by mouth daily. 90 tablet 2  . pravastatin (PRAVACHOL) 40 MG tablet Take 1 tablet by mouth  every day 90 tablet 3  . PROVENTIL HFA 108 (90 Base) MCG/ACT inhaler INHALE 2 PUFFS INTO THE LUNGS EVERY 6 HOURS AS NEEDED 6.7 g 0  . sertraline (ZOLOFT) 25 MG tablet Take 1 tablet (25 mg total) by mouth daily. 90 tablet 1  . valACYclovir (VALTREX) 1000 MG tablet TAKE 1 TABLET BY MOUTH TWICE DAILY 10 tablet 5   No current facility-administered medications on file prior to visit.    The PMH, PSH, Social History, Family History, Medications, and allergies have been reviewed in [facility], and have been updated if relevant.   Review of Systems  Constitutional: Negative.   Respiratory: Negative.   Cardiovascular: Negative.   Gastrointestinal: Negative.   Endocrine: Negative.   Genitourinary: Negative for dysuria, frequency and hematuria.  Musculoskeletal: Negative.   Skin: Negative.   Neurological: Negative.   Hematological: Negative.   Psychiatric/Behavioral: Negative.   All other systems reviewed and are negative.       Objective:   Physical Exam BP (!) 144/80   Pulse 65   Temp 98.1 F (36.7 C) (Oral)   Ht 5' 2.25" (1.581 m)   Wt 203 lb (92.1 kg)   SpO2 98%   BMI 36.83 kg/m    General:  Well-developed,well-nourished,in no acute distress; alert,appropriate and cooperative throughout examination Head:  normocephalic and atraumatic.   Eyes:  vision grossly intact, PERRL Ears:  R ear normal and L ear normal externally, TMs clear bilaterally Nose:  no external deformity.   Mouth:  good dentition.   Neck:  No deformities, masses, or  tenderness noted. Breasts:  No mass, nodules, thickening, tenderness, bulging, retraction, inflamation, nipple discharge or skin changes noted.   Lungs:  Normal respiratory effort, chest expands symmetrically. Lungs are clear to auscultation, no crackles or wheezes. Heart:  Normal rate and regular rhythm. S1 and S2 normal without gallop, murmur, click, rub or other extra sounds. Abdomen:  Bowel sounds positive,abdomen soft and non-tender without masses, organomegaly or hernias noted. Msk:  No deformity or scoliosis noted of thoracic or lumbar spine.   Extremities:  No clubbing, cyanosis, edema, or deformity noted with normal full range of motion of all joints.   Neurologic:  alert & oriented X3 and gait normal.   Skin:  Intact without suspicious lesions or rashes Cervical Nodes:  No lymphadenopathy noted Axillary Nodes:  No palpable lymphadenopathy Psych:  Cognition and judgment appear intact. Alert and cooperative with normal attention span and concentration. No apparent delusions, illusions, hallucinations      Assessment & Plan:

## 2016-11-24 NOTE — Assessment & Plan Note (Signed)
Reviewed preventive care protocols, scheduled due services, and updated immunizations Discussed nutrition, exercise, diet, and healthy lifestyle.  

## 2016-11-24 NOTE — Assessment & Plan Note (Signed)
Well controlled.  No changes made. 

## 2016-11-24 NOTE — Assessment & Plan Note (Signed)
Reasonable control. No changes made. 

## 2016-11-24 NOTE — Patient Instructions (Signed)
Great to see you. Happy Holidays!  We will call you with your lab results and you can view them online. 

## 2016-11-25 ENCOUNTER — Other Ambulatory Visit: Payer: Self-pay | Admitting: Family Medicine

## 2016-11-25 DIAGNOSIS — Z1231 Encounter for screening mammogram for malignant neoplasm of breast: Secondary | ICD-10-CM

## 2016-11-25 LAB — COMPREHENSIVE METABOLIC PANEL
ALBUMIN: 4.7 g/dL (ref 3.6–4.8)
ALT: 36 IU/L — AB (ref 0–32)
AST: 26 IU/L (ref 0–40)
Albumin/Globulin Ratio: 1.8 (ref 1.2–2.2)
Alkaline Phosphatase: 76 IU/L (ref 39–117)
BILIRUBIN TOTAL: 0.5 mg/dL (ref 0.0–1.2)
BUN / CREAT RATIO: 13 (ref 12–28)
BUN: 8 mg/dL (ref 8–27)
CALCIUM: 9.8 mg/dL (ref 8.7–10.3)
CHLORIDE: 97 mmol/L (ref 96–106)
CO2: 25 mmol/L (ref 18–29)
Creatinine, Ser: 0.63 mg/dL (ref 0.57–1.00)
GFR, EST AFRICAN AMERICAN: 110 mL/min/{1.73_m2} (ref >59)
GFR, EST NON AFRICAN AMERICAN: 95 mL/min/{1.73_m2} (ref >59)
Globulin, Total: 2.6 g/dL (ref 1.5–4.5)
Glucose: 96 mg/dL (ref 65–99)
Potassium: 4.7 mmol/L (ref 3.5–5.2)
Sodium: 140 mmol/L (ref 134–144)
TOTAL PROTEIN: 7.3 g/dL (ref 6.0–8.5)

## 2016-11-25 LAB — CBC WITH DIFFERENTIAL/PLATELET
BASOS ABS: 0 10*3/uL (ref 0.0–0.2)
BASOS: 0 %
EOS (ABSOLUTE): 0.1 10*3/uL (ref 0.0–0.4)
Eos: 1 %
HEMOGLOBIN: 14.6 g/dL (ref 11.1–15.9)
Hematocrit: 44 % (ref 34.0–46.6)
IMMATURE GRANS (ABS): 0 10*3/uL (ref 0.0–0.1)
IMMATURE GRANULOCYTES: 0 %
LYMPHS: 21 %
Lymphocytes Absolute: 1.1 10*3/uL (ref 0.7–3.1)
MCH: 33 pg (ref 26.6–33.0)
MCHC: 33.2 g/dL (ref 31.5–35.7)
MCV: 99 fL — AB (ref 79–97)
MONOCYTES: 4 %
Monocytes Absolute: 0.2 10*3/uL (ref 0.1–0.9)
NEUTROS ABS: 3.8 10*3/uL (ref 1.4–7.0)
NEUTROS PCT: 74 %
Platelets: 205 10*3/uL (ref 150–379)
RBC: 4.43 x10E6/uL (ref 3.77–5.28)
RDW: 12.9 % (ref 12.3–15.4)
WBC: 5.3 10*3/uL (ref 3.4–10.8)

## 2016-11-25 LAB — LIPID PANEL
CHOL/HDL RATIO: 4 ratio (ref 0.0–4.4)
Cholesterol, Total: 290 mg/dL — ABNORMAL HIGH (ref 100–199)
HDL: 73 mg/dL (ref >39)
LDL CALC: 188 mg/dL — AB (ref 0–99)
Triglycerides: 143 mg/dL (ref 0–149)
VLDL CHOLESTEROL CAL: 29 mg/dL (ref 5–40)

## 2016-11-25 LAB — TSH: TSH: 0.815 u[IU]/mL (ref 0.450–4.500)

## 2016-11-26 LAB — CA 125: CA 125: 12.6 U/mL (ref 0.0–38.1)

## 2016-12-04 ENCOUNTER — Other Ambulatory Visit: Payer: Self-pay | Admitting: Family Medicine

## 2016-12-05 ENCOUNTER — Encounter: Payer: Self-pay | Admitting: *Deleted

## 2016-12-08 ENCOUNTER — Encounter: Payer: Self-pay | Admitting: Gastroenterology

## 2016-12-08 ENCOUNTER — Ambulatory Visit (INDEPENDENT_AMBULATORY_CARE_PROVIDER_SITE_OTHER): Payer: 59 | Admitting: Gastroenterology

## 2016-12-08 VITALS — BP 116/80 | HR 88 | Ht 62.25 in | Wt 205.2 lb

## 2016-12-08 DIAGNOSIS — Z1211 Encounter for screening for malignant neoplasm of colon: Secondary | ICD-10-CM | POA: Diagnosis not present

## 2016-12-08 DIAGNOSIS — K219 Gastro-esophageal reflux disease without esophagitis: Secondary | ICD-10-CM | POA: Diagnosis not present

## 2016-12-08 DIAGNOSIS — Z1212 Encounter for screening for malignant neoplasm of rectum: Secondary | ICD-10-CM

## 2016-12-08 MED ORDER — NA SULFATE-K SULFATE-MG SULF 17.5-3.13-1.6 GM/177ML PO SOLN: 1.0000 | Freq: Once | ORAL | 0 refills | Status: AC

## 2016-12-08 MED ORDER — ESOMEPRAZOLE MAGNESIUM 40 MG PO CPDR: 40.0000 mg | DELAYED_RELEASE_CAPSULE | Freq: Two times a day (BID) | ORAL | 5 refills | Status: DC

## 2016-12-08 NOTE — Progress Notes (Signed)
History of Present Illness: This is a 65 year old female referred by Lucille Passy, MD for the evaluation of diarrhea, incontinence, lower abdominal pain, belching and bloating. These symptoms were present for several months. The patient states she changed her diet to a healthier diet with more fiber and water intake and more importantly she switched her job to a lower stress job and all these symptoms completely resolved. She has had chronic GERD and relates a history of a hiatal hernia diagnosed in 1992. On Nexium once daily she states she has breakthrough symptoms about 3-4 times per week and these symptoms persist. She has not previously had colonoscopy or EGD. Denies weight loss,  constipation, change in stool caliber, melena, hematochezia, nausea, vomiting, dysphagia, chest pain.     No Known Allergies Outpatient Medications Prior to Visit  Medication Sig Dispense Refill  . benazepril-hydrochlorthiazide (LOTENSIN HCT) 20-25 MG tablet TAKE 1 TABLET BY MOUTH  EVERY DAY 90 tablet 1  . butalbital-acetaminophen-caffeine (FIORICET WITH CODEINE) 50-325-40-30 MG capsule TAKE 1-2 CAPSULES BY MOUTH EVERY DAY AS NEEDED FOR PAIN 30 capsule 0  . cholecalciferol (VITAMIN D) 1000 UNITS tablet Take 1,000 Units by mouth daily.    . diazepam (VALIUM) 5 MG tablet TAKE ONE-HALF TO 1 TABLET BY MOUTH EVERY 12 HOURS AS NEEDED FOR ANXIETY 60 tablet 0  . esomeprazole (NEXIUM) 40 MG capsule Take 40 mg by mouth daily as needed.     . ezetimibe (ZETIA) 10 MG tablet TAKE 1 TABLET BY MOUTH  DAILY 90 tablet 1  . pravastatin (PRAVACHOL) 40 MG tablet Take 1 tablet by mouth  every day 90 tablet 3  . PROVENTIL HFA 108 (90 Base) MCG/ACT inhaler INHALE 2 PUFFS INTO THE LUNGS EVERY 6 HOURS AS NEEDED 6.7 g 0  . sertraline (ZOLOFT) 25 MG tablet TAKE 1 TABLET BY MOUTH  DAILY 90 tablet 1  . valACYclovir (VALTREX) 1000 MG tablet TAKE 1 TABLET BY MOUTH TWICE DAILY (Patient taking differently: as needed) 10 tablet 5   No  facility-administered medications prior to visit.    Past Medical History:  Diagnosis Date  . Arthritis   . CAD (coronary artery disease)   . 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 Depression   . Ejection fraction   . GERD (gastroesophageal reflux disease)   . Hyperlipidemia   . Hypertension   . PONV (postoperative nausea and vomiting)   . Skin cancer of face 05/2015   basal cell  . Stroke [facility]) 2006   deaf in right ear   Past Surgical History:  Procedure Laterality Date  . BREAST BIOPSY Left 1984   abcess  . CESAREAN SECTION    . CHOLECYSTECTOMY  1990  . CHONDROPLASTY Left 01/05/2015   Procedure: CHONDROPLASTY;  Surgeon: Yvette Rack., MD;  Location: [place];  Service: Orthopedics;  Laterality: Left;  . KNEE ARTHROSCOPY WITH MEDIAL MENISECTOMY Left 01/05/2015   Procedure: LEFT KNEE ARTHROSCOPY WITH MEDIAL AND LATERAL MENISECTOMY; DEBRIDEMENT LATERAL PATELLA FEMORAL ;  Surgeon: Yvette Rack., MD;  Location: [place];  Service: Orthopedics;  Laterality: Left;  . LEFT HEART CATHETERIZATION WITH CORONARY ANGIOGRAM N/A 10/28/2013   Procedure: LEFT HEART CATHETERIZATION WITH CORONARY ANGIOGRAM;  Surgeon: Larey Dresser, MD;  Location: [facility] CATH LAB;  Service: Cardiovascular;  Laterality: N/A;  . TOTAL KNEE ARTHROPLASTY Left 07/06/2015  Procedure: TOTAL KNEE ARTHROPLASTY;  Surgeon: Earlie Server, MD;  Location: [place];  Service: Orthopedics;  Laterality: Left;  . TUBAL LIGATION     Social History   Social History  . Marital status: Divorced    Spouse name: [name]  . Number of children: 8  . Years of education: N/A   Occupational History  . Works full time at [employer] as [place]  . Smoking status: Never Smoker  . Smokeless tobacco: Never Used     Comment: LIVES WITH 2 SMOKERS   . Alcohol use Yes     Comment: Rare social  ETOH  . Drug use: No  . Sexual activity: Not Asked   Other Topics Concern  . None   Social History Narrative   Divorced, but has fiancee.  Lives with fiancee in [place].  Moved here recently from [place], [place].     She 8 children- ages 37- 59.               Family History  Problem Relation Age of Onset  . Healthy Mother   . Stroke Father   . Heart failure Father   . Ovarian cancer Paternal Grandmother   . Liver cancer Paternal Grandfather   . Colon cancer Neg Hx   . Stomach cancer Neg Hx   . Rectal cancer Neg Hx   . Esophageal cancer Neg Hx      Review of Systems: Pertinent positive and negative review of systems were noted in the above HPI section. All other review of systems were otherwise negative.   Physical Exam: General: Well developed, well nourished, no acute distress Head: Normocephalic and atraumatic Eyes:  sclerae anicteric, EOMI Ears: Normal auditory acuity Mouth: No deformity or lesions Neck: Supple, no masses or thyromegaly Lungs: Clear throughout to auscultation Heart: Regular rate and rhythm; no murmurs, rubs or bruits Abdomen: Soft, non tender and non distended. No masses, hepatosplenomegaly or hernias noted. Normal Bowel sounds Rectal: Deferred to colonoscopy Musculoskeletal: Symmetrical with no gross deformities  Skin: No lesions on visible extremities Pulses:  Normal pulses noted Extremities: No clubbing, cyanosis, edema or deformities noted Neurological: Alert oriented x 4, grossly nonfocal Cervical Nodes:  No significant cervical adenopathy Inguinal Nodes: No significant inguinal adenopathy Psychological:  Alert and cooperative. Normal mood and affect  Assessment and Recommendations:  1. Diarrhea, fecal incontinence, lower abdominal pain, bloating. All symptoms resolved with a change to a lower stress job and dietary changes.   2. GERD. Chronic problem with recent increase in symptoms. Frequent breakthrough symptoms. Increase Nexium to  40 mg twice daily. Intensify antireflux measures. Schedule EGD to evaluate for esophagitis, Barrett's. The risks (including bleeding, perforation, infection, missed lesions, medication reactions and possible hospitalization or surgery if complications occur), benefits, and alternatives to endoscopy with possible biopsy and possible dilation were discussed with the patient and they consent to proceed.   3. CRC screening, average risk.  No prior colonoscopy. Schedule colonoscopy. The risks (including bleeding, perforation, infection, missed lesions, medication reactions and possible hospitalization or surgery if complications occur), benefits, and alternatives to colonoscopy with possible biopsy and possible polypectomy were discussed with the patient and they consent to proceed.    cc: Lucille Passy, MD [address] [place], [place] [id number]

## 2016-12-08 NOTE — Patient Instructions (Signed)
Increase your Nexium to 40 mg twice daily. A new prescription has been sent to your pharmacy.   You have been scheduled for an endoscopy and colonoscopy. Please follow the written instructions given to you at your visit today. Please pick up your prep supplies at the pharmacy within the next 1-3 days. If you use inhalers (even only as needed), please bring them with you on the day of your procedure. Your physician has requested that you go to www.startemmi.com and enter the access code given to you at your visit today. This web site gives a general overview about your procedure. However, you should still follow specific instructions given to you by our office regarding your preparation for the procedure.  Thank you for choosing me and [facility].  Pricilla Riffle. Dagoberto Ligas., MD., Marval Regal

## 2016-12-19 ENCOUNTER — Other Ambulatory Visit: Payer: Self-pay | Admitting: Family Medicine

## 2016-12-19 NOTE — Telephone Encounter (Signed)
Pt called to ck on status of 2 refills; Earl Lagos verified already called in and pt voiced understanding.

## 2016-12-19 NOTE — Telephone Encounter (Signed)
Rx called in to requested pharmacy 

## 2016-12-29 DIAGNOSIS — D126 Benign neoplasm of colon, unspecified: Secondary | ICD-10-CM

## 2016-12-29 HISTORY — DX: Benign neoplasm of colon, unspecified: D12.6

## 2017-01-05 ENCOUNTER — Ambulatory Visit
Admission: RE | Admit: 2017-01-05 | Discharge: 2017-01-05 | Disposition: A | Discharge status: Home / Self Care | Payer: 59 | Source: Ambulatory Visit | Attending: Family Medicine | Admitting: Family Medicine

## 2017-01-05 DIAGNOSIS — Z1231 Encounter for screening mammogram for malignant neoplasm of breast: Secondary | ICD-10-CM

## 2017-01-12 ENCOUNTER — Telehealth: Payer: Self-pay | Admitting: *Deleted

## 2017-01-12 MED ORDER — ALBUTEROL SULFATE HFA 108 (90 BASE) MCG/ACT IN AERS: 1.0000 | INHALATION_SPRAY | Freq: Four times a day (QID) | RESPIRATORY_TRACT | 1 refills | Status: DC | PRN

## 2017-01-12 MED ORDER — HYDROCODONE-HOMATROPINE 5-1.5 MG/5ML PO SYRP: 5.0000 mL | ORAL_SOLUTION | Freq: Three times a day (TID) | ORAL | 0 refills | Status: DC | PRN

## 2017-01-12 NOTE — Telephone Encounter (Signed)
Proair eRx sent to pharmacy on file and Hycodan rx printed.

## 2017-01-12 NOTE — Telephone Encounter (Signed)
Lm on pts vm and informed her Rx is available for pickup from the front desk 

## 2017-01-12 NOTE — Telephone Encounter (Signed)
Patient left a voicemail stating that her insurance company will no longer cover Proventil. Patient stated that they will cover Proair or Ventolin and needs a new script sent in. Patient stated that she needs a refill on her cough medication with codeine also. Not on medication list. Pharmacy [facility] Last office visit 11/24/16

## 2017-01-19 ENCOUNTER — Other Ambulatory Visit: Payer: Self-pay | Admitting: Family Medicine

## 2017-01-19 NOTE — Telephone Encounter (Signed)
Last f/u 10/2016-CPE

## 2017-01-20 NOTE — Telephone Encounter (Signed)
Rx called in to requested pharmacy 

## 2017-01-23 ENCOUNTER — Encounter: Payer: Self-pay | Admitting: Gastroenterology

## 2017-01-27 ENCOUNTER — Telehealth: Payer: Self-pay | Admitting: Family Medicine

## 2017-01-27 NOTE — Telephone Encounter (Signed)
Yes she does need to be seen.

## 2017-01-27 NOTE — Telephone Encounter (Signed)
Spoke to pt and advised per [name]. F/u appt scheduled.

## 2017-01-27 NOTE — Telephone Encounter (Signed)
Pt called because she is still cough, congestion, using her inhaler and has night sweats and achey now.  She wants to know if she should come in for an appt or if she should wait it out.  She is concerned because she is scheduled for a colonoscopy and endoscopy on Thursday 2/8 and doesn't want to be sick for the studies.  She needs to be better in time to get these done.  SHe called off work yesterday and doesn't normally do that.

## 2017-01-28 ENCOUNTER — Encounter: Payer: Self-pay | Admitting: Family Medicine

## 2017-01-28 ENCOUNTER — Encounter (INDEPENDENT_AMBULATORY_CARE_PROVIDER_SITE_OTHER): Payer: Self-pay

## 2017-01-28 ENCOUNTER — Ambulatory Visit (INDEPENDENT_AMBULATORY_CARE_PROVIDER_SITE_OTHER): Payer: 59 | Admitting: Family Medicine

## 2017-01-28 VITALS — BP 142/88 | HR 75 | Temp 97.5°F | Wt 211.5 lb

## 2017-01-28 DIAGNOSIS — J0110 Acute frontal sinusitis, unspecified: Secondary | ICD-10-CM

## 2017-01-28 MED ORDER — HYDROCOD POLST-CPM POLST ER 10-8 MG/5ML PO SUER: 5.0000 mL | Freq: Two times a day (BID) | ORAL | 0 refills | Status: DC | PRN

## 2017-01-28 MED ORDER — AZITHROMYCIN 250 MG PO TABS: ORAL_TABLET | ORAL | 0 refills | Status: DC

## 2017-01-28 NOTE — Progress Notes (Signed)
SUBJECTIVE:  Diane [name] is a 66 y.o. female who complains of coryza, congestion and productive cough for 14 days. She denies a history of anorexia and chest pain and admits to a history of asthma. Patient denies smoke cigarettes.   Current Outpatient Prescriptions on File Prior to Visit  Medication Sig Dispense Refill  . albuterol (PROAIR HFA) 108 (90 Base) MCG/ACT inhaler Inhale 1-2 puffs into the lungs every 6 (six) hours as needed for wheezing or shortness of breath. 1 Inhaler 1  . benazepril-hydrochlorthiazide (LOTENSIN HCT) 20-25 MG tablet TAKE 1 TABLET BY MOUTH  EVERY DAY 90 tablet 1  . butalbital-acetaminophen-caffeine (FIORICET WITH CODEINE) 50-325-40-30 MG capsule TAKE 1-2 CAPSULES BY MOUTH EVERY DAY 30 capsule 0  . cholecalciferol (VITAMIN D) 1000 UNITS tablet Take 1,000 Units by mouth daily.    . diazepam (VALIUM) 5 MG tablet TAKE 1/2 TO 1 TABLET BY MOUTH EVERY 12 HOURS AS NEEDED FOR ANXIETY 60 tablet 0  . esomeprazole (NEXIUM) 40 MG capsule Take 1 capsule (40 mg total) by mouth 2 (two) times daily before a meal. 60 capsule 5  . ezetimibe (ZETIA) 10 MG tablet TAKE 1 TABLET BY MOUTH  DAILY 90 tablet 1  . pravastatin (PRAVACHOL) 40 MG tablet Take 1 tablet by mouth  every day 90 tablet 3  . sertraline (ZOLOFT) 25 MG tablet TAKE 1 TABLET BY MOUTH  DAILY 90 tablet 1  . valACYclovir (VALTREX) 1000 MG tablet TAKE 1 TABLET BY MOUTH TWICE DAILY (Patient taking differently: as needed) 10 tablet 5   No current facility-administered medications on file prior to visit.     No Known Allergies  Past Medical History:  Diagnosis Date  . Arthritis   . CAD (coronary artery disease)   . 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 Depression   . Ejection fraction   . GERD (gastroesophageal reflux disease)   . Hyperlipidemia   . Hypertension   . PONV (postoperative nausea and vomiting)   . Skin cancer of face  05/2015   basal cell  . Stroke [facility]) 2006   deaf in right ear    Past Surgical History:  Procedure Laterality Date  . BREAST BIOPSY Left 1984   abcess  . CESAREAN SECTION    . CHOLECYSTECTOMY  1990  . CHONDROPLASTY Left 01/05/2015   Procedure: CHONDROPLASTY;  Surgeon: Yvette Rack., MD;  Location: [place];  Service: Orthopedics;  Laterality: Left;  . KNEE ARTHROSCOPY WITH MEDIAL MENISECTOMY Left 01/05/2015   Procedure: LEFT KNEE ARTHROSCOPY WITH MEDIAL AND LATERAL MENISECTOMY; DEBRIDEMENT LATERAL PATELLA FEMORAL ;  Surgeon: Yvette Rack., MD;  Location: [place];  Service: Orthopedics;  Laterality: Left;  . LEFT HEART CATHETERIZATION WITH CORONARY ANGIOGRAM N/A 10/28/2013   Procedure: LEFT HEART CATHETERIZATION WITH CORONARY ANGIOGRAM;  Surgeon: Larey Dresser, MD;  Location: [facility] CATH LAB;  Service: Cardiovascular;  Laterality: N/A;  . TOTAL KNEE ARTHROPLASTY Left 07/06/2015   Procedure: TOTAL KNEE ARTHROPLASTY;  Surgeon: Earlie Server, MD;  Location: [place];  Service: Orthopedics;  Laterality: Left;  . TUBAL LIGATION      Family History  Problem Relation Age of Onset  . Healthy Mother   . Stroke Father   . Heart failure Father   . Ovarian cancer Paternal Grandmother   . Liver cancer Paternal Grandfather   . Colon cancer Neg  Hx   . Stomach cancer Neg Hx   . Rectal cancer Neg Hx   . Esophageal cancer Neg Hx   . Breast cancer Neg Hx     Social History   Social History  . Marital status: Divorced    Spouse name: [name]  . Number of children: 8  . Years of education: N/A   Occupational History  . Works full time at [employer] as [place]  . Smoking status: Never Smoker  . Smokeless tobacco: Never Used     Comment: LIVES WITH 2 SMOKERS   . Alcohol use Yes     Comment: Rare social ETOH  . Drug use: No  . Sexual activity: Not on file   Other Topics Concern  . Not on file   Social History  Narrative   Divorced, but has fiancee.  Lives with fiancee in [place].  Moved here recently from [place], [place].     She 8 children- ages 49- 75.               The PMH, PSH, Social History, Family History, Medications, and allergies have been reviewed in [facility], and have been updated if relevant.  OBJECTIVE: BP (!) 142/88   Pulse 75   Temp 97.5 F (36.4 C) (Oral)   Wt 211 lb 8 oz (95.9 kg)   SpO2 98%   BMI 38.37 kg/m   She appears well, vital signs are as noted. Ears normal.  Throat and pharynx normal.  Neck supple. No adenopathy in the neck. Nose is congested. Sinuses  tender. The chest is clear, without wheezes or rales.  ASSESSMENT:  sinusitis  PLAN: Given duration and progression of symptoms, will treat for bacterial sinusitis.  Symptomatic therapy suggested: push fluids, rest and return office visit prn if symptoms persist or worsen.Call or return to clinic prn if these symptoms worsen or fail to improve as anticipated.

## 2017-01-28 NOTE — Progress Notes (Signed)
Pre visit review using our clinic review tool, if applicable. No additional management support is needed unless otherwise documented below in the visit note. 

## 2017-02-05 ENCOUNTER — Ambulatory Visit (AMBULATORY_SURGERY_CENTER): Payer: 59 | Admitting: Gastroenterology

## 2017-02-05 ENCOUNTER — Encounter: Payer: Self-pay | Admitting: Gastroenterology

## 2017-02-05 VITALS — BP 127/69 | HR 73 | Temp 98.2°F | Resp 14 | Ht 62.0 in | Wt 205.0 lb

## 2017-02-05 DIAGNOSIS — D122 Benign neoplasm of ascending colon: Secondary | ICD-10-CM

## 2017-02-05 DIAGNOSIS — K219 Gastro-esophageal reflux disease without esophagitis: Secondary | ICD-10-CM

## 2017-02-05 DIAGNOSIS — Z1211 Encounter for screening for malignant neoplasm of colon: Secondary | ICD-10-CM

## 2017-02-05 DIAGNOSIS — Z1212 Encounter for screening for malignant neoplasm of rectum: Secondary | ICD-10-CM

## 2017-02-05 DIAGNOSIS — D123 Benign neoplasm of transverse colon: Secondary | ICD-10-CM

## 2017-02-05 MED ORDER — FLUCONAZOLE 100 MG PO TABS: 100.0000 mg | ORAL_TABLET | Freq: Every day | ORAL | 0 refills | Status: AC

## 2017-02-05 MED ORDER — SODIUM CHLORIDE 0.9 % IV SOLN: 500.0000 mL | INTRAVENOUS | Status: DC

## 2017-02-05 NOTE — Op Note (Signed)
[facility] Patient Name: Diane [name] Procedure Date: 02/05/2017 1:52 PM MRN: [id number] Endoscopist: Ladene Artist , MD Age: 66 Referring MD:  Date of Birth: [birth year] Gender: Female Account #: [id number] Procedure:                Upper GI endoscopy Indications:              Screening for Barrett's esophagus in patient at                            risk for this condition, Gastro-esophageal reflux                            disease Medicines:                Monitored Anesthesia Care Procedure:                Pre-Anesthesia Assessment:                           - Prior to the procedure, a History and Physical                            was performed, and patient medications and                            allergies were reviewed. The patient's tolerance of                            previous anesthesia was also reviewed. The risks                            and benefits of the procedure and the sedation                            options and risks were discussed with the patient.                            All questions were answered, and informed consent                            was obtained. Prior Anticoagulants: The patient has                            taken no previous anticoagulant or antiplatelet                            agents. ASA Grade Assessment: III - A patient with                            severe systemic disease. After reviewing the risks                            and benefits, the patient was deemed in  satisfactory condition to undergo the procedure.                           After obtaining informed consent, the endoscope was                            passed under direct vision. Throughout the                            procedure, the patient's blood pressure, pulse, and                            oxygen saturations were monitored continuously. The                            Model GIF-HQ190 [id number]) scope was  introduced                            through the mouth, and advanced to the second part                            of duodenum. The upper GI endoscopy was                            accomplished without difficulty. The patient                            tolerated the procedure well. Scope In: Scope Out: Findings:                 Localized candidiasis was found in the distal                            esophagus.                           The exam of the esophagus was otherwise normal.                           The entire examined stomach was normal.                           The duodenal bulb and second portion of the                            duodenum were normal. Complications:            No immediate complications. Estimated Blood Loss:     Estimated blood loss: none. Impression:               - Monilial esophagitis.                           - Normal stomach.                           - Normal duodenal bulb and second portion of the  duodenum.                           - No specimens collected. Recommendation:           - Patient has a contact number available for                            emergencies. The signs and symptoms of potential                            delayed complications were discussed with the                            patient. Return to normal activities tomorrow.                            Written discharge instructions were provided to the                            patient.                           - Resume previous diet.                           - Continue present medications.                           - Diflucan (fluconazole) 100 mg PO daily for 5 days. Ladene Artist, MD 02/05/2017 2:25:26 PM This report has been signed electronically.

## 2017-02-05 NOTE — Progress Notes (Signed)
A and O x3. Report to RN. Tolerated MAC anesthesia well.Teeth unchanged after procedure.

## 2017-02-05 NOTE — Op Note (Signed)
[facility] Patient Name: Diane [name] Procedure Date: 02/05/2017 1:52 PM MRN: [id number] Endoscopist: Ladene Artist , MD Age: 66 Referring MD:  Date of Birth: 04-Apr-1951 Gender: Female Account #: [id number] Procedure:                Colonoscopy Indications:              Screening for colorectal malignant neoplasm Medicines:                Monitored Anesthesia Care Procedure:                Pre-Anesthesia Assessment:                           - Prior to the procedure, a History and Physical                            was performed, and patient medications and                            allergies were reviewed. The patient's tolerance of                            previous anesthesia was also reviewed. The risks                            and benefits of the procedure and the sedation                            options and risks were discussed with the patient.                            All questions were answered, and informed consent                            was obtained. Prior Anticoagulants: The patient has                            taken no previous anticoagulant or antiplatelet                            agents. ASA Grade Assessment: III - A patient with                            severe systemic disease. After reviewing the risks                            and benefits, the patient was deemed in                            satisfactory condition to undergo the procedure.                           After obtaining informed consent, the colonoscope  was passed under direct vision. Throughout the                            procedure, the patient's blood pressure, pulse, and                            oxygen saturations were monitored continuously. The                            Model [id number]-[id number]) scope was introduced                            through the anus and advanced to the the cecum,                            identified  by appendiceal orifice and ileocecal                            valve. The ileocecal valve, appendiceal orifice,                            and rectum were photographed. The quality of the                            bowel preparation was adequate. The colonoscopy was                            performed without difficulty. The patient tolerated                            the procedure well. Scope In: 1:59:26 PM Scope Out: 2:14:14 PM Scope Withdrawal Time: 0 hours 13 minutes 26 seconds  Total Procedure Duration: 0 hours 14 minutes 48 seconds  Findings:                 The perianal and digital rectal examinations were                            normal.                           A 12 mm polyp was found in the ascending colon. The                            polyp was sessile. The polyp was removed with a hot                            snare. Resection and retrieval were complete.                           Four sessile polyps were found in the transverse                            colon and ascending colon. The polyps were 4 to 5  mm in size. These polyps were removed with a cold                            biopsy forceps. Resection and retrieval were                            complete.                           A 7 mm polyp was found in the ascending colon. The                            polyp was semi-pedunculated. The polyp was removed                            with a cold snare. Resection and retrieval were                            complete.                           Multiple small-mouthed diverticula were found in                            the sigmoid colon.                           The exam was otherwise without abnormality on                            direct and retroflexion views. Complications:            No immediate complications. Estimated blood loss:                            None. Estimated Blood Loss:     Estimated blood loss:  none. Impression:               - One 12 mm polyp in the ascending colon, removed                            with a hot snare. Resected and retrieved.                           - Four 4 to 5 mm polyps in the transverse colon and                            in the ascending colon, removed with a cold biopsy                            forceps. Resected and retrieved.                           - One 7 mm polyp in the ascending colon, removed  with a cold snare. Resected and retrieved.                           - Diverticulosis in the sigmoid colon.                           - The examination was otherwise normal on direct                            and retroflexion views. Recommendation:           - Repeat colonoscopy 3-5 years, date to be                            determined after pending pathology results are                            reviewed, for surveillance.                           - Patient has a contact number available for                            emergencies. The signs and symptoms of potential                            delayed complications were discussed with the                            patient. Return to normal activities tomorrow.                            Written discharge instructions were provided to the                            patient.                           - Resume previous diet.                           - Continue present medications.                           - Await pathology results.                           - No aspirin, ibuprofen, naproxen, or other                            non-steroidal anti-inflammatory drugs for 2 weeks                            after polyp removal. Ladene Artist, MD 02/05/2017 2:22:40 PM This report has been signed electronically.

## 2017-02-05 NOTE — Patient Instructions (Signed)
NO ASPIRIN, ASPIRIN PRODUCTS OR NSAIDS (MOTRIN,SIBUPROFEN, ADVIL, ALEVE, NAPROSYN) FOR 2 WEEKS.[place] 22,2018  HANDOUTS :POLYPS.   YOU HAD AN ENDOSCOPIC PROCEDURE TODAY AT [facility]:   Refer to the procedure report that was given to you for any specific questions about what was found during the examination.  If the procedure report does not answer your questions, please call your gastroenterologist to clarify.  If you requested that your care partner not be given the details of your procedure findings, then the procedure report has been included in a sealed envelope for you to review at your convenience later.  YOU SHOULD EXPECT: Some feelings of bloating in the abdomen. Passage of more gas than usual.  Walking can help get rid of the air that was put into your GI tract during the procedure and reduce the bloating. If you had a lower endoscopy (such as a colonoscopy or flexible sigmoidoscopy) you may notice spotting of blood in your stool or on the toilet paper. If you underwent a bowel prep for your procedure, you may not have a normal bowel movement for a few days.  Please Note:  You might notice some irritation and congestion in your nose or some drainage.  This is from the oxygen used during your procedure.  There is no need for concern and it should clear up in a day or so.  SYMPTOMS TO REPORT IMMEDIATELY:   Following lower endoscopy (colonoscopy or flexible sigmoidoscopy):  Excessive amounts of blood in the stool  Significant tenderness or worsening of abdominal pains  Swelling of the abdomen that is new, acute  Fever of 100F or higher   Following upper endoscopy (EGD)  Vomiting of blood or coffee ground material  New chest pain or pain under the shoulder blades  Painful or persistently difficult swallowing  New shortness of breath  Fever of 100F or higher  Black, tarry-looking stools  For urgent or emergent issues, a gastroenterologist can be reached at any  hour by calling [phone].   DIET:  We do recommend a small meal at first, but then you may proceed to your regular diet.  Drink plenty of fluids but you should avoid alcoholic beverages for 24 hours.  ACTIVITY:  You should plan to take it easy for the rest of today and you should NOT DRIVE or use heavy machinery until tomorrow (because of the sedation medicines used during the test).    FOLLOW UP: Our staff will call the number listed on your records the next business day following your procedure to check on you and address any questions or concerns that you may have regarding the information given to you following your procedure. If we do not reach you, we will leave a message.  However, if you are feeling well and you are not experiencing any problems, there is no need to return our call.  We will assume that you have returned to your regular daily activities without incident.  If any biopsies were taken you will be contacted by phone or by letter within the next 1-3 weeks.  Please call us at [phone] if you have not heard about the biopsies in 3 weeks.    SIGNATURES/CONFIDENTIALITY: You and/or your care partner have signed paperwork which will be entered into your electronic medical record.  These signatures attest to the fact that that the information above on your After Visit Summary has been reviewed and is understood.  Full responsibility of the confidentiality of this discharge  information lies with you and/or your care-partner. 

## 2017-02-05 NOTE — Progress Notes (Signed)
Pt's states no medical or surgical changes since previsit or office visit. 

## 2017-02-05 NOTE — Progress Notes (Signed)
Called to room to assist during endoscopic procedure.  Patient ID and intended procedure confirmed with present staff. Received instructions for my participation in the procedure from the performing physician.  

## 2017-02-06 ENCOUNTER — Telehealth: Payer: Self-pay | Admitting: *Deleted

## 2017-02-06 NOTE — Telephone Encounter (Signed)
  Follow up Call-  Call back number 02/05/2017 02/05/2017  Post procedure Call Back phone  # [phone] -  Permission to leave phone message Yes No  Some recent data might be hidden     Patient questions:  Do you have a fever, pain , or abdominal swelling? No. Pain Score  0 *  Have you tolerated food without any problems? Yes.    Have you been able to return to your normal activities? Yes.    Do you have any questions about your discharge instructions: Diet   No. Medications  No. Follow up visit  No.  Do you have questions or concerns about your Care? No.  Actions: * If pain score is 4 or above: No action needed, pain <4.

## 2017-02-17 ENCOUNTER — Encounter: Payer: Self-pay | Admitting: Gastroenterology

## 2017-02-19 ENCOUNTER — Other Ambulatory Visit: Payer: Self-pay | Admitting: Family Medicine

## 2017-02-19 NOTE — Telephone Encounter (Signed)
Left refill on voice mail at pharmacy  

## 2017-02-19 NOTE — Telephone Encounter (Signed)
Diazepam last filled 01-20-17 #60 Fioricet last filled 01-20-17 #30  Last OV CPE 11-24-16 No Future OV

## 2017-03-19 ENCOUNTER — Other Ambulatory Visit: Payer: Self-pay | Admitting: Family Medicine

## 2017-03-19 NOTE — Telephone Encounter (Signed)
Pt last seen 01/29/2016.Diane KitchenMarland KitchenMarland [name]

## 2017-03-20 ENCOUNTER — Other Ambulatory Visit: Payer: Self-pay | Admitting: Family Medicine

## 2017-03-20 NOTE — Telephone Encounter (Signed)
Spoke with [facility] city and they do not have refill for diazepam and fioricet with codeine.Medication phoned to [facility] as instructed.

## 2017-03-20 NOTE — Telephone Encounter (Signed)
Done by Dr. Deborra Medina

## 2017-03-23 ENCOUNTER — Encounter: Payer: Self-pay | Admitting: Internal Medicine

## 2017-03-23 ENCOUNTER — Telehealth: Payer: Self-pay | Admitting: Family Medicine

## 2017-03-23 ENCOUNTER — Encounter (INDEPENDENT_AMBULATORY_CARE_PROVIDER_SITE_OTHER): Payer: Self-pay

## 2017-03-23 ENCOUNTER — Ambulatory Visit (INDEPENDENT_AMBULATORY_CARE_PROVIDER_SITE_OTHER): Payer: 59 | Admitting: Internal Medicine

## 2017-03-23 ENCOUNTER — Ambulatory Visit (INDEPENDENT_AMBULATORY_CARE_PROVIDER_SITE_OTHER)
Admission: RE | Admit: 2017-03-23 | Discharge: 2017-03-23 | Disposition: A | Discharge status: Home / Self Care | Payer: 59 | Source: Ambulatory Visit | Attending: Internal Medicine | Admitting: Internal Medicine

## 2017-03-23 VITALS — BP 122/76 | HR 86 | Temp 98.3°F | Wt 216.5 lb

## 2017-03-23 DIAGNOSIS — R05 Cough: Secondary | ICD-10-CM | POA: Diagnosis not present

## 2017-03-23 DIAGNOSIS — R059 Cough, unspecified: Secondary | ICD-10-CM

## 2017-03-23 DIAGNOSIS — R0602 Shortness of breath: Secondary | ICD-10-CM

## 2017-03-23 NOTE — Telephone Encounter (Signed)
Patient Name: Diane [name] DOB: 11/23/1951 Initial Comment Caller has had bronchitis twice this year. Having to use inhaler more than normal, having breathing problems. Hearing whistles Nurse Assessment Nurse: Jimmye Norman, RN, [place] Date/Time (Eastern Time): 03/23/2017 9:08:18 AM Confirm and document reason for call. If symptomatic, describe symptoms. ---Caller has had bronchitis twice this year. Having to use inhaler more than normal every 4 hours instead of every 6 hours. Hearing whistles, over the weekend. didn't go to work this morning because she is having trouble breathing. Does the patient have any new or worsening symptoms? ---Yes Will a triage be completed? ---Yes Related visit to physician within the last 2 weeks? ---No Does the PT have any chronic conditions? (i.e. diabetes, asthma, etc.) ---Yes List chronic conditions. ---hypertension, high cholesterol Is this a behavioral health or substance abuse call? ---No Guidelines Guideline Title Affirmed Question Affirmed Notes Asthma Attack Asthma medicine (nebulizer or inhaler) is needed more frequently than q 4 hours to keep you comfortable Final Disposition User Go to ED Now (or PCP triage) Jimmye Norman, RN, [place] Comments Appt scheduled for today at 12pm with Webb Silversmith, NP Referrals REFERRED TO PCP OFFICE Disagree/Comply: Comply

## 2017-03-23 NOTE — Progress Notes (Signed)
HPI  Pt presents to the clinic today with c/o cough, wheezing and shortness of breath. She reports this started 2 weeks ago. The cough is productive of yellow mucous. The SOB is worse with coughing spells or activity. She denies fever, chills or body aches. She has tried Albuterol, sinus rinse, Advil cold and sinus without any relief. She has not had sick contacts that she is aware of. Her flu shot is UTD.  Review of Systems        Past Medical History:  Diagnosis Date  . Arthritis   . CAD (coronary artery disease)   . 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 Depression   . Ejection fraction   . GERD (gastroesophageal reflux disease)   . Hyperlipidemia   . Hypertension   . PONV (postoperative nausea and vomiting)   . Skin cancer of face 05/2015   basal cell  . Stroke [facility]) 2006   deaf in right ear    Family History  Problem Relation Age of Onset  . Healthy Mother   . Stroke Father   . Heart failure Father   . Ovarian cancer Paternal Grandmother   . Liver cancer Paternal Grandfather   . Colon cancer Neg Hx   . Stomach cancer Neg Hx   . Rectal cancer Neg Hx   . Esophageal cancer Neg Hx   . Breast cancer Neg Hx     Social History   Social History  . Marital status: Divorced    Spouse name: [name]  . Number of children: 8  . Years of education: N/A   Occupational History  . Works full time at [employer] as [place]  . Smoking status: Never Smoker  . Smokeless tobacco: Never Used     Comment: LIVES WITH 2 SMOKERS   . Alcohol use Yes     Comment: Rare social ETOH  . Drug use: No  . Sexual activity: Not on file   Other Topics Concern  . Not on file   Social History Narrative   Divorced, but has fiancee.  Lives with fiancee in [place].  Moved here recently from [place], [place].     She 8 children- ages 32- 59.                No Known  Allergies   Constitutional: Denies headache, fatigue, fever or abrupt weight changes.  HEENT:  Denies eye redness, eye pain, pressure behind the eyes, facial pain, nasal congestion, ear pain, ringing in the ears, wax buildup, runny nose or sore throat Respiratory: Positive cough and shortness of breath. Denies difficulty breathing.  Cardiovascular: Denies chest pain, chest tightness, palpitations or swelling in the hands or feet.   No other specific complaints in a complete review of systems (except as listed in HPI above).  Objective:   BP 122/76   Pulse 86   Temp 98.3 F (36.8 C) (Oral)   Wt 216 lb 8 oz (98.2 kg)   SpO2 97%   BMI 39.60 kg/m   Wt Readings from Last 3 Encounters:  03/23/17 216 lb 8 oz (98.2 kg)  02/05/17 205 lb (93 kg)  01/28/17 211 lb 8 oz (95.9 kg)     General: Appears her stated age, in NAD. HEENT: Throat/Mouth: Teeth present, mucosa pink and moist, no exudate noted, no lesions or ulcerations  noted.  Neck: No cervical lymphadenopathy.  Cardiovascular: Normal rate and rhythm. ? murmur noted Pulmonary/Chest: Normal effort and positive vesicular breath sounds. No respiratory distress. No wheezes, rales or ronchi noted.       Assessment & Plan:   Cough and SOB:  Exam benign Chest xray, CBC and CMET today Offered RX for Tessalon, she reports she already has some Continue Albuterol prn  Will follow up after labs and xrays, RTC as needed or if symptoms persist.   Webb Silversmith, NP

## 2017-03-23 NOTE — Patient Instructions (Signed)
Cough, Adult  A cough helps to clear your throat and lungs. A cough may last only 2?3 weeks (acute), or it may last longer than 8 weeks (chronic). Many different things can cause a cough. A cough may be a sign of an illness or another medical condition.  Follow these instructions at home:  ? Pay attention to any changes in your cough.  ? Take medicines only as told by your doctor.  ? If you were prescribed an antibiotic medicine, take it as told by your doctor. Do not stop taking it even if you start to feel better.  ? Talk with your doctor before you try using a cough medicine.  ? Drink enough fluid to keep your pee (urine) clear or pale yellow.  ? If the air is dry, use a cold steam vaporizer or humidifier in your home.  ? Stay away from things that make you cough at work or at home.  ? If your cough is worse at night, try using extra pillows to raise your head up higher while you sleep.  ? Do not smoke, and try not to be around smoke. If you need help quitting, ask your doctor.  ? Do not have caffeine.  ? Do not drink alcohol.  ? Rest as needed.  Contact a doctor if:  ? You have new problems (symptoms).  ? You cough up yellow fluid (pus).  ? Your cough does not get better after 2?3 weeks, or your cough gets worse.  ? Medicine does not help your cough and you are not sleeping well.  ? You have pain that gets worse or pain that is not helped with medicine.  ? You have a fever.  ? You are losing weight and you do not know why.  ? You have night sweats.  Get help right away if:  ? You cough up blood.  ? You have trouble breathing.  ? Your heartbeat is very fast.  This information is not intended to replace advice given to you by your health care provider. Make sure you discuss any questions you have with your health care provider.  Document Released: 08/28/2011 Document Revised: 05/22/2016 Document Reviewed: 02/21/2015  Elsevier Interactive Patient Education ? 2017 Elsevier Inc.

## 2017-03-23 NOTE — Telephone Encounter (Signed)
Pt does not need to go to ED, will see her shortly at [facility].

## 2017-03-23 NOTE — Telephone Encounter (Signed)
Pt has appt 03/23/17 at 12 noon with Avie Echevaria NP.

## 2017-03-23 NOTE — Addendum Note (Signed)
Addended by: Ellamae Sia on: 03/23/2017 12:27 PM   Modules accepted: Orders

## 2017-03-24 ENCOUNTER — Telehealth: Payer: Self-pay | Admitting: Internal Medicine

## 2017-03-24 NOTE — Telephone Encounter (Signed)
Patient called to find out the results of her lab work.

## 2017-03-24 NOTE — Telephone Encounter (Signed)
Her labs have not come back. We will call her once the do.

## 2017-03-25 LAB — COMPREHENSIVE METABOLIC PANEL
A/G RATIO: 1.6 (ref 1.2–2.2)
ALBUMIN: 4.1 g/dL (ref 3.6–4.8)
ALK PHOS: 67 IU/L (ref 39–117)
ALT: 30 IU/L (ref 0–32)
AST: 18 IU/L (ref 0–40)
BILIRUBIN TOTAL: 0.2 mg/dL (ref 0.0–1.2)
BUN / CREAT RATIO: 18 (ref 12–28)
BUN: 13 mg/dL (ref 8–27)
CHLORIDE: 97 mmol/L (ref 96–106)
CO2: 29 mmol/L (ref 18–29)
Calcium: 9.1 mg/dL (ref 8.7–10.3)
Creatinine, Ser: 0.71 mg/dL (ref 0.57–1.00)
GFR calc Af Amer: 104 mL/min/{1.73_m2} (ref >59)
GFR calc non Af Amer: 90 mL/min/{1.73_m2} (ref >59)
GLOBULIN, TOTAL: 2.5 g/dL (ref 1.5–4.5)
Glucose: 110 mg/dL — ABNORMAL HIGH (ref 65–99)
Potassium: 4.1 mmol/L (ref 3.5–5.2)
SODIUM: 141 mmol/L (ref 134–144)
Total Protein: 6.6 g/dL (ref 6.0–8.5)

## 2017-03-25 LAB — CBC WITH DIFFERENTIAL/PLATELET
BASOS ABS: 0 10*3/uL (ref 0.0–0.2)
Basos: 0 %
EOS (ABSOLUTE): 0.3 10*3/uL (ref 0.0–0.4)
EOS: 5 %
HEMATOCRIT: 40.3 % (ref 34.0–46.6)
Hemoglobin: 13.5 g/dL (ref 11.1–15.9)
Immature Grans (Abs): 0 10*3/uL (ref 0.0–0.1)
Immature Granulocytes: 0 %
LYMPHS ABS: 1.6 10*3/uL (ref 0.7–3.1)
Lymphs: 23 %
MCH: 33.2 pg — ABNORMAL HIGH (ref 26.6–33.0)
MCHC: 33.5 g/dL (ref 31.5–35.7)
MCV: 99 fL — AB (ref 79–97)
MONOS ABS: 0.5 10*3/uL (ref 0.1–0.9)
Monocytes: 7 %
Neutrophils Absolute: 4.5 10*3/uL (ref 1.4–7.0)
Neutrophils: 65 %
Platelets: 190 10*3/uL (ref 150–379)
RBC: 4.07 x10E6/uL (ref 3.77–5.28)
RDW: 13.3 % (ref 12.3–15.4)
WBC: 6.9 10*3/uL (ref 3.4–10.8)

## 2017-03-25 NOTE — Telephone Encounter (Signed)
Pt returned your call. She asked for you to leave a vm if she doesn't answer since she cannot have a phone at her job.

## 2017-03-27 ENCOUNTER — Emergency Department
Admission: EM | Admit: 2017-03-27 | Discharge: 2017-03-27 | Disposition: A | Discharge status: Home / Self Care | Payer: 59 | Attending: Emergency Medicine | Admitting: Emergency Medicine

## 2017-03-27 ENCOUNTER — Encounter: Payer: Self-pay | Admitting: Emergency Medicine

## 2017-03-27 ENCOUNTER — Emergency Department: Payer: 59

## 2017-03-27 DIAGNOSIS — R51 Headache: Secondary | ICD-10-CM | POA: Diagnosis not present

## 2017-03-27 DIAGNOSIS — R05 Cough: Secondary | ICD-10-CM | POA: Insufficient documentation

## 2017-03-27 DIAGNOSIS — I2510 Atherosclerotic heart disease of native coronary artery without angina pectoris: Secondary | ICD-10-CM | POA: Insufficient documentation

## 2017-03-27 DIAGNOSIS — I10 Essential (primary) hypertension: Secondary | ICD-10-CM | POA: Insufficient documentation

## 2017-03-27 DIAGNOSIS — Z79899 Other long term (current) drug therapy: Secondary | ICD-10-CM | POA: Insufficient documentation

## 2017-03-27 DIAGNOSIS — R079 Chest pain, unspecified: Secondary | ICD-10-CM | POA: Insufficient documentation

## 2017-03-27 LAB — CBC
HCT: 43.1 % (ref 35.0–47.0)
HEMOGLOBIN: 14.9 g/dL (ref 12.0–16.0)
MCH: 33.5 pg (ref 26.0–34.0)
MCHC: 34.7 g/dL (ref 32.0–36.0)
MCV: 96.6 fL (ref 80.0–100.0)
Platelets: 198 10*3/uL (ref 150–440)
RBC: 4.46 MIL/uL (ref 3.80–5.20)
RDW: 12.7 % (ref 11.5–14.5)
WBC: 7.6 10*3/uL (ref 3.6–11.0)

## 2017-03-27 LAB — COMPREHENSIVE METABOLIC PANEL
ALBUMIN: 4.4 g/dL (ref 3.5–5.0)
ALK PHOS: 61 U/L (ref 38–126)
ALT: 30 U/L (ref 14–54)
AST: 28 U/L (ref 15–41)
Anion gap: 9 (ref 5–15)
BUN: 12 mg/dL (ref 6–20)
CALCIUM: 9.9 mg/dL (ref 8.9–10.3)
CO2: 28 mmol/L (ref 22–32)
CREATININE: 0.51 mg/dL (ref 0.44–1.00)
Chloride: 100 mmol/L — ABNORMAL LOW (ref 101–111)
GFR calc Af Amer: >60 mL/min (ref >60)
GFR calc non Af Amer: >60 mL/min (ref >60)
GLUCOSE: 103 mg/dL — AB (ref 65–99)
POTASSIUM: 3.6 mmol/L (ref 3.5–5.1)
Sodium: 137 mmol/L (ref 135–145)
TOTAL PROTEIN: 7.4 g/dL (ref 6.5–8.1)
Total Bilirubin: 0.6 mg/dL (ref 0.3–1.2)

## 2017-03-27 LAB — TROPONIN I: Troponin I: <0.03 ng/mL (ref <0.03)

## 2017-03-27 MED ORDER — GUAIFENESIN-CODEINE 100-10 MG/5ML PO SOLN: 5.0000 mL | Freq: Four times a day (QID) | ORAL | 0 refills | Status: DC | PRN

## 2017-03-27 MED ORDER — FUROSEMIDE 40 MG PO TABS
20.0000 mg | ORAL_TABLET | Freq: Once | ORAL | Status: AC
  Administered 2017-03-27: 20 mg via ORAL
  Filled 2017-03-27: qty 1

## 2017-03-27 MED ORDER — FUROSEMIDE 20 MG PO TABS: 20.0000 mg | ORAL_TABLET | Freq: Every day | ORAL | 0 refills | Status: DC

## 2017-03-27 MED ORDER — ACETAMINOPHEN 500 MG PO TABS
1000.0000 mg | ORAL_TABLET | Freq: Once | ORAL | Status: AC
  Administered 2017-03-27: 1000 mg via ORAL
  Filled 2017-03-27: qty 2

## 2017-03-27 MED ORDER — HYDROCOD POLST-CPM POLST ER 10-8 MG/5ML PO SUER
5.0000 mL | Freq: Once | ORAL | Status: AC
  Administered 2017-03-27: 5 mL via ORAL
  Filled 2017-03-27: qty 5

## 2017-03-27 NOTE — ED Provider Notes (Signed)
[facility] Emergency Department Provider Note  Time seen: 4:25 PM  I have reviewed the triage vital signs and the nursing notes.   HISTORY  Chief Complaint Chest Pain    HPI Diane [name] is a 66 y.o. female with a past medical history of CAD, CVA, gastric reflux, hypertension, presents to the emergency department with cough headache and chest pain. According to the patient for the past 3 or 4 months she has been experiencing a fairly frequent cough. She has been seen by her primary care doctor multiple times place on multiple rounds of antibiotics for presumed bronchitis. Patient states the cough will improve at times and then get worse again. She states for the past 2-3 days she has had a fairly frequent cough. This morning she developed some chest pain associated with the cough as well as a headache which worsens every time she coughs. Denies any focal weakness or numbness, nausea or vomiting, or diaphoresis. Patient does state mild shortness of breath. She also states over the past several weeks she has noted increased lower extremity swelling bilaterally. Denies any calf pain.  Past Medical History:  Diagnosis Date  . Arthritis   . CAD (coronary artery disease)   . 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 Depression   . Ejection fraction   . GERD (gastroesophageal reflux disease)   . Hyperlipidemia   . Hypertension   . PONV (postoperative nausea and vomiting)   . Skin cancer of face 05/2015   basal cell  . Stroke [facility]) 2006   deaf in right ear    Patient Active Problem List   Diagnosis Date Noted  . GI symptoms 04/10/2016  . OA (osteoarthritis) 02/26/2016  . Primary localized osteoarthritis of left knee 07/06/2015  . Hyperlipidemia   . Ejection fraction   . CAD (coronary artery disease)   . History of CVA (cerebrovascular accident) 10/28/2013  . Dyslipidemia  10/28/2013  . Dyspnea on exertion 10/28/2013  . Generalized anxiety disorder 04/11/2013  . GERD (gastroesophageal reflux disease) 02/02/2013  . Hypertension 02/02/2013    Past Surgical History:  Procedure Laterality Date  . BREAST BIOPSY Left 1984   abcess  . CESAREAN SECTION    . CHOLECYSTECTOMY  1990  . CHONDROPLASTY Left 01/05/2015   Procedure: CHONDROPLASTY;  Surgeon: Yvette Rack., MD;  Location: [place];  Service: Orthopedics;  Laterality: Left;  . KNEE ARTHROSCOPY WITH MEDIAL MENISECTOMY Left 01/05/2015   Procedure: LEFT KNEE ARTHROSCOPY WITH MEDIAL AND LATERAL MENISECTOMY; DEBRIDEMENT LATERAL PATELLA FEMORAL ;  Surgeon: Yvette Rack., MD;  Location: [place];  Service: Orthopedics;  Laterality: Left;  . LEFT HEART CATHETERIZATION WITH CORONARY ANGIOGRAM N/A 10/28/2013   Procedure: LEFT HEART CATHETERIZATION WITH CORONARY ANGIOGRAM;  Surgeon: Larey Dresser, MD;  Location: [facility] C[facility] LAB;  Service: Cardiovascular;  Laterality: N/A;  . TOTAL KNEE ARTHROPLASTY Left 07/06/2015   Procedure: TOTAL KNEE ARTHROPLASTY;  Surgeon: Earlie Server, MD;  Location: [place];  Service: Orthopedics;  Laterality: Left;  . TUBAL LIGATION      Prior to Admission medications   Medication Sig Start Date End Date Taking? Authorizing Provider  albuterol (PROAIR HFA) 108 (90 Base) MCG/ACT inhaler Inhale 1-2 puffs into the lungs every 6 (six) hours as needed for wheezing or shortness of breath. 01/12/17  Yes Lucille Passy, MD  benazepril-hydrochlorthiazide (LOTENSIN HCT) 20-25 MG tablet TAKE 1 TABLET BY MOUTH  EVERY DAY 12/05/16   Lucille Passy, MD  butalbital-acetaminophen-caffeine (FIORICET WITH CODEINE) [phone] MG capsule TAKE 1-2 CAPSULE BY MOUTH EVERY DAY 03/19/17   Lucille Passy, MD  chlorpheniramine-HYDROcodone [facility] PENNKINETIC ER) 10-8 MG/5ML SUER Take 5 mLs by mouth every 12 (twelve) hours as needed. Patient not taking: Reported on 03/27/2017 01/28/17   Lucille Passy, MD  cholecalciferol (VITAMIN D) 1000 UNITS tablet Take 1,000 Units by mouth daily.    Historical Provider, MD  diazepam (VALIUM) 5 MG tablet TAKE 1/2-1 TABLET BY MOUTH EVERY 12 HOURS AS NEEDED 03/19/17   Lucille Passy, MD  esomeprazole (NEXIUM) 40 MG capsule Take 1 capsule (40 mg total) by mouth 2 (two) times daily before a meal. 12/08/16   Ladene Artist, MD  ezetimibe (ZETIA) 10 MG tablet TAKE 1 TABLET BY MOUTH  DAILY 12/05/16   Lucille Passy, MD  sertraline (ZOLOFT) 25 MG tablet TAKE 1 TABLET BY MOUTH  DAILY 12/05/16   Lucille Passy, MD  valACYclovir (VALTREX) 1000 MG tablet TAKE 1 TABLET BY MOUTH TWICE DAILY Patient taking differently: as needed 10/20/16   Lucille Passy, MD    No Known Allergies  Family History  Problem Relation Age of Onset  . Healthy Mother   . Stroke Father   . Heart failure Father   . Ovarian cancer Paternal Grandmother   . Liver cancer Paternal Grandfather   . Colon cancer Neg Hx   . Stomach cancer Neg Hx   . Rectal cancer Neg Hx   . Esophageal cancer Neg Hx   . Breast cancer Neg Hx     Social History Social History  Substance Use Topics  . Smoking status: Never Smoker  . Smokeless tobacco: Never Used     Comment: LIVES WITH 2 SMOKERS   . Alcohol use Yes     Comment: Rare social ETOH    Review of Systems Constitutional: Negative for fever. Cardiovascular: Chest pain this morning at 6:30 associated with cough. Respiratory: Mild shortness breath at times. Positive for cough for the past 3 or 4 months intermittently. Gastrointestinal: Negative for abdominal pain. Denies vomiting. Neurological: Negative for headache 10-point ROS otherwise negative.  ____________________________________________   PHYSICAL EXAM:  VITAL SIGNS: ED Triage Vitals  Enc Vitals Group     BP 03/27/17 1329 (!) 159/101     Pulse Rate 03/27/17 1329 86     Resp 03/27/17 1329 20     Temp 03/27/17 1329 98.4 F (36.9 C)     Temp Source 03/27/17 1329 Oral     SpO2  03/27/17 1329 99 %     Weight 03/27/17 1329 204 lb (92.5 kg)     Height 03/27/17 1329 5\' 2"  (1.575 m)     Head Circumference --      Peak Flow --      Pain Score 03/27/17 1328 8     Pain Loc --      Pain Edu? --      Excl. in [place]? --     Constitutional: Alert and oriented. Well appearing and in no distress. Eyes: Normal exam ENT   Head: Normocephalic and atraumatic.   Mouth/Throat: Mucous membranes are moist. Cardiovascular: Normal rate, regular rhythm. No murmur Respiratory: Normal respiratory effort without tachypnea nor retractions. Breath sounds are clear Gastrointestinal: Soft and nontender. No distention.  Musculoskeletal: Nontender with normal range of motion in all extremities. Mild  pedal edema equal bilaterally. No calf tenderness. Neurologic:  Normal speech and language. No gross focal neurologic deficits  Skin:  Skin is warm, dry and intact.  Psychiatric: Mood and affect are normal.  ____________________________________________    EKG  EKG reviewed and interpreted by myself shows normal sinus rhythm at 88 bpm, narrow QRS, normal axis, normal intervals, nonspecific ST changes. No ST elevations.  ____________________________________________    RADIOLOGY  Chest x-ray shows pulmonary vascular congestion and bibasilar atelectasis. No infiltrate.  ____________________________________________   INITIAL IMPRESSION / ASSESSMENT AND PLAN / ED COURSE  Pertinent labs & imaging results that were available during my care of the patient were reviewed by me and considered in my medical decision making (see chart for details).  Patient presents the emergency department with a headache, chest pain associated with cough. Frequent cough over the past 3 or 4 months as well as increased lower extremity edema over the past several weeks. Patient overall has a normal physical exam mild pedal edema equal bilaterally currently. Chest sounds are clear and equal. Chest x-ray is most  consistent with pulmonary vascular congestion. This could possibly explain the patient's ongoing cough over the past 3 or 4 months as well as her increased lower extremity edema over the past several weeks. No calf tenderness, no unilateral swelling. Denies any shortness of breath or chest discomfort at this time. Patient's labs are largely within normal limits including a negative troponin. Given the patient's acute onset of headache this morning and states continued headache especially with cough we will obtain a CT scan of the head to further evaluate. If the CT scan is normal into this with the patient will be discharged home with Lasix and cardiology follow-up. Patient is agreeable to this plan. Currently she appears well in no distress.  Patient's CT head is negative. Patient states her headache is much improved after medications. We will discharge home with Lasix and cardiology follow-up. Patient is agreeable to this plan. I discussed return precautions.  ____________________________________________   FINAL CLINICAL IMPRESSION(S) / ED DIAGNOSES  Chest pain Congestive heart failure    Harvest Dark, MD 03/27/17 1744

## 2017-03-27 NOTE — ED Triage Notes (Signed)
States began bilateral chest pain at 0630 this am accompanied by SOB and headache. States has had bronchitis for past week but does not feel like the symptoms she has had associated with that.

## 2017-03-27 NOTE — Discharge Instructions (Signed)
As we discussed please begin taking her Lasix once daily in the morning. Please follow-up with cardiology by calling the number provided. Return to the emergency department for any chest pain, shortness of breath, or any other symptom personally concerning to yourself.

## 2017-03-30 ENCOUNTER — Other Ambulatory Visit: Payer: Self-pay | Admitting: *Deleted

## 2017-03-30 MED ORDER — HYDROCOD POLST-CPM POLST ER 10-8 MG/5ML PO SUER: 5.0000 mL | Freq: Two times a day (BID) | ORAL | 0 refills | Status: DC | PRN

## 2017-03-30 NOTE — Telephone Encounter (Signed)
Pt was recently seen at ED and was advised to contact PCP for refill. Last filled 12/2016

## 2017-03-31 ENCOUNTER — Ambulatory Visit (INDEPENDENT_AMBULATORY_CARE_PROVIDER_SITE_OTHER): Payer: 59 | Admitting: Internal Medicine

## 2017-03-31 ENCOUNTER — Telehealth: Payer: Self-pay | Admitting: Family Medicine

## 2017-03-31 ENCOUNTER — Encounter: Payer: Self-pay | Admitting: Internal Medicine

## 2017-03-31 VITALS — BP 110/70 | HR 86 | Ht 62.0 in | Wt 214.5 lb

## 2017-03-31 DIAGNOSIS — I25118 Atherosclerotic heart disease of native coronary artery with other forms of angina pectoris: Secondary | ICD-10-CM

## 2017-03-31 DIAGNOSIS — I10 Essential (primary) hypertension: Secondary | ICD-10-CM

## 2017-03-31 DIAGNOSIS — R0602 Shortness of breath: Secondary | ICD-10-CM

## 2017-03-31 DIAGNOSIS — M7989 Other specified soft tissue disorders: Secondary | ICD-10-CM | POA: Diagnosis not present

## 2017-03-31 DIAGNOSIS — E7800 Pure hypercholesterolemia, unspecified: Secondary | ICD-10-CM

## 2017-03-31 MED ORDER — NITROGLYCERIN 0.4 MG SL SUBL: 0.4000 mg | SUBLINGUAL_TABLET | SUBLINGUAL | 3 refills | Status: DC | PRN

## 2017-03-31 MED ORDER — ASPIRIN EC 81 MG PO TBEC: 81.0000 mg | DELAYED_RELEASE_TABLET | Freq: Every day | ORAL | 3 refills | Status: DC

## 2017-03-31 MED ORDER — ATORVASTATIN CALCIUM 20 MG PO TABS: 20.0000 mg | ORAL_TABLET | Freq: Every day | ORAL | 3 refills | Status: DC

## 2017-03-31 NOTE — Telephone Encounter (Signed)
Yes please schedule 30 min OV to discuss.  Thank you.

## 2017-03-31 NOTE — Telephone Encounter (Signed)
Left message asking pt to call office  °

## 2017-03-31 NOTE — Patient Instructions (Signed)
Medication Instructions:  Your physician has recommended you make the following change in your medication:  1- START Aspirin 81 mg by mouth once a day. 2- START Atorvastatin 20 mg (1 tablet) by mouth once a day. 3- Nitroglycerin 0.4 mg tablet under the tongue every 5 minutes AS NEEDED FOR CHEST PAIN. Do not take more that 3 doses.   If a single episode of chest pain is not relieved by one tablet, the patient will try another within 5 minutes; and if this doesn't relieve the pain, the patient is instructed to call 911 for transportation to an emergency department.   Labwork: none  Testing/Procedures: Your physician has requested that you have an echocardiogram. Echocardiography is a painless test that uses sound waves to create images of your heart. It provides your doctor with information about the size and shape of your heart and how well your heart's chambers and valves are working. This procedure takes approximately one hour. There are no restrictions for this procedure.    Follow-Up: Your physician recommends that you schedule a follow-up appointment in: [place].   If you need a refill on your cardiac medications before your next appointment, please call your pharmacy.   Echocardiogram An echocardiogram, or echocardiography, uses sound waves (ultrasound) to produce an image of your heart. The echocardiogram is simple, painless, obtained within a short period of time, and offers valuable information to your health care provider. The images from an echocardiogram can provide information such as:  Evidence of coronary artery disease (CAD).  Heart size.  Heart muscle function.  Heart valve function.  Aneurysm detection.  Evidence of a past heart attack.  Fluid buildup around the heart.  Heart muscle thickening.  Assess heart valve function. Tell a health care provider about:  Any allergies you have.  All medicines you are taking, including vitamins, herbs,  eye drops, creams, and over-the-counter medicines.  Any problems you or family members have had with anesthetic medicines.  Any blood disorders you have.  Any surgeries you have had.  Any medical conditions you have.  Whether you are pregnant or may be pregnant. What happens before the procedure? No special preparation is needed. Eat and drink normally. What happens during the procedure?  In order to produce an image of your heart, gel will be applied to your chest and a wand-like tool (transducer) will be moved over your chest. The gel will help transmit the sound waves from the transducer. The sound waves will harmlessly bounce off your heart to allow the heart images to be captured in real-time motion. These images will then be recorded.  You may need an IV to receive a medicine that improves the quality of the pictures. What happens after the procedure? You may return to your normal schedule including diet, activities, and medicines, unless your health care provider tells you otherwise. This information is not intended to replace advice given to you by your health care provider. Make sure you discuss any questions you have with your health care provider. Document Released: 12/12/2000 Document Revised: 08/02/2016 Document Reviewed: 08/22/2013 Elsevier Interactive Patient Education  2017 [employer].

## 2017-03-31 NOTE — Progress Notes (Signed)
New Outpatient Visit Date: 03/31/2017  Referring Provider: Harvest Dark, MD [facility]  Chief Complaint: Chest pain  HPI:  Diane [name] is seen today for the evaluation of chest pain at the request of Dr. Kerman Passey. The patient is a 66 y.o. year-old female with history of non-obstructive coronary artery disease, stroke, GERD, hyperlipidemia, and hypertension. 4 days ago, the patient began having chest heaviness/pressure while in the shower that radiated up to her shoulders, neck, and head. The pain was severe and lasted for about 10 minutes before resolving on its own. She notes 2 subsequent episodes later that day while at work, prompting evaluation in the ED. She had a maximal pain intensity of 10/10 with associated nausea and mild shortness of breath. Evaluation in the ED notable for chest radiograph demonstrating enlargement of the cardiac silhouette with pulmonary vascular congestion. The patient was given a prescription for furosemide. She noticed gradual improvement in her episodic chest pain, with the most recent episode occurring 2 days ago. She believes her chest pain was associated with sitting up from a recumbent position and began as a burning sensation at the base of her lungs. She notes that she had recently been dealing with bronchitis with increased coughing.  In the ED, the patient noted that her blood pressure was severely elevated. However, this has improved since returning home. Her previous cardiac workup included a cardiac catheterization and echocardiogram (see below). She not had any further chest pain since that time up until the past week. She has had poor energy over the last few weeks. She denies recent medication changes or new activities. She has gained about 10-15 pounds over the last year, which she attributes to less activity. She has not had any palpitations. She notes intermittent leg edema over the last month as well as stable 3 pillow orthopnea.  She denies PND. She has had some lightheadedness without falls or loss of consciousness accompanying her chest pain or wheezing from her bronchitis.  --------------------------------------------------------------------------------------------------  Cardiovascular History & Procedures: Cardiovascular Problems:  Nonobstructive coronary artery disease by catheterization in 2013  Atypical chest pain  Heart failure  Risk Factors:  Known coronary artery disease, hypertension, hyperlipidemia, obesity, and sedentary lifestyle  Cath/PCI:  LHC (10/28/13): LMCA normal. LAD with 30% proximal and 40% mid lesions. Large LCx without significant disease. Dominant RCA with 30% proximal stenosis. LVEF 55-60%.  CV Surgery:  None  EP Procedures and Devices:  None  Non-Invasive Evaluation(s):  Carotid Doppler (02/03/13): No significant extracranial carotid artery stenosis. Patent vertebral arteries with antegrade flow.  TTE (02/02/13): Normal LV size and wall thickness with LVEF of 55-60%. Normal LV wall motion. Grade 1 diastolic dysfunction. Normal RV size and function. No significant valvular abnormalities. Mild pulmonary hypertension.  Recent CV Pertinent Labs: Lab Results  Component Value Date   CHOL 290 (H) 11/24/2016   HDL 73 11/24/2016   LDLCALC 188 (H) 11/24/2016   TRIG 143 11/24/2016   CHOLHDL 4.0 11/24/2016   CHOLHDL 4.0 10/28/2013   INR 0.96 06/25/2015   K 3.6 03/27/2017   MG 2.0 10/28/2013   BUN 12 03/27/2017   BUN 13 03/23/2017   CREATININE 0.51 03/27/2017    --------------------------------------------------------------------------------------------------  Past Medical History:  Diagnosis Date  . Arthritis   . CAD (coronary artery disease)   . 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 Depression   . Ejection  fraction   . GERD (gastroesophageal reflux disease)   . Hyperlipidemia   .  Hypertension   . PONV (postoperative nausea and vomiting)   . Skin cancer of face 05/2015   basal cell  . Stroke [facility]) 2006   deaf in right ear    Past Surgical History:  Procedure Laterality Date  . BREAST BIOPSY Left 1984   abcess  . CESAREAN SECTION    . CHOLECYSTECTOMY  1990  . CHONDROPLASTY Left 01/05/2015   Procedure: CHONDROPLASTY;  Surgeon: Yvette Rack., MD;  Location: [place];  Service: Orthopedics;  Laterality: Left;  . KNEE ARTHROSCOPY WITH MEDIAL MENISECTOMY Left 01/05/2015   Procedure: LEFT KNEE ARTHROSCOPY WITH MEDIAL AND LATERAL MENISECTOMY; DEBRIDEMENT LATERAL PATELLA FEMORAL ;  Surgeon: Yvette Rack., MD;  Location: [place];  Service: Orthopedics;  Laterality: Left;  . LEFT HEART CATHETERIZATION WITH CORONARY ANGIOGRAM N/A 10/28/2013   Procedure: LEFT HEART CATHETERIZATION WITH CORONARY ANGIOGRAM;  Surgeon: Larey Dresser, MD;  Location[facility];  Service: Cardiovascular;  Laterality: N/A;  . TOTAL KNEE ARTHROPLASTY Left 07/06/2015   Procedure: TOTAL KNEE ARTHROPLASTY;  Surgeon: Earlie Server, MD;  Location: [place];  Service: Orthopedics;  Laterality: Left;  . TUBAL LIGATION      Outpatient Encounter Prescriptions as of 03/31/2017  Medication Sig  . albuterol (PROAIR HFA) 108 (90 Base) MCG/ACT inhaler Inhale 1-2 puffs into the lungs every 6 (six) hours as needed for wheezing or shortness of breath.  . benazepril-hydrochlorthiazide (LOTENSIN HCT) 20-25 MG tablet TAKE 1 TABLET BY MOUTH  EVERY DAY  . butalbital-acetaminophen-caffeine (FIORICET WITH CODEINE) 50-325-40-30 MG capsule TAKE 1-2 CAPSULE BY MOUTH EVERY DAY  . chlorpheniramine-HYDROcodone (TUSSIONEX PENNKINETIC ER) 10-8 MG/5ML SUER Take 5 mLs by mouth every 12 (twelve) hours as needed.  . cholecalciferol (VITAMIN D) 1000 UNITS tablet Take 1,000 Units by mouth daily.  . diazepam (VALIUM) 5 MG tablet TAKE 1/2-1 TABLET BY MOUTH EVERY 12 HOURS AS NEEDED  . esomeprazole (NEXIUM)  40 MG capsule Take 1 capsule (40 mg total) by mouth 2 (two) times daily before a meal.  . ezetimibe (ZETIA) 10 MG tablet TAKE 1 TABLET BY MOUTH  DAILY  . furosemide (LASIX) 20 MG tablet Take 1 tablet (20 mg total) by mouth daily.  [name] guaiFENesin-codeine 100-10 MG/5ML syrup Take 5 mLs by mouth every 6 (six) hours as needed for cough.  . sertraline (ZOLOFT) 25 MG tablet TAKE 1 TABLET BY MOUTH  DAILY  . valACYclovir (VALTREX) 1000 MG tablet TAKE 1 TABLET BY MOUTH TWICE DAILY (Patient taking differently: as needed)   Facility-Administered Encounter Medications as of 03/31/2017  Medication  . 0.9 %  sodium chloride infusion    Allergies: Patient has no known allergies.  Social History   Social History  . Marital status: Divorced    Spouse name: [name]  . Number of children: 8  . Years of education: N/A   Occupational History  . Works full time at [employer] as [place]  . Smoking status: Never Smoker  . Smokeless tobacco: Never Used     Comment: LIVES WITH 2 SMOKERS   . Alcohol use Yes     Comment: Rare social ETOH  . Drug use: No  . Sexual activity: Not on file   Other Topics Concern  . Not on file   Social History Narrative   Divorced, but has fiancee.  Lives with fiancee  in [place].  Moved here recently from [place], [place].     She 8 children- ages 49- 77.                Family History  Problem Relation Age of Onset  . Healthy Mother   . Hypertension Mother   . Stroke Father   . Heart failure Father   . Ovarian cancer Paternal Grandmother   . Liver cancer Paternal Grandfather   . Stroke Maternal Grandmother   . Heart failure Maternal Grandmother   . Heart failure Maternal Grandfather   . Colon cancer Neg Hx   . Stomach cancer Neg Hx   . Rectal cancer Neg Hx   . Esophageal cancer Neg Hx   . Breast cancer Neg Hx     Review of Systems: A 12-system review of systems was performed and was negative except as noted in  the HPI.  --------------------------------------------------------------------------------------------------  Physical Exam: BP 110/68 (BP Location: Left Arm, Patient Position: Sitting, Cuff Size: Normal)   Pulse 86   Ht 5\' 2"  (1.575 m)   Wt 214 lb 8 oz (97.3 kg)   BMI 39.23 kg/m   General:  Obese woman, seated comfortably in the exam room. HEENT: No conjunctival pallor or scleral icterus.  Moist mucous membranes.  OP clear. Neck: Supple without lymphadenopathy, thyromegaly, JVD, or HJR.  No carotid bruit. Lungs: Normal work of breathing.  Clear to auscultation bilaterally without wheezes or crackles. Heart: Regular rate and rhythm without murmurs, rubs, or gallops.  Unable to assess PMI due to body habitus. Abd: Bowel sounds present.  Soft, NT/ND. Unable to assess hepatosplenomegaly due to body habitus. Ext: Trace ankle edema bilaterally.  Radial, PT, and DP pulses are 2+ bilaterally Skin: warm and dry without rash Neuro: CNIII-XII intact.  Strength and fine-touch sensation intact in upper and lower extremities bilaterally. Psych: Normal mood and affect.  EKG:  Normal sinus rhythm with borderline LVH. No significant change from prior tracing on 03/27/17 (I have personally reviewed both tracings).  Lab Results  Component Value Date   WBC 7.6 03/27/2017   HGB 14.9 03/27/2017   HCT 43.1 03/27/2017   MCV 96.6 03/27/2017   PLT 198 03/27/2017    Lab Results  Component Value Date   NA 137 03/27/2017   K 3.6 03/27/2017   CL 100 (L) 03/27/2017   CO2 28 03/27/2017   BUN 12 03/27/2017   CREATININE 0.51 03/27/2017   GLUCOSE 103 (H) 03/27/2017   ALT 30 03/27/2017    Lab Results  Component Value Date   CHOL 290 (H) 11/24/2016   HDL 73 11/24/2016   LDLCALC 188 (H) 11/24/2016   TRIG 143 11/24/2016   CHOLHDL 4.0 11/24/2016    --------------------------------------------------------------------------------------------------  ASSESSMENT AND PLAN: Coronary artery disease with  atypical chest pain, shortness of breath, and leg swelling Patient's chest pain is atypical given that it has started at rest and involves pain radiating all the way to the top of her head. Risk factors include known nonobstructive CAD involving the LAD and RCA in 2013, hypertension, hyperlipidemia, and obesity. Given that some of her symptoms also suggest underlying heart failure (orthopnea, leg edema, and symptomatic improvement with diuresis), we have agreed to begin with a transthoracic echocardiogram. If her EF is reduced, we will proceed with cardiac catheterization. Otherwise, we will perform a myocardial perfusion stress test. I have advised the patient to begin taking aspirin 81 mg daily and atorvastatin 20 mg daily, given that her most recent LDL  in 10/2016 was quite elevated at 188. I have also provided her with a prescription for sublingual nitroglycerin to be taken as needed for chest pain. If her chest pain worsens, she should seek immediate medical attention.  Essential hypertension Blood pressure is well controlled today. We will continue her current medications.  Hyperlipidemia LDL elevated at 188 in 10/2016. Given nonobstructive CAD by prior catheterization, I have recommended addition of statin therapy. We will begin atorvastatin 20 mg daily with plan to repeat a lipid panel and ALT in about 6 weeks. The patient can continue with ezetimibe for the time being.  Follow-up: Return to clinic in 6 weeks.  Nelva Bush, MD 03/31/2017 8:12 PM

## 2017-03-31 NOTE — Telephone Encounter (Signed)
Pt faxed paperwork from [employer] wanting FMLA for Asthma(shortness of breath)  On pt problem list I dont see Asthma.  There is a couple of visit for wheezing cough.  Does Pt need follow up appointment to discuss this with you before filling out.  IN Dr Hulen Shouts IN BOX

## 2017-04-01 NOTE — Telephone Encounter (Signed)
Left message asking pt to call office  °

## 2017-04-01 NOTE — Telephone Encounter (Signed)
Appointment 4/5 pt aware °

## 2017-04-02 ENCOUNTER — Ambulatory Visit (INDEPENDENT_AMBULATORY_CARE_PROVIDER_SITE_OTHER): Payer: 59 | Admitting: Family Medicine

## 2017-04-02 VITALS — BP 126/82 | HR 90 | Wt 216.8 lb

## 2017-04-02 DIAGNOSIS — R0602 Shortness of breath: Secondary | ICD-10-CM | POA: Insufficient documentation

## 2017-04-02 DIAGNOSIS — R079 Chest pain, unspecified: Secondary | ICD-10-CM

## 2017-04-02 DIAGNOSIS — I25118 Atherosclerotic heart disease of native coronary artery with other forms of angina pectoris: Secondary | ICD-10-CM | POA: Diagnosis not present

## 2017-04-02 NOTE — Progress Notes (Signed)
Subjective:   Patient ID: Diane [name], female    DOB: [birth year], 66 y.o.   MRN: [id number]  Diane [name] is a pleasant 66 y.o. year old female who presents to clinic today with Hospitalization Follow-up (ER -[facility] 03/27/17) and FMLA (paperwork)  on 04/02/2017  HPI:  Was seen in the ED on 03/27/2017 for CP, HA and SOB.  Notes reviewed.  Has been having episodes of SOB when when she first wakes up.  Radiates to her arms and to her head.  CXR did show signs of pulmonary vascular congestion. Head CT ( as pain seems to radiate to her head) and labs were essentially normal. Troponins neg.  Given diuretics and symptoms improved some.  She is still quite short of breath with exertion and having headaches.  Dg Chest 2 View  Result Date: 03/27/2017 CLINICAL DATA:  BILATERAL chest pain beginning at 0630 hours today accompanied by shortness of breath and headache, bronchitis off and on for 2 months, history stroke, hypertension, GERD, coronary artery disease EXAM: CHEST  2 VIEW COMPARISON:  03/23/2017 FINDINGS: Enlargement of cardiac silhouette with pulmonary vascular congestion. Atherosclerotic calcification aorta. Minimal bibasilar atelectasis. Lungs otherwise clear. No pleural effusion or pneumothorax. Bones demineralized. IMPRESSION: Enlargement of cardiac silhouette with pulmonary vascular congestion. Bibasilar atelectasis without definite infiltrate. Electronically Signed   By: Lavonia Dana M.D.   On: 03/27/2017 14:03   Dg Chest 2 View  Result Date: 03/23/2017 CLINICAL DATA:  Shortness of breath and cough EXAM: CHEST  2 VIEW COMPARISON:  June 25, 2015 FINDINGS: There is no edema or consolidation. Heart size and pulmonary vascularity are normal. No adenopathy. There is atherosclerotic calcification in the aorta. There is degenerative change in the thoracic spine. IMPRESSION: Aortic atherosclerosis.  No edema or consolidation. Electronically Signed   By: Lowella Grip III M.D.   On:  03/23/2017 13:33   Ct Head Wo Contrast  Result Date: 03/27/2017 CLINICAL DATA:  Pt states she has hx of stroke in 98 and hx of TIA since then. Also states hx of migraines. Complains of severe headaches today, states "worse than any migraine" No known injuries. EXAM: CT HEAD WITHOUT CONTRAST TECHNIQUE: Contiguous axial images were obtained from the base of the skull through the vertex without intravenous contrast. COMPARISON:  Brain MRI and head CT, 02/02/2013. FINDINGS: Brain: No evidence of acute infarction, hemorrhage, hydrocephalus, extra-axial collection or mass lesion/mass effect. There is ventricular and sulcal enlargement reflecting mild generalized atrophy. A focal area of hypoattenuation is noted in the left corona radiata consistent with an old infarct, similar to the prior head CT. Vascular: No hyperdense vessel or unexpected calcification. Skull: Normal. Negative for fracture or focal lesion. Sinuses/Orbits: Globes and orbits are unremarkable. Visualized sinuses and mastoid air cells are clear. Other: None. IMPRESSION: 1. No acute intracranial abnormalities. Stable appearance from the prior head CT. Electronically Signed   By: Diane [name] M.D.   On: 03/27/2017 16:44   Saw cardiology, Dr. Saunders Revel on 03/31/17 who stated the following:   Coronary artery disease with atypical chest pain, shortness of breath, and leg swelling Patient's chest pain is atypical given that it has started at rest and involves pain radiating all the way to the top of her head. Risk factors include known nonobstructive CAD involving the LAD and RCA in 2013, hypertension, hyperlipidemia, and obesity. Given that some of her symptoms also suggest underlying heart failure (orthopnea, leg edema, and symptomatic improvement with diuresis), we have agreed to begin with a  transthoracic echocardiogram. If her EF is reduced, we will proceed with cardiac catheterization. Otherwise, we will perform a myocardial perfusion stress test. I  have advised the patient to begin taking aspirin 81 mg daily and atorvastatin 20 mg daily, given that her most recent LDL in 10/2016 was quite elevated at 188. I have also provided her with a prescription for sublingual nitroglycerin to be taken as needed for chest pain. If her chest pain worsens, she should seek immediate medical attention.   Current Outpatient Prescriptions on File Prior to Visit  Medication Sig Dispense Refill  . albuterol (PROAIR HFA) 108 (90 Base) MCG/ACT inhaler Inhale 1-2 puffs into the lungs every 6 (six) hours as needed for wheezing or shortness of breath. 1 Inhaler 1  . aspirin EC 81 MG tablet Take 1 tablet (81 mg total) by mouth daily. 90 tablet 3  . atorvastatin (LIPITOR) 20 MG tablet Take 1 tablet (20 mg total) by mouth daily. 90 tablet 3  . benazepril-hydrochlorthiazide (LOTENSIN HCT) 20-25 MG tablet TAKE 1 TABLET BY MOUTH  EVERY DAY 90 tablet 1  . butalbital-acetaminophen-caffeine (FIORICET WITH CODEINE) 50-325-40-30 MG capsule TAKE 1-2 CAPSULE BY MOUTH EVERY DAY 30 capsule 0  . chlorpheniramine-HYDROcodone (TUSSIONEX PENNKINETIC ER) 10-8 MG/5ML SUER Take 5 mLs by mouth every 12 (twelve) hours as needed. 140 mL 0  . cholecalciferol (VITAMIN D) 1000 UNITS tablet Take 1,000 Units by mouth daily.    . diazepam (VALIUM) 5 MG tablet TAKE 1/2-1 TABLET BY MOUTH EVERY 12 HOURS AS NEEDED 60 tablet 0  . esomeprazole (NEXIUM) 40 MG capsule Take 1 capsule (40 mg total) by mouth 2 (two) times daily before a meal. 60 capsule 5  . ezetimibe (ZETIA) 10 MG tablet TAKE 1 TABLET BY MOUTH  DAILY 90 tablet 1  . furosemide (LASIX) 20 MG tablet Take 1 tablet (20 mg total) by mouth daily. 30 tablet 0  . guaiFENesin-codeine 100-10 MG/5ML syrup Take 5 mLs by mouth every 6 (six) hours as needed for cough. 120 mL 0  . nitroGLYCERIN (NITROSTAT) 0.4 MG SL tablet Place 1 tablet (0.4 mg total) under the tongue every 5 (five) minutes as needed for chest pain. For maximum of 3 times. 35 tablet 3  .  sertraline (ZOLOFT) 25 MG tablet TAKE 1 TABLET BY MOUTH  DAILY 90 tablet 1  . valACYclovir (VALTREX) 1000 MG tablet TAKE 1 TABLET BY MOUTH TWICE DAILY (Patient taking differently: as needed) 10 tablet 5   Current Facility-Administered Medications on File Prior to Visit  Medication Dose Route Frequency Provider Last Rate Last Dose  . 0.9 %  sodium chloride infusion  500 mL Intravenous Continuous Ladene Artist, MD        No Known Allergies  Past Medical History:  Diagnosis Date  . Arthritis   . CAD (coronary artery disease)   . 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 Depression   . Ejection fraction   . GERD (gastroesophageal reflux disease)   . Hyperlipidemia   . Hypertension   . PONV (postoperative nausea and vomiting)   . Skin cancer of face 05/2015   basal cell  . Stroke [facility]) 2006   deaf in right ear    Past Surgical History:  Procedure Laterality Date  . BREAST BIOPSY Left 1984   abcess  . CESAREAN SECTION    . CHOLECYSTECTOMY  1990  . CHONDROPLASTY  Left 01/05/2015   Procedure: CHONDROPLASTY;  Surgeon: Yvette Rack., MD;  Location: [place];  Service: Orthopedics;  Laterality: Left;  . KNEE ARTHROSCOPY WITH MEDIAL MENISECTOMY Left 01/05/2015   Procedure: LEFT KNEE ARTHROSCOPY WITH MEDIAL AND LATERAL MENISECTOMY; DEBRIDEMENT LATERAL PATELLA FEMORAL ;  Surgeon: Yvette Rack., MD;  Location: [place];  Service: Orthopedics;  Laterality: Left;  . LEFT HEART CATHETERIZATION WITH CORONARY ANGIOGRAM N/A 10/28/2013   Procedure: LEFT HEART CATHETERIZATION WITH CORONARY ANGIOGRAM;  Surgeon: Larey Dresser, MD;  Location: [facility] CATH LAB;  Service: Cardiovascular;  Laterality: N/A;  . TOTAL KNEE ARTHROPLASTY Left 07/06/2015   Procedure: TOTAL KNEE ARTHROPLASTY;  Surgeon: Earlie Server, MD;  Location: [place];  Service: Orthopedics;  Laterality: Left;  . TUBAL LIGATION       Family History  Problem Relation Age of Onset  . Healthy Mother   . Hypertension Mother   . Stroke Mother   . Stroke Father   . Heart failure Father   . Heart disease Father     CABG  . Ovarian cancer Paternal Grandmother   . Liver cancer Paternal Grandfather   . Stroke Maternal Grandmother   . Heart failure Maternal Grandmother   . Heart failure Maternal Grandfather   . Colon cancer Neg Hx   . Stomach cancer Neg Hx   . Rectal cancer Neg Hx   . Esophageal cancer Neg Hx   . Breast cancer Neg Hx     Social History   Social History  . Marital status: Divorced    Spouse name: [name]  . Number of children: 8  . Years of education: N/A   Occupational History  . Works full time at [employer] as [place]  . Smoking status: Never Smoker  . Smokeless tobacco: Never Used     Comment: LIVES WITH 2 SMOKERS   . Alcohol use Yes     Comment: 1 glass of wine per month  . Drug use: No  . Sexual activity: Not on file   Other Topics Concern  . Not on file   Social History Narrative   Divorced, but has fiancee.  Lives with fiancee in [place].  Moved here recently from [place], [place].     She 8 children- ages 38- 47.               The PMH, PSH, Social History, Family History, Medications, and allergies have been reviewed in [facility], and have been updated if relevant.  Review of Systems  Constitutional: Negative.   Respiratory: Positive for cough, shortness of breath and wheezing. Negative for apnea, choking, chest tightness and stridor.   Cardiovascular: Positive for chest pain.  Endocrine: Negative.   Genitourinary: Negative.   Musculoskeletal: Negative.   Allergic/Immunologic: Negative.   Neurological: Positive for headaches. Negative for tremors, seizures, syncope, speech difficulty, weakness, light-headedness and numbness.  Hematological: Negative.   Psychiatric/Behavioral: Negative.   All other systems reviewed and are  negative.      Objective:    BP 126/82   Pulse 90   Wt 216 lb 12 oz (98.3 kg)   SpO2 97%   BMI 39.64 kg/m   Wt Readings from Last 3 Encounters:  04/02/17 216 lb 12 oz (98.3 kg)  03/31/17 214 lb 8 oz (97.3 kg)  03/27/17 204 lb (92.5 kg)      Physical Exam  Constitutional: She appears well-developed and well-nourished. No  distress.  HENT:  Head: Normocephalic and atraumatic.  Eyes: Conjunctivae are normal.  Cardiovascular: Regular rhythm.   Pulmonary/Chest: She has decreased breath sounds in the right lower field and the left lower field. She has no wheezes. She has no rhonchi. She has no rales.  Musculoskeletal: She exhibits edema.  Skin: She is not diaphoretic.  Nursing note and vitals reviewed.         Assessment & Plan:   Coronary artery disease involving native coronary artery of native heart with other form of angina pectoris ([place])  Chest pain, unspecified type - Plan: D-dimer, quantitative (not at [facility])  SOB (shortness of breath) - Plan: Brain natriuretic peptide, Comprehensive metabolic panel, D-dimer, quantitative (not at [facility]) No Follow-up on file.

## 2017-04-02 NOTE — Patient Instructions (Signed)
Great to see you.  I will call you with your results.  

## 2017-04-02 NOTE — Assessment & Plan Note (Addendum)
Agree that Symptoms and presentation are consistent with volume overload/CHF. She has gained weight, LE edema, CXR findings consistent with this as well. Scheduled for echo and cardiac cath. Continue lasix.  Check BNP and renal function today.  Will also check a d dimer given atypical presentation. The patient indicates understanding of these issues and agrees with the plan.   Orders Placed This Encounter  Procedures  . Brain natriuretic peptide  . Comprehensive metabolic panel  . D-dimer, quantitative (not at [facility])

## 2017-04-03 ENCOUNTER — Other Ambulatory Visit: Payer: Self-pay | Admitting: Family Medicine

## 2017-04-03 ENCOUNTER — Telehealth: Payer: Self-pay

## 2017-04-03 ENCOUNTER — Ambulatory Visit (HOSPITAL_COMMUNITY)
Admission: RE | Admit: 2017-04-03 | Discharge: 2017-04-03 | Disposition: A | Discharge status: Home / Self Care | Payer: 59 | Source: Ambulatory Visit | Attending: Family Medicine | Admitting: Family Medicine

## 2017-04-03 DIAGNOSIS — R7989 Other specified abnormal findings of blood chemistry: Secondary | ICD-10-CM

## 2017-04-03 DIAGNOSIS — R938 Abnormal findings on diagnostic imaging of other specified body structures: Secondary | ICD-10-CM | POA: Diagnosis not present

## 2017-04-03 DIAGNOSIS — I2510 Atherosclerotic heart disease of native coronary artery without angina pectoris: Secondary | ICD-10-CM | POA: Diagnosis not present

## 2017-04-03 LAB — COMPREHENSIVE METABOLIC PANEL
ALBUMIN: 4.5 g/dL (ref 3.6–4.8)
ALT: 36 IU/L — ABNORMAL HIGH (ref 0–32)
AST: 25 IU/L (ref 0–40)
Albumin/Globulin Ratio: 1.7 (ref 1.2–2.2)
Alkaline Phosphatase: 75 IU/L (ref 39–117)
BUN / CREAT RATIO: 25 (ref 12–28)
BUN: 18 mg/dL (ref 8–27)
Bilirubin Total: 0.3 mg/dL (ref 0.0–1.2)
CO2: 31 mmol/L — AB (ref 18–29)
CREATININE: 0.72 mg/dL (ref 0.57–1.00)
Calcium: 9.7 mg/dL (ref 8.7–10.3)
Chloride: 88 mmol/L — ABNORMAL LOW (ref 96–106)
GFR calc non Af Amer: 89 mL/min/{1.73_m2} (ref >59)
GFR, EST AFRICAN AMERICAN: 102 mL/min/{1.73_m2} (ref >59)
GLUCOSE: 104 mg/dL — AB (ref 65–99)
Globulin, Total: 2.6 g/dL (ref 1.5–4.5)
Potassium: 4.1 mmol/L (ref 3.5–5.2)
Sodium: 136 mmol/L (ref 134–144)
TOTAL PROTEIN: 7.1 g/dL (ref 6.0–8.5)

## 2017-04-03 LAB — BRAIN NATRIURETIC PEPTIDE: BNP: 14.4 pg/mL (ref 0.0–100.0)

## 2017-04-03 LAB — D-DIMER, QUANTITATIVE: D-DIMER: 0.67 mg/L FEU — ABNORMAL HIGH (ref 0.00–0.49)

## 2017-04-03 MED ORDER — IOPAMIDOL (ISOVUE-370) INJECTION 76%
100.0000 mL | Freq: Once | INTRAVENOUS | Status: AC | PRN
  Administered 2017-04-03: 100 mL via INTRAVENOUS

## 2017-04-03 MED ORDER — IOPAMIDOL (ISOVUE-370) INJECTION 76%
INTRAVENOUS | Status: AC
  Filled 2017-04-03: qty 100

## 2017-04-03 NOTE — Telephone Encounter (Signed)
CT called report for CT Angio Chest and pt is waiting; report is in Epic; [name] out of office and Dr Darnell Level reviewed; No sign of blood clot; CT looked OK. F/U with cardiology as planned. Will send to [name] for review. Pt notified and voiced understanding.

## 2017-04-06 ENCOUNTER — Other Ambulatory Visit: Payer: Self-pay | Admitting: Family Medicine

## 2017-04-06 MED ORDER — HYDROCOD POLST-CPM POLST ER 10-8 MG/5ML PO SUER: 5.0000 mL | Freq: Two times a day (BID) | ORAL | 0 refills | Status: DC | PRN

## 2017-04-10 ENCOUNTER — Telehealth: Payer: Self-pay

## 2017-04-10 NOTE — Addendum Note (Signed)
Addended by: Kittie Plater on: 04/10/2017 07:48 AM   Modules accepted: Orders

## 2017-04-10 NOTE — Telephone Encounter (Signed)
Diane [name] with [name] said pt has presented 04/06/17 rx for tussionex to pharmacist but pt is 4 days early and requesting to pick up today. Pt said [name] advised her to take first rx q4h as a verbal instruction but that was not what was on rx.Please advise. Pt is waiting.

## 2017-04-10 NOTE — Telephone Encounter (Signed)
Dr Damita Dunnings reviewed med list for tussionex rx on 03/30/17 and 04/06/17.Dr Damita Dunnings said will need to send note to [name] for approval.  Pharmacy mgr at [facility] notified and voiced understanding.[name]

## 2017-04-10 NOTE — Telephone Encounter (Signed)
This looks too early, I'll defer to PCP.  Thanks.

## 2017-04-13 MED ORDER — HYDROCOD POLST-CPM POLST ER 10-8 MG/5ML PO SUER: 5.0000 mL | Freq: Two times a day (BID) | ORAL | 0 refills | Status: DC | PRN

## 2017-04-13 NOTE — Telephone Encounter (Signed)
Noted.  Appropriate leadership has been made aware.

## 2017-04-13 NOTE — Telephone Encounter (Signed)
Ok to refill but she may already have it at this point.

## 2017-04-13 NOTE — Telephone Encounter (Signed)
Pt said she got it yesterday from the rx that was faxed last week!?  I called the pharmacy and first spoke to [place]. She confirmed that the pt got the medication from a rx that was faxed on 04-06-17. I questioned how a hydrocodone rx was filled by fax. She said she thought some could and some could not. I asked for a pharmacist.  Spoke to [facility], pharmacist. He realized this was a mistake. Michela Pitcher it must have happened during a busy period. I will mail him the rx that was just printed to replace the rx that was faxed to them last week.

## 2017-04-13 NOTE — Telephone Encounter (Signed)
I do not see that it was ever done last week.

## 2017-04-13 NOTE — Telephone Encounter (Signed)
Noted.  Please call pt and pharmacy.  Ok to refill one time only.

## 2017-04-14 ENCOUNTER — Ambulatory Visit: Payer: 59 | Admitting: Cardiology

## 2017-04-15 ENCOUNTER — Telehealth: Payer: Self-pay

## 2017-04-15 NOTE — Telephone Encounter (Signed)
Pt said she was feeling better and then 04/14/17 pt coughed a lot during the night; pt was coughing today and could not go to work today; pt needs note for work;  Pt feels like her lungs hurt on and off, prod cough x 1 today with large amt of yellow phlegm. Some wheeze today but inhaler helps the wheeze.pt has sinus pressure behind eyes and h/a today; pain level now 7. Pt scheduled appt with [name] 04/16/17 at 7:30 and if worsens prior to appt pt will go to ED. FYI to [name].

## 2017-04-16 ENCOUNTER — Encounter: Payer: Self-pay | Admitting: Family Medicine

## 2017-04-16 ENCOUNTER — Ambulatory Visit (INDEPENDENT_AMBULATORY_CARE_PROVIDER_SITE_OTHER): Payer: 59 | Admitting: Family Medicine

## 2017-04-16 VITALS — BP 104/60 | HR 98 | Temp 98.1°F | Resp 18 | Wt 212.0 lb

## 2017-04-16 DIAGNOSIS — E041 Nontoxic single thyroid nodule: Secondary | ICD-10-CM | POA: Insufficient documentation

## 2017-04-16 DIAGNOSIS — R0602 Shortness of breath: Secondary | ICD-10-CM | POA: Diagnosis not present

## 2017-04-16 DIAGNOSIS — J0111 Acute recurrent frontal sinusitis: Secondary | ICD-10-CM

## 2017-04-16 DIAGNOSIS — J019 Acute sinusitis, unspecified: Secondary | ICD-10-CM | POA: Insufficient documentation

## 2017-04-16 DIAGNOSIS — R059 Cough, unspecified: Secondary | ICD-10-CM | POA: Insufficient documentation

## 2017-04-16 DIAGNOSIS — R05 Cough: Secondary | ICD-10-CM | POA: Diagnosis not present

## 2017-04-16 MED ORDER — DOXYCYCLINE HYCLATE 100 MG PO TABS: 100.0000 mg | ORAL_TABLET | Freq: Two times a day (BID) | ORAL | 0 refills | Status: DC

## 2017-04-16 MED ORDER — PROMETHAZINE-DM 6.25-15 MG/5ML PO SYRP: 5.0000 mL | ORAL_SOLUTION | Freq: Four times a day (QID) | ORAL | 0 refills | Status: DC | PRN

## 2017-04-16 NOTE — Assessment & Plan Note (Signed)
Given duration and progression of symptoms, will treat for bacterial sinusitis with doxycyline which should also cover respiratory pathogens.

## 2017-04-16 NOTE — Progress Notes (Signed)
Pre visit review using our clinic review tool, if applicable. No additional management support is needed unless otherwise documented below in the visit note. 

## 2017-04-16 NOTE — Patient Instructions (Signed)
Great to see you.  Please take doxycyline as directed- 1 tab twice daily x 10 days.  We are referring you to a lung doctor and ordering a thyroid ultrasound.

## 2017-04-16 NOTE — Progress Notes (Signed)
Subjective:   Patient ID: Diane [name], female    DOB: [birth year], 66 y.o.   MRN: [id number]  Diane [name] is a pleasant 66 y.o. year old female who presents to clinic today with Cough (Still having a cough. Coughing up alot of phlegm. Some pain in her jaw up into her ears. )  on 04/16/2017  HPI:  Persistent cough-  Has had intermittent cough for months.  Was seen in ED for this and associated CP, HA and SOB on 03/27/17.  I saw her for ED follow up on 04/02/17.  Her symptoms had improved some with diuretics.  BNP was normal.  I did order a d dimer which was positive- CT angio neg for PE but did show a thyroid nodule.  Here today because cough is now productive of yellow phlegm. Having tooth pain as well with sinus pressure for past 1 -2 weeks.  Dg Chest 2 View  Result Date: 03/27/2017 CLINICAL DATA:  BILATERAL chest pain beginning at 0630 hours today accompanied by shortness of breath and headache, bronchitis off and on for 2 months, history stroke, hypertension, GERD, coronary artery disease EXAM: CHEST  2 VIEW COMPARISON:  03/23/2017 FINDINGS: Enlargement of cardiac silhouette with pulmonary vascular congestion. Atherosclerotic calcification aorta. Minimal bibasilar atelectasis. Lungs otherwise clear. No pleural effusion or pneumothorax. Bones demineralized. IMPRESSION: Enlargement of cardiac silhouette with pulmonary vascular congestion. Bibasilar atelectasis without definite infiltrate. Electronically Signed   By: Lavonia Dana M.D.   On: 03/27/2017 14:03   Dg Chest 2 View  Result Date: 03/23/2017 CLINICAL DATA:  Shortness of breath and cough EXAM: CHEST  2 VIEW COMPARISON:  June 25, 2015 FINDINGS: There is no edema or consolidation. Heart size and pulmonary vascularity are normal. No adenopathy. There is atherosclerotic calcification in the aorta. There is degenerative change in the thoracic spine. IMPRESSION: Aortic atherosclerosis.  No edema or consolidation. Electronically  Signed   By: Lowella Grip III M.D.   On: 03/23/2017 13:33   Ct Head Wo Contrast  Result Date: 03/27/2017 CLINICAL DATA:  Pt states she has hx of stroke in 98 and hx of TIA since then. Also states hx of migraines. Complains of severe headaches today, states "worse than any migraine" No known injuries. EXAM: CT HEAD WITHOUT CONTRAST TECHNIQUE: Contiguous axial images were obtained from the base of the skull through the vertex without intravenous contrast. COMPARISON:  Brain MRI and head CT, 02/02/2013. FINDINGS: Brain: No evidence of acute infarction, hemorrhage, hydrocephalus, extra-axial collection or mass lesion/mass effect. There is ventricular and sulcal enlargement reflecting mild generalized atrophy. A focal area of hypoattenuation is noted in the left corona radiata consistent with an old infarct, similar to the prior head CT. Vascular: No hyperdense vessel or unexpected calcification. Skull: Normal. Negative for fracture or focal lesion. Sinuses/Orbits: Globes and orbits are unremarkable. Visualized sinuses and mastoid air cells are clear. Other: None. IMPRESSION: 1. No acute intracranial abnormalities. Stable appearance from the prior head CT. Electronically Signed   By: Lajean Manes M.D.   On: 03/27/2017 16:44   Ct Angio Chest W/cm &/or Wo Cm  Result Date: 04/03/2017 CLINICAL DATA:  Chest pain with shortness of breath and positive D-dimer EXAM: CT ANGIOGRAPHY CHEST WITH CONTRAST TECHNIQUE: Multidetector CT imaging of the chest was performed using the standard protocol during bolus administration of intravenous contrast. Multiplanar CT image reconstructions and MIPs were obtained to evaluate the vascular anatomy. CONTRAST:  100 mL Isovue 370 intravenous COMPARISON:  Chest x-ray 03/27/2017 FINDINGS:  Cardiovascular: The examination is limited by respiratory motion artifact. Satisfactory opacification of the pulmonary arteries to the segmental level. No evidence of pulmonary embolism. Non  aneurysmal aorta. No dissection. Minimal atherosclerotic calcification. Mild coronary artery calcification. The heart is borderline enlarged. No large pericardial effusion. Mediastinum/Nodes: Probable 1.2 cm hypodense nodule in the right lobe of the thyroid. Midline trachea. No significant mediastinal or hilar adenopathy. Esophagus within normal limits. Lungs/Pleura: Lungs are clear. No pleural effusion or pneumothorax. Upper Abdomen: No acute abnormality. Musculoskeletal: No chest wall abnormality. No acute or significant osseous findings. Degenerative changes of the mid thoracic spine. Review of the MIP images confirms the above findings. IMPRESSION: 1. Study is slightly degraded by respiratory motion artifact. No definite evidence for acute pulmonary embolus or aortic dissection 2. Minimal atherosclerotic vascular calcification and mild coronary artery calcification 3. Probable 1.2 cm hypodense nodule in the right lobe of thyroid. Nonemergent thyroid ultrasound may be obtained as clinically indicated. Electronically Signed   By: Donavan Foil M.D.   On: 04/03/2017 14:12     Current Outpatient Prescriptions on File Prior to Visit  Medication Sig Dispense Refill  . aspirin EC 81 MG tablet Take 1 tablet (81 mg total) by mouth daily. 90 tablet 3  . atorvastatin (LIPITOR) 20 MG tablet Take 1 tablet (20 mg total) by mouth daily. 90 tablet 3  . benazepril-hydrochlorthiazide (LOTENSIN HCT) 20-25 MG tablet TAKE 1 TABLET BY MOUTH  EVERY DAY 90 tablet 1  . butalbital-acetaminophen-caffeine (FIORICET WITH CODEINE) 50-325-40-30 MG capsule TAKE 1-2 CAPSULE BY MOUTH EVERY DAY 30 capsule 0  . chlorpheniramine-HYDROcodone (TUSSIONEX PENNKINETIC ER) 10-8 MG/5ML SUER Take 5 mLs by mouth every 12 (twelve) hours as needed. 140 mL 0  . cholecalciferol (VITAMIN D) 1000 UNITS tablet Take 1,000 Units by mouth daily.    . diazepam (VALIUM) 5 MG tablet TAKE 1/2-1 TABLET BY MOUTH EVERY 12 HOURS AS NEEDED 60 tablet 0  .  esomeprazole (NEXIUM) 40 MG capsule Take 1 capsule (40 mg total) by mouth 2 (two) times daily before a meal. 60 capsule 5  . ezetimibe (ZETIA) 10 MG tablet TAKE 1 TABLET BY MOUTH  DAILY 90 tablet 1  . furosemide (LASIX) 20 MG tablet Take 1 tablet (20 mg total) by mouth daily. 30 tablet 0  . nitroGLYCERIN (NITROSTAT) 0.4 MG SL tablet Place 1 tablet (0.4 mg total) under the tongue every 5 (five) minutes as needed for chest pain. For maximum of 3 times. 35 tablet 3  . PROAIR HFA 108 (90 Base) MCG/ACT inhaler INHALE 1 TO 2 PUFFS INTO THE LUNGS EVERY 6 HOURS AS NEEDED FOR WHEEZING OR SHORTNESS OF BREATH 8.5 g 1  . sertraline (ZOLOFT) 25 MG tablet TAKE 1 TABLET BY MOUTH  DAILY 90 tablet 1  . valACYclovir (VALTREX) 1000 MG tablet TAKE 1 TABLET BY MOUTH TWICE DAILY (Patient taking differently: as needed) 10 tablet 5   Current Facility-Administered Medications on File Prior to Visit  Medication Dose Route Frequency Provider Last Rate Last Dose  . 0.9 %  sodium chloride infusion  500 mL Intravenous Continuous Ladene Artist, MD        No Known Allergies  Past Medical History:  Diagnosis Date  . Arthritis   . CAD (coronary artery disease)   . 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 Depression   . Ejection fraction   . GERD (gastroesophageal  reflux disease)   . Hyperlipidemia   . Hypertension   . PONV (postoperative nausea and vomiting)   . Skin cancer of face 05/2015   basal cell  . Stroke [facility]) 2006   deaf in right ear    Past Surgical History:  Procedure Laterality Date  . BREAST BIOPSY Left 1984   abcess  . CESAREAN SECTION    . CHOLECYSTECTOMY  1990  . CHONDROPLASTY Left 01/05/2015   Procedure: CHONDROPLASTY;  Surgeon: Yvette Rack., MD;  Location: [place];  Service: Orthopedics;  Laterality: Left;  . KNEE ARTHROSCOPY WITH MEDIAL MENISECTOMY Left 01/05/2015   Procedure: LEFT KNEE  ARTHROSCOPY WITH MEDIAL AND LATERAL MENISECTOMY; DEBRIDEMENT LATERAL PATELLA FEMORAL ;  Surgeon: Yvette Rack., MD;  Location: [place];  Service: Orthopedics;  Laterality: Left;  . LEFT HEART CATHETERIZATION WITH CORONARY ANGIOGRAM N/A 10/28/2013   Procedure: LEFT HEART CATHETERIZATION WITH CORONARY ANGIOGRAM;  Surgeon: Larey Dresser, MD;  Location: [facility] CATH LAB;  Service: Cardiovascular;  Laterality: N/A;  . TOTAL KNEE ARTHROPLASTY Left 07/06/2015   Procedure: TOTAL KNEE ARTHROPLASTY;  Surgeon: Earlie Server, MD;  Location: [place];  Service: Orthopedics;  Laterality: Left;  . TUBAL LIGATION      Family History  Problem Relation Age of Onset  . Healthy Mother   . Hypertension Mother   . Stroke Mother   . Stroke Father   . Heart failure Father   . Heart disease Father     CABG  . Ovarian cancer Paternal Grandmother   . Liver cancer Paternal Grandfather   . Stroke Maternal Grandmother   . Heart failure Maternal Grandmother   . Heart failure Maternal Grandfather   . Colon cancer Neg Hx   . Stomach cancer Neg Hx   . Rectal cancer Neg Hx   . Esophageal cancer Neg Hx   . Breast cancer Neg Hx     Social History   Social History  . Marital status: Divorced    Spouse name: [name]  . Number of children: 8  . Years of education: N/A   Occupational History  . Works full time at [employer] as [place]  . Smoking status: Never Smoker  . Smokeless tobacco: Never Used     Comment: LIVES WITH 2 SMOKERS   . Alcohol use Yes     Comment: 1 glass of wine per month  . Drug use: No  . Sexual activity: Not on file   Other Topics Concern  . Not on file   Social History Narrative   Divorced, but has fiancee.  Lives with fiancee in [place].  Moved here recently from [place], [place].     She 8 children- ages 3- 57.               The PMH, PSH, Social History, Family History, Medications, and allergies have been reviewed in  [facility], and have been updated if relevant.   Review of Systems  Constitutional: Negative.   Respiratory: Positive for cough and wheezing. Negative for shortness of breath and stridor.   Cardiovascular: Negative.  Negative for leg swelling.  Gastrointestinal: Negative.   Genitourinary: Negative.   Musculoskeletal: Negative.   Neurological: Negative.   Hematological: Negative.   Psychiatric/Behavioral: Negative.   All other systems reviewed and are negative.      Objective:    BP 104/60 (BP Location: Left Arm, Patient Position: Sitting, Cuff Size: Large)  Pulse 98   Temp 98.1 F (36.7 C) (Oral)   Resp 18   Wt 212 lb (96.2 kg)   SpO2 94%   BMI 38.78 kg/m   Wt Readings from Last 3 Encounters:  04/16/17 212 lb (96.2 kg)  04/02/17 216 lb 12 oz (98.3 kg)  03/31/17 214 lb 8 oz (97.3 kg)    Physical Exam  Constitutional: She is oriented to person, place, and time. She appears well-developed and well-nourished. No distress.  HENT:  Right front and maxillary sinus TTP  Eyes: Conjunctivae are normal.  Neck: Normal range of motion. Neck supple. Thyromegaly present.  Pulmonary/Chest: Effort normal and breath sounds normal. No respiratory distress. She has no wheezes. She has no rales.  Musculoskeletal: She exhibits no edema.  Lymphadenopathy:    She has no cervical adenopathy.  Neurological: She is alert and oriented to person, place, and time. No cranial nerve deficit.  Skin: Skin is warm and dry. She is not diaphoretic.  Psychiatric: She has a normal mood and affect. Her behavior is normal. Judgment and thought content normal.  Nursing note and vitals reviewed.         Assessment & Plan:   Cough No Follow-up on file.

## 2017-04-16 NOTE — Assessment & Plan Note (Signed)
Persistent.  Lung exam reassuring. At this point, etiology is likely multifactorial.  She did have a response to lasix but no true signs of volume overload and normal BNP. Symptoms now are more concerning for an infectious process as well.  Will place on doxycyline, promethazine dm for cough. Refer to pulmonary for further evaluation. Keep appt for echo on 05/03/17.

## 2017-04-16 NOTE — Assessment & Plan Note (Signed)
Thyroid US ordered today.

## 2017-04-20 ENCOUNTER — Other Ambulatory Visit: Payer: Self-pay

## 2017-04-20 ENCOUNTER — Other Ambulatory Visit: Payer: Self-pay | Admitting: Family Medicine

## 2017-04-20 MED ORDER — FUROSEMIDE 20 MG PO TABS: 20.0000 mg | ORAL_TABLET | Freq: Every day | ORAL | 1 refills | Status: DC

## 2017-04-20 NOTE — Telephone Encounter (Signed)
Both last filled 03-20-17 Last OV 04-16-17 Acute No Future OV

## 2017-04-20 NOTE — Telephone Encounter (Signed)
Left refill on voice mail at pharmacy  

## 2017-04-20 NOTE — Telephone Encounter (Signed)
Ok to refill Furosemide?

## 2017-04-21 ENCOUNTER — Telehealth: Payer: Self-pay | Admitting: Family Medicine

## 2017-04-21 NOTE — Telephone Encounter (Signed)
[facility] faxed paperwork needing to get clarification on paperwork Paperwork in [name] in box

## 2017-04-21 NOTE — Telephone Encounter (Signed)
[facility] needed clarification on page 1 and 2 In [name] in box

## 2017-04-23 ENCOUNTER — Ambulatory Visit
Admission: RE | Admit: 2017-04-23 | Discharge: 2017-04-23 | Disposition: A | Discharge status: Home / Self Care | Payer: 59 | Source: Ambulatory Visit | Attending: Family Medicine | Admitting: Family Medicine

## 2017-04-23 DIAGNOSIS — E041 Nontoxic single thyroid nodule: Secondary | ICD-10-CM | POA: Insufficient documentation

## 2017-04-28 NOTE — Telephone Encounter (Signed)
Copy for pt Copy for file Copy for scan Left message letting pt know paperwork has been faxed Paperwork faxed 5/1

## 2017-04-29 ENCOUNTER — Ambulatory Visit (INDEPENDENT_AMBULATORY_CARE_PROVIDER_SITE_OTHER): Payer: 59 | Admitting: Pulmonary Disease

## 2017-04-29 ENCOUNTER — Other Ambulatory Visit (INDEPENDENT_AMBULATORY_CARE_PROVIDER_SITE_OTHER): Payer: 59

## 2017-04-29 ENCOUNTER — Encounter: Payer: Self-pay | Admitting: Pulmonary Disease

## 2017-04-29 VITALS — BP 140/74 | HR 101 | Ht 62.0 in | Wt 208.2 lb

## 2017-04-29 DIAGNOSIS — R0602 Shortness of breath: Secondary | ICD-10-CM | POA: Diagnosis not present

## 2017-04-29 LAB — CBC WITH DIFFERENTIAL/PLATELET
BASOS PCT: 1 % (ref 0.0–3.0)
Basophils Absolute: 0.1 10*3/uL (ref 0.0–0.1)
EOS PCT: 3.3 % (ref 0.0–5.0)
Eosinophils Absolute: 0.3 10*3/uL (ref 0.0–0.7)
HEMATOCRIT: 43 % (ref 36.0–46.0)
HEMOGLOBIN: 14.7 g/dL (ref 12.0–15.0)
Lymphocytes Relative: 26 % (ref 12.0–46.0)
Lymphs Abs: 2 10*3/uL (ref 0.7–4.0)
MCHC: 34.3 g/dL (ref 30.0–36.0)
MCV: 95.9 fl (ref 78.0–100.0)
MONO ABS: 0.6 10*3/uL (ref 0.1–1.0)
MONOS PCT: 7.3 % (ref 3.0–12.0)
Neutro Abs: 4.8 10*3/uL (ref 1.4–7.7)
Neutrophils Relative %: 62.4 % (ref 43.0–77.0)
Platelets: 264 10*3/uL (ref 150.0–400.0)
RBC: 4.49 Mil/uL (ref 3.87–5.11)
RDW: 12.4 % (ref 11.5–15.5)
WBC: 7.8 10*3/uL (ref 4.0–10.5)

## 2017-04-29 LAB — NITRIC OXIDE: Nitric Oxide: 46

## 2017-04-29 MED ORDER — BUDESONIDE-FORMOTEROL FUMARATE 160-4.5 MCG/ACT IN AERO: 2.0000 | INHALATION_SPRAY | Freq: Two times a day (BID) | RESPIRATORY_TRACT | 6 refills | Status: DC

## 2017-04-29 MED ORDER — BUDESONIDE-FORMOTEROL FUMARATE 160-4.5 MCG/ACT IN AERO: 2.0000 | INHALATION_SPRAY | Freq: Two times a day (BID) | RESPIRATORY_TRACT | 0 refills | Status: DC

## 2017-04-29 MED ORDER — BUDESONIDE-FORMOTEROL FUMARATE 160-4.5 MCG/ACT IN AERO: 2.0000 | INHALATION_SPRAY | Freq: Two times a day (BID) | RESPIRATORY_TRACT | 3 refills | Status: DC

## 2017-04-29 NOTE — Patient Instructions (Signed)
We will check CBC with differential, blood allergy profile Check PFTs We will start you on Symbicort 160/4.5 Continue using the albuterol rescue inhaler  Return to clinic in 1-2 months

## 2017-04-29 NOTE — Progress Notes (Signed)
Diane [name]    [id number]    Apr 26, 1951  Primary Care Physician:Talia Deborra Medina, MD  Referring Physician: Lucille Passy, MD [place], [place] [address]  Chief complaint:  Consult for evaluation of dyspnea  HPI: Diane [name] is a 66 year old with past history of GERD, coronary artery disease, hypertension. She has complains of dyspnea for the past 2 months. Symptoms are mainly at exertion and at rest. She has productive cough with mucus production associated with wheezing. Has daily symptoms and uses albuterol up to 3 times. No nighttime awakenings.  She was evaluated in the ED in march 2018 for atypical chest pain associated with cough. She was evaluated by cardiology and started on Lasix. She also got 2 courses of antibiotics for the past few months with doxycycline and azithromycin. She has seasonal allergies with postnasal drip. She also has history of GERD, was evaluated by GI and Nexium increased to 40 mg twice a day . She underwent EGD on 02/05/17 which showed localized candidiasis and was treated with Diflucan.   Pets: Dogs, no birds, exotic animals. No farm animals Occupation: English as a second language teacher at [employer]. Exposures: No known exposures Smoking history: None  Outpatient Encounter Prescriptions as of 04/29/2017  Medication Sig  . aspirin EC 81 MG tablet Take 1 tablet (81 mg total) by mouth daily.  [name] atorvastatin (LIPITOR) 20 MG tablet Take 1 tablet (20 mg total) by mouth daily.  . benazepril-hydrochlorthiazide (LOTENSIN HCT) 20-25 MG tablet TAKE 1 TABLET BY MOUTH  EVERY DAY  . butalbital-acetaminophen-caffeine (FIORICET WITH CODEINE) 50-325-40-30 MG capsule TAKE 1 TO 2 CAPSULES BY MOUTH EVERY DAY AS NEEDED  . cholecalciferol (VITAMIN D) 1000 UNITS tablet Take 1,000 Units by mouth daily.  . diazepam (VALIUM) 5 MG tablet TAKE 1/2 TO 1 TABLET BY MOUTH EVERY 12 HOURS AS NEEDED  . esomeprazole (NEXIUM) 40 MG capsule Take 1 capsule (40 mg total) by mouth 2 (two) times daily  before a meal.  . ezetimibe (ZETIA) 10 MG tablet TAKE 1 TABLET BY MOUTH  DAILY  . furosemide (LASIX) 20 MG tablet Take 1 tablet (20 mg total) by mouth daily.  . nitroGLYCERIN (NITROSTAT) 0.4 MG SL tablet Place 1 tablet (0.4 mg total) under the tongue every 5 (five) minutes as needed for chest pain. For maximum of 3 times.  [name] PROAIR HFA 108 (90 Base) MCG/ACT inhaler INHALE 1 TO 2 PUFFS INTO THE LUNGS EVERY 6 HOURS AS NEEDED FOR WHEEZING OR SHORTNESS OF BREATH  . sertraline (ZOLOFT) 25 MG tablet TAKE 1 TABLET BY MOUTH  DAILY  . valACYclovir (VALTREX) 1000 MG tablet TAKE 1 TABLET BY MOUTH TWICE DAILY (Patient taking differently: as needed)  . [DISCONTINUED] chlorpheniramine-HYDROcodone (TUSSIONEX PENNKINETIC ER) 10-8 MG/5ML SUER Take 5 mLs by mouth every 12 (twelve) hours as needed.  . [DISCONTINUED] doxycycline (VIBRA-TABS) 100 MG tablet Take 1 tablet (100 mg total) by mouth 2 (two) times daily.  . [DISCONTINUED] promethazine-dextromethorphan (PROMETHAZINE-DM) 6.25-15 MG/5ML syrup Take 5 mLs by mouth 4 (four) times daily as needed.   Facility-Administered Encounter Medications as of 04/29/2017  Medication  . 0.9 %  sodium chloride infusion    Allergies as of 04/29/2017  . (No Known Allergies)    Past Medical History:  Diagnosis Date  . Arthritis   . CAD (coronary artery disease)   . 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 Deafness in right ear  from cva  . Depression   . Ejection fraction   . GERD (gastroesophageal reflux disease)   . Hyperlipidemia   . Hypertension   . PONV (postoperative nausea and vomiting)   . Skin cancer of face 05/2015   basal cell  . Stroke [facility]) 2006   deaf in right ear    Past Surgical History:  Procedure Laterality Date  . BREAST BIOPSY Left 1984   abcess  . CESAREAN SECTION    . CHOLECYSTECTOMY  1990  . CHONDROPLASTY Left 01/05/2015   Procedure: CHONDROPLASTY;  Surgeon: Yvette Rack., MD;  Location: [place];  Service: Orthopedics;  Laterality: Left;  . KNEE ARTHROSCOPY WITH MEDIAL MENISECTOMY Left 01/05/2015   Procedure: LEFT KNEE ARTHROSCOPY WITH MEDIAL AND LATERAL MENISECTOMY; DEBRIDEMENT LATERAL PATELLA FEMORAL ;  Surgeon: Yvette Rack., MD;  Location: [place];  Service: Orthopedics;  Laterality: Left;  . LEFT HEART CATHETERIZATION WITH CORONARY ANGIOGRAM N/A 10/28/2013   Procedure: LEFT HEART CATHETERIZATION WITH CORONARY ANGIOGRAM;  Surgeon: Larey Dresser, MD;  Location: [facility] CATH LAB;  Service: Cardiovascular;  Laterality: N/A;  . TOTAL KNEE ARTHROPLASTY Left 07/06/2015   Procedure: TOTAL KNEE ARTHROPLASTY;  Surgeon: Earlie Server, MD;  Location: [place];  Service: Orthopedics;  Laterality: Left;  . TUBAL LIGATION      Family History  Problem Relation Age of Onset  . Healthy Mother   . Hypertension Mother   . Stroke Mother   . Stroke Father   . Heart failure Father   . Heart disease Father     CABG  . Ovarian cancer Paternal Grandmother   . Liver cancer Paternal Grandfather   . Stroke Maternal Grandmother   . Heart failure Maternal Grandmother   . Heart failure Maternal Grandfather   . Colon cancer Neg Hx   . Stomach cancer Neg Hx   . Rectal cancer Neg Hx   . Esophageal cancer Neg Hx   . Breast cancer Neg Hx     Social History   Social History  . Marital status: Divorced    Spouse name: [name]  . Number of children: 8  . Years of education: N/A   Occupational History  . Works full time at [employer] as [place]  . Smoking status: Never Smoker  . Smokeless tobacco: Never Used     Comment: LIVES WITH 2 SMOKERS   . Alcohol use Yes     Comment: 1 glass of wine per month  . Drug use: No  . Sexual activity: Not on file   Other Topics Concern  . Not on file   Social History Narrative   Divorced, but has fiancee.  Lives with fiancee in [place].  Moved here recently from [place], [place].       She 8 children- ages 40- 17.                Review of systems: Review of Systems  Constitutional: Negative for fever and chills.  HENT: Negative.   Eyes: Negative for blurred vision.  Respiratory: as per HPI  Cardiovascular: Negative for chest pain and palpitations.  Gastrointestinal: Negative for vomiting, diarrhea, blood per rectum. Genitourinary: Negative for dysuria, urgency, frequency and hematuria.  Musculoskeletal: Negative for myalgias, back pain and joint pain.  Skin: Negative for itching and rash.  Neurological: Negative for dizziness, tremors, focal weakness, seizures and loss of consciousness.  Endo/Heme/Allergies: Negative for environmental allergies.  Psychiatric/Behavioral: Negative for depression, suicidal ideas and hallucinations.  All other systems reviewed and are negative.  Physical Exam: Blood pressure 140/74, pulse (!) 101, height 5\' 2"  (1.575 m), weight 208 lb 3.2 oz (94.4 kg), SpO2 97 %. Gen:      No acute distress HEENT:  EOMI, sclera anicteric Neck:     No masses; no thyromegaly Lungs:    Clear to auscultation bilaterally; normal respiratory effort CV:         Regular rate and rhythm; no murmurs Abd:      + bowel sounds; soft, non-tender; no palpable masses, no distension Ext:    No edema; adequate peripheral perfusion Skin:      Warm and dry; no rash Neuro: alert and oriented x 3 Psych: normal mood and affect  Data Reviewed: CTA 04/03/17- no pulmonary embolism, mild coronary artery constipation, no lung abnormalities, right thyroid nodule. I reviewed the images personally.  FENO 04/29/17 - 46  Assessment:  Evaluation for dyspnea, cough Likely has asthma, reactive airway disease. 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 I'll start her on Symbicort and continue the albuterol when necessary Get CBC with differential to evaluate for peripheral eosinophilia, blood allergy profile and pulmonary function  tests.  Upper airway cough She also has upper airway cough syndrome from allergic rhinitis, postnasal drip and GERD. She'll continue on the Nexium at current dose. I asked her to use over-the-counter Flonase and Zyrtec to deal with postnasal drip.  Plan/Recommendations: - Start Symbicort 160/4.5. Continue albuterol PRN - Check CBC with diff, blood allergy profile, PFTs - Continue nexium - Use OTC zyrtec and Flonase for post nasal drip   Marshell Garfinkel MD [facility] Pager [phone] 04/29/2017, 4:27 PM  CC: Lucille Passy, MD

## 2017-04-30 LAB — RESPIRATORY ALLERGY PROFILE REGION II ~~LOC~~
ALLERGEN, CEDAR TREE, T6: 0.29 kU/L — AB
ALLERGEN, COMM SILVER BIRCH, T3: 0.21 kU/L — AB
ALLERGEN, COTTONWOOD, T14: 0.31 kU/L — AB
ALLERGEN, MULBERRY, T70: 0.22 kU/L — AB
Allergen, D pternoyssinus,d7: <0.1 kU/L
Allergen, Mouse Urine Protein, e78: <0.1 kU/L
Allergen, Oak,t7: 0.39 kU/L — ABNORMAL HIGH
Allergen, P. notatum, m1: <0.1 kU/L
Aspergillus fumigatus, m3: <0.1 kU/L
Bermuda Grass: 0.4 kU/L — ABNORMAL HIGH
Box Elder IgE: 0.35 kU/L — ABNORMAL HIGH
COMMON RAGWEED: 0.37 kU/L — AB
Cockroach: 0.21 kU/L — ABNORMAL HIGH
D. farinae: <0.1 kU/L
Elm IgE: 0.39 kU/L — ABNORMAL HIGH
IgE (Immunoglobulin E), Serum: 162 kU/L — ABNORMAL HIGH (ref <115)
JOHNSON GRASS: 0.35 kU/L — AB
PECAN/HICKORY TREE IGE: 0.28 kU/L — AB
ROUGH PIGWEED IGE: 0.34 kU/L — AB
Sheep Sorrel IgE: 0.39 kU/L — ABNORMAL HIGH
Timothy Grass: 0.4 kU/L — ABNORMAL HIGH

## 2017-05-05 ENCOUNTER — Other Ambulatory Visit: Payer: Self-pay

## 2017-05-05 ENCOUNTER — Ambulatory Visit (INDEPENDENT_AMBULATORY_CARE_PROVIDER_SITE_OTHER): Payer: 59

## 2017-05-05 DIAGNOSIS — M7989 Other specified soft tissue disorders: Secondary | ICD-10-CM | POA: Diagnosis not present

## 2017-05-05 DIAGNOSIS — R0602 Shortness of breath: Secondary | ICD-10-CM

## 2017-05-07 ENCOUNTER — Telehealth: Payer: Self-pay

## 2017-05-07 LAB — ECHOCARDIOGRAM COMPLETE
AO mean calculated velocity dopler: 120 cm/s
AOVTI: 29.5 cm
AV Area VTI index: 0.99 cm2/m2
AV VEL mean LVOT/AV: 0.59
AVAREAMEANV: 1.5 cm2
AVAREAMEANVIN: 0.77 cm2/m2
AVG: 7 mmHg
Ao-asc: 32 cm
Area-P 1/2: 2.78 cm2
CHL CUP AV VALUE AREA INDEX: 0.99
CHL CUP AV VEL: 1.92
CHL CUP DOP CALC LVOT VTI: 22.3 cm
CHL CUP MV DEC (S): 271
E decel time: 271 msec
EERAT: 10.88
FS: 37 % (ref 28–44)
IVS/LV PW RATIO, ED: 1
LA ID, A-P, ES: 34 mm
LADIAMINDEX: 1.75 cm/m2
LAVOLA4C: 38.4 mL
LEFT ATRIUM END SYS DIAM: 34 mm
LV E/e' medial: 10.88
LV E/e'average: 10.88
LV PW d: 11 mm — AB (ref 0.6–1.1)
LV TDI E'LATERAL: 7.18
LV e' LATERAL: 7.18 cm/s
LVOT SV: 57 mL
LVOT area: 2.54 cm2
LVOT diameter: 18 mm
LVOTVTI: 0.76 cm
MV pk A vel: 112 m/s
MV pk E vel: 78.1 m/s
MVPG: 2 mmHg
P 1/2 time: 79 ms
RV LATERAL S' VELOCITY: 14.4 cm/s
TAPSE: 20.6 mm
TDI e' medial: 6.64
Valve area: 1.92 cm2

## 2017-05-07 MED ORDER — HYDROCOD POLST-CPM POLST ER 10-8 MG/5ML PO SUER: 5.0000 mL | Freq: Two times a day (BID) | ORAL | 0 refills | Status: DC | PRN

## 2017-05-07 NOTE — Telephone Encounter (Signed)
Pt left v/m; pt had been doing well on inhaler but began to cough again last night and pt request Hydrocodone Chlorphen ER suspension refilled. ? Tussionex; I do not see that on pt current or hx med list. (Hycodan on hx med list).Pt request cb when ready for pick up.Please advise. Pt last seen 04/16/17.

## 2017-05-07 NOTE — Telephone Encounter (Signed)
Pt left v/m; pt took tussionex rx to [facility] [address] and was advised needs prior auth done before can fill medication. For PA call [phone].

## 2017-05-07 NOTE — Telephone Encounter (Signed)
Tussionex rx printed.

## 2017-05-07 NOTE — Telephone Encounter (Signed)
Left message for Diane [name] that her prescription is ready to be picked up at the front desk.

## 2017-05-07 NOTE — Telephone Encounter (Signed)
Did PA on Cover My Meds: "[facility] is reviewing your PA request.  Typically an electronic response will be received within 72 hours."

## 2017-05-08 NOTE — Telephone Encounter (Signed)
Pt left 2nd v/m; pt spoke with pharmacy the form that was sent on 05/07/17 was the wrong form; has to be a plan limit override form. Pt cannot get med until get correct form. Pt request cb ASAP.

## 2017-05-08 NOTE — Telephone Encounter (Signed)
Spoke to pt. Advised her if it was not for her footwork calling us to tell us what she is needing, it would be done because we have never received anything from the pharmacy. I told her it would be this afternoon before I could get to her quantity limit PA. She said she understood.

## 2017-05-08 NOTE — Telephone Encounter (Signed)
Pt left v/m requesting cb with status of PA for tussionex.

## 2017-05-08 NOTE — Telephone Encounter (Signed)
Spoke to [place] at [facility]. He said the pt has reached her local pharmacy limit for the Tussionex and it would have to be sent to mail order unless the pt calls [phone] and asks for a local pharmacy overrride. I called the pt to let her know

## 2017-05-12 ENCOUNTER — Telehealth: Payer: Self-pay | Admitting: *Deleted

## 2017-05-12 DIAGNOSIS — R079 Chest pain, unspecified: Secondary | ICD-10-CM

## 2017-05-12 NOTE — Telephone Encounter (Signed)
Results called to pt. Pt verbalized understanding. Patient's chest pain is not worse. She is agreeable to [employer]. Appt for Lexiscan scheduled for 05/19/17, arrival 0715am. Reviewed instructions as follows and she verbalized understanding. F/u appt with Dr End rescheduled for later date.   [place]  Your caregiver has ordered a Stress Test with nuclear imaging. The purpose of this test is to evaluate the blood supply to your heart muscle. This procedure is referred to as a "Non-Invasive Stress Test." This is because other than having an IV started in your vein, nothing is inserted or "invades" your body. Cardiac stress tests are done to find areas of poor blood flow to the heart by determining the extent of coronary artery disease (CAD). Some patients exercise on a treadmill, which naturally increases the blood flow to your heart, while others who are  unable to walk on a treadmill due to physical limitations have a pharmacologic/chemical stress agent called Lexiscan . This medicine will mimic walking on a treadmill by temporarily increasing your coronary blood flow.   Please note: these test may take anywhere between 2-4 hours to complete  PLEASE REPORT TO [facility] AT THE FIRST DESK WILL DIRECT YOU WHERE TO GO  Date of Procedure:________05/22/18______________  Arrival Time for Procedure:______0715am_____________  Instructions regarding medication:   _X__:  Hold other medications as follows:___FUROSEMIDE_______    PLEASE NOTIFY THE OFFICE AT LEAST 24 HOURS IN ADVANCE IF YOU ARE UNABLE TO KEEP YOUR APPOINTMENT.  [phone] AND  PLEASE NOTIFY NUCLEAR MEDICINE AT [facility] AT LEAST 24 HOURS IN ADVANCE IF YOU ARE UNABLE TO KEEP YOUR APPOINTMENT. [phone]  How to prepare for your Myoview test:  1. Do not eat or drink after midnight 2. No caffeine for 24 hours prior to test 3. No smoking 24 hours prior to test. 4. Your medication may be taken with  water.  If your doctor stopped a medication because of this test, do not take that medication. 5. Ladies, please do not wear dresses.  Skirts or pants are appropriate. Please wear a short sleeve shirt. 6. No perfume, cologne or lotion. 7. Wear comfortable walking shoes. No heels!

## 2017-05-12 NOTE — Telephone Encounter (Signed)
-----   Message from Nelva Bush, MD sent at 05/11/2017  7:54 AM EDT ----- Please let Diane [name] know that her echo shows vigorous contraction of her heart without significant valvular abnormalities. I suggest that we obtain a Lexiscan Myoview to evaluate for progression of her coronary artery disease, given her recent chest pain. Unless her symptoms have worsened since our last visit, we should reschedule this week's clinic appointment until after the Myoview has been completed. Thanks.

## 2017-05-13 ENCOUNTER — Ambulatory Visit: Payer: 59 | Admitting: Internal Medicine

## 2017-05-18 ENCOUNTER — Telehealth: Payer: Self-pay | Admitting: *Deleted

## 2017-05-18 ENCOUNTER — Other Ambulatory Visit: Payer: Self-pay | Admitting: Family Medicine

## 2017-05-18 NOTE — Telephone Encounter (Signed)
FAXED TO  [facility] [phone], [address]  [place] [address]  Phone: [phone] Fax: [phone]

## 2017-05-18 NOTE — Telephone Encounter (Signed)
Called patient to remind her of lexiscan myoview tomorrow 05/19/17, arrival of 07:15 am. Reviewed instructions and she verbalized understanding.

## 2017-05-18 NOTE — Telephone Encounter (Signed)
Medicatins were last refilled on 04/20/17, last OV 04/16/17

## 2017-05-19 ENCOUNTER — Other Ambulatory Visit: Payer: Self-pay | Admitting: Internal Medicine

## 2017-05-19 ENCOUNTER — Encounter
Admission: RE | Admit: 2017-05-19 | Discharge: 2017-05-19 | Disposition: A | Discharge status: Home / Self Care | Payer: 59 | Source: Ambulatory Visit | Attending: Internal Medicine | Admitting: Internal Medicine

## 2017-05-19 DIAGNOSIS — R079 Chest pain, unspecified: Secondary | ICD-10-CM | POA: Diagnosis present

## 2017-05-19 LAB — NM MYOCAR MULTI W/SPECT W/WALL MOTION / EF
CHL CUP STRESS STAGE 4 DBP: 61 mmHg
CHL CUP STRESS STAGE 4 HR: 83 {beats}/min
CHL CUP STRESS STAGE 4 SBP: 114 mmHg
CSEPPMHR: 62 %
Estimated workload: 1 METS
LV dias vol: 45 mL (ref 46–106)
LV sys vol: 14 mL
Peak HR: 97 {beats}/min
Percent HR: 62 %
Rest HR: 77 {beats}/min
Stage 1 HR: 85 {beats}/min
Stage 2 HR: 85 {beats}/min
Stage 3 HR: 97 {beats}/min
TID: 1.36

## 2017-05-19 MED ORDER — REGADENOSON 0.4 MG/5ML IV SOLN
0.4000 mg | Freq: Once | INTRAVENOUS | Status: AC
  Administered 2017-05-19: 0.4 mg via INTRAVENOUS

## 2017-05-19 MED ORDER — TECHNETIUM TC 99M TETROFOSMIN IV KIT
13.0000 | PACK | Freq: Once | INTRAVENOUS | Status: AC | PRN
  Administered 2017-05-19: 12.483 via INTRAVENOUS

## 2017-05-19 MED ORDER — TECHNETIUM TC 99M TETROFOSMIN IV KIT
29.7100 | PACK | Freq: Once | INTRAVENOUS | Status: AC | PRN
  Administered 2017-05-19: 29.71 via INTRAVENOUS

## 2017-05-20 ENCOUNTER — Other Ambulatory Visit: Payer: Self-pay | Admitting: *Deleted

## 2017-05-20 MED ORDER — DILTIAZEM HCL ER COATED BEADS 180 MG PO CP24: 180.0000 mg | ORAL_CAPSULE | Freq: Every day | ORAL | 3 refills | Status: DC

## 2017-05-28 ENCOUNTER — Other Ambulatory Visit: Payer: Self-pay | Admitting: Family Medicine

## 2017-05-28 ENCOUNTER — Telehealth: Payer: Self-pay | Admitting: Pulmonary Disease

## 2017-05-28 NOTE — Telephone Encounter (Signed)
Notes recorded by Marshell Garfinkel, MD on 05/28/2017 at 8:36 AM EDT Allergy tests shows reactivity to grass, tree pollen. Continue current therapy and follow up in clinic. ---------- Spoke with pt, aware of results/recs.  Nothing further needed.

## 2017-05-29 NOTE — Telephone Encounter (Signed)
Last refill 12/05/16 #90 +1, last OV 04/16/17

## 2017-06-16 ENCOUNTER — Other Ambulatory Visit: Payer: Self-pay | Admitting: Family Medicine

## 2017-06-16 NOTE — Telephone Encounter (Signed)
Both medications were last refilled 05/18/18  Last OV 04/16/17 Ok to refill?

## 2017-06-17 ENCOUNTER — Encounter: Payer: Self-pay | Admitting: Internal Medicine

## 2017-06-17 ENCOUNTER — Ambulatory Visit (INDEPENDENT_AMBULATORY_CARE_PROVIDER_SITE_OTHER): Payer: 59 | Admitting: Internal Medicine

## 2017-06-17 VITALS — BP 126/80 | HR 73 | Ht 62.0 in | Wt 204.0 lb

## 2017-06-17 DIAGNOSIS — E785 Hyperlipidemia, unspecified: Secondary | ICD-10-CM | POA: Diagnosis not present

## 2017-06-17 DIAGNOSIS — R0602 Shortness of breath: Secondary | ICD-10-CM

## 2017-06-17 DIAGNOSIS — I25118 Atherosclerotic heart disease of native coronary artery with other forms of angina pectoris: Secondary | ICD-10-CM

## 2017-06-17 DIAGNOSIS — I10 Essential (primary) hypertension: Secondary | ICD-10-CM | POA: Diagnosis not present

## 2017-06-17 MED ORDER — FUROSEMIDE 20 MG PO TABS: 20.0000 mg | ORAL_TABLET | Freq: Every day | ORAL | 0 refills | Status: DC

## 2017-06-17 MED ORDER — ISOSORBIDE MONONITRATE ER 30 MG PO TB24: 15.0000 mg | ORAL_TABLET | Freq: Every day | ORAL | 3 refills | Status: DC

## 2017-06-17 MED ORDER — FUROSEMIDE 20 MG PO TABS: 20.0000 mg | ORAL_TABLET | Freq: Every day | ORAL | 3 refills | Status: DC

## 2017-06-17 NOTE — Patient Instructions (Signed)
Medication Instructions:  Your physician has recommended you make the following change in your medication:  1- START Imdur 15 mg  (0.5 tablet) by mouth once a day.   Labwork: Your physician recommends that you return for lab work in: TODAY (LIPID, ALT, BMP).    Testing/Procedures: NONE  Follow-Up: Your physician recommends that you schedule a follow-up appointment in: 3 MONTHS WITH DR END.  If you need a refill on your cardiac medications before your next appointment, please call your pharmacy.

## 2017-06-17 NOTE — Telephone Encounter (Signed)
rx faxed to  [place], [place] [phone] (Phone) [phone] (Fax)

## 2017-06-17 NOTE — Progress Notes (Signed)
Follow-up Outpatient Visit Date: 06/17/2017  Primary Care Provider: Lucille Passy, [place] [address]  Chief Complaint: Follow-up chest pain  HPI:  Diane [name] is a 66 y.o. year-old female with history of nonobstructive coronary artery disease, stroke, GERD, hypertension, and hyperlipidemia, who presents for follow-up of chest pain. I last saw her on 03/31/17, at which time she noted gradual improvement in chest pain following a emergency department visit. We subsequently obtain a transthoracic echocardiogram, demonstrating normal LV contraction. Myocardial perfusion stress test was low risk with small apical defect most likely representing artifact.  Today, Diane [name] reports feeling well. She has occasional episodes of exertional chest pain, that seemed to be worse when it is hot outside. Episodes last up to 30 minutes, the pain comes and goes during that time. It is nowhere as severe as what brought her to the emergency room. She notes that her breathing has improved since our last visit. She recently underwent allergy testing and was found to have multiple environmental allergies. She is now taking inhaler added by Dr. Vaughan Browner (pulmonary), and feels that this is helping her breathing. She has stable pillow orthopnea and occasional PND. She denies leg swelling. Her weight has been stable at home. She notes that her home blood pressures have been normal to borderline low denies lightheadedness as well as palpitations. She used to have daytime fatigue and notes that it is sometimes difficult for her to sleep at night.  --------------------------------------------------------------------------------------------------  Cardiovascular History & Procedures: Cardiovascular Problems:  Nonobstructive coronary artery disease by catheterization in 2013  Atypical chest pain  Heart failure  Risk Factors:  Known coronary artery disease, hypertension, hyperlipidemia, obesity, and  sedentary lifestyle  Cath/PCI:  LHC (10/28/13): LMCA normal. LAD with 30% proximal and 40% mid lesions. Large LCx without significant disease. Dominant RCA with 30% proximal stenosis. LVEF 55-60%.  CV Surgery:  None  EP Procedures and Devices:  None  Non-Invasive Evaluation(s):  Pharmacologic MPI (05/19/17): Low risk study with small in size, moderate in severity, reversible defect at the apex. This most likely represents shifting breast attenuation, though small area of ischemia cannot be excluded. LVEF >65%.  TTE (05/05/17): Normal LV size and wall thickness with LVEF 65-70%. Normal wall motion. Grade 1 diastolic dysfunction. Normal RV size and function. No significant valvular abnormalities.  Carotid Doppler (02/03/13): No significant extracranial carotid artery stenosis. Patent vertebral arteries with antegrade flow.  TTE (02/02/13): Normal LV size and wall thickness with LVEF of 55-60%. Normal LV wall motion. Grade 1 diastolic dysfunction. Normal RV size and function. No significant valvular abnormalities. Mild pulmonary hypertension.  Recent CV Pertinent Labs: Lab Results  Component Value Date   CHOL 290 (H) 11/24/2016   HDL 73 11/24/2016   LDLCALC 188 (H) 11/24/2016   TRIG 143 11/24/2016   CHOLHDL 4.0 11/24/2016   CHOLHDL 4.0 10/28/2013   INR 0.96 06/25/2015   BNP 14.4 04/02/2017   K 4.1 04/02/2017   MG 2.0 10/28/2013   BUN 18 04/02/2017   CREATININE 0.72 04/02/2017    Past medical and surgical history were reviewed and updated in EPIC.  Outpatient Encounter Prescriptions as of 06/17/2017  Medication Sig  . aspirin EC 81 MG tablet Take 1 tablet (81 mg total) by mouth daily.  [name] atorvastatin (LIPITOR) 20 MG tablet Take 1 tablet (20 mg total) by mouth daily.  . benazepril-hydrochlorthiazide (LOTENSIN HCT) 20-25 MG tablet TAKE 1 TABLET BY MOUTH  EVERY DAY  . budesonide-formoterol (SYMBICORT) 160-4.5  MCG/ACT inhaler Inhale 2 puffs into the lungs 2 (two) times daily.  .  butalbital-acetaminophen-caffeine (FIORICET WITH CODEINE) 50-325-40-30 MG capsule TAKE 1 TO 2 CAPSULES BY MOUTH EVERY DAY AS NEEDED FOR HEADACHE  . chlorpheniramine-HYDROcodone (TUSSIONEX PENNKINETIC ER) 10-8 MG/5ML SUER Take 5 mLs by mouth every 12 (twelve) hours as needed.  . cholecalciferol (VITAMIN D) 1000 UNITS tablet Take 1,000 Units by mouth daily.  . diazepam (VALIUM) 5 MG tablet TAKE 1/2 TO 1 TABLET BY MOUTH EVERY 12 HOURS AS NEEDED  . diltiazem (CARDIZEM CD) 180 MG 24 hr capsule Take 1 capsule (180 mg total) by mouth daily.  [name] esomeprazole (NEXIUM) 40 MG capsule Take 1 capsule (40 mg total) by mouth 2 (two) times daily before a meal.  . furosemide (LASIX) 20 MG tablet Take 1 tablet (20 mg total) by mouth daily.  . nitroGLYCERIN (NITROSTAT) 0.4 MG SL tablet PLACE 1 TABLET (0.4 MG  TOTAL) UNDER THE TONGUE  EVERY 5 (FIVE) MINUTES AS  NEEDED FOR CHEST PAIN. FOR  MAXIMUM OF 3 TIMES.  [name] PROAIR HFA 108 (90 Base) MCG/ACT inhaler INHALE 1 TO 2 PUFFS INTO THE LUNGS EVERY 6 HOURS AS NEEDED FOR WHEEZING OR SHORTNESS OF BREATH  . sertraline (ZOLOFT) 25 MG tablet TAKE 1 TABLET BY MOUTH  DAILY  . valACYclovir (VALTREX) 1000 MG tablet TAKE 1 TABLET BY MOUTH TWICE DAILY (Patient taking differently: as needed)  . [DISCONTINUED] budesonide-formoterol (SYMBICORT) 160-4.5 MCG/ACT inhaler Inhale 2 puffs into the lungs 2 (two) times daily. (Patient not taking: Reported on 06/17/2017)  . [DISCONTINUED] ezetimibe (ZETIA) 10 MG tablet TAKE 1 TABLET BY MOUTH  DAILY (Patient not taking: Reported on 06/17/2017)   Facility-Administered Encounter Medications as of 06/17/2017  Medication  . 0.9 %  sodium chloride infusion    Allergies: Patient has no known allergies.  Social History   Social History  . Marital status: Significant Other    Spouse name: [name]  . Number of children: 8  . Years of education: N/A   Occupational History  . Works full time at [employer] as [employer]  . Smoking status: Never Smoker  . Smokeless tobacco: Never Used     Comment: LIVES WITH 2 SMOKERS   . Alcohol use Yes     Comment: 1 glass of wine per month  . Drug use: No  . Sexual activity: Not on file   Other Topics Concern  . Not on file   Social History Narrative   Divorced, but has fiancee.  Lives with fiancee in [place].  Moved here recently from [place], [place].     She 8 children- ages 62- 40.                Family History  Problem Relation Age of Onset  . Healthy Mother   . Hypertension Mother   . Stroke Mother   . Stroke Father   . Heart failure Father   . Heart disease Father        CABG  . Ovarian cancer Paternal Grandmother   . Liver cancer Paternal Grandfather   . Stroke Maternal Grandmother   . Heart failure Maternal Grandmother   . Heart failure Maternal Grandfather   . Colon cancer Neg Hx   . Stomach cancer Neg Hx   . Rectal cancer Neg Hx   . Esophageal cancer Neg Hx   . Breast cancer Neg Hx     Review of Systems: A  12-system review of systems was performed and was negative except as noted in the HPI.  --------------------------------------------------------------------------------------------------  Physical Exam: BP 126/80 (BP Location: Left Arm, Patient Position: Sitting, Cuff Size: Normal)   Pulse 73   Ht 5\' 2"  (1.575 m)   Wt 204 lb (92.5 kg)   BMI 37.31 kg/m   General:  Obese woman, seated comfortably in the exam room. HEENT: No conjunctival pallor or scleral icterus.  Moist mucous membranes.  OP clear. Neck: Supple without lymphadenopathy, thyromegaly, JVD, or HJR. Lungs: Normal work of breathing.  Clear to auscultation bilaterally without wheezes or crackles. Fair air movement. Heart: Regular rate and rhythm without murmurs, rubs, or gallops.  Unable to assess PMI due to body habitus. Abd: Bowel sounds present.  Soft, NT/ND. Unable to assess HSM due to body habitus. Ext: No lower extremity edema.  Radial, PT, and DP  pulses are 2+ bilaterally. Skin: warm and dry without rash  EKG:  Normal sinus rhythm with borderline LVH.  Lab Results  Component Value Date   WBC 7.8 04/29/2017   HGB 14.7 04/29/2017   HCT 43.0 04/29/2017   MCV 95.9 04/29/2017   PLT 264.0 04/29/2017    Lab Results  Component Value Date   NA 136 04/02/2017   K 4.1 04/02/2017   CL 88 (L) 04/02/2017   CO2 31 (H) 04/02/2017   BUN 18 04/02/2017   CREATININE 0.72 04/02/2017   GLUCOSE 104 (H) 04/02/2017   ALT 36 (H) 04/02/2017    Lab Results  Component Value Date   CHOL 290 (H) 11/24/2016   HDL 73 11/24/2016   LDLCALC 188 (H) 11/24/2016   TRIG 143 11/24/2016   CHOLHDL 4.0 11/24/2016    --------------------------------------------------------------------------------------------------  ASSESSMENT AND PLAN: Coronary artery disease with stable angina Diane [name] was previously noted to have nonobstructive CAD. Myocardial perfusion stress test after her last visit was reassuring with a small defect at the apex most likely representing artifact and less likely ischemia. Her chest pain and dyspnea have improved with medical therapy, including addition of inhalers by Dr. Vaughan Browner. It is possible that she has a degree of microvascular dysfunction.   Shortness of breath This is likely multifactorial, including underlying lung disease, deconditioning, and diastolic dysfunction. I have encouraged Diane [name] to continue her current medication regimen. She may also have underlying sleep apnea, given her fatigue and poor sleep at night. I encouraged her to speak with Dr. Vaughan Browner about the utility of a sleep study. I will check a basic metabolic panel today to reassess her electrolytes, given addition of furosemide  Essential hypertension Blood pressure is easily well controlled today, the diastolic reading is upper normal. As above, we will add low-dose isosorbide mononitrate.  Hyperlipidemia History of stroke and nonobstructive CAD,  patient's LDL goal is less than 70. Most recent lipid panel in 10/2016 showed an LDL of 188. She is currently tolerating atorvastatin well. We will recheck a lipid panel and ALT today.  Follow-up: Return to clinic in 3 months.  Nelva Bush, MD 06/19/2017 7:37 AM

## 2017-06-17 NOTE — Telephone Encounter (Signed)
RX placed in blue folder ready for pick up,left detailed message for pt.

## 2017-06-18 LAB — BASIC METABOLIC PANEL
BUN/Creatinine Ratio: 15 (ref 12–28)
BUN: 11 mg/dL (ref 8–27)
CHLORIDE: 94 mmol/L — AB (ref 96–106)
CO2: 22 mmol/L (ref 20–29)
CREATININE: 0.74 mg/dL (ref 0.57–1.00)
Calcium: 10.1 mg/dL (ref 8.7–10.3)
GFR calc Af Amer: 99 mL/min/{1.73_m2} (ref >59)
GFR calc non Af Amer: 86 mL/min/{1.73_m2} (ref >59)
GLUCOSE: 101 mg/dL — AB (ref 65–99)
Potassium: 3.5 mmol/L (ref 3.5–5.2)
SODIUM: 139 mmol/L (ref 134–144)

## 2017-06-18 LAB — LIPID PANEL
Chol/HDL Ratio: 2.5 ratio (ref 0.0–4.4)
Cholesterol, Total: 193 mg/dL (ref 100–199)
HDL: 78 mg/dL (ref >39)
LDL CALC: 91 mg/dL (ref 0–99)
Triglycerides: 118 mg/dL (ref 0–149)
VLDL Cholesterol Cal: 24 mg/dL (ref 5–40)

## 2017-06-18 LAB — ALT: ALT: 35 IU/L — AB (ref 0–32)

## 2017-06-19 ENCOUNTER — Encounter: Payer: Self-pay | Admitting: Internal Medicine

## 2017-06-19 ENCOUNTER — Other Ambulatory Visit: Payer: Self-pay | Admitting: Family Medicine

## 2017-06-22 ENCOUNTER — Other Ambulatory Visit: Payer: Self-pay | Admitting: *Deleted

## 2017-06-22 MED ORDER — ATORVASTATIN CALCIUM 40 MG PO TABS: 40.0000 mg | ORAL_TABLET | Freq: Every day | ORAL | 3 refills | Status: DC

## 2017-06-29 ENCOUNTER — Telehealth: Payer: Self-pay | Admitting: Internal Medicine

## 2017-06-29 NOTE — Telephone Encounter (Signed)
Please call pt, she has a question regarding Isosorbide dosage

## 2017-06-29 NOTE — Telephone Encounter (Signed)
Left message on pt's cell VM to take 15mg  (1/2 tablet) of imdur once daily. Left call back number if any further questions

## 2017-06-29 NOTE — Telephone Encounter (Signed)
Left message on machine for patient to contact the office.   

## 2017-07-08 ENCOUNTER — Other Ambulatory Visit: Payer: Self-pay | Admitting: Family Medicine

## 2017-07-14 ENCOUNTER — Encounter: Payer: Self-pay | Admitting: Pulmonary Disease

## 2017-07-14 ENCOUNTER — Ambulatory Visit (INDEPENDENT_AMBULATORY_CARE_PROVIDER_SITE_OTHER): Payer: 59 | Admitting: Pulmonary Disease

## 2017-07-14 VITALS — BP 126/74 | HR 81 | Ht 63.0 in | Wt 211.2 lb

## 2017-07-14 DIAGNOSIS — R0602 Shortness of breath: Secondary | ICD-10-CM

## 2017-07-14 LAB — PULMONARY FUNCTION TEST
DL/VA % pred: 129 %
DL/VA: 6.07 ml/min/mmHg/L
DLCO COR % PRED: 104 %
DLCO COR: 23.9 ml/min/mmHg
DLCO UNC: 25.31 ml/min/mmHg
DLCO unc % pred: 110 %
FEF 25-75 POST: 3.75 L/s
FEF 25-75 Pre: 3.29 L/sec
FEF2575-%Change-Post: 14 %
FEF2575-%PRED-PRE: 158 %
FEF2575-%Pred-Post: 180 %
FEV1-%CHANGE-POST: 4 %
FEV1-%PRED-POST: 92 %
FEV1-%Pred-Pre: 88 %
FEV1-POST: 2.15 L
FEV1-Pre: 2.06 L
FEV1FVC-%Change-Post: 3 %
FEV1FVC-%PRED-PRE: 114 %
FEV6-%Change-Post: 0 %
FEV6-%PRED-POST: 80 %
FEV6-%Pred-Pre: 79 %
FEV6-POST: 2.35 L
FEV6-PRE: 2.33 L
FEV6FVC-%Pred-Post: 104 %
FEV6FVC-%Pred-Pre: 104 %
FVC-%Change-Post: 0 %
FVC-%PRED-POST: 77 %
FVC-%PRED-PRE: 76 %
FVC-PRE: 2.33 L
FVC-Post: 2.35 L
POST FEV6/FVC RATIO: 100 %
Post FEV1/FVC ratio: 91 %
Pre FEV1/FVC ratio: 88 %
Pre FEV6/FVC Ratio: 100 %
RV % pred: 169 %
RV: 3.47 L
TLC % PRED: 126 %
TLC: 6.21 L

## 2017-07-14 LAB — NITRIC OXIDE: NITRIC OXIDE: 17

## 2017-07-14 MED ORDER — BUDESONIDE-FORMOTEROL FUMARATE 80-4.5 MCG/ACT IN AERO: 2.0000 | INHALATION_SPRAY | Freq: Two times a day (BID) | RESPIRATORY_TRACT | 12 refills | Status: DC

## 2017-07-14 NOTE — Patient Instructions (Addendum)
We will reduce the dose of Symbicort 80/4.5. Continue albuterol  Return in 6 months

## 2017-07-14 NOTE — Progress Notes (Signed)
PFT done today. 

## 2017-07-14 NOTE — Progress Notes (Signed)
Diane [name]    [id number]    03/09/1951  Primary Care Physician:Aron, Marciano Sequin, MD  Referring Physician: Lucille Passy, MD [place], [place] [address]  Chief complaint:   Follow up mild persistent asthma  HPI: Diane [name] is a 66 year old with past history of GERD, coronary artery disease, hypertension. She has complains of dyspnea for the past 2 months. Symptoms are mainly at exertion and at rest. She has productive cough with mucus production associated with wheezing. Has daily symptoms and uses albuterol up to 3 times. No nighttime awakenings.  She was evaluated in the ED in march 2018 for atypical chest pain associated with cough. She was evaluated by cardiology and started on Lasix. She also got 2 courses of antibiotics for the past few months with doxycycline and azithromycin. She has seasonal allergies with postnasal drip. She also has history of GERD, was evaluated by GI and Nexium increased to 40 mg twice a day . She underwent EGD on 02/05/17 which showed localized candidiasis and was treated with Diflucan.   Pets: Dogs, no birds, exotic animals. No farm animals Occupation: English as a second language teacher at [employer]. Exposures: No known exposures Smoking history: None  Interim history: Diane [name] was started on Symbicort at last visit with improvement in her dyspnea and is not albuterol inhaler everyday. She reports gaining some weight and feels this is affecting her breathing. She's had a cardiac stress test did not show any ischemia.  Outpatient Encounter Prescriptions as of 07/14/2017  Medication Sig  . aspirin EC 81 MG tablet Take 1 tablet (81 mg total) by mouth daily.  [name] atorvastatin (LIPITOR) 40 MG tablet Take 1 tablet (40 mg total) by mouth daily.  . benazepril-hydrochlorthiazide (LOTENSIN HCT) 20-25 MG tablet TAKE 1 TABLET BY MOUTH  EVERY DAY  . budesonide-formoterol (SYMBICORT) 160-4.5 MCG/ACT inhaler Inhale 2 puffs into the lungs 2 (two) times daily.  .  butalbital-acetaminophen-caffeine (FIORICET WITH CODEINE) 50-325-40-30 MG capsule TAKE 1 TO 2 CAPSULES BY MOUTH EVERY DAY AS NEEDED FOR HEADACHE  . chlorpheniramine-HYDROcodone (TUSSIONEX PENNKINETIC ER) 10-8 MG/5ML SUER Take 5 mLs by mouth every 12 (twelve) hours as needed.  . cholecalciferol (VITAMIN D) 1000 UNITS tablet Take 1,000 Units by mouth daily.  . diazepam (VALIUM) 5 MG tablet TAKE 1/2 TO 1 TABLET BY MOUTH EVERY 12 HOURS AS NEEDED  . diltiazem (CARDIZEM CD) 180 MG 24 hr capsule Take 1 capsule (180 mg total) by mouth daily.  [name] esomeprazole (NEXIUM) 40 MG capsule Take 1 capsule (40 mg total) by mouth 2 (two) times daily before a meal.  . furosemide (LASIX) 20 MG tablet Take 1 tablet (20 mg total) by mouth daily.  . nitroGLYCERIN (NITROSTAT) 0.4 MG SL tablet PLACE 1 TABLET (0.4 MG  TOTAL) UNDER THE TONGUE  EVERY 5 (FIVE) MINUTES AS  NEEDED FOR CHEST PAIN. FOR  MAXIMUM OF 3 TIMES.  [name] PROAIR HFA 108 (90 Base) MCG/ACT inhaler INHALE 1 TO 2 PUFFS INTO THE LUNGS EVERY 6 HOURS AS NEEDED FOR WHEEZING OR SHORTNESS OF BREATH  . sertraline (ZOLOFT) 25 MG tablet TAKE 1 TABLET BY MOUTH  DAILY  . valACYclovir (VALTREX) 1000 MG tablet TAKE 1 TABLET BY MOUTH TWICE DAILY (Patient taking differently: as needed)  . [DISCONTINUED] furosemide (LASIX) 20 MG tablet Take 1 tablet (20 mg total) by mouth daily.  . [DISCONTINUED] ezetimibe (ZETIA) 10 MG tablet TAKE 1 TABLET BY MOUTH  DAILY (Patient not taking: Reported on 07/14/2017)  . [  DISCONTINUED] isosorbide mononitrate (IMDUR) 30 MG 24 hr tablet Take 0.5 tablets (15 mg total) by mouth daily. (Patient not taking: Reported on 07/14/2017)   Facility-Administered Encounter Medications as of 07/14/2017  Medication  . 0.9 %  sodium chloride infusion    Allergies as of 07/14/2017  . (No Known Allergies)    Past Medical History:  Diagnosis Date  . Arthritis   . CAD (coronary artery disease)   . CVA (cerebral infarction) 1997   right sided weakeness and deaf in  right ear  . 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 Depression   . Ejection fraction   . GERD (gastroesophageal reflux disease)   . Hyperlipidemia   . Hypertension   . PONV (postoperative nausea and vomiting)   . Skin cancer of face 05/2015   basal cell  . Stroke [facility]) 2006   deaf in right ear    Past Surgical History:  Procedure Laterality Date  . BREAST BIOPSY Left 1984   abcess  . CESAREAN SECTION    . CHOLECYSTECTOMY  1990  . CHONDROPLASTY Left 01/05/2015   Procedure: CHONDROPLASTY;  Surgeon: Yvette Rack., MD;  Location: [facility];  Service: Orthopedics;  Laterality: Left;  . KNEE ARTHROSCOPY WITH MEDIAL MENISECTOMY Left 01/05/2015   Procedure: LEFT KNEE ARTHROSCOPY WITH MEDIAL AND LATERAL MENISECTOMY; DEBRIDEMENT LATERAL PATELLA FEMORAL ;  Surgeon: Yvette Rack., MD;  Location: [facility];  Service: Orthopedics;  Laterality: Left;  . LEFT HEART CATHETERIZATION WITH CORONARY ANGIOGRAM N/A 10/28/2013   Procedure: LEFT HEART CATHETERIZATION WITH CORONARY ANGIOGRAM;  Surgeon: Larey Dresser, MD;  Location: [facility] CATH LAB;  Service: Cardiovascular;  Laterality: N/A;  . TOTAL KNEE ARTHROPLASTY Left 07/06/2015   Procedure: TOTAL KNEE ARTHROPLASTY;  Surgeon: Earlie Server, MD;  Location: [place];  Service: Orthopedics;  Laterality: Left;  . TUBAL LIGATION      Family History  Problem Relation Age of Onset  . Healthy Mother   . Hypertension Mother   . Stroke Mother   . Stroke Father   . Heart failure Father   . Heart disease Father        CABG  . Ovarian cancer Paternal Grandmother   . Liver cancer Paternal Grandfather   . Stroke Maternal Grandmother   . Heart failure Maternal Grandmother   . Heart failure Maternal Grandfather   . Colon cancer Neg Hx   . Stomach cancer Neg Hx   . Rectal cancer Neg Hx   . Esophageal cancer Neg Hx   . Breast cancer Neg Hx     Social History   Social History  . Marital status: Significant Other    Spouse name:  [name]  . Number of children: 8  . Years of education: N/A   Occupational History  . Works full time at [employer] as [place]  . Smoking status: Never Smoker  . Smokeless tobacco: Never Used     Comment: LIVES WITH 2 SMOKERS   . Alcohol use Yes     Comment: 1 glass of wine per month  . Drug use: No  . Sexual activity: Not on file   Other Topics Concern  . Not on file   Social History Narrative   Divorced, but has fiancee.  Lives with fiancee in [place].  Moved here recently from [place], [place].     She 8 children- ages 56- 16.  Review of systems: Review of Systems  Constitutional: Negative for fever and chills.  HENT: Negative.   Eyes: Negative for blurred vision.  Respiratory: as per HPI  Cardiovascular: Negative for chest pain and palpitations.  Gastrointestinal: Negative for vomiting, diarrhea, blood per rectum. Genitourinary: Negative for dysuria, urgency, frequency and hematuria.  Musculoskeletal: Negative for myalgias, back pain and joint pain.  Skin: Negative for itching and rash.  Neurological: Negative for dizziness, tremors, focal weakness, seizures and loss of consciousness.  Endo/Heme/Allergies: Negative for environmental allergies.  Psychiatric/Behavioral: Negative for depression, suicidal ideas and hallucinations.  All other systems reviewed and are negative.  Physical Exam: Blood pressure 126/74, pulse 81, height 5\' 3"  (1.6 m), weight 211 lb 3.2 oz (95.8 kg), SpO2 97 %. Gen:      No acute distress HEENT:  EOMI, sclera anicteric Neck:     No masses; no thyromegaly Lungs:    Clear to auscultation bilaterally; normal respiratory effort CV:         Regular rate and rhythm; no murmurs Abd:      + bowel sounds; soft, non-tender; no palpable masses, no distension Ext:    No edema; adequate peripheral perfusion Skin:      Warm and dry; no rash Neuro: alert and oriented x 3 Psych: normal mood and  affect  Data Reviewed: CTA 04/03/17- no pulmonary embolism, mild coronary artery constipation, no lung abnormalities, right thyroid nodule. I reviewed the images personally.  FENO 04/29/17 - 46 FENO 07/14/17-   PFD 7/17/1 8 FVC 2.35 (77%) FEV1 2.15 [92%) F/F 91 TLC 126 DLCO corrected 129% Mild hyperinflation with elevated diffusion capacity  CBC with differential 04/29/17- WBC 7.8, eosinophils 3.3 percent, absolute eosinophil count 300 Blood allergy profile 04/29/17- Allergic to tree pollen, grass, cockroach, mites. IgE 162  Cardiac stress test 5/22/1 8-low risk study.  Assessment:  Mild persistent asthma Has good response to Symbicort. She is concerned about weight gain with the ICS use. Since FENO is better we will reduce Symbicort dosage. Continue albuterol PRN.  Encouraged her to work on weight loss and exercise which will improve her dyspnea.  Upper airway cough She also has upper airway cough syndrome from allergic rhinitis, postnasal drip and GERD. She'll continue on the Nexium at current dose. I asked her to use over-the-counter Flonase and Zyrtec to deal with postnasal drip.  Plan/Recommendations: - Change symbicort to 80/4.5. Continue albuterol PRN - Continue nexium - Use OTC zyrtec and Flonase for post nasal drip - Weight loss and exercise   Marshell Garfinkel MD  Pulmonary and Critical Care Pager [phone] 07/14/2017, 4:23 PM  CC: Lucille Passy, MD

## 2017-07-15 ENCOUNTER — Other Ambulatory Visit: Payer: Self-pay | Admitting: Family Medicine

## 2017-07-15 NOTE — Telephone Encounter (Signed)
Last refill 06/16/17 Last OV 04/16/17 Ok to refill?

## 2017-07-15 NOTE — Telephone Encounter (Signed)
Rx called in to requested pharmacy 

## 2017-07-20 ENCOUNTER — Other Ambulatory Visit: Payer: Self-pay | Admitting: *Deleted

## 2017-07-20 MED ORDER — DILTIAZEM HCL ER COATED BEADS 180 MG PO CP24: 180.0000 mg | ORAL_CAPSULE | Freq: Every day | ORAL | 3 refills | Status: DC

## 2017-07-28 ENCOUNTER — Telehealth: Payer: Self-pay | Admitting: Family Medicine

## 2017-07-28 ENCOUNTER — Other Ambulatory Visit (INDEPENDENT_AMBULATORY_CARE_PROVIDER_SITE_OTHER): Payer: 59

## 2017-07-28 ENCOUNTER — Encounter: Payer: Self-pay | Admitting: Family Medicine

## 2017-07-28 ENCOUNTER — Ambulatory Visit (INDEPENDENT_AMBULATORY_CARE_PROVIDER_SITE_OTHER): Payer: 59 | Admitting: Family Medicine

## 2017-07-28 VITALS — BP 140/90 | HR 97 | Ht 62.0 in | Wt 211.0 lb

## 2017-07-28 DIAGNOSIS — R5383 Other fatigue: Secondary | ICD-10-CM | POA: Diagnosis not present

## 2017-07-28 DIAGNOSIS — F411 Generalized anxiety disorder: Secondary | ICD-10-CM

## 2017-07-28 MED ORDER — SERTRALINE HCL 50 MG PO TABS: 50.0000 mg | ORAL_TABLET | Freq: Every day | ORAL | 3 refills | Status: DC

## 2017-07-28 NOTE — Assessment & Plan Note (Signed)
Deteriorated. >25 minutes spent in face to face time with patient, >50% spent in counselling or coordination of care Increase zoloft to 50 mg daily. Restart water aerobics and psychotherapy. Discussed breathing techniques. Call or return to clinic prn if these symptoms worsen or fail to improve as anticipated. The patient indicates understanding of these issues and agrees with the plan.

## 2017-07-28 NOTE — Progress Notes (Signed)
Subjective:   Patient ID: Diane [name], female    DOB: 1951/02/17, 66 y.o.   MRN: [id number]  DALYLAH [name] is a pleasant 66 y.o. year old female who presents to clinic today with Medication Management and Anxiety  on 07/28/2017  HPI:  Anxiety- in 02/2015- started on zoloft 25 mg daily, weaned off celexa and refilled valium to use prn severe anxiety.   Also referred for psychotherapy.  AT 10/2016 OV, she felt symptoms were well controlled and denied any symptoms of anxiety or depression.  At that time, she had changed to a less stressful job.  Past few months, increased panic attacks, feels more depressed as well.  No SI or HI but does not feel driven to do anything.  Thinking about restarting water aerobics.   Current Outpatient Prescriptions on File Prior to Visit  Medication Sig Dispense Refill  . aspirin EC 81 MG tablet Take 1 tablet (81 mg total) by mouth daily. 90 tablet 3  . atorvastatin (LIPITOR) 40 MG tablet Take 1 tablet (40 mg total) by mouth daily. 90 tablet 3  . benazepril-hydrochlorthiazide (LOTENSIN HCT) 20-25 MG tablet TAKE 1 TABLET BY MOUTH  EVERY DAY 90 tablet 1  . budesonide-formoterol (SYMBICORT) 80-4.5 MCG/ACT inhaler Inhale 2 puffs into the lungs 2 (two) times daily. 1 Inhaler 12  . butalbital-acetaminophen-caffeine (FIORICET WITH CODEINE) 50-325-40-30 MG capsule TAKE 1 TO 2 CAPSULES BY MOUTH EVERY DAY AS NEEDED FOR HEADACHE 30 capsule 0  . chlorpheniramine-HYDROcodone (TUSSIONEX PENNKINETIC ER) 10-8 MG/5ML SUER Take 5 mLs by mouth every 12 (twelve) hours as needed. 140 mL 0  . cholecalciferol (VITAMIN D) 1000 UNITS tablet Take 1,000 Units by mouth daily.    . diazepam (VALIUM) 5 MG tablet TAKE 1/2 TO 1 TABLET BY MOUTH EVERY 12 HOURS AS NEEDED 60 tablet 0  . diltiazem (CARDIZEM CD) 180 MG 24 hr capsule Take 1 capsule (180 mg total) by mouth daily. 90 capsule 3  . esomeprazole (NEXIUM) 40 MG capsule Take 1 capsule (40 mg total) by mouth 2 (two) times daily  before a meal. 60 capsule 5  . furosemide (LASIX) 20 MG tablet Take 1 tablet (20 mg total) by mouth daily. 90 tablet 3  . nitroGLYCERIN (NITROSTAT) 0.4 MG SL tablet PLACE 1 TABLET (0.4 MG  TOTAL) UNDER THE TONGUE  EVERY 5 (FIVE) MINUTES AS  NEEDED FOR CHEST PAIN. FOR  MAXIMUM OF 3 TIMES. 25 tablet 3  . PROAIR HFA 108 (90 Base) MCG/ACT inhaler INHALE 1 TO 2 PUFFS INTO THE LUNGS EVERY 6 HOURS AS NEEDED FOR WHEEZING OR SHORTNESS OF BREATH 8.5 g 1  . valACYclovir (VALTREX) 1000 MG tablet TAKE 1 TABLET BY MOUTH TWICE DAILY (Patient taking differently: as needed) 10 tablet 5   Current Facility-Administered Medications on File Prior to Visit  Medication Dose Route Frequency Provider Last Rate Last Dose  . 0.9 %  sodium chloride infusion  500 mL Intravenous Continuous Ladene Artist, MD        No Known Allergies  Past Medical History:  Diagnosis Date  . Arthritis   . CAD (coronary artery disease)   . 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 Depression   . Ejection fraction   . GERD (gastroesophageal reflux disease)   . Hyperlipidemia   . Hypertension   . PONV (postoperative nausea and vomiting)   . Skin cancer  of face 05/2015   basal cell  . Stroke [facility]) 2006   deaf in right ear    Past Surgical History:  Procedure Laterality Date  . BREAST BIOPSY Left 1984   abcess  . CESAREAN SECTION    . CHOLECYSTECTOMY  1990  . CHONDROPLASTY Left 01/05/2015   Procedure: CHONDROPLASTY;  Surgeon: Yvette Rack., MD;  Location: [place];  Service: Orthopedics;  Laterality: Left;  . KNEE ARTHROSCOPY WITH MEDIAL MENISECTOMY Left 01/05/2015   Procedure: LEFT KNEE ARTHROSCOPY WITH MEDIAL AND LATERAL MENISECTOMY; DEBRIDEMENT LATERAL PATELLA FEMORAL ;  Surgeon: Yvette Rack., MD;  Location: [place];  Service: Orthopedics;  Laterality: Left;  . LEFT HEART CATHETERIZATION WITH CORONARY ANGIOGRAM  N/A 10/28/2013   Procedure: LEFT HEART CATHETERIZATION WITH CORONARY ANGIOGRAM;  Surgeon: Larey Dresser, MD;  Location: [facility] CATH LAB;  Service: Cardiovascular;  Laterality: N/A;  . TOTAL KNEE ARTHROPLASTY Left 07/06/2015   Procedure: TOTAL KNEE ARTHROPLASTY;  Surgeon: Earlie Server, MD;  Location: [place];  Service: Orthopedics;  Laterality: Left;  . TUBAL LIGATION      Family History  Problem Relation Age of Onset  . Healthy Mother   . Hypertension Mother   . Stroke Mother   . Stroke Father   . Heart failure Father   . Heart disease Father        CABG  . Ovarian cancer Paternal Grandmother   . Liver cancer Paternal Grandfather   . Stroke Maternal Grandmother   . Heart failure Maternal Grandmother   . Heart failure Maternal Grandfather   . Colon cancer Neg Hx   . Stomach cancer Neg Hx   . Rectal cancer Neg Hx   . Esophageal cancer Neg Hx   . Breast cancer Neg Hx     Social History   Social History  . Marital status: Significant Other    Spouse name: [name]  . Number of children: 8  . Years of education: N/A   Occupational History  . Works full time at [employer] as [place]  . Smoking status: Never Smoker  . Smokeless tobacco: Never Used     Comment: LIVES WITH 2 SMOKERS   . Alcohol use Yes     Comment: 1 glass of wine per month  . Drug use: No  . Sexual activity: Not on file   Other Topics Concern  . Not on file   Social History Narrative   Divorced, but has fiancee.  Lives with fiancee in [place].  Moved here recently from [place], [place].     She 8 children- ages 60- 35.               The PMH, PSH, Social History, Family History, Medications, and allergies have been reviewed in [facility], and have been updated if relevant.  Review of Systems  Psychiatric/Behavioral: Positive for dysphoric mood and sleep disturbance. Negative for agitation, behavioral problems, confusion, decreased concentration, hallucinations,  self-injury and suicidal ideas. The patient is nervous/anxious. The patient is not hyperactive.   All other systems reviewed and are negative.      Objective:    BP 140/90   Pulse 97   Ht 5\' 2"  (1.575 m)   Wt 211 lb (95.7 kg)   SpO2 98%   BMI 38.59 kg/m    Physical Exam   General:  Well-developed,well-nourished,in no acute distress; alert,appropriate and cooperative throughout examination Head:  normocephalic and  atraumatic.   Eyes:  vision grossly intact, PERRL Ears:  R ear normal and L ear normal externally, TMs clear bilaterally Nose:  no external deformity.   Mouth:  good dentition.   Neck:  No deformities, masses, or tenderness noted. Lungs:  Normal respiratory effort, chest expands symmetrically. Lungs are clear to auscultation, no crackles or wheezes. Heart:  Normal rate and regular rhythm. S1 and S2 normal without gallop, murmur, click, rub or other extra sounds. Msk:  No deformity or scoliosis noted of thoracic or lumbar spine.   Extremities:  No clubbing, cyanosis, edema, or deformity noted with normal full range of motion of all joints.   Neurologic:  alert & oriented X3 and gait normal.   Skin:  Intact without suspicious lesions or rashes Psych:  Cognition and judgment appear intact. Alert and cooperative with normal attention span and concentration. No apparent delusions, illusions, hallucinations       Assessment & Plan:   Generalized anxiety disorder No Follow-up on file.

## 2017-07-28 NOTE — Telephone Encounter (Signed)
Orders entered

## 2017-07-28 NOTE — Addendum Note (Signed)
Addended by: Marchia Bond on: 07/28/2017 01:51 PM   Modules accepted: Orders

## 2017-07-28 NOTE — Telephone Encounter (Signed)
Spoke with courtney @ [facility].  She stated pt has where  she can be out of work 6 times per 30 days for sickness and 2 times every 30 days for dr appointment.  Loma Sousa stated pt has not exceded that time yet.  Loma Sousa suggested I call pt to see why fmla paperwork needed to be adjusted.  I spoke with pt  She stated she is fine with the way fmla paperwork is filled out and that she didn't need any more time.  So we are going to disregard paperwork per [place] since pt doesn't need any changes

## 2017-07-28 NOTE — Telephone Encounter (Signed)
Pt dropped off Paperwork from [facility] for completion.  She was informed of possible charge.  Paperwork given to [employer]

## 2017-07-28 NOTE — Telephone Encounter (Signed)
Patient was seen today.  Patient said Dr.Aron told patient the visit before this one that she would check patient's thyroid at her next visit.  Patient forgot to discuss it with Dr.Aron at her visit today.  She'd like an order for a thyroid panel and she'll come back to the office to have it done.

## 2017-07-28 NOTE — Patient Instructions (Signed)
Great to see you. We are increasing your zoloft (sertraline) to 50 mg daily- ok to take two of your 25 mg tablets until you run out.  I have sent in a new prescription.

## 2017-07-29 ENCOUNTER — Other Ambulatory Visit: Payer: Self-pay

## 2017-07-29 LAB — TSH: TSH: 1.4 u[IU]/mL (ref 0.450–4.500)

## 2017-07-29 LAB — T4, FREE: FREE T4: 0.95 ng/dL (ref 0.82–1.77)

## 2017-07-29 LAB — T3, FREE: T3 FREE: 3.1 pg/mL (ref 2.0–4.4)

## 2017-08-17 ENCOUNTER — Other Ambulatory Visit: Payer: Self-pay | Admitting: Family Medicine

## 2017-08-18 NOTE — Telephone Encounter (Signed)
Both rxs printed and were signed by physician. Izora Gala, cma called prior to going to lunch and verified where these medications needed to be call into. I called both medications into pt's pharmacy.  Nothing further is needed

## 2017-08-18 NOTE — Telephone Encounter (Signed)
Patient is calling about prescriptions.  Patient is out of her medication and wanted to know if it could be called in as soon as possible. Please call patient when medications is called in or if there's a problem at [phone].

## 2017-09-08 ENCOUNTER — Telehealth: Payer: Self-pay | Admitting: Family Medicine

## 2017-09-08 NOTE — Telephone Encounter (Signed)
Pt stated [facility] will refax paperwork Its going to be exactly the same as last time. Just renewing

## 2017-09-09 NOTE — Telephone Encounter (Signed)
Left message asking pt to call office Copy for file  Copy for scan Copy for pt

## 2017-09-09 NOTE — Telephone Encounter (Signed)
Form signed and in my box. 

## 2017-09-09 NOTE — Telephone Encounter (Signed)
Paperwork in [name] in box for review and signature

## 2017-09-09 NOTE — Telephone Encounter (Signed)
Paperwork faxed °

## 2017-09-12 ENCOUNTER — Other Ambulatory Visit: Payer: Self-pay | Admitting: Internal Medicine

## 2017-09-16 ENCOUNTER — Telehealth: Payer: Self-pay | Admitting: Family Medicine

## 2017-09-16 NOTE — Telephone Encounter (Signed)
FIORICET last filled on 08/18/17 #30 Diazepam last filled on 08/18/17 #60

## 2017-09-17 NOTE — Telephone Encounter (Signed)
Pt left v/m that someone called pt today at 9:30 that 2 rx was called in this morning; did fioricet with codeine and diazepam get called in or faxed: pt request cb.

## 2017-09-22 ENCOUNTER — Ambulatory Visit: Payer: Self-pay | Admitting: Internal Medicine

## 2017-09-30 ENCOUNTER — Encounter: Payer: Self-pay | Admitting: Internal Medicine

## 2017-09-30 ENCOUNTER — Ambulatory Visit (INDEPENDENT_AMBULATORY_CARE_PROVIDER_SITE_OTHER): Payer: 59 | Admitting: Internal Medicine

## 2017-09-30 VITALS — BP 126/74 | HR 93 | Ht 62.0 in | Wt 207.8 lb

## 2017-09-30 DIAGNOSIS — I5189 Other ill-defined heart diseases: Secondary | ICD-10-CM | POA: Insufficient documentation

## 2017-09-30 DIAGNOSIS — I10 Essential (primary) hypertension: Secondary | ICD-10-CM

## 2017-09-30 DIAGNOSIS — I25118 Atherosclerotic heart disease of native coronary artery with other forms of angina pectoris: Secondary | ICD-10-CM | POA: Diagnosis not present

## 2017-09-30 DIAGNOSIS — E785 Hyperlipidemia, unspecified: Secondary | ICD-10-CM

## 2017-09-30 DIAGNOSIS — I519 Heart disease, unspecified: Secondary | ICD-10-CM

## 2017-09-30 DIAGNOSIS — R0602 Shortness of breath: Secondary | ICD-10-CM | POA: Diagnosis not present

## 2017-09-30 MED ORDER — RANOLAZINE ER 500 MG PO TB12: 500.0000 mg | ORAL_TABLET | Freq: Two times a day (BID) | ORAL | 3 refills | Status: DC

## 2017-09-30 NOTE — Patient Instructions (Addendum)
Medication Instructions:  Your physician has recommended you make the following change in your medication:  1- STOP Imdur. 2- START Ranexa 500 mg (1 tablet) by mouth two times a day.   Labwork: none  Testing/Procedures: none  Follow-Up: Your physician recommends that you schedule a follow-up appointment in: 4-6 WEEKS WITH DR END OR APP.   For Ranexa Patient Assistance call [phone].  If you need a refill on your cardiac medications before your next appointment, please call your pharmacy.   Medication Samples have been provided to the patient.  Drug name: Ranexa       Strength: 500 mg        Qty: 2 boxes  LOT: [id number]  Exp.Date: 08/2019

## 2017-09-30 NOTE — Progress Notes (Signed)
Follow-up Outpatient Visit Date: 09/30/2017  Primary Care Provider: [place]ille [place] [id number]  Chief Complaint: Shortness of breath  HPI:  Diane [name] is a 66 y.o. year-old female with history of obstructive coronary artery disease, stroke, hypertension, hyperlipidemia, and GERD, who presents for follow-up of chest pain. I last saw her in June, which time she was doing well. She noted some exertional chest discomfort, particularly when outside in the heat. Overall, discomfort was less severe than what she had experienced in the past. Breathing and also improved after addition of an inhaler. Her last visit, we also checked her cholesterol, with improved LDL at 91. We agreed to increase atorvastatin 40 mg daily in hopes of achieving a goal LDL less than 70.  Today, [name] reports feeling a bit more short of breath. She also has some nausea and clamminess this morning. She notes that this time the yearis always challenging for her due to allergies and changes in the weather. Up until recently, she had been feeling relatively well with improved chest pain and shortness of breath. She remains on isosorbide mononitrate 15 mg daily but is concerned because of continued headaches. Her chest pain has been well-controlled, with only mild discomfort when doing vigorous activities. She notes occasional palpitations but no lightheadedness. She has stable 3 pillow orthopnea. She notes occasional dependent edema at the Briah Nary of the day but overall feels that her swelling and weight have been adequately controlled with furosemide. She has not spoken with her pulmonologist about a sleep study yet.  --------------------------------------------------------------------------------------------------  Cardiovascular History & Procedures: Cardiovascular Problems:  Nonobstructive coronary artery disease by catheterization in 2013  Atypical chest pain  Heart failure  Risk  Factors:  Known coronary artery disease, hypertension, hyperlipidemia, obesity, and sedentary lifestyle  Cath/PCI:  LHC (10/28/13): LMCA normal. LAD with 30% proximal and 40% mid lesions. Large LCx without significant disease. Dominant RCA with 30% proximal stenosis. LVEF 55-60%.  CV Surgery:  None  EP Procedures and Devices:  None  Non-Invasive Evaluation(s):  Pharmacologic MPI (05/19/17): Low risk study with small in size, moderate in severity, reversible defect at the apex. This most likely represents shifting breast attenuation, though small area of ischemia cannot be excluded. LVEF >65%.  TTE (05/05/17): Normal LV size and wall thickness with LVEF 65-70%. Normal wall motion. Grade 1 diastolic dysfunction. Normal RV size and function. No significant valvular abnormalities.  Carotid Doppler (02/03/13): No significant extracranial carotid artery stenosis. Patent vertebral arteries with antegrade flow.  TTE (02/02/13): Normal LV size and wall thickness with LVEF of 55-60%. Normal LV wall motion. Grade 1 diastolic dysfunction. Normal RV size and function. No significant valvular abnormalities. Mild pulmonary hypertension.  Recent CV Pertinent Labs: Lab Results  Component Value Date   CHOL 193 06/17/2017   HDL 78 06/17/2017   LDLCALC 91 06/17/2017   TRIG 118 06/17/2017   CHOLHDL 2.5 06/17/2017   CHOLHDL 4.0 10/28/2013   INR 0.96 06/25/2015   BNP 14.4 04/02/2017   K 3.5 06/17/2017   MG 2.0 10/28/2013   BUN 11 06/17/2017   CREATININE 0.74 06/17/2017    Past medical and surgical history were reviewed and updated in EPIC.  No outpatient prescriptions have been marked as taking for the 09/30/17 encounter (Appointment) with Kyshaun Barnette, Harrell Gave, MD.   Current Facility-Administered Medications for the 09/30/17 encounter (Appointment) with Milderd Manocchio, Harrell Gave, MD  Medication  . 0.9 %  sodium chloride infusion    Allergies: Patient has no  known allergies.  Social History   Social  History  . Marital status: Significant Other    Spouse name: Diane [name]  . Number of children: 8  . Years of education: N/A   Occupational History  . Works full time at [employer] as [place]  . Smoking status: Never Smoker  . Smokeless tobacco: Never Used     Comment: LIVES WITH 2 SMOKERS   . Alcohol use Yes     Comment: 1 glass of wine per month  . Drug use: No  . Sexual activity: Not on file   Other Topics Concern  . Not on file   Social History Narrative   Divorced, but has fiancee.  Lives with fiancee in [place].  Moved here recently from [place], [place].     She 8 children- ages 79- 70.                Family History  Problem Relation Age of Onset  . Healthy Mother   . Hypertension Mother   . Stroke Mother   . Stroke Father   . Heart failure Father   . Heart disease Father        CABG  . Ovarian cancer Paternal Grandmother   . Liver cancer Paternal Grandfather   . Stroke Maternal Grandmother   . Heart failure Maternal Grandmother   . Heart failure Maternal Grandfather   . Colon cancer Neg Hx   . Stomach cancer Neg Hx   . Rectal cancer Neg Hx   . Esophageal cancer Neg Hx   . Breast cancer Neg Hx     Review of Systems: A 12-system review of systems was performed and was negative except as noted in the HPI.  --------------------------------------------------------------------------------------------------  Physical Exam: BP 126/74 (BP Location: Left Arm, Patient Position: Sitting, Cuff Size: Normal)   Pulse 93   Ht 5\' 2"  (1.575 m)   Wt 207 lb 12 oz (94.2 kg)   BMI 38.00 kg/m   General:  Obese woman, seated comfortably in the exam room. HEENT: No conjunctival pallor or scleral icterus. Moist mucous membranes.  OP clear. Neck: Supple without lymphadenopathy, thyromegaly, JVD, or HJR.  Lungs: Normal work of breathing. Clear to auscultation bilaterally without wheezes or crackles. Heart: Distant heart sounds.  Regular rate and rhythm without murmurs, rubs, or gallops. Unable to assess PMI due to body habitus. Abd: Bowel sounds present. Soft, NT/ND. Unable to assess hepatosplenomegaly due to body habitus. Ext: No lower extremity edema. Radial, PT, and DP pulses are 2+ bilaterally. Skin: Warm and dry without rash.  EKG:  Normal sinus rhythm with borderline LVH and some baseline artifact. No significant change since 06/17/17.  Lab Results  Component Value Date   WBC 7.8 04/29/2017   HGB 14.7 04/29/2017   HCT 43.0 04/29/2017   MCV 95.9 04/29/2017   PLT 264.0 04/29/2017    Lab Results  Component Value Date   NA 139 06/17/2017   K 3.5 06/17/2017   CL 94 (L) 06/17/2017   CO2 22 06/17/2017   BUN 11 06/17/2017   CREATININE 0.74 06/17/2017   GLUCOSE 101 (H) 06/17/2017   ALT 35 (H) 06/17/2017    Lab Results  Component Value Date   CHOL 193 06/17/2017   HDL 78 06/17/2017   LDLCALC 91 06/17/2017   TRIG 118 06/17/2017   CHOLHDL 2.5 06/17/2017   --------------------------------------------------------------------------------------------------  ASSESSMENT AND PLAN: Shortness of breath and diastolic dysfunction Dyspnea is likely  multifactorial, including underlying obstructive lung disease and morbid obesity. Her grade 1 diastolic dysfunction may be contributing, though overall Ms. Cuddeback appears euvolemic on exam today. I have encouraged her to continue her current dose of furosemide and speak with her pulmonologist when she returns to see in about a sleep study.  Stable angina Chest pain has been well-controlled, though Ms. Tuzzolino reports frequent headaches with isosorbide mononitrate. We have discussed other options, including escalation of diltiazem, adding a beta blocker, or adding ranolazine. We have agreed to start ranolazine 500 mg twice a day. I will have Ms. Biskup return to see me in 4-6 weeks to reassess her symptoms. If she does not have any improvement in her dyspnea or if her chest  pain worsens, we will need to consider left and right heart catheterization.  Essential hypertension Blood pressure is well controlled today. No medication changes other than the aforementioned switch from isosorbide mononitrate to ranolazine, which will hopefully not impact her blood pressure significantly.  Hyperlipidemia Ms. Sica is tolerating atorvastatin 40 mg daily well. We will discuss repeat lipid panel when she returns to see [place].  Follow-up: Return to clinic in 4-6 weeks.  Nelva Bush, MD 09/30/2017 7:51 AM

## 2017-10-15 ENCOUNTER — Other Ambulatory Visit: Payer: Self-pay | Admitting: Family Medicine

## 2017-10-16 NOTE — Telephone Encounter (Signed)
Pt left v/m requesting cb on refills for fioricet and diazepam. Pt will be out of med this weekend.

## 2017-10-16 NOTE — Telephone Encounter (Signed)
Requesting: Fioricet & Diazepam Contract: None UDS: None Last OV: 7.31.2018 Next OV: Not scheduled Last Refill: 9.20.2018 & 9.20.2018   Please advise

## 2017-10-16 NOTE — Telephone Encounter (Signed)
Called in to pharm/pt aware/thx dmf

## 2017-10-28 ENCOUNTER — Ambulatory Visit: Payer: Self-pay | Admitting: Family Medicine

## 2017-10-28 ENCOUNTER — Other Ambulatory Visit: Payer: Self-pay | Admitting: Family Medicine

## 2017-11-04 ENCOUNTER — Encounter: Payer: Self-pay | Admitting: Family Medicine

## 2017-11-04 ENCOUNTER — Ambulatory Visit: Payer: 59 | Admitting: Family Medicine

## 2017-11-04 ENCOUNTER — Other Ambulatory Visit: Payer: Self-pay

## 2017-11-04 DIAGNOSIS — G4700 Insomnia, unspecified: Secondary | ICD-10-CM | POA: Insufficient documentation

## 2017-11-04 DIAGNOSIS — F339 Major depressive disorder, recurrent, unspecified: Secondary | ICD-10-CM | POA: Diagnosis not present

## 2017-11-04 DIAGNOSIS — R0789 Other chest pain: Secondary | ICD-10-CM

## 2017-11-04 MED ORDER — TRAZODONE HCL 50 MG PO TABS: 25.0000 mg | ORAL_TABLET | Freq: Every evening | ORAL | 3 refills | Status: DC | PRN

## 2017-11-04 MED ORDER — CYCLOBENZAPRINE HCL 5 MG PO TABS: 5.0000 mg | ORAL_TABLET | Freq: Three times a day (TID) | ORAL | 1 refills | Status: DC | PRN

## 2017-11-04 MED ORDER — SERTRALINE HCL 50 MG PO TABS: 50.0000 mg | ORAL_TABLET | Freq: Every day | ORAL | 0 refills | Status: DC

## 2017-11-04 NOTE — Assessment & Plan Note (Signed)
Deteriorated due to acute stressors.  She really would like to try adding a sleep aid.  Declining psychotherapy at this time. Continue current dose of zoloft. eRx sent for trazodone. Call or return to clinic prn if these symptoms worsen or fail to improve as anticipated. The patient indicates understanding of these issues and agrees with the plan.

## 2017-11-04 NOTE — Patient Instructions (Addendum)
Great to see you. Let's try flexeril 5 mg as needed at bedtime, also a heating pad.  Your exam is reassuring.  Please keep me updated.  We are starting trazodone - 1/2 to 1 full tablet nightly as needed for insomnia.
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
## 2017-11-04 NOTE — Assessment & Plan Note (Addendum)
With cervical hyperextension strain, no other direct injuries observed  Rest, apply ice prn; use extra-strength Tylenol 1-2 tabs po q4h prn; may try advil.  Flexeril as needed. Expect some increased pain for 1-3 days, then a decrease. Have asked the patient to be alert for new or progressive symptoms such as changing level of consciousness, persistent tingling or weakness in extremities or other unexplained symptoms. Return prn.

## 2017-11-11 ENCOUNTER — Encounter: Payer: Self-pay | Admitting: Internal Medicine

## 2017-11-11 ENCOUNTER — Ambulatory Visit (INDEPENDENT_AMBULATORY_CARE_PROVIDER_SITE_OTHER): Payer: 59 | Admitting: Internal Medicine

## 2017-11-11 VITALS — BP 104/68 | HR 66 | Ht 62.0 in | Wt 212.8 lb

## 2017-11-11 DIAGNOSIS — I5032 Chronic diastolic (congestive) heart failure: Secondary | ICD-10-CM | POA: Insufficient documentation

## 2017-11-11 DIAGNOSIS — I10 Essential (primary) hypertension: Secondary | ICD-10-CM

## 2017-11-11 DIAGNOSIS — I25118 Atherosclerotic heart disease of native coronary artery with other forms of angina pectoris: Secondary | ICD-10-CM | POA: Diagnosis not present

## 2017-11-11 DIAGNOSIS — Z79899 Other long term (current) drug therapy: Secondary | ICD-10-CM | POA: Diagnosis not present

## 2017-11-11 DIAGNOSIS — E785 Hyperlipidemia, unspecified: Secondary | ICD-10-CM
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
## 2017-11-11 NOTE — Patient Instructions (Signed)
Medication Instructions:  Your physician recommends that you continue on your current medications as directed. Please refer to the Current Medication list given to you today.   Labwork: Your physician recommends that you return for lab work in: LIPID/ALT AT [facility]. Need to be FASTING. Please have them fax results to [phone].   Testing/Procedures: none  Follow-Up: Your physician wants you to follow-up in: 6 MONTHS WITH DR END. You will receive a reminder letter in the mail two months in advance. If you don't receive a letter, please call our office to schedule the follow-up appointment.   If you need a refill on your cardiac medications before your next appointment, please call your pharmacy.

## 2017-11-12 ENCOUNTER — Ambulatory Visit: Payer: Self-pay | Admitting: *Deleted

## 2017-11-12 ENCOUNTER — Encounter: Payer: Self-pay | Admitting: Family Medicine

## 2017-11-12 ENCOUNTER — Ambulatory Visit (INDEPENDENT_AMBULATORY_CARE_PROVIDER_SITE_OTHER): Payer: 59 | Admitting: Family Medicine

## 2017-11-12 ENCOUNTER — Other Ambulatory Visit: Payer: Self-pay

## 2017-11-12 VITALS — BP 112/80 | HR 85 | Temp 98.1°F | Resp 18 | Ht 62.0 in | Wt 211.4 lb

## 2017-11-12 DIAGNOSIS — M545 Low back pain, unspecified: Secondary | ICD-10-CM

## 2017-11-12 LAB — LIPID PANEL
CHOLESTEROL TOTAL: 212 mg/dL — AB (ref 100–199)
Chol/HDL Ratio: 3 ratio (ref 0.0–4.4)
HDL: 70 mg/dL (ref >39)
LDL Calculated: 112 mg/dL — ABNORMAL HIGH (ref 0–99)
Triglycerides: 152 mg/dL — ABNORMAL HIGH (ref 0–149)
VLDL CHOLESTEROL CAL: 30 mg/dL (ref 5–40)

## 2017-11-12 LAB — ALT: ALT: 20 IU/L (ref 0–32)

## 2017-11-12 MED ORDER — CYCLOBENZAPRINE HCL 10 MG PO TABS: 10.0000 mg | ORAL_TABLET | Freq: Three times a day (TID) | ORAL | 0 refills | Status: DC | PRN

## 2017-11-12 MED ORDER — DICLOFENAC SODIUM 1 % TD GEL: 2.0000 g | Freq: Four times a day (QID) | TRANSDERMAL | 1 refills | Status: DC

## 2017-11-12 NOTE — Patient Instructions (Signed)
     IF you received an x-ray today, you will receive an invoice from [facility]. Please contact [facility] at [phone] with questions or concerns regarding your invoice.   IF you received labwork today, you will receive an invoice from [facility]. Please contact [facility] at [phone] with questions or concerns regarding your invoice.   Our billing staff will not be able to assist you with questions regarding bills from these companies.  You will be contacted with the lab results as soon as they are available. The fastest way to get your results is to activate your My Chart account. Instructions are located on the last page of this paperwork. If you have not heard from us regarding the results in 2 weeks, please contact this office.     

## 2017-11-12 NOTE — Telephone Encounter (Signed)
Pt   Seen   Last    Week   By  Dr  Hennie Duos      Has  Had   Some  Back pain   yest  It  Got   Much  Worse   With  Some   Weakness   Pt is  At  Work   Today  Unable  To  Get   Appointment at  [facility]  To go  To  [place]    Reason for Disposition . [1] MODERATE back pain (e.g., interferes with normal activities) AND [2] present > 3 days  Answer Assessment - Initial Assessment Questions 1. ONSET: "When did the pain begin?"        2  Days    2. LOCATION: "Where does it hurt?" (upper, mid or lower back)      Lower   3. SEVERITY: "How bad is the pain?"  (e.g., Scale 1-10; mild, moderate, or severe)   - MILD (1-3): doesn't interfere with normal activities    - MODERATE (4-7): interferes with normal activities or awakens from sleep    - SEVERE (8-10): excruciating pain, unable to do any normal activities       10 4. PATTERN: "Is the pain constant?" (e.g., yes, no; constant, intermittent)        Depends on  Position   5. RADIATION: "Does the pain shoot into your legs or elsewhere?"       no 6. CAUSE:  "What do you think is causing the back pain?"      *No Answer* 7. BACK OVERUSE:  "Any recent lifting of heavy objects, strenuous work or exercise?"       no 8. MEDICATIONS: "What have you taken so far for the pain?" (e.g., nothing, acetaminophen, NSAIDS)      Flexeril    advil    9. NEUROLOGIC SYMPTOMS: "Do you have any weakness, numbness, or problems with bowel/bladder control?"      Weakness  Started  yest   Got  Weak while   Walking  Yesterday   10. OTHER SYMPTOMS: "Do you have any other symptoms?" (e.g., fever, abdominal pain, burning with urination, blood in urine)       no 11. PREGNANCY: "Is there any chance you are pregnant?" (e.g., yes, no; LMP)       menapause  Protocols used: BACK PAIN-A-AH
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
## 2017-11-16 ENCOUNTER — Telehealth: Payer: Self-pay | Admitting: *Deleted

## 2017-11-16 DIAGNOSIS — I5032 Chronic diastolic (congestive) heart failure: Secondary | ICD-10-CM

## 2017-11-16 DIAGNOSIS — E785 Hyperlipidemia, unspecified: Secondary | ICD-10-CM

## 2017-11-16 DIAGNOSIS — I10 Essential (primary) hypertension: Secondary | ICD-10-CM

## 2017-11-16 DIAGNOSIS — Z79899 Other long term (current) drug therapy: Secondary | ICD-10-CM

## 2017-11-16 MED ORDER — ATORVASTATIN CALCIUM 80 MG PO TABS: 80.0000 mg | ORAL_TABLET | Freq: Every day | ORAL | 3 refills | Status: DC

## 2017-11-16 NOTE — Telephone Encounter (Signed)
Results called to pt. Pt verbalized understanding of results and plan of care. Rx sent to pharmacy. Patient aware to call our office for appointment for lab draw sometime in February.

## 2017-11-16 NOTE — Telephone Encounter (Signed)
-----   Message from Nelva Bush, MD sent at 11/13/2017  1:37 PM EST ----- Please let Diane [name] know that her LDL has risen over the last 4 months. I recommend that we increase atorvastatin to 80 mg daily to help prevent progression of her nonobstructive CAD. Her liver function is normal. We should repeat a lopid panel and ALT in ~3 months to ensure adequate response. I encourage her to work on lifestyle modifications as well.

## 2017-11-18 ENCOUNTER — Other Ambulatory Visit: Payer: Self-pay | Admitting: Family Medicine

## 2017-11-18 ENCOUNTER — Ambulatory Visit: Payer: Self-pay | Admitting: *Deleted

## 2017-11-18 MED ORDER — BUTALBITAL-APAP-CAFF-COD 50-325-40-30 MG PO CAPS: ORAL_CAPSULE | ORAL | 0 refills | Status: DC

## 2017-11-18 NOTE — Telephone Encounter (Signed)
Okay to refill? 

## 2017-11-18 NOTE — Telephone Encounter (Signed)
Copied from [place]. Topic: Quick Communication - See Telephone Encounter >> Nov 18, 2017  4:44 PM Conception Chancy, NT wrote: CRM for notification. See Telephone encounter for:  11/18/17.   Pt states when she called earlier the nurse sent her to the dentist and none of the dentist could see her today they were all closed for the holiday. Pt states she would like a Refill of her migraine medicine to last her until monday when she can get to the dentist. Please let patient know if this medication is sent over.

## 2017-11-18 NOTE — Telephone Encounter (Signed)
Yes okay to refill fioricet as entered below.

## 2017-11-18 NOTE — Telephone Encounter (Signed)
Called in c/o having a metallic taste in her mouth in right lower sided jaw pain.   It really hurts when she drinks anything hot or cold.   It also hurts to eat on her right side.  I'm chewing on my left side.  "I have feelings in every tooth on that side"   I instructed her to call her dentist right away.  If she cannot  Get in touch with her dentist or the dentist could not help her to please call us back.   She acknowledged understanding of the instructions and is going to call her dentist right now. Reason for Disposition . [1] Face is swollen AND [2] no fever  Answer Assessment - Initial Assessment Questions 1. LOCATION: "Which tooth is hurting?"  (e.g., right-side/left-side, upper/lower, front/back)     Right lower jaw.   Hot and cold hurts.  I have a metallic taste in my mouith 2. ONSET: "When did the toothache start?"  (e.g., hours, days)      2 days ago.  Jaw pain today.    When I swallow I can feel it and feels swollen and tender on the right side of my neck. 3. SEVERITY: "How bad is the toothache?"  (Scale 1-10; mild, moderate or severe)   - MILD (1-3): doesn't interfere with chewing    - MODERATE (4-7): interferes with chewing, interferes with normal activities, awakens from sleep     - SEVERE (8-10): unable to eat, unable to do any normal activities, excruciating pain        4.   Eating on my right side makes it worse 4. SWELLING: "Is there any visible swelling of your face?"     No.  Just tender to the touch that started today. 5. OTHER SYMPTOMS: "Do you have any other symptoms?" (e.g., fever)     Metallic taste   No fever.    Never had the metallic taste 6. PREGNANCY: "Is there any chance you are pregnant?" "When was your last menstrual period?"     N/A  Protocols used: TOOTHACHE-A-AH

## 2017-11-24 ENCOUNTER — Other Ambulatory Visit: Payer: Self-pay | Admitting: Family Medicine

## 2017-11-24 NOTE — Telephone Encounter (Signed)
On 11.7.18 #90 was faxed to [facility]/thx dmf

## 2017-12-03 ENCOUNTER — Other Ambulatory Visit: Payer: Self-pay | Admitting: Family Medicine

## 2017-12-03 NOTE — Telephone Encounter (Signed)
Copied from [place]. Topic: Quick Communication - See Telephone Encounter >> Dec 03, 2017  2:05 PM Ether Griffins B wrote: CRM for notification. See Telephone encounter for:  Pt has contacted pharmacy about refills and wants to insure that Dr. Deborra Medina does get those since she has moved locations. She is needing refills for diazepam and butalbital-acetaminophen-caffeine  12/03/17.

## 2017-12-07 NOTE — Telephone Encounter (Signed)
Pt called back to check on refills.   [place]

## 2017-12-09 ENCOUNTER — Other Ambulatory Visit: Payer: Self-pay

## 2017-12-09 ENCOUNTER — Telehealth: Payer: Self-pay | Admitting: Family Medicine

## 2017-12-09 MED ORDER — NITROGLYCERIN 0.4 MG SL SUBL: SUBLINGUAL_TABLET | SUBLINGUAL | 0 refills | Status: DC

## 2017-12-09 NOTE — Telephone Encounter (Signed)
IS-It looks like you ar her new PCP/plz see refill req/thx dmf

## 2017-12-09 NOTE — Telephone Encounter (Signed)
Refill sent for Nitrostat 0.4 mg

## 2017-12-09 NOTE — Telephone Encounter (Signed)
Pt asking for refill of Diazepam and Butalbital-acetaminophen-caffeine.

## 2017-12-09 NOTE — Telephone Encounter (Addendum)
Relation to pt: self  Call back number: [phone] Pharmacy: [facility], [place] [phone]) [phone] (Fax)     Reason for call:  Patient checking on the status of medication refill, informed patient practice was closed Monday and Tuesday and delayed opening today, patient voice understanding and would like a follow up call regarding Rx refill, please advise

## 2017-12-09 NOTE — Telephone Encounter (Signed)
Sorry I am not her PCP, she has never established with me. She saw me for an acute issue when she could not get in with her PCP, as our clinic tends to always have availability. Please forward to her PCP accordingly. Thanks

## 2017-12-09 NOTE — Telephone Encounter (Unsigned)
Copied from [place]. Topic: Quick Communication - See Telephone Encounter >> Dec 03, 2017  2:05 PM Ether Griffins B wrote: CRM for notification. See Telephone encounter for:  Pt has contacted pharmacy about refills and wants to insure that Dr. Deborra Medina does get those since she has moved locations. She is needing refills for diazepam and butalbital-acetaminophen-caffeine  12/03/17. >> Dec 04, 2017 12:31 PM Sandi Mariscal E, NT wrote: Pt. Called back about getting her medications refilled. Pt would like a call back >> Dec 04, 2017  3:47 PM Sandi Mariscal E, NT wrote: Pt. Called back about refill. Pt would like a call back or prescription refilled to the pharmacy. >> Dec 09, 2017  4:36 PM Neva Seat wrote: Pt has called numerous times  Pt is out of all medications: diazepam and butalbital-acetaminophen-caffeine   [place] [facility] [place]  Please call pt back for status.

## 2017-12-10 NOTE — Telephone Encounter (Signed)
Completed/TA signed/faxed to pharm/thx dmf

## 2017-12-11 NOTE — Telephone Encounter (Signed)
I spoke with this patient about 5 minutes ago and advised that I called the pharmacy and left the Rx's on the voicemail and that she has to wait for them to pull them off and process them/thx dmf

## 2017-12-11 NOTE — Telephone Encounter (Signed)
Pharmacy has yet to receive them, please call patient once sent [phone]

## 2017-12-11 NOTE — Telephone Encounter (Signed)
Patient states someone just called her and would like a call back. [facility] still does not have her medications.

## 2017-12-28 ENCOUNTER — Other Ambulatory Visit: Payer: Self-pay

## 2017-12-28 MED ORDER — TRAZODONE HCL 50 MG PO TABS: 25.0000 mg | ORAL_TABLET | Freq: Every evening | ORAL | 1 refills | Status: DC | PRN

## 2017-12-31 ENCOUNTER — Ambulatory Visit (INDEPENDENT_AMBULATORY_CARE_PROVIDER_SITE_OTHER): Payer: 59 | Admitting: Family Medicine

## 2017-12-31 ENCOUNTER — Encounter: Payer: Self-pay | Admitting: Family Medicine

## 2017-12-31 VITALS — BP 130/82 | HR 90 | Temp 98.2°F | Ht 62.0 in | Wt 207.4 lb

## 2017-12-31 DIAGNOSIS — I10 Essential (primary) hypertension: Secondary | ICD-10-CM

## 2017-12-31 DIAGNOSIS — F411 Generalized anxiety disorder: Secondary | ICD-10-CM

## 2017-12-31 DIAGNOSIS — I25118 Atherosclerotic heart disease of native coronary artery with other forms of angina pectoris: Secondary | ICD-10-CM | POA: Diagnosis not present

## 2017-12-31 DIAGNOSIS — E785 Hyperlipidemia, unspecified: Secondary | ICD-10-CM | POA: Diagnosis not present

## 2017-12-31 DIAGNOSIS — Z23 Encounter for immunization: Secondary | ICD-10-CM

## 2017-12-31 DIAGNOSIS — F339 Major depressive disorder, recurrent, unspecified: Secondary | ICD-10-CM | POA: Diagnosis not present

## 2017-12-31 DIAGNOSIS — Z8673 Personal history of transient ischemic attack (TIA), and cerebral infarction without residual deficits: Secondary | ICD-10-CM | POA: Diagnosis not present

## 2017-12-31 NOTE — Assessment & Plan Note (Signed)
Well controlled. Not due for labs. Continue current dose of lipitor.  No changes made to rxs today.

## 2017-12-31 NOTE — Assessment & Plan Note (Signed)
Well controlled. Also followed by cardiology. No changes made to rxs.

## 2017-12-31 NOTE — Assessment & Plan Note (Signed)
Improved with current rxs. No changes made.

## 2017-12-31 NOTE — Assessment & Plan Note (Signed)
Continue Statin, ASA.
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
## 2018-01-27 ENCOUNTER — Ambulatory Visit (INDEPENDENT_AMBULATORY_CARE_PROVIDER_SITE_OTHER): Payer: Medicare HMO | Admitting: Family Medicine

## 2018-01-27 ENCOUNTER — Encounter: Payer: Self-pay | Admitting: Family Medicine

## 2018-01-27 VITALS — BP 120/74 | HR 86 | Temp 97.9°F | Ht 62.0 in | Wt 210.8 lb

## 2018-01-27 DIAGNOSIS — J069 Acute upper respiratory infection, unspecified: Secondary | ICD-10-CM | POA: Diagnosis not present

## 2018-01-27 MED ORDER — DOXYCYCLINE HYCLATE 100 MG PO TABS: 100.0000 mg | ORAL_TABLET | Freq: Two times a day (BID) | ORAL | 0 refills | Status: DC

## 2018-01-27 MED ORDER — HYDROCODONE-HOMATROPINE 5-1.5 MG/5ML PO SYRP: 5.0000 mL | ORAL_SOLUTION | Freq: Three times a day (TID) | ORAL | 0 refills | Status: DC | PRN

## 2018-01-27 NOTE — Progress Notes (Signed)
SUBJECTIVE:  Diane [name] is a 67 y.o. female who complains of coryza, congestion, bilateral sinus pain and itching in eyes for 3 days. Spiked a temp to 101/2 this morning.  Took Ibuprofen.  She denies a history of anorexia and chest pain and admits to a history of asthma. Patient denies smoke cigarettes.   Cough is non productive. Used her rescue inhaler twice today.  Also taking Mucinex DM, Rotitussin OTC.  Ribs hurt from coughing.  Current Outpatient Medications on File Prior to Visit  Medication Sig Dispense Refill  . aspirin EC 81 MG tablet Take 1 tablet (81 mg total) by mouth daily. 90 tablet 3  . atorvastatin (LIPITOR) 80 MG tablet Take 1 tablet (80 mg total) daily by mouth. 90 tablet 3  . benazepril-hydrochlorthiazide (LOTENSIN HCT) 20-25 MG tablet TAKE 1 TABLET BY MOUTH  EVERY DAY 90 tablet 0  . budesonide-formoterol (SYMBICORT) 80-4.5 MCG/ACT inhaler Inhale 2 puffs into the lungs 2 (two) times daily. 1 Inhaler 12  . butalbital-acetaminophen-caffeine (FIORICET WITH CODEINE) 50-325-40-30 MG capsule TAKE 1 TO 2 CAPSULES BY MOUTH EVERY DAY AS NEEDED FOR HEADACHE 30 capsule 1  . cholecalciferol (VITAMIN D) 1000 UNITS tablet Take 1,000 Units by mouth daily.    . cyclobenzaprine (FLEXERIL) 10 MG tablet Take 1 tablet (10 mg total) 3 (three) times daily as needed by mouth for muscle spasms. 30 tablet 0  . diazepam (VALIUM) 5 MG tablet TAKE 1/2 TO 1 TABLET BY MOUTH EVERY 12 HOURS AS NEEDED 60 tablet 1  . diclofenac sodium (VOLTAREN) 1 % GEL Apply 2 g 4 (four) times daily topically. 100 g 1  . furosemide (LASIX) 20 MG tablet Take 1 tablet (20 mg total) by mouth daily. 90 tablet 3  . nitroGLYCERIN (NITROSTAT) 0.4 MG SL tablet DISSOLVE 1 TABLET UNDER THE TONGUE EVERY 5 MINUTES AS  NEEDED FOR CHEST PAIN NOT  TO EXCEED 3 TABLETS IN 15  MINUTES 25 tablet 0  . PROAIR HFA 108 (90 Base) MCG/ACT inhaler INHALE 1 TO 2 PUFFS INTO THE LUNGS EVERY 6 HOURS AS NEEDED FOR WHEEZING OR SHORTNESS OF BREATH  8.5 g 1  . ranolazine (RANEXA) 500 MG 12 hr tablet Take 1 tablet (500 mg total) by mouth 2 (two) times daily. 180 tablet 3  . sertraline (ZOLOFT) 50 MG tablet Take 1 tablet (50 mg total) daily by mouth. 90 tablet 0  . traZODone (DESYREL) 50 MG tablet Take 0.5-1 tablets (25-50 mg total) by mouth at bedtime as needed for sleep. 90 tablet 1  . valACYclovir (VALTREX) 1000 MG tablet TAKE 1 TABLET BY MOUTH TWICE DAILY 10 tablet 0  . diltiazem (CARDIZEM CD) 180 MG 24 hr capsule Take 1 capsule (180 mg total) by mouth daily. 90 capsule 3   No current facility-administered medications on file prior to visit.     No Known Allergies  Past Medical History:  Diagnosis Date  . Arthritis   . CAD (coronary artery disease)   . 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 Depression   . Ejection fraction   . GERD (gastroesophageal reflux disease)   . Hyperlipidemia   . Hypertension   . PONV (postoperative nausea and vomiting)   . Skin cancer of face 05/2015   basal cell  . Stroke [facility]) 2006   deaf in right ear    Past Surgical History:  Procedure Laterality Date  .  BREAST BIOPSY Left 1984   abcess  . CESAREAN SECTION    . CHOLECYSTECTOMY  1990  . CHONDROPLASTY Left 01/05/2015   Procedure: CHONDROPLASTY;  Surgeon: Yvette Rack., MD;  Location: [place];  Service: Orthopedics;  Laterality: Left;  . KNEE ARTHROSCOPY WITH MEDIAL MENISECTOMY Left 01/05/2015   Procedure: LEFT KNEE ARTHROSCOPY WITH MEDIAL AND LATERAL MENISECTOMY; DEBRIDEMENT LATERAL PATELLA FEMORAL ;  Surgeon: Yvette Rack., MD;  Location: [place];  Service: Orthopedics;  Laterality: Left;  . LEFT HEART CATHETERIZATION WITH CORONARY ANGIOGRAM N/A 10/28/2013   Procedure: LEFT HEART CATHETERIZATION WITH CORONARY ANGIOGRAM;  Surgeon: Larey Dresser, MD;  Location: [facility] CATH LAB;  Service: Cardiovascular;  Laterality: N/A;  . TOTAL  KNEE ARTHROPLASTY Left 07/06/2015   Procedure: TOTAL KNEE ARTHROPLASTY;  Surgeon: Earlie Server, MD;  Location: [place];  Service: Orthopedics;  Laterality: Left;  . TUBAL LIGATION      Family History  Problem Relation Age of Onset  . Healthy Mother   . Hypertension Mother   . Stroke Mother   . Stroke Father   . Heart failure Father   . Heart disease Father        CABG  . Ovarian cancer Paternal Grandmother   . Liver cancer Paternal Grandfather   . Stroke Maternal Grandmother   . Heart failure Maternal Grandmother   . Heart failure Maternal Grandfather   . Colon cancer Neg Hx   . Stomach cancer Neg Hx   . Rectal cancer Neg Hx   . Esophageal cancer Neg Hx   . Breast cancer Neg Hx     Social History   Socioeconomic History  . Marital status: Significant Other    Spouse name: [name]  . Number of children: 8  . Years of education: Not on file  . Highest education level: Not on file  Social Needs  . Financial resource strain: Not on file  . Food insecurity - worry: Not on file  . Food insecurity - inability: Not on file  . Transportation needs - medical: Not on file  . Transportation needs - non-medical: Not on file  Occupational History  . 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 Smoking status: Never Smoker  . Smokeless tobacco: Never Used  . Tobacco comment: LIVES WITH 2 SMOKERS   Substance and Sexual Activity  . Alcohol use: Yes    Comment: 1 glass of wine per month  . Drug use: No  . Sexual activity: Not on file  Other Topics Concern  . Not on file  Social History Narrative   Divorced, but has fiancee.  Lives with fiancee in [place].  Moved here recently from [place], [place].     She 8 children- ages 19- 19.               The PMH, PSH, Social History, Family History, Medications, and allergies have been reviewed in [facility], and have been updated if relevant.  OBJECTIVE: BP 120/74 (BP Location: Left Arm,  Patient Position: Sitting, Cuff Size: Normal)   Pulse 86   Temp 97.9 F (36.6 C) (Oral)   Ht 5\' 2"  (1.575 m)   Wt 210 lb 12.8 oz (95.6 kg)   SpO2 97%   BMI 38.56 kg/m   She appears well, vital signs are as noted. Ears normal.  Throat and pharynx normal.  Neck supple. No adenopathy in the  neck. Nose is congested. Sinuses non tender. Rales RLL, scattered wheezes  ASSESSMENT:  bronchitis  PLAN: Doxycyline 100 mg twice daily x 7 days, hycodan as needed for severe cough.  Symptomatic therapy suggested: push fluids, rest and return office visit prn if symptoms persist or worsen Call or return to clinic prn if these symptoms worsen or fail to improve as anticipated.

## 2018-01-27 NOTE — Patient Instructions (Signed)
Great to see you.  Please take doxycyline 100 mg twice daily x 7 days.  Hycodan as needed for severe cough.

## 2018-01-28 ENCOUNTER — Other Ambulatory Visit: Payer: Self-pay | Admitting: Family Medicine

## 2018-01-28 MED ORDER — SERTRALINE HCL 50 MG PO TABS: 50.0000 mg | ORAL_TABLET | Freq: Every day | ORAL | 1 refills | Status: DC

## 2018-01-28 NOTE — Telephone Encounter (Signed)
Copied from [place] [id number]. Topic: Quick Communication - Rx Refill/Question >> Jan 28, 2018  7:53 AM Synthia Innocent wrote: Medication: sertraline (ZOLOFT) 50 MG tablet    Has the patient contacted their pharmacy?No, new pharmacy   (Agent: If no, request that the patient contact the pharmacy for the refill.)   Preferred Pharmacy (with phone number or street name): [facility]   Agent: Please be advised that RX refills may take up to 3 business days. We ask that you follow-up with your pharmacy.

## 2018-01-28 NOTE — Telephone Encounter (Signed)
Rx sent in

## 2018-01-29 ENCOUNTER — Other Ambulatory Visit: Payer: Self-pay | Admitting: Family Medicine

## 2018-01-29 ENCOUNTER — Other Ambulatory Visit: Payer: Self-pay | Admitting: *Deleted

## 2018-01-29 ENCOUNTER — Telehealth: Payer: Self-pay

## 2018-01-29 MED ORDER — BENAZEPRIL-HYDROCHLOROTHIAZIDE 20-25 MG PO TABS: 1.0000 | ORAL_TABLET | Freq: Every day | ORAL | 0 refills | Status: DC

## 2018-01-29 NOTE — Telephone Encounter (Signed)
PA for Fioricet with Codeine initiated via [facility]/thx dmf

## 2018-01-29 NOTE — Telephone Encounter (Signed)
Copied from [place] [phone]. Topic: Quick Communication - Rx Refill/Question >> Jan 29, 2018  8:20 AM Margot Ables wrote: Medication: benazepril-hydrochlorthiazide (LOTENSIN HCT) 20-25 MG tablet - pt was using mail order but changed to [facility] and mail order will not fill - pt was advised needing a new RX sent to updated pharmacy - she has 3 pills left Has the patient contacted their pharmacy? Yes.   Preferred Pharmacy (with phone number or street name): [place], [address] [phone] (Phone) [phone] (Fax)     Agent: Please be advised that RX refills may take up to 3 business days. We ask that you follow-up with your pharmacy.

## 2018-02-01 NOTE — Telephone Encounter (Signed)
PA approved until 01/29/2019 per [employer]/thx dmf

## 2018-02-08 ENCOUNTER — Telehealth: Payer: Self-pay | Admitting: Family Medicine

## 2018-02-08 NOTE — Telephone Encounter (Signed)
Copied from [place] [id number]. Topic: Quick Communication - Rx Refill/Question >> Feb 08, 2018  3:36 PM Percell Belt A wrote: Medication:  butalbital-acetaminophen-caffeine (FIORICET WITH CODEINE) 50-325-40-30 MG capsule [[id number]]   Has the patient contacted their pharmacy? No    (Agent: If no, request that the patient contact the pharmacy for the refill.)   Preferred Pharmacy (with phone number or street name) [place]: Please be advised that RX refills may take up to 3 business days. We ask that you follow-up with your pharmacy.

## 2018-02-08 NOTE — Telephone Encounter (Signed)
Refill of Fioricet  LOV 12/31/17   with Dr. Deborra Medina  [facility] 12/10/17   #30   1 refill   [place] [phone]

## 2018-02-09 MED ORDER — BUTALBITAL-APAP-CAFF-COD 50-325-40-30 MG PO CAPS: ORAL_CAPSULE | ORAL | 1 refills | Status: DC

## 2018-02-09 NOTE — Telephone Encounter (Signed)
Printed for TA to sign/will fax when signed/thx dmf

## 2018-02-10 ENCOUNTER — Other Ambulatory Visit: Payer: Self-pay | Admitting: Family Medicine

## 2018-02-11 NOTE — Telephone Encounter (Signed)
Rx resend to [facility] as pt request.

## 2018-02-11 NOTE — Telephone Encounter (Signed)
Left a vm for the pt to call back, need to know which pharmacy she wants [place] to send to because we sent in 6 mo supply to [facility] 12/28/2017.

## 2018-02-22 ENCOUNTER — Other Ambulatory Visit: Payer: Self-pay | Admitting: Family Medicine

## 2018-02-24 ENCOUNTER — Telehealth: Payer: Self-pay | Admitting: Family Medicine

## 2018-02-24 NOTE — Telephone Encounter (Signed)
Rx last filled 01.18.19, okay to refill?

## 2018-02-24 NOTE — Telephone Encounter (Signed)
Copied from [place]. Topic: Quick Communication - Rx Refill/Question >> Feb 24, 2018 10:42 AM Neva Seat wrote: Diazepam 5 mg 2 times a day   Pt is out of Rx needing a refill asap.   [place], [place] [address] [place] [address] Phone: [phone] Fax: [phone]

## 2018-02-25 ENCOUNTER — Other Ambulatory Visit: Payer: Self-pay | Admitting: Family Medicine

## 2018-02-25 NOTE — Telephone Encounter (Signed)
Left message for pt. That Valium was refilled 02/24/18, sent to [facility].

## 2018-02-25 NOTE — Telephone Encounter (Signed)
Rx phoned in earlier this morning.

## 2018-03-08 ENCOUNTER — Other Ambulatory Visit: Payer: Self-pay

## 2018-03-08 MED ORDER — DILTIAZEM HCL ER COATED BEADS 180 MG PO CP24: 180.0000 mg | ORAL_CAPSULE | Freq: Every day | ORAL | 3 refills | Status: DC

## 2018-03-08 MED ORDER — FUROSEMIDE 20 MG PO TABS: 20.0000 mg | ORAL_TABLET | Freq: Every day | ORAL | 3 refills | Status: DC

## 2018-03-29 ENCOUNTER — Other Ambulatory Visit: Payer: Self-pay

## 2018-03-29 ENCOUNTER — Other Ambulatory Visit: Payer: Self-pay | Admitting: Family Medicine

## 2018-03-29 MED ORDER — DIAZEPAM 5 MG PO TABS: ORAL_TABLET | ORAL | 2 refills | Status: DC

## 2018-03-29 NOTE — Telephone Encounter (Signed)
LOV:01/27/18  Dr. Sanjuana Letters    [facility]

## 2018-03-29 NOTE — Telephone Encounter (Signed)
Copied from [place] [id number]. Topic: Quick Communication - Rx Refill/Question >> Mar 29, 2018 10:27 AM Carolyn Stare wrote: Medication diazepam (VALIUM) 5 MG tablet  Has the patient contacted their pharmacy yes    Preferred Pharmacy  [place]: Please be advised that RX refills may take up to 3 business days. We ask that you follow-up with your pharmacy.

## 2018-03-29 NOTE — Progress Notes (Signed)
Printed/will fax to [facility] per pt req after TA approves and signs/thx dmf

## 2018-04-12 ENCOUNTER — Other Ambulatory Visit: Payer: Self-pay | Admitting: Family Medicine

## 2018-04-13 NOTE — Telephone Encounter (Signed)
Copied from [place] [id number]. Topic: Quick Communication - Rx Refill/Question >> Apr 13, 2018  4:08 PM Aurelio Brash B wrote: Medication: butalbital-acetaminophen-caffeine (FIORICET WITH CODEINE) 50-325-40-30 MG capsule   Has the patient contacted their pharmacy? Yes   (Agent: If no, request that the patient contact the pharmacy for the refill.)  Preferred Pharmacy (with phone number or street name): [place], [place] [address] [place] [address] [phone] (Phone) [phone] (Fax)       Agent: Please be advised that RX refills may take up to 3 business days. We ask that you follow-up with your pharmacy.   Pt called to check on status if refill

## 2018-04-14 MED ORDER — BUTALBITAL-APAP-CAFF-COD 50-325-40-30 MG PO CAPS: ORAL_CAPSULE | ORAL | 1 refills | Status: DC

## 2018-04-14 NOTE — Addendum Note (Signed)
Addended by: Kateri Mc E on: 04/14/2018 08:19 AM   Modules accepted: Orders

## 2018-04-19 ENCOUNTER — Telehealth: Payer: Self-pay | Admitting: Internal Medicine

## 2018-04-19 ENCOUNTER — Other Ambulatory Visit: Payer: Self-pay

## 2018-04-19 MED ORDER — RANOLAZINE ER 500 MG PO TB12: 500.0000 mg | ORAL_TABLET | Freq: Two times a day (BID) | ORAL | 0 refills | Status: DC

## 2018-04-19 NOTE — Telephone Encounter (Signed)
°*  STAT* If patient is at the pharmacy, call can be transferred to refill team.   1. Which medications need to be refilled? (please list name of each medication and dose if known) RANEXA 500 MG 1 tablet 2 times daily   2. Which pharmacy/location (including [place] and city if local pharmacy) is medication to be sent to? [facility] on [address] and [address]   3. Do they need a 30 day or 90 day supply? 90 day  Patient has retired recently and will no longer use [facility]

## 2018-04-19 NOTE — Telephone Encounter (Signed)
Refill sent.

## 2018-04-21 ENCOUNTER — Other Ambulatory Visit: Payer: Self-pay | Admitting: *Deleted

## 2018-04-21 ENCOUNTER — Telehealth: Payer: Self-pay | Admitting: Internal Medicine

## 2018-04-21 MED ORDER — ATORVASTATIN CALCIUM 80 MG PO TABS: 80.0000 mg | ORAL_TABLET | Freq: Every day | ORAL | 0 refills | Status: DC

## 2018-04-21 NOTE — Telephone Encounter (Signed)
Requested Prescriptions   Signed Prescriptions Disp Refills   atorvastatin (LIPITOR) 80 MG tablet 90 tablet 0    Sig: Take 1 tablet (80 mg total) by mouth daily.    Authorizing Provider: END, CHRISTOPHER    Ordering User: Dontre Laduca C    

## 2018-04-21 NOTE — Telephone Encounter (Signed)
  °*  STAT* If patient is at the pharmacy, call can be transferred to refill team.   1. Which medications need to be refilled? (please list name of each medication and dose if known) generic for Lipitor   2. Which pharmacy/location (including [place] and city if local pharmacy) is medication to be sent to? [facility] on [address] and [address]   3. Do they need a 30 day or 90 day supply? 90 day  Patient has retired recently and will no longer use [employer]

## 2018-04-21 NOTE — Telephone Encounter (Signed)
Requested Prescriptions   Signed Prescriptions Disp Refills   atorvastatin (LIPITOR) 80 MG tablet 90 tablet 0    Sig: Take 1 tablet (80 mg total) by mouth daily.    Authorizing Provider: END, CHRISTOPHER    Ordering User: LOPEZ, MARINA C    

## 2018-04-28 ENCOUNTER — Other Ambulatory Visit: Payer: Self-pay | Admitting: Family Medicine

## 2018-05-04 ENCOUNTER — Ambulatory Visit (INDEPENDENT_AMBULATORY_CARE_PROVIDER_SITE_OTHER): Payer: Medicare HMO | Admitting: Family Medicine

## 2018-05-04 ENCOUNTER — Encounter: Payer: Self-pay | Admitting: Family Medicine

## 2018-05-04 VITALS — BP 114/80 | HR 75 | Temp 98.7°F | Ht 62.0 in | Wt 204.4 lb

## 2018-05-04 DIAGNOSIS — F339 Major depressive disorder, recurrent, unspecified: Secondary | ICD-10-CM

## 2018-05-04 DIAGNOSIS — F4310 Post-traumatic stress disorder, unspecified: Secondary | ICD-10-CM | POA: Insufficient documentation

## 2018-05-04 MED ORDER — DIAZEPAM 10 MG PO TABS: ORAL_TABLET | ORAL | 0 refills | Status: DC

## 2018-05-04 MED ORDER — SERTRALINE HCL 100 MG PO TABS: 100.0000 mg | ORAL_TABLET | Freq: Every day | ORAL | 3 refills | Status: DC

## 2018-05-04 NOTE — Patient Instructions (Addendum)
Great to see you. We are increasing your zoloft to 100 mg daily.  We are changing your valium to 5 mg three times daily as needed.  Please stop by to set up an appointment with Caroline Sauger for therapy.

## 2018-05-04 NOTE — Assessment & Plan Note (Signed)
Recent stressor with her son's illness has caused worsening of her symptoms. >25 minutes spent in face to face time with patient, >50% spent in counselling or coordination of care Discussed tx options- we agreed to refer to [facility] for psychotherapy, increase zoloft to 100 mg daily and increase valium to be taken (in the short term) as 5 mg up to three times daily as needed.  She will use this only when she has a severe panic attack. Follow up in 1 month. The patient indicates understanding of these issues and agrees with the plan.

## 2018-05-04 NOTE — Progress Notes (Signed)
Subjective:   Patient ID: Diane [name], female    DOB: 1951/03/15, 67 y.o.   MRN: [id number]  Diane [name] is a pleasant 67 y.o. year old female who presents to clinic today with Depression (Patient is here today to discuss depression.  She currently takes Sertraline 50mg  1qd. She feels like the current regimen is not working like it used to.)  on 05/04/2018  HPI:  Here for worsening anxiety and depression- Currently taking zoloft 50 mg daily and trazodone 50 mg nightly with Valium 5 mg twice daily as needed for anxiety. This was working well until a few weeks ago when her son was hospitalized in the ICU, on a vent with flu and post flu PNA.  He was in the [employer] and has PTSD and did not tolerate being on a vent well.  Lelon Frohlich would not leave her side and it triggered her own PTSD from when her own 18 month old daughter died after being on a vent with epiglottis.  Her son is better and home yet she still feels constantly shaky and hypervigilant. Not sleeping well. No SI or HI. Current Outpatient Medications on File Prior to Visit  Medication Sig Dispense Refill  . aspirin EC 81 MG tablet Take 1 tablet (81 mg total) by mouth daily. 90 tablet 3  . atorvastatin (LIPITOR) 80 MG tablet Take 1 tablet (80 mg total) by mouth daily. 90 tablet 0  . benazepril-hydrochlorthiazide (LOTENSIN HCT) 20-25 MG tablet Take 1 tablet by mouth daily. 90 tablet 0  . budesonide-formoterol (SYMBICORT) 80-4.5 MCG/ACT inhaler Inhale 2 puffs into the lungs 2 (two) times daily. 1 Inhaler 12  . butalbital-acetaminophen-caffeine (FIORICET WITH CODEINE) 50-325-40-30 MG capsule TAKE 1 TO 2 CAPSULES BY MOUTH DAILY AS NEEDED FOR HEADACHE 30 capsule 1  . cholecalciferol (VITAMIN D) 1000 UNITS tablet Take 1,000 Units by mouth daily.    . cyclobenzaprine (FLEXERIL) 10 MG tablet Take 1 tablet (10 mg total) 3 (three) times daily as needed by mouth for muscle spasms. 30 tablet 0  . diazepam (VALIUM) 5 MG tablet TAKE  ONE-HALF TO 1 TABLET BY MOUTH TWICE DAILY AS NEEDED 60 tablet 2  . diclofenac sodium (VOLTAREN) 1 % GEL Apply 2 g 4 (four) times daily topically. 100 g 1  . diltiazem (CARDIZEM CD) 180 MG 24 hr capsule Take 1 capsule (180 mg total) by mouth daily. 90 capsule 3  . doxycycline (VIBRA-TABS) 100 MG tablet Take 1 tablet (100 mg total) by mouth 2 (two) times daily. 14 tablet 0  . furosemide (LASIX) 20 MG tablet Take 1 tablet (20 mg total) by mouth daily. 90 tablet 3  . HYDROcodone-homatropine (HYCODAN) 5-1.5 MG/5ML syrup Take 5 mLs by mouth every 8 (eight) hours as needed for cough. 120 mL 0  . nitroGLYCERIN (NITROSTAT) 0.4 MG SL tablet DISSOLVE 1 TABLET UNDER THE TONGUE EVERY 5 MINUTES AS  NEEDED FOR CHEST PAIN NOT  TO EXCEED 3 TABLETS IN 15  MINUTES 25 tablet 0  . PROAIR HFA 108 (90 Base) MCG/ACT inhaler INHALE 1 TO 2 PUFFS INTO THE LUNGS EVERY 6 HOURS AS NEEDED FOR WHEEZING OR SHORTNESS OF BREATH 8.5 g 1  . ranolazine (RANEXA) 500 MG 12 hr tablet Take 1 tablet (500 mg total) by mouth 2 (two) times daily. 180 tablet 0  . sertraline (ZOLOFT) 50 MG tablet TAKE 1 TABLET (50 MG TOTAL) BY MOUTH DAILY. 90 tablet 1  . traZODone (DESYREL) 50 MG tablet TAKE 1/2 TO 1 TABLET(25 TO  50 MG) BY MOUTH AT BEDTIME AS NEEDED FOR SLEEP 30 tablet 5  . valACYclovir (VALTREX) 1000 MG tablet TAKE 1 TABLET BY MOUTH TWICE DAILY 10 tablet 0   No current facility-administered medications on file prior to visit.     No Known Allergies  Past Medical History:  Diagnosis Date  . Arthritis   . CAD (coronary artery disease)   . 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 Depression   . Ejection fraction   . GERD (gastroesophageal reflux disease)   . Hyperlipidemia   . Hypertension   . PONV (postoperative nausea and vomiting)   . Skin cancer of face 05/2015   basal cell  . Stroke [facility]) 2006   deaf in right ear    Past Surgical History:    Procedure Laterality Date  . BREAST BIOPSY Left 1984   abcess  . CESAREAN SECTION    . CHOLECYSTECTOMY  1990  . CHONDROPLASTY Left 01/05/2015   Procedure: CHONDROPLASTY;  Surgeon: Yvette Rack., MD;  Location: [place];  Service: Orthopedics;  Laterality: Left;  . KNEE ARTHROSCOPY WITH MEDIAL MENISECTOMY Left 01/05/2015   Procedure: LEFT KNEE ARTHROSCOPY WITH MEDIAL AND LATERAL MENISECTOMY; DEBRIDEMENT LATERAL PATELLA FEMORAL ;  Surgeon: Yvette Rack., MD;  Location: [place];  Service: Orthopedics;  Laterality: Left;  . LEFT HEART CATHETERIZATION WITH CORONARY ANGIOGRAM N/A 10/28/2013   Procedure: LEFT HEART CATHETERIZATION WITH CORONARY ANGIOGRAM;  Surgeon: Larey Dresser, MD;  Location: [facility] CATH LAB;  Service: Cardiovascular;  Laterality: N/A;  . TOTAL KNEE ARTHROPLASTY Left 07/06/2015   Procedure: TOTAL KNEE ARTHROPLASTY;  Surgeon: Earlie Server, MD;  Location: [place];  Service: Orthopedics;  Laterality: Left;  . TUBAL LIGATION      Family History  Problem Relation Age of Onset  . Healthy Mother   . Hypertension Mother   . Stroke Mother   . Stroke Father   . Heart failure Father   . Heart disease Father        CABG  . Ovarian cancer Paternal Grandmother   . Liver cancer Paternal Grandfather   . Stroke Maternal Grandmother   . Heart failure Maternal Grandmother   . Heart failure Maternal Grandfather   . Colon cancer Neg Hx   . Stomach cancer Neg Hx   . Rectal cancer Neg Hx   . Esophageal cancer Neg Hx   . Breast cancer Neg Hx     Social History   Socioeconomic History  . Marital status: Significant Other    Spouse name: [name]  . Number of children: 8  . Years of education: Not on file  . Highest education level: Not on file  Occupational History  . 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 Financial resource strain: Not on file  . Food insecurity:    Worry: Not on file     Inability: Not on file  . Transportation needs:    Medical: Not on file    Non-medical: Not on file  Tobacco Use  . Smoking status: Never Smoker  . Smokeless tobacco: Never Used  . Tobacco comment: LIVES WITH 2 SMOKERS   Substance and Sexual Activity  . Alcohol use: Yes    Comment: 1 glass of wine per month  . Drug use:  No  . Sexual activity: Not on file  Lifestyle  . Physical activity:    Days per week: Not on file    Minutes per session: Not on file  . Stress: Not on file  Relationships  . Social connections:    Talks on phone: Not on file    Gets together: Not on file    Attends religious service: Not on file    Active member of club or organization: Not on file    Attends meetings of clubs or organizations: Not on file    Relationship status: Not on file  . Intimate partner violence:    Fear of current or ex partner: Not on file    Emotionally abused: Not on file    Physically abused: Not on file    Forced sexual activity: Not on file  Other Topics Concern  . Not on file  Social History Narrative   Divorced, but has fiancee.  Lives with fiancee in [place].  Moved here recently from [place], [place].     She 8 children- ages 20- 12.               The PMH, PSH, Social History, Family History, Medications, and allergies have been reviewed in [facility], and have been updated if relevant.  Review of Systems  Psychiatric/Behavioral: Positive for decreased concentration, dysphoric mood and sleep disturbance. Negative for agitation, behavioral problems, confusion, hallucinations, self-injury and suicidal ideas. The patient is nervous/anxious. The patient is not hyperactive.   All other systems reviewed and are negative.      Objective:    BP 114/80 (BP Location: Left Arm, Patient Position: Sitting, Cuff Size: Normal)   Pulse 75   Temp 98.7 F (37.1 C) (Oral)   Ht 5\' 2"  (1.575 m)   Wt 204 lb 6.4 oz (92.7 kg)   SpO2 97%   BMI 37.39 kg/m    Physical Exam    Constitutional: She appears well-developed and well-nourished. No distress.  HENT:  Head: Normocephalic and atraumatic.  Eyes: EOM are normal.  Neck: Normal range of motion.  Cardiovascular: Normal rate.  Pulmonary/Chest: Effort normal.  Neurological: She is alert. No cranial nerve deficit.  Skin: Skin is warm. She is not diaphoretic.  Psychiatric: Her speech is normal and behavior is normal. Judgment and thought content normal. Her mood appears anxious. Cognition and memory are normal.  Nursing note and vitals reviewed.         Assessment & Plan:   No diagnosis found. No follow-ups on file.

## 2018-05-14 ENCOUNTER — Other Ambulatory Visit: Payer: Self-pay | Admitting: Family Medicine

## 2018-05-18 ENCOUNTER — Ambulatory Visit: Payer: Medicare HMO | Admitting: Psychology

## 2018-06-02 ENCOUNTER — Other Ambulatory Visit: Payer: Self-pay | Admitting: Family Medicine

## 2018-06-14 ENCOUNTER — Other Ambulatory Visit: Payer: Self-pay | Admitting: Family Medicine

## 2018-06-30 ENCOUNTER — Other Ambulatory Visit: Payer: Self-pay | Admitting: Family Medicine

## 2018-06-30 NOTE — Telephone Encounter (Signed)
Needs appt for refill per 6.17.19 visit note (return in 1 month)/thx dmf

## 2018-07-07 ENCOUNTER — Other Ambulatory Visit: Payer: Self-pay | Admitting: Family Medicine

## 2018-07-08 ENCOUNTER — Encounter: Payer: Medicare HMO | Admitting: Family Medicine

## 2018-07-08 ENCOUNTER — Encounter: Payer: Self-pay | Admitting: Family Medicine

## 2018-07-08 ENCOUNTER — Ambulatory Visit (INDEPENDENT_AMBULATORY_CARE_PROVIDER_SITE_OTHER): Payer: Medicare HMO | Admitting: Family Medicine

## 2018-07-08 ENCOUNTER — Ambulatory Visit: Payer: Self-pay | Admitting: Family Medicine

## 2018-07-08 VITALS — BP 126/80 | HR 88 | Temp 98.4°F | Ht 62.0 in | Wt 202.6 lb

## 2018-07-08 DIAGNOSIS — F4310 Post-traumatic stress disorder, unspecified: Secondary | ICD-10-CM | POA: Diagnosis not present

## 2018-07-08 DIAGNOSIS — F339 Major depressive disorder, recurrent, unspecified: Secondary | ICD-10-CM

## 2018-07-08 DIAGNOSIS — G4700 Insomnia, unspecified: Secondary | ICD-10-CM | POA: Diagnosis not present

## 2018-07-08 DIAGNOSIS — E2839 Other primary ovarian failure: Secondary | ICD-10-CM | POA: Diagnosis not present

## 2018-07-08 MED ORDER — SERTRALINE HCL 100 MG PO TABS: 150.0000 mg | ORAL_TABLET | Freq: Every day | ORAL | 3 refills | Status: DC

## 2018-07-08 MED ORDER — DIAZEPAM 10 MG PO TABS: ORAL_TABLET | ORAL | 0 refills | Status: DC

## 2018-07-08 NOTE — Progress Notes (Signed)
Subjective:   Patient ID: Diane [name], female    DOB: 22-Oct-1951, 67 y.o.   MRN: [id number]  Diane [name] is a pleasant 67 y.o. year old female who presents to clinic today with Follow-up (Patient is here today for a F/U for depression.  She was last seen on 5.7.19 and advised to set-up and OV with [facility] for Psychotherapy.  Her Zoloft was increased to 100mg  and Valium was increased to 5mg  tid (short term) and to F/U in 1 month.  She states that she has an appointment with someone for therapy in August and is concerned about it being so far away. PHQ-GAD filled out today.  She states that the Zoloft and Trazodone are helping her and she can sleep now.  Still feels that she wants to stay in) and addendum (stay in the bed but thinks that she is having more good days than bad days.  She agrees to have Mammogram and  BMD at [place] if order is placed.)  on 07/08/2018  HPI:  Here for follow up depression/PTSD-  I last saw her for this on 05/04/18.  Note reviewed. At that time she was taking zoloft 50 mg daily, trazodone 50 mg nightly and valium 5 mg twice daily as needed for anxiety.  This had been working well until her son had been in the ICU the weeks prior.  I increased her zoloft to 100 mg daily, referred her for psychotherapy (has appointment in August) and advised she increase her Valium to 5 mg up to three times daily as needed for severe panic attacks.  Advised to follow up with me in 1 month.  Here today because she feels she still has days that she just wants to stay in bed.  Having more good days than bad days now though.   Current Outpatient Medications on File Prior to Visit  Medication Sig Dispense Refill  . aspirin EC 81 MG tablet Take 1 tablet (81 mg total) by mouth daily. 90 tablet 3  . atorvastatin (LIPITOR) 80 MG tablet Take 1 tablet (80 mg total) by mouth daily. 90 tablet 0  . benazepril-hydrochlorthiazide (LOTENSIN HCT) 20-25 MG tablet Take 1 tablet by mouth daily.  90 tablet 0  . budesonide-formoterol (SYMBICORT) 80-4.5 MCG/ACT inhaler Inhale 2 puffs into the lungs 2 (two) times daily. 1 Inhaler 12  . butalbital-acetaminophen-caffeine (FIORICET WITH CODEINE) 50-325-40-30 MG capsule TAKE 1-2 CAPSULES BY MOUTH DAILY AS NEEDED FOR HEADACHE 30 capsule 0  . cholecalciferol (VITAMIN D) 1000 UNITS tablet Take 1,000 Units by mouth daily.    . cyclobenzaprine (FLEXERIL) 10 MG tablet Take 1 tablet (10 mg total) 3 (three) times daily as needed by mouth for muscle spasms. 30 tablet 0  . diclofenac sodium (VOLTAREN) 1 % GEL Apply 2 g 4 (four) times daily topically. 100 g 1  . furosemide (LASIX) 20 MG tablet Take 1 tablet (20 mg total) by mouth daily. 90 tablet 3  . PROAIR HFA 108 (90 Base) MCG/ACT inhaler INHALE 1 TO 2 PUFFS INTO THE LUNGS EVERY 6 HOURS AS NEEDED FOR WHEEZING OR SHORTNESS OF BREATH 8.5 g 1  . ranolazine (RANEXA) 500 MG 12 hr tablet Take 1 tablet (500 mg total) by mouth 2 (two) times daily. 180 tablet 0  . traZODone (DESYREL) 50 MG tablet TAKE 1/2 TO 1 TABLET(25 TO 50 MG) BY MOUTH AT BEDTIME AS NEEDED FOR SLEEP 30 tablet 5  . valACYclovir (VALTREX) 1000 MG tablet TAKE 1 TABLET BY MOUTH TWICE  DAILY 10 tablet 0  . diltiazem (CARDIZEM CD) 180 MG 24 hr capsule Take 1 capsule (180 mg total) by mouth daily. 90 capsule 3   No current facility-administered medications on file prior to visit.     No Known Allergies  Past Medical History:  Diagnosis Date  . Arthritis   . CAD (coronary artery disease)   . 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 Depression   . Ejection fraction   . GERD (gastroesophageal reflux disease)   . Hyperlipidemia   . Hypertension   . PONV (postoperative nausea and vomiting)   . Skin cancer of face 05/2015   basal cell  . Stroke [facility]) 2006   deaf in right ear    Past Surgical History:  Procedure Laterality Date  . BREAST BIOPSY Left 1984   abcess   . CESAREAN SECTION    . CHOLECYSTECTOMY  1990  . CHONDROPLASTY Left 01/05/2015   Procedure: CHONDROPLASTY;  Surgeon: Yvette Rack., MD;  Location: [place];  Service: Orthopedics;  Laterality: Left;  . KNEE ARTHROSCOPY WITH MEDIAL MENISECTOMY Left 01/05/2015   Procedure: LEFT KNEE ARTHROSCOPY WITH MEDIAL AND LATERAL MENISECTOMY; DEBRIDEMENT LATERAL PATELLA FEMORAL ;  Surgeon: Yvette Rack., MD;  Location: [place];  Service: Orthopedics;  Laterality: Left;  . LEFT HEART CATHETERIZATION WITH CORONARY ANGIOGRAM N/A 10/28/2013   Procedure: LEFT HEART CATHETERIZATION WITH CORONARY ANGIOGRAM;  Surgeon: Larey Dresser, MD;  Location: [facility] CATH LAB;  Service: Cardiovascular;  Laterality: N/A;  . TOTAL KNEE ARTHROPLASTY Left 07/06/2015   Procedure: TOTAL KNEE ARTHROPLASTY;  Surgeon: Earlie Server, MD;  Location: [place];  Service: Orthopedics;  Laterality: Left;  . TUBAL LIGATION      Family History  Problem Relation Age of Onset  . Healthy Mother   . Hypertension Mother   . Stroke Mother   . Stroke Father   . Heart failure Father   . Heart disease Father        CABG  . Ovarian cancer Paternal Grandmother   . Liver cancer Paternal Grandfather   . Stroke Maternal Grandmother   . Heart failure Maternal Grandmother   . Heart failure Maternal Grandfather   . Colon cancer Neg Hx   . Stomach cancer Neg Hx   . Rectal cancer Neg Hx   . Esophageal cancer Neg Hx   . Breast cancer Neg Hx     Social History   Socioeconomic History  . Marital status: Significant Other    Spouse name: [name]  . Number of children: 8  . Years of education: Not on file  . Highest education level: Not on file  Occupational History  . 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 Financial resource strain: Not on file  . Food insecurity:    Worry: Not on file    Inability: Not on file  . Transportation needs:    Medical: Not on  file    Non-medical: Not on file  Tobacco Use  . Smoking status: Never Smoker  . Smokeless tobacco: Never Used  . Tobacco comment: LIVES WITH 2 SMOKERS   Substance and Sexual Activity  . Alcohol use: Yes    Comment: 1 glass of wine per month  . Drug use: No  . Sexual activity:  Not on file  Lifestyle  . Physical activity:    Days per week: Not on file    Minutes per session: Not on file  . Stress: Not on file  Relationships  . Social connections:    Talks on phone: Not on file    Gets together: Not on file    Attends religious service: Not on file    Active member of club or organization: Not on file    Attends meetings of clubs or organizations: Not on file    Relationship status: Not on file  . Intimate partner violence:    Fear of current or ex partner: Not on file    Emotionally abused: Not on file    Physically abused: Not on file    Forced sexual activity: Not on file  Other Topics Concern  . Not on file  Social History Narrative   Divorced, but has fiancee.  Lives with fiancee in [place].  Moved here recently from [place], [place].     She 8 children- ages 93- 39.               The PMH, PSH, Social History, Family History, Medications, and allergies have been reviewed in [facility], and have been updated if relevant.   Review of Systems  Psychiatric/Behavioral: Positive for dysphoric mood. Negative for agitation, behavioral problems, confusion and decreased concentration. The patient is nervous/anxious.   All other systems reviewed and are negative.      Objective:    BP 126/80 (BP Location: Left Arm, Patient Position: Sitting, Cuff Size: Normal)   Pulse 88   Temp 98.4 F (36.9 C) (Oral)   Ht 5\' 2"  (1.575 m)   Wt 202 lb 9.6 oz (91.9 kg)   SpO2 95%   BMI 37.06 kg/m    Physical Exam  Constitutional: She is oriented to person, place, and time. She appears well-developed and well-nourished.  HENT:  Head: Normocephalic and atraumatic.  Cardiovascular:  Normal rate.  Pulmonary/Chest: Effort normal.  Musculoskeletal: Normal range of motion.  Neurological: She is alert and oriented to person, place, and time. No cranial nerve deficit.  Skin: She is not diaphoretic.  Psychiatric: She has a normal mood and affect. Her behavior is normal. Judgment and thought content normal.  Nursing note and vitals reviewed.         Assessment & Plan:   Estrogen deficiency - Plan: DG Bone Density  Insomnia, unspecified type  Depression, recurrent (HCC)  PTSD (post-traumatic stress disorder) No follow-ups on file.

## 2018-07-08 NOTE — Patient Instructions (Addendum)
Great to see you.  We are increasing your zoloft to 150 mg daily. The valium should be taken as 5 mg three times daily only AS NEEDED.  Keep me updated.  Hang in there.

## 2018-07-08 NOTE — Assessment & Plan Note (Addendum)
>  25 minutes spent in face to face time with patient, >50% spent in counselling or coordination of care Improving symptoms.  PHQ improved to 15.  She also did not realize that she was not supposed to take Valium scheduled- I explained this is only as needed. Increase zoloft 150 mg daily.  Keep appointment with psychotherapy. She will keep me updated in a few weeks.

## 2018-07-12 ENCOUNTER — Other Ambulatory Visit: Payer: Self-pay | Admitting: Family Medicine

## 2018-07-15 NOTE — Telephone Encounter (Signed)
This was faxed on 7.11.19/thx dmf

## 2018-07-18 ENCOUNTER — Other Ambulatory Visit: Payer: Self-pay | Admitting: Cardiovascular Disease

## 2018-07-18 ENCOUNTER — Other Ambulatory Visit: Payer: Self-pay | Admitting: Internal Medicine

## 2018-07-19 ENCOUNTER — Other Ambulatory Visit: Payer: Self-pay | Admitting: Family Medicine

## 2018-07-19 DIAGNOSIS — Z1231 Encounter for screening mammogram for malignant neoplasm of breast: Secondary | ICD-10-CM

## 2018-07-19 NOTE — Telephone Encounter (Signed)
This is a [facility] pt 

## 2018-07-19 NOTE — Telephone Encounter (Signed)
Please review for refill, Thanks !  

## 2018-07-20 ENCOUNTER — Other Ambulatory Visit: Payer: Self-pay

## 2018-07-20 MED ORDER — TRAZODONE HCL 150 MG PO TABS: 150.0000 mg | ORAL_TABLET | Freq: Every evening | ORAL | 1 refills | Status: DC | PRN

## 2018-07-20 NOTE — Progress Notes (Signed)
Per last office note Trazodone to be increased to 150mg /sent in new Rx and asked pharm to deactivate 50mg  Rx on file/thx dmf

## 2018-07-20 NOTE — Telephone Encounter (Signed)
50mg  Lucetta Baehr/C per last note start 150mg /sent in new Rx/thx dmf

## 2018-07-21 ENCOUNTER — Other Ambulatory Visit: Payer: Self-pay | Admitting: Family Medicine

## 2018-07-26 ENCOUNTER — Telehealth: Payer: Self-pay | Admitting: Family Medicine

## 2018-07-26 ENCOUNTER — Other Ambulatory Visit: Payer: Self-pay | Admitting: Internal Medicine

## 2018-07-26 MED ORDER — BENAZEPRIL-HYDROCHLOROTHIAZIDE 20-25 MG PO TABS: 1.0000 | ORAL_TABLET | Freq: Every day | ORAL | 0 refills | Status: DC

## 2018-07-26 NOTE — Telephone Encounter (Signed)
Copied from [facility] [id number]. Topic: Quick Communication - Rx Refill/Question >> Jul 26, 2018 12:20 PM Judyann Munson wrote: Medication: benazepril-hydrochlorthiazide (LOTENSIN HCT) 20-25 MG tablet  Has the patient contacted their pharmacy? No  Preferred Pharmacy (with phone number or street name): [facility], [address] [phone] (Phone) [phone] (Fax)      Agent: Please be advised that RX refills may take up to 3 business days. We ask that you follow-up with your pharmacy.

## 2018-07-29 ENCOUNTER — Ambulatory Visit: Payer: Self-pay | Admitting: Licensed Clinical Social Worker

## 2018-08-04 ENCOUNTER — Other Ambulatory Visit: Payer: Self-pay

## 2018-08-11 ENCOUNTER — Other Ambulatory Visit: Payer: Self-pay | Admitting: Family Medicine

## 2018-08-11 NOTE — Telephone Encounter (Signed)
zoloft was sent with refills 07/08/18. She was to make an another office visit in order to get additional valium refills. I will not authorize this at this time

## 2018-08-11 NOTE — Telephone Encounter (Signed)
Requesting: Diazepam Contract: None UDS: None Last OV: 7.11.19 Next OV: Due in January also sees therapist Last Refill: 7.11.19 per PMP ok   Please advise

## 2018-08-23 ENCOUNTER — Ambulatory Visit (INDEPENDENT_AMBULATORY_CARE_PROVIDER_SITE_OTHER): Payer: Medicare HMO | Admitting: Family Medicine

## 2018-08-23 ENCOUNTER — Encounter: Payer: Self-pay | Admitting: Family Medicine

## 2018-08-23 ENCOUNTER — Other Ambulatory Visit: Payer: Self-pay

## 2018-08-23 VITALS — BP 90/58 | HR 91 | Temp 98.3°F | Ht 62.0 in | Wt 202.2 lb

## 2018-08-23 DIAGNOSIS — F339 Major depressive disorder, recurrent, unspecified: Secondary | ICD-10-CM

## 2018-08-23 DIAGNOSIS — G8929 Other chronic pain: Secondary | ICD-10-CM

## 2018-08-23 DIAGNOSIS — R51 Headache: Secondary | ICD-10-CM

## 2018-08-23 DIAGNOSIS — G4700 Insomnia, unspecified: Secondary | ICD-10-CM

## 2018-08-23 DIAGNOSIS — I10 Essential (primary) hypertension: Secondary | ICD-10-CM | POA: Diagnosis not present

## 2018-08-23 DIAGNOSIS — F411 Generalized anxiety disorder: Secondary | ICD-10-CM

## 2018-08-23 DIAGNOSIS — E559 Vitamin D deficiency, unspecified: Secondary | ICD-10-CM

## 2018-08-23 DIAGNOSIS — S060X0A Concussion without loss of consciousness, initial encounter: Secondary | ICD-10-CM

## 2018-08-23 DIAGNOSIS — F4310 Post-traumatic stress disorder, unspecified: Secondary | ICD-10-CM

## 2018-08-23 DIAGNOSIS — F1190 Opioid use, unspecified, uncomplicated: Secondary | ICD-10-CM

## 2018-08-23 DIAGNOSIS — E785 Hyperlipidemia, unspecified: Secondary | ICD-10-CM

## 2018-08-23 DIAGNOSIS — R42 Dizziness and giddiness: Secondary | ICD-10-CM

## 2018-08-23 DIAGNOSIS — S060X9A Concussion with loss of consciousness of unspecified duration, initial encounter: Secondary | ICD-10-CM | POA: Insufficient documentation

## 2018-08-23 DIAGNOSIS — R519 Headache, unspecified: Secondary | ICD-10-CM

## 2018-08-23 DIAGNOSIS — S060XAA Concussion with loss of consciousness status unknown, initial encounter: Secondary | ICD-10-CM | POA: Insufficient documentation

## 2018-08-23 MED ORDER — ALPRAZOLAM 0.25 MG PO TABS: 0.2500 mg | ORAL_TABLET | Freq: Three times a day (TID) | ORAL | 0 refills | Status: DC | PRN

## 2018-08-23 MED ORDER — BENAZEPRIL-HYDROCHLOROTHIAZIDE 20-12.5 MG PO TABS: 1.0000 | ORAL_TABLET | Freq: Every day | ORAL | 3 refills | Status: DC

## 2018-08-23 NOTE — Patient Instructions (Addendum)
Great to see you. Please STOP taking your current lotensin (20-25 mg daily). Start taking lotensin 20-12.5 mg daily.   STOP taking valium and start taking xanax up to three times daily as needed.  Follow up blood pressure and wellness visit in 2 weeks.    Post-Concussion Syndrome Post-concussion syndrome is the symptoms that can occur after a head injury. These symptoms can last from weeks to months. Follow these instructions at home:  Take medicines only as told by your doctor.  Do not take aspirin.  Sleep with your head raised to help with headaches.  Avoid activities that can cause another head injury. ? Do not play contact sports like football, hockey, soccer, or basketball. ? Do not do other risky activities like downhill skiing, martial arts, or horseback riding until your doctor says it is okay.  Keep all follow-up visits as told by your doctor. This is important. Contact a doctor if:  You have a harder time: ? Paying attention. ? Focusing. ? Remembering. ? Learning new information. ? Dealing with stress.  You need more time to complete tasks.  You are easily bothered (irritable).  You have more symptoms. Get help if you have any of these symptoms for more than two weeks after your injury:  Long-lasting (chronic) headaches.  Dizziness.  Trouble balancing.  Feeling sick to your stomach (nauseous).  Trouble with your vision.  Noise or light bothers you more.  Depression.  Mood swings.  Feeling worried (anxious).  Easily bothered.  Memory problems.  Trouble concentrating or paying attention.  Sleep problems.  Feeling tired all of the time.  Get help right away if:  You feel confused.  You feel very sleepy.  You are hard to wake up.  You feel sick to your stomach.  You keep throwing up (vomiting).  You feel like you are moving when you are not (vertigo).  Your eyes move back and forth very quickly.  You start shaking (convulsing) or  pass out (faint).  You have very bad headaches that do not get better with medicine.  You cannot use your arms or legs like normal.  One of the black centers of your eyes (pupils) is bigger than the other.  You have clear or bloody fluid coming from your nose or ears.  Your problems get worse, not better. This information is not intended to replace advice given to you by your health care provider. Make sure you discuss any questions you have with your health care provider. Document Released: 01/22/2005 Document Revised: 05/22/2016 Document Reviewed: 03/22/2014 Elsevier Interactive Patient Education  2018 [employer].

## 2018-08-23 NOTE — Assessment & Plan Note (Signed)
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 Will decrease her benazapril-hctz to 20-12.5 mg daily.  Follow up in 2 weeks to recheck blood pressure and to reassess these symptoms. Also discussed brain rest- see AVS for details.

## 2018-08-23 NOTE — Assessment & Plan Note (Signed)
Deteriorated likely due to recent head trauma. See below.

## 2018-08-23 NOTE — Assessment & Plan Note (Addendum)
Improved with increased dose of zoloft and she will keep appointment with psychotherapist on 9/1. D/c valium as it is increasing her fall risk- making her feel "drunk."  I have sent in low dose xanax to be used sparingly, as needed for severe anxiety.  Explained to patient that xanax can also cause sedation and increased falls. The patient indicates understanding of these issues and agrees with the plan.  Controlled substances contract updated today, including UDS and [place] controlled substances database review.

## 2018-08-23 NOTE — Progress Notes (Signed)
Future orders placed for pt to get fasting labs completed on 9.11.19 between AWV & OV with TA/thx dmf

## 2018-08-23 NOTE — Assessment & Plan Note (Signed)
New- injury occurred two weeks ago. Discussed importance of brain rest.  Symptoms mild at this point, no further work up needed. Will follow this up in 2 weeks when she follows up for dizziness/hypotension.  Concussion is most likely contributing to her dizziness. The patient indicates understanding of these issues and agrees with the plan.

## 2018-08-23 NOTE — Assessment & Plan Note (Signed)
Well controlled with current dose of trazodone.  No changes made to this rx today.

## 2018-08-23 NOTE — Assessment & Plan Note (Signed)
Now with symptoms and signs of orthostatic hypotension. Decrease benazapril- hctz to 20-12.5 mg daily. Follow up in 2 weeks.

## 2018-08-23 NOTE — Assessment & Plan Note (Signed)
Improved

## 2018-08-23 NOTE — Progress Notes (Signed)
Subjective:   Patient ID: [name], female    DOB: December 27, 1951, 67 y.o.   MRN: [id number]  Diane [name] is a pleasant 67 y.o. year old female who presents to clinic today with Follow-up (Patient is here today to F/U with her medications.  She currently takes Valium 10mg  1/2 tid prn for anxiety and Fioricet with Codeine 1-2 prn H/A.  At 7.11.19 visit her Zoloft was increased to 150mg  and it was reiterated that the Valium was PRN only and to keep OV with Psychotherapy. PMP ok no red flags.  [place] printed for pt to sign.  Will schedule AWV with Glenard Haring and visit with Dr. Deborra Medina on a Wednesday. ) and Dizziness (Patient has been experiencing some new dizziness x2wks. She says that she has stumbled into a seat and a leg broke and she hit her head and has a headache since.  States that her gait is off.  She was very surprised at her BP being 90/58 today.)  on 08/23/2018  HPI:  Here for follow up.  I last saw her on 07/08/18 for follow up of depression/PTSD.  Note reviewed. At that [place] she was having increased days of "just wanted to stay in bed," although having more good than bad days. We increased her zoloft to 150 mg daily, advised to take valium as needed, continue trazodone nightly and to keep appointment (had not been seen yet) with psychotherapist. Her appointment is on 9/1.  Having much less anhedonia.  Her boyfriend with her today says her that this "is her best week yet." Has not taking valium in a couple of weeks- "it makes me drunk."  Chronic tension headaches- she takes Fioricet with codeine as needed for headache.  Today she reports that she has been dizzy for past 2 weeks.  She stumbled into a seat and a leg broke and she hit her head last week. Hit the back of her head quite hard on the ground.  No LOC but since that incident, she has been more dizzy with sensitivity to light and fogginess.  This is getting better.  Initially felt her gait was off but that has improved as well.  Did  not go to the ER or seek any medical care.  She does take benazepril- HCTZ 20-25 mg daily. BP Readings from Last 3 Encounters:  08/23/18 (!) 90/58  07/08/18 126/80  05/04/18 114/80      Current Outpatient Medications on File Prior to Visit  Medication Sig Dispense Refill  . aspirin EC 81 MG tablet Take 1 tablet (81 mg total) by mouth daily. 90 tablet 3  . atorvastatin (LIPITOR) 80 MG tablet TAKE 1 TABLET BY MOUTH DAILY 90 tablet 0  . budesonide-formoterol (SYMBICORT) 80-4.5 MCG/ACT inhaler Inhale 2 puffs into the lungs 2 (two) times daily. 1 Inhaler 12  . butalbital-acetaminophen-caffeine (FIORICET WITH CODEINE) 50-325-40-30 MG capsule TAKE 1-2 CAPSULES BY MOUTH DAILY AS NEEDED FOR HEADACHE 30 capsule 2  . cholecalciferol (VITAMIN D) 1000 UNITS tablet Take 1,000 Units by mouth daily.    . cyclobenzaprine (FLEXERIL) 10 MG tablet Take 1 tablet (10 mg total) 3 (three) times daily as needed by mouth for muscle spasms. 30 tablet 0  . diazepam (VALIUM) 10 MG tablet TAKE 1/2 TABLET BY MOUTH THREE TIMES A DAY AS NEEDED FOR ANXIETY 45 tablet 0  . diclofenac sodium (VOLTAREN) 1 % GEL Apply 2 g 4 (four) times daily topically. 100 g 1  . diltiazem (CARDIZEM CD) 180 MG 24  hr capsule Take 1 capsule (180 mg total) by mouth daily. 90 capsule 3  . furosemide (LASIX) 20 MG tablet Take 1 tablet (20 mg total) by mouth daily. 90 tablet 3  . PROAIR HFA 108 (90 Base) MCG/ACT inhaler INHALE 1 TO 2 PUFFS INTO THE LUNGS EVERY 6 HOURS AS NEEDED FOR WHEEZING OR SHORTNESS OF BREATH 8.5 g 1  . ranolazine (RANEXA) 500 MG 12 hr tablet TAKE 1 TABLET(500 MG) BY MOUTH TWICE DAILY 180 tablet 0  . sertraline (ZOLOFT) 100 MG tablet TAKE 1.5 TABLETS BY MOUTH DAILY 90 tablet 0  . traZODone (DESYREL) 150 MG tablet Take 1 tablet (150 mg total) by mouth at bedtime as needed for sleep. 90 tablet 1  . valACYclovir (VALTREX) 1000 MG tablet TAKE 1 TABLET BY MOUTH TWICE DAILY 10 tablet 0   No current facility-administered medications  on file prior to visit.     No Known Allergies  Past Medical History:  Diagnosis Date  . Arthritis   . CAD (coronary artery disease)   . 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 Depression   . Ejection fraction   . GERD (gastroesophageal reflux disease)   . Hyperlipidemia   . Hypertension   . PONV (postoperative nausea and vomiting)   . Skin cancer of face 05/2015   basal cell  . Stroke [facility]) 2006   deaf in right ear    Past Surgical History:  Procedure Laterality Date  . BREAST BIOPSY Left 1984   abcess  . CESAREAN SECTION    . CHOLECYSTECTOMY  1990  . CHONDROPLASTY Left 01/05/2015   Procedure: CHONDROPLASTY;  Surgeon: Yvette Rack., MD;  Location: [place];  Service: Orthopedics;  Laterality: Left;  . KNEE ARTHROSCOPY WITH MEDIAL MENISECTOMY Left 01/05/2015   Procedure: LEFT KNEE ARTHROSCOPY WITH MEDIAL AND LATERAL MENISECTOMY; DEBRIDEMENT LATERAL PATELLA FEMORAL ;  Surgeon: Yvette Rack., MD;  Location: [place];  Service: Orthopedics;  Laterality: Left;  . LEFT HEART CATHETERIZATION WITH CORONARY ANGIOGRAM N/A 10/28/2013   Procedure: LEFT HEART CATHETERIZATION WITH CORONARY ANGIOGRAM;  Surgeon: Larey Dresser, MD;  Location: [facility] C[facility] LAB;  Service: Cardiovascular;  Laterality: N/A;  . TOTAL KNEE ARTHROPLASTY Left 07/06/2015   Procedure: TOTAL KNEE ARTHROPLASTY;  Surgeon: Earlie Server, MD;  Location: [place];  Service: Orthopedics;  Laterality: Left;  . TUBAL LIGATION      Family History  Problem Relation Age of Onset  . Healthy Mother   . Hypertension Mother   . Stroke Mother   . Stroke Father   . Heart failure Father   . Heart disease Father        CABG  . Ovarian cancer Paternal Grandmother   . Liver cancer Paternal Grandfather   . Stroke Maternal Grandmother   . Heart failure Maternal Grandmother   . Heart failure Maternal Grandfather   . Colon  cancer Neg Hx   . Stomach cancer Neg Hx   . Rectal cancer Neg Hx   . Esophageal cancer Neg Hx   . Breast cancer Neg Hx     Social History   Socioeconomic History  . Marital status: Significant Other    Spouse name: [name]  . Number of children: 8  . Years of education: Not on file  . Highest education level: Not on file  Occupational History  .  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 Financial resource strain: Not on file  . Food insecurity:    Worry: Not on file    Inability: Not on file  . Transportation needs:    Medical: Not on file    Non-medical: Not on file  Tobacco Use  . Smoking status: Never Smoker  . Smokeless tobacco: Never Used  . Tobacco comment: LIVES WITH 2 SMOKERS   Substance and Sexual Activity  . Alcohol use: Yes    Comment: 1 glass of wine per month  . Drug use: No  . Sexual activity: Not on file  Lifestyle  . Physical activity:    Days per week: Not on file    Minutes per session: Not on file  . Stress: Not on file  Relationships  . Social connections:    Talks on phone: Not on file    Gets together: Not on file    Attends religious service: Not on file    Active member of club or organization: Not on file    Attends meetings of clubs or organizations: Not on file    Relationship status: Not on file  . Intimate partner violence:    Fear of current or ex partner: Not on file    Emotionally abused: Not on file    Physically abused: Not on file    Forced sexual activity: Not on file  Other Topics Concern  . Not on file  Social History Narrative   Divorced, but has fiancee.  Lives with fiancee in [place].  Moved here recently from [name], [place].     She 8 children- ages 18- 85.               The PMH, PSH, Social History, Family History, Medications, and allergies have been reviewed in [facility], and have been updated if relevant.   Review of Systems  Eyes: Positive for  photophobia.  Respiratory: Negative.   Cardiovascular: Negative.   Musculoskeletal: Positive for back pain and gait problem.  Skin: Negative.   Neurological: Positive for dizziness and headaches. Negative for tremors, seizures, syncope, facial asymmetry, speech difficulty, weakness, light-headedness and numbness.  Psychiatric/Behavioral: Negative for agitation, behavioral problems, confusion, decreased concentration, dysphoric mood, hallucinations and self-injury. The patient is not nervous/anxious and is not hyperactive.   All other systems reviewed and are negative.      Objective:    BP (!) 90/58 (BP Location: Left Arm, Patient Position: Sitting, Cuff Size: Normal)   Pulse 91   Temp 98.3 F (36.8 C) (Oral)   Ht 5\' 2"  (1.575 m)   Wt 202 lb 3.2 oz (91.7 kg)   SpO2 98%   BMI 36.98 kg/m    Physical Exam  Constitutional: She is oriented to person, place, and time. She appears well-developed and well-nourished. No distress.  HENT:  Head: Normocephalic and atraumatic.  TTP over right occipital area where she hit her head  Eyes: EOM are normal.  Neck: Normal range of motion.  Cardiovascular: Regular rhythm. Tachycardia present.  Pulmonary/Chest: Effort normal and breath sounds normal.  Musculoskeletal: Normal range of motion. She exhibits no edema.  Neurological: She is alert and oriented to person, place, and time. No cranial nerve deficit.  Skin: Skin is warm and dry. She is not diaphoretic.  Psychiatric: She has a normal mood and affect. Her behavior is normal. Judgment and thought content normal.  Nursing note and vitals  reviewed.         Assessment & Plan:   PTSD (post-traumatic stress disorder) - Plan: Pain Mgmt, Profile 8 w/Conf, U  Depression, recurrent (HCC)  Insomnia, unspecified type  Chronic nonintractable headache, unspecified headache type  Chronic narcotic use - Plan: Pain Mgmt, Profile 8 w/Conf, U  Dizziness  Essential hypertension No follow-ups  on file.

## 2018-08-24 ENCOUNTER — Ambulatory Visit
Admission: RE | Admit: 2018-08-24 | Discharge: 2018-08-24 | Disposition: A | Discharge status: Home / Self Care | Payer: Medicare HMO | Source: Ambulatory Visit | Attending: Family Medicine | Admitting: Family Medicine

## 2018-08-24 DIAGNOSIS — E2839 Other primary ovarian failure: Secondary | ICD-10-CM | POA: Insufficient documentation

## 2018-08-24 DIAGNOSIS — Z780 Asymptomatic menopausal state: Secondary | ICD-10-CM | POA: Diagnosis not present

## 2018-08-24 DIAGNOSIS — M8589 Other specified disorders of bone density and structure, multiple sites: Secondary | ICD-10-CM | POA: Diagnosis not present

## 2018-08-24 DIAGNOSIS — Z1231 Encounter for screening mammogram for malignant neoplasm of breast: Secondary | ICD-10-CM | POA: Insufficient documentation

## 2018-08-25 LAB — PAIN MGMT, PROFILE 8 W/CONF, U
6 ACETYLMORPHINE: NEGATIVE ng/mL (ref <10)
ALPHAHYDROXYMIDAZOLAM: NEGATIVE ng/mL (ref <50)
ALPHAHYDROXYTRIAZOLAM: NEGATIVE ng/mL (ref <50)
AMINOCLONAZEPAM: 32 ng/mL — AB (ref <25)
Alcohol Metabolites: NEGATIVE ng/mL (ref <500)
Alphahydroxyalprazolam: NEGATIVE ng/mL (ref <25)
Amphetamines: NEGATIVE ng/mL (ref <500)
Benzodiazepines: POSITIVE ng/mL — AB (ref <100)
Buprenorphine, Urine: NEGATIVE ng/mL (ref <5)
CREATININE: 14.8 mg/dL — AB
Cocaine Metabolite: NEGATIVE ng/mL (ref <150)
Hydroxyethylflurazepam: NEGATIVE ng/mL (ref <50)
Lorazepam: NEGATIVE ng/mL (ref <50)
MDMA: NEGATIVE ng/mL (ref <500)
Marijuana Metabolite: NEGATIVE ng/mL (ref <20)
NORDIAZEPAM: 52 ng/mL — AB (ref <50)
OPIATES: NEGATIVE ng/mL (ref <100)
OXAZEPAM: 151 ng/mL — AB (ref <50)
OXIDANT: NEGATIVE ug/mL (ref <200)
Oxycodone: NEGATIVE ng/mL (ref <100)
PH: 6.62 (ref 4.5–9.0)
SPECIFIC GRAVITY: 1.007 (ref >=1.0)
TEMAZEPAM: 83 ng/mL — AB (ref <50)

## 2018-08-26 ENCOUNTER — Ambulatory Visit (INDEPENDENT_AMBULATORY_CARE_PROVIDER_SITE_OTHER): Payer: Medicare HMO | Admitting: Family Medicine

## 2018-08-26 ENCOUNTER — Encounter: Payer: Self-pay | Admitting: Family Medicine

## 2018-08-26 VITALS — BP 110/80 | HR 76 | Temp 98.3°F | Ht 62.0 in | Wt 203.0 lb

## 2018-08-26 DIAGNOSIS — M810 Age-related osteoporosis without current pathological fracture: Secondary | ICD-10-CM | POA: Diagnosis not present

## 2018-08-26 MED ORDER — ALENDRONATE SODIUM 70 MG PO TABS: 70.0000 mg | ORAL_TABLET | ORAL | 11 refills | Status: DC

## 2018-08-26 NOTE — Patient Instructions (Signed)
Great to see you.  Please start taking OTC caltrate as directed.  Fosamax- 1 tablet weekly as we discussed today.  Please keep me updated.

## 2018-08-26 NOTE — Progress Notes (Signed)
Subjective:   Patient ID: Diane [name], female    DOB: 01/29/1951, 67 y.o.   MRN: [id number]  Diane WYSONG is a pleasant 67 y.o. year old female who presents to clinic today with Osteoporosis (Patient is here today to discuss Tx for Osteoporosis.  BMD completed on 8.27.19 measured at Femur Neck with a T-Score of -2.5.  AP Spine L1-4 measured at -2.1.  )  on 08/26/2018  HPI:  Osteoporosis- new diagnosis.  Here to discuss osteoporosis and to come up with a treatment plan today. DEXA showed osteoporosis in her femur- T score of -2.5, and osteopenia of her spine with T score of-2.1.  Dg Bone Density  Result Date: 08/24/2018 EXAM: DUAL X-RAY ABSORPTIOMETRY (DXA) FOR BONE MINERAL DENSITY IMPRESSION: Dear Dr. Deborra Medina, Your patient Diane [name] completed a BMD test on 08/24/2018 using the [place] (analysis version: 14.10) manufactured by [employer]. The following summarizes the results of our evaluation. PATIENT BIOGRAPHICAL: Name: Diane [name] Patient ID: [id number] Birth Date: 01/21/1951 Height: 62.0 in. Gender: Female Exam Date: 08/24/2018 Weight: 199.7 lbs. Indications: Postmenopausal Fractures: Treatments: Vitamin D ASSESSMENT: The BMD measured at Femur Neck Left is 0.686 g/cm2 with a T-score of -2.5. This patient is considered OSTEOPOROTIC according to [facility]) criteria. Site Region Measured Measured WHO Young Adult BMD Date       Age      Classification T-score AP Spine L1-L4 08/24/2018 65.9 Osteopenia -2.1 0.929 g/cm2 DualFemur Neck Left 08/24/2018 65.9 Osteoporosis -2.5 0.686 g/cm2 World Health Organization [facility]) criteria for post-menopausal, Caucasian Women: Normal:       T-score at or above -1 SD Osteopenia:   T-score between -1 and -2.5 SD Osteoporosis: T-score at or below -2.5 SD RECOMMENDATIONS: 1. All patients should optimize calcium and vitamin D intake. 2. Consider FDA-approved medical therapies in postmenopausal women and men aged 68  years and older, based on the following: a. A hip or vertebral(clinical or morphometric) fracture b. T-score < -2.5 at the femoral neck or spine after appropriate evaluation to exclude secondary causes c. Low bone mass (T-score between -1.0 and -2.5 at the femoral neck or spine) and a 10-year probability of a hip fracture > 3% or a 10-year probability of a major osteoporosis-related fracture > 20% based on the [place]-adapted WHO algorithm d. Clinician judgment and/or patient preferences may indicate treatment for people with 10-year fracture probabilities above or below these levels FOLLOW-UP: People with diagnosed cases of osteoporosis or at high risk for fracture should have regular bone mineral density tests. For patients eligible for Medicare, routine testing is allowed once every 2 years. The testing frequency can be increased to one year for patients who have rapidly progressing disease, those who are receiving or discontinuing medical therapy to restore bone mass, or have additional risk factors. I have reviewed this report, and agree with the above findings. Mark A. Thornton Papas, M.D. [facility] Radiology Electronically Signed   By: Lavonia Dana M.D.   On: 08/24/2018 11:56   Mm 3d Screen Breast Bilateral  Result Date: 08/24/2018 CLINICAL DATA:  Screening. EXAM: DIGITAL SCREENING BILATERAL MAMMOGRAM WITH TOMO AND CAD COMPARISON:  Previous exam(s). ACR Breast Density Category a: The breast tissue is almost entirely fatty. FINDINGS: There are no findings suspicious for malignancy. Images were processed with CAD. IMPRESSION: No mammographic evidence of malignancy. A result letter of this screening mammogram will be mailed directly to the patient. RECOMMENDATION: Screening mammogram in one year. (Code:SM-B-01Y) BI-RADS CATEGORY  1: Negative. Electronically Signed   By: Curlene Dolphin M.D.   On: 08/24/2018 12:46    Current Outpatient Medications on File Prior to Visit  Medication Sig Dispense Refill  . ALPRAZolam  (XANAX) 0.25 MG tablet Take 1 tablet (0.25 mg total) by mouth 3 (three) times daily as needed for anxiety. 30 tablet 0  . aspirin EC 81 MG tablet Take 1 tablet (81 mg total) by mouth daily. 90 tablet 3  . atorvastatin (LIPITOR) 80 MG tablet TAKE 1 TABLET BY MOUTH DAILY 90 tablet 0  . benazepril-hydrochlorthiazide (LOTENSIN HCT) 20-12.5 MG tablet Take 1 tablet by mouth daily. 30 tablet 3  . budesonide-formoterol (SYMBICORT) 80-4.5 MCG/ACT inhaler Inhale 2 puffs into the lungs 2 (two) times daily. 1 Inhaler 12  . butalbital-acetaminophen-caffeine (FIORICET WITH CODEINE) 50-325-40-30 MG capsule TAKE 1-2 CAPSULES BY MOUTH DAILY AS NEEDED FOR HEADACHE 30 capsule 2  . cholecalciferol (VITAMIN D) 1000 UNITS tablet Take 1,000 Units by mouth daily.    . cyclobenzaprine (FLEXERIL) 10 MG tablet Take 1 tablet (10 mg total) 3 (three) times daily as needed by mouth for muscle spasms. 30 tablet 0  . diclofenac sodium (VOLTAREN) 1 % GEL Apply 2 g 4 (four) times daily topically. 100 g 1  . furosemide (LASIX) 20 MG tablet Take 1 tablet (20 mg total) by mouth daily. 90 tablet 3  . PROAIR HFA 108 (90 Base) MCG/ACT inhaler INHALE 1 TO 2 PUFFS INTO THE LUNGS EVERY 6 HOURS AS NEEDED FOR WHEEZING OR SHORTNESS OF BREATH 8.5 g 1  . ranolazine (RANEXA) 500 MG 12 hr tablet TAKE 1 TABLET(500 MG) BY MOUTH TWICE DAILY 180 tablet 0  . sertraline (ZOLOFT) 100 MG tablet TAKE 1.5 TABLETS BY MOUTH DAILY 90 tablet 0  . traZODone (DESYREL) 150 MG tablet Take 1 tablet (150 mg total) by mouth at bedtime as needed for sleep. 90 tablet 1  . valACYclovir (VALTREX) 1000 MG tablet TAKE 1 TABLET BY MOUTH TWICE DAILY 10 tablet 0  . diltiazem (CARDIZEM CD) 180 MG 24 hr capsule Take 1 capsule (180 mg total) by mouth daily. 90 capsule 3   No current facility-administered medications on file prior to visit.     No Known Allergies  Past Medical History:  Diagnosis Date  . Arthritis   . CAD (coronary artery disease)   . CVA (cerebral  infarction) 1997   right sided weakeness and deaf in right ear  . 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 Depression   . Ejection fraction   . GERD (gastroesophageal reflux disease)   . Hyperlipidemia   . Hypertension   . PONV (postoperative nausea and vomiting)   . Skin cancer of face 05/2015   basal cell  . Stroke [facility]) 2006   deaf in right ear    Past Surgical History:  Procedure Laterality Date  . BREAST BIOPSY Left 1984   abcess  . CESAREAN SECTION    . CHOLECYSTECTOMY  1990  . CHONDROPLASTY Left 01/05/2015   Procedure: CHONDROPLASTY;  Surgeon: Yvette Rack., MD;  Location: [place];  Service: Orthopedics;  Laterality: Left;  . KNEE ARTHROSCOPY WITH MEDIAL MENISECTOMY Left 01/05/2015   Procedure: LEFT KNEE ARTHROSCOPY WITH MEDIAL AND LATERAL MENISECTOMY; DEBRIDEMENT LATERAL PATELLA FEMORAL ;  Surgeon: Yvette Rack., MD;  Location: [place];  Service: Orthopedics;  Laterality: Left;  . LEFT HEART CATHETERIZATION WITH CORONARY ANGIOGRAM N/A 10/28/2013   Procedure: LEFT HEART CATHETERIZATION  WITH CORONARY ANGIOGRAM;  Surgeon: Larey Dresser, MD;  Location: [facility] CATH LAB;  Service: Cardiovascular;  Laterality: N/A;  . TOTAL KNEE ARTHROPLASTY Left 07/06/2015   Procedure: TOTAL KNEE ARTHROPLASTY;  Surgeon: Earlie Server, MD;  Location: [place];  Service: Orthopedics;  Laterality: Left;  . TUBAL LIGATION      Family History  Problem Relation Age of Onset  . Healthy Mother   . Hypertension Mother   . Stroke Mother   . Stroke Father   . Heart failure Father   . Heart disease Father        CABG  . Ovarian cancer Paternal Grandmother   . Liver cancer Paternal Grandfather   . Stroke Maternal Grandmother   . Heart failure Maternal Grandmother   . Heart failure Maternal Grandfather   . Colon cancer Neg Hx   . Stomach cancer Neg Hx   . Rectal cancer Neg Hx   . Esophageal cancer Neg Hx   . Breast cancer Neg Hx     Social History   Socioeconomic  History  . Marital status: Significant Other    Spouse name: [name]  . Number of children: 8  . Years of education: Not on file  . Highest education level: Not on file  Occupational History  . 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 Financial resource strain: Not on file  . Food insecurity:    Worry: Not on file    Inability: Not on file  . Transportation needs:    Medical: Not on file    Non-medical: Not on file  Tobacco Use  . Smoking status: Never Smoker  . Smokeless tobacco: Never Used  . Tobacco comment: LIVES WITH 2 SMOKERS   Substance and Sexual Activity  . Alcohol use: Yes    Comment: 1 glass of wine per month  . Drug use: No  . Sexual activity: Not on file  Lifestyle  . Physical activity:    Days per week: Not on file    Minutes per session: Not on file  . Stress: Not on file  Relationships  . Social connections:    Talks on phone: Not on file    Gets together: Not on file    Attends religious service: Not on file    Active member of club or organization: Not on file    Attends meetings of clubs or organizations: Not on file    Relationship status: Not on file  . Intimate partner violence:    Fear of current or ex partner: Not on file    Emotionally abused: Not on file    Physically abused: Not on file    Forced sexual activity: Not on file  Other Topics Concern  . Not on file  Social History Narrative   Divorced, but has fiancee.  Lives with fiancee in [place].  Moved here recently from [place], [place].     She 8 children- ages 21- 83.               The PMH, PSH, Social History, Family History, Medications, and allergies have been reviewed in [facility], and have been updated if relevant.   Review of Systems  Musculoskeletal: Positive for arthralgias, back pain, gait problem and joint swelling.  All other systems reviewed and are negative.      Objective:    BP 110/80 (BP Location: Left Arm,  Patient Position: Sitting, Cuff Size:  Normal)   Pulse 76   Temp 98.3 F (36.8 C) (Oral)   Ht 5\' 2"  (1.575 m)   Wt 203 lb (92.1 kg)   SpO2 99%   BMI 37.13 kg/m    Physical Exam  Constitutional: She is oriented to person, place, and time. She appears well-developed and well-nourished. No distress.  HENT:  Head: Normocephalic and atraumatic.  Eyes: EOM are normal.  Neck: Normal range of motion.  Cardiovascular: Normal rate.  Pulmonary/Chest: Effort normal.  Musculoskeletal: Normal range of motion. She exhibits no edema.  Neurological: She is alert and oriented to person, place, and time. No cranial nerve deficit.  Skin: Skin is warm and dry. She is not diaphoretic.  Psychiatric: She has a normal mood and affect. Her behavior is normal. Judgment and thought content normal.  Nursing note and vitals reviewed.         Assessment & Plan:   Osteoporosis of femur without pathological fracture No follow-ups on file.

## 2018-08-26 NOTE — Assessment & Plan Note (Signed)
>  15 minutes spent in face to face time with patient, >50% spent in counselling or coordination of care discussing new diagnosis of osteoporosis.  She is not currently taking a calcium supplement but she is taking Vitamin D OTC.  Advised to start Caltrate and weight bearing exercise.  eRx sent for fosamax 70 mg weekly.  Repeat DEXA in 1 year.  Discussed side effects of fosamax.  She does have a h/o GERD so if she is intolerant to it, she will call me and we can consider alternatives such as prolia which would bypass her GI tract. The patient indicates understanding of these issues and agrees with the plan.

## 2018-08-31 ENCOUNTER — Ambulatory Visit (INDEPENDENT_AMBULATORY_CARE_PROVIDER_SITE_OTHER): Payer: Medicare HMO | Admitting: Licensed Clinical Social Worker

## 2018-08-31 DIAGNOSIS — F331 Major depressive disorder, recurrent, moderate: Secondary | ICD-10-CM

## 2018-09-07 ENCOUNTER — Ambulatory Visit (INDEPENDENT_AMBULATORY_CARE_PROVIDER_SITE_OTHER): Payer: Medicare HMO | Admitting: Licensed Clinical Social Worker

## 2018-09-07 DIAGNOSIS — F331 Major depressive disorder, recurrent, moderate: Secondary | ICD-10-CM | POA: Diagnosis not present

## 2018-09-08 ENCOUNTER — Ambulatory Visit: Payer: Medicare HMO | Admitting: Behavioral Health

## 2018-09-08 ENCOUNTER — Ambulatory Visit (INDEPENDENT_AMBULATORY_CARE_PROVIDER_SITE_OTHER): Payer: Medicare HMO | Admitting: Family Medicine

## 2018-09-08 VITALS — BP 124/76 | HR 78 | Temp 98.6°F | Wt 200.0 lb

## 2018-09-08 DIAGNOSIS — Z23 Encounter for immunization: Secondary | ICD-10-CM

## 2018-09-08 DIAGNOSIS — I10 Essential (primary) hypertension: Secondary | ICD-10-CM

## 2018-09-08 DIAGNOSIS — M810 Age-related osteoporosis without current pathological fracture: Secondary | ICD-10-CM | POA: Diagnosis not present

## 2018-09-08 DIAGNOSIS — Z79899 Other long term (current) drug therapy: Secondary | ICD-10-CM

## 2018-09-08 DIAGNOSIS — E785 Hyperlipidemia, unspecified: Secondary | ICD-10-CM | POA: Diagnosis not present

## 2018-09-08 DIAGNOSIS — F411 Generalized anxiety disorder: Secondary | ICD-10-CM

## 2018-09-08 DIAGNOSIS — E559 Vitamin D deficiency, unspecified: Secondary | ICD-10-CM

## 2018-09-08 DIAGNOSIS — I5032 Chronic diastolic (congestive) heart failure: Secondary | ICD-10-CM

## 2018-09-08 LAB — CBC WITH DIFFERENTIAL/PLATELET
Basophils Absolute: 0 10*3/uL (ref 0.0–0.1)
Basophils Relative: 0.8 % (ref 0.0–3.0)
EOS PCT: 2.8 % (ref 0.0–5.0)
Eosinophils Absolute: 0.1 10*3/uL (ref 0.0–0.7)
HCT: 41 % (ref 36.0–46.0)
Hemoglobin: 14.1 g/dL (ref 12.0–15.0)
LYMPHS ABS: 1.5 10*3/uL (ref 0.7–4.0)
Lymphocytes Relative: 31.8 % (ref 12.0–46.0)
MCHC: 34.5 g/dL (ref 30.0–36.0)
MCV: 97.3 fl (ref 78.0–100.0)
MONOS PCT: 6.3 % (ref 3.0–12.0)
Monocytes Absolute: 0.3 10*3/uL (ref 0.1–1.0)
Neutro Abs: 2.8 10*3/uL (ref 1.4–7.7)
Neutrophils Relative %: 58.3 % (ref 43.0–77.0)
Platelets: 182 10*3/uL (ref 150.0–400.0)
RBC: 4.21 Mil/uL (ref 3.87–5.11)
RDW: 12.8 % (ref 11.5–15.5)
WBC: 4.7 10*3/uL (ref 4.0–10.5)

## 2018-09-08 LAB — LIPID PANEL
CHOLESTEROL: 164 mg/dL (ref 0–200)
HDL: 65.3 mg/dL (ref >39.00)
LDL Cholesterol: 68 mg/dL (ref 0–99)
NonHDL: 99.15
TRIGLYCERIDES: 154 mg/dL — AB (ref 0.0–149.0)
Total CHOL/HDL Ratio: 3
VLDL: 30.8 mg/dL (ref 0.0–40.0)

## 2018-09-08 LAB — COMPREHENSIVE METABOLIC PANEL
ALBUMIN: 4.4 g/dL (ref 3.5–5.2)
ALT: 23 U/L (ref 0–35)
AST: 16 U/L (ref 0–37)
Alkaline Phosphatase: 57 U/L (ref 39–117)
BUN: 10 mg/dL (ref 6–23)
CO2: 30 mEq/L (ref 19–32)
Calcium: 10 mg/dL (ref 8.4–10.5)
Chloride: 100 mEq/L (ref 96–112)
Creatinine, Ser: 0.83 mg/dL (ref 0.40–1.20)
GFR: 73.1 mL/min (ref >60.00)
Glucose, Bld: 101 mg/dL — ABNORMAL HIGH (ref 70–99)
POTASSIUM: 4.1 meq/L (ref 3.5–5.1)
Sodium: 138 mEq/L (ref 135–145)
Total Bilirubin: 0.5 mg/dL (ref 0.2–1.2)
Total Protein: 7.3 g/dL (ref 6.0–8.3)

## 2018-09-08 LAB — TSH: TSH: 1.86 u[IU]/mL (ref 0.35–4.50)

## 2018-09-08 LAB — VITAMIN D 25 HYDROXY (VIT D DEFICIENCY, FRACTURES): VITD: 48.19 ng/mL (ref 30.00–100.00)

## 2018-09-08 MED ORDER — SERTRALINE HCL 100 MG PO TABS: 150.0000 mg | ORAL_TABLET | Freq: Every day | ORAL | 0 refills | Status: DC

## 2018-09-08 MED ORDER — DIAZEPAM 5 MG PO TABS: 5.0000 mg | ORAL_TABLET | Freq: Two times a day (BID) | ORAL | 1 refills | Status: DC | PRN

## 2018-09-08 NOTE — Assessment & Plan Note (Addendum)
Deteriorated-  over all by adding pschotherapy she feels she has a better plan but both she and her therapist feel she would benefit from increasing dose of zoloft- eRx sent for zoloft 150 mg daily, and by stopping xanax and restarting valium but taking it ONLY as a last resort for panic attack.  eRx sent. Continue weekly psychotherapy.  >25 minutes spent in face to face time with patient, >50% spent in counselling or coordination of care

## 2018-09-08 NOTE — Assessment & Plan Note (Signed)
>>  ASSESSMENT AND PLAN FOR OSTEOPOROSIS WRITTEN ON 09/08/2018  9:53 AM BY Lucille Passy, MD  Could not tolerate fosamax. Will order prolia for pt.

## 2018-09-08 NOTE — Patient Instructions (Addendum)
Great to see you. We are increasing your zoloft to 150 mg daily - take 1 and 1/2 of what you have at home (100 mg ) but I have sent in a new prescription.  STOP taking xanax. Start Valium 5 mg daily twice needed for severe anxiety - please use sparingly.

## 2018-09-08 NOTE — Assessment & Plan Note (Signed)
Could not tolerate fosamax. Will order prolia for pt.

## 2018-09-08 NOTE — Progress Notes (Addendum)
Subjective:   Patient ID: Diane [name], female    DOB: 14-Apr-1951, 67 y.o.   MRN: [id number]  Diane [name] is a pleasant 67 y.o. year old female who presents to clinic today with Follow-up (pt. would like to discuss some of her most recent medications, Fosamax, Xanax & Zoloft; she reports that the medications seem to be ineffective at this time)  on 09/08/2018  HPI:  GAD- started seeing a therapist, Gregary Signs.  Has seen her twice and is now seeing her weekly. She gave her some goals and tools- deep breathing defining short term goals, every day, don't go straight to taking a xanax.  Gregary Signs agreed with pt that xanax isn't strong enough but wanted her to not go straight to taking it when she felt anxious.  After taking two xanax in the therapist's office, she was still shaking.  Valium worked to help with anxiety and shaking.  Her therapist also felt her zoloft dose needed to be increased- currently taking 100 mg of zoloft daily.  Osteoporosis- tried taking fosamax, caused GERD and joint pain.   Current Outpatient Medications on File Prior to Visit  Medication Sig Dispense Refill  . alendronate (FOSAMAX) 70 MG tablet Take 1 tablet (70 mg total) by mouth every 7 (seven) days. Take with a full glass of water on an empty stomach. 4 tablet 11  . ALPRAZolam (XANAX) 0.25 MG tablet Take 1 tablet (0.25 mg total) by mouth 3 (three) times daily as needed for anxiety. 30 tablet 0  . aspirin EC 81 MG tablet Take 1 tablet (81 mg total) by mouth daily. 90 tablet 3  . atorvastatin (LIPITOR) 80 MG tablet TAKE 1 TABLET BY MOUTH DAILY 90 tablet 0  . benazepril-hydrochlorthiazide (LOTENSIN HCT) 20-12.5 MG tablet Take 1 tablet by mouth daily. 30 tablet 3  . budesonide-formoterol (SYMBICORT) 80-4.5 MCG/ACT inhaler Inhale 2 puffs into the lungs 2 (two) times daily. 1 Inhaler 12  . butalbital-acetaminophen-caffeine (FIORICET WITH CODEINE) 50-325-40-30 MG capsule TAKE 1-2 CAPSULES BY MOUTH DAILY AS NEEDED FOR  HEADACHE 30 capsule 2  . cholecalciferol (VITAMIN D) 1000 UNITS tablet Take 1,000 Units by mouth daily.    . cyclobenzaprine (FLEXERIL) 10 MG tablet Take 1 tablet (10 mg total) 3 (three) times daily as needed by mouth for muscle spasms. 30 tablet 0  . diclofenac sodium (VOLTAREN) 1 % GEL Apply 2 g 4 (four) times daily topically. 100 g 1  . furosemide (LASIX) 20 MG tablet Take 1 tablet (20 mg total) by mouth daily. 90 tablet 3  . PROAIR HFA 108 (90 Base) MCG/ACT inhaler INHALE 1 TO 2 PUFFS INTO THE LUNGS EVERY 6 HOURS AS NEEDED FOR WHEEZING OR SHORTNESS OF BREATH 8.5 g 1  . ranolazine (RANEXA) 500 MG 12 hr tablet TAKE 1 TABLET(500 MG) BY MOUTH TWICE DAILY 180 tablet 0  . sertraline (ZOLOFT) 100 MG tablet TAKE 1.5 TABLETS BY MOUTH DAILY 90 tablet 0  . traZODone (DESYREL) 150 MG tablet Take 1 tablet (150 mg total) by mouth at bedtime as needed for sleep. 90 tablet 1  . valACYclovir (VALTREX) 1000 MG tablet TAKE 1 TABLET BY MOUTH TWICE DAILY 10 tablet 0  . diltiazem (CARDIZEM CD) 180 MG 24 hr capsule Take 1 capsule (180 mg total) by mouth daily. 90 capsule 3   No current facility-administered medications on file prior to visit.     No Known Allergies  Past Medical History:  Diagnosis Date  . Arthritis   . CAD (coronary  artery disease)   . 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 Depression   . Ejection fraction   . GERD (gastroesophageal reflux disease)   . Hyperlipidemia   . Hypertension   . PONV (postoperative nausea and vomiting)   . Skin cancer of face 05/2015   basal cell  . Stroke [facility]) 2006   deaf in right ear    Past Surgical History:  Procedure Laterality Date  . BREAST BIOPSY Left 1984   abcess  . CESAREAN SECTION    . CHOLECYSTECTOMY  1990  . CHONDROPLASTY Left 01/05/2015   Procedure: CHONDROPLASTY;  Surgeon: Yvette Rack., MD;  Location: [place];  Service: Orthopedics;   Laterality: Left;  . KNEE ARTHROSCOPY WITH MEDIAL MENISECTOMY Left 01/05/2015   Procedure: LEFT KNEE ARTHROSCOPY WITH MEDIAL AND LATERAL MENISECTOMY; DEBRIDEMENT LATERAL PATELLA FEMORAL ;  Surgeon: Yvette Rack., MD;  Location: [place];  Service: Orthopedics;  Laterality: Left;  . LEFT HEART CATHETERIZATION WITH CORONARY ANGIOGRAM N/A 10/28/2013   Procedure: LEFT HEART CATHETERIZATION WITH CORONARY ANGIOGRAM;  Surgeon: Larey Dresser, MD;  Location: [facility] CATH LAB;  Service: Cardiovascular;  Laterality: N/A;  . TOTAL KNEE ARTHROPLASTY Left 07/06/2015   Procedure: TOTAL KNEE ARTHROPLASTY;  Surgeon: Earlie Server, MD;  Location: [place];  Service: Orthopedics;  Laterality: Left;  . TUBAL LIGATION      Family History  Problem Relation Age of Onset  . Healthy Mother   . Hypertension Mother   . Stroke Mother   . Stroke Father   . Heart failure Father   . Heart disease Father        CABG  . Ovarian cancer Paternal Grandmother   . Liver cancer Paternal Grandfather   . Stroke Maternal Grandmother   . Heart failure Maternal Grandmother   . Heart failure Maternal Grandfather   . Colon cancer Neg Hx   . Stomach cancer Neg Hx   . Rectal cancer Neg Hx   . Esophageal cancer Neg Hx   . Breast cancer Neg Hx     Social History   Socioeconomic History  . Marital status: Significant Other    Spouse name: [name]  . Number of children: 8  . Years of education: Not on file  . Highest education level: Not on file  Occupational History  . 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 Financial resource strain: Not on file  . Food insecurity:    Worry: Not on file    Inability: Not on file  . Transportation needs:    Medical: Not on file    Non-medical: Not on file  Tobacco Use  . Smoking status: Never Smoker  . Smokeless tobacco: Never Used  . Tobacco comment: LIVES WITH 2 SMOKERS   Substance and Sexual Activity  . Alcohol  use: Yes    Comment: 1 glass of wine per month  . Drug use: No  . Sexual activity: Not on file  Lifestyle  . Physical activity:    Days per week: Not on file    Minutes per session: Not on file  . Stress: Not on file  Relationships  . Social connections:    Talks on phone: Not on file    Gets together: Not on file  Attends religious service: Not on file    Active member of club or organization: Not on file    Attends meetings of clubs or organizations: Not on file    Relationship status: Not on file  . Intimate partner violence:    Fear of current or ex partner: Not on file    Emotionally abused: Not on file    Physically abused: Not on file    Forced sexual activity: Not on file  Other Topics Concern  . Not on file  Social History Narrative   Divorced, but has fiancee.  Lives with fiancee in [place].  Moved here recently from [place], [place].     She 8 children- ages 72- 61.               The PMH, PSH, Social History, Family History, Medications, and allergies have been reviewed in [facility], and have been updated if relevant.   Review of Systems  Constitutional: Negative.   Gastrointestinal: Positive for abdominal pain. Negative for abdominal distention, anal bleeding, blood in stool, constipation, diarrhea, nausea, rectal pain and vomiting.  Psychiatric/Behavioral: Negative for agitation, behavioral problems, confusion, decreased concentration, dysphoric mood, hallucinations, self-injury, sleep disturbance and suicidal ideas. The patient is nervous/anxious. The patient is not hyperactive.   All other systems reviewed and are negative.      Objective:    BP 124/76 (BP Location: Left Arm, Cuff Size: Large)   Pulse 78   Temp 98.6 F (37 C) (Oral)   Wt 200 lb (90.7 kg)   SpO2 98%   BMI 36.58 kg/m    Physical Exam  Constitutional: She is oriented to person, place, and time. She appears well-developed and well-nourished. No distress.  HENT:  Head: Normocephalic  and atraumatic.  Eyes: EOM are normal.  Cardiovascular: Normal rate and regular rhythm.  Musculoskeletal: Normal range of motion.  Neurological: She is alert and oriented to person, place, and time. No cranial nerve deficit.  Skin: Skin is warm and dry. She is not diaphoretic.  Psychiatric: She has a normal mood and affect. Her behavior is normal. Judgment and thought content normal.  Nursing note and vitals reviewed.         Assessment & Plan:   Essential hypertension - Plan: TSH, CBC w/Diff, Comp Met (CMET), Lipid Profile, ALT  Hyperlipidemia LDL goal <70 - Plan: TSH, CBC w/Diff, Comp Met (CMET), Lipid Profile  Vitamin D deficiency - Plan: VITAMIN D 25 Hydroxy (Vit-D Deficiency, Fractures)  Dyslipidemia - Plan: ALT  Chronic diastolic heart failure ([place]) - Plan: ALT  Medication management - Plan: ALT No follow-ups on file.

## 2018-09-09 ENCOUNTER — Telehealth: Payer: Self-pay | Admitting: *Deleted

## 2018-09-09 LAB — ALT: ALT: 23 IU/L (ref 0–32)

## 2018-09-09 NOTE — Telephone Encounter (Signed)
-----   Message from Nelva Bush, MD sent at 09/09/2018  7:21 AM EDT ----- Labs obtained by PCP, including CMP and lipid panel, are acceptable with LDL at goal.  Triglycerides are minimally above normal.  I recommend continuing current cardiac medications.  I will defer further management to Dr. Deborra Medina.

## 2018-09-09 NOTE — Telephone Encounter (Signed)
No answer. Left message to call back.   

## 2018-09-14 ENCOUNTER — Ambulatory Visit: Payer: Medicare HMO | Admitting: Licensed Clinical Social Worker

## 2018-09-14 ENCOUNTER — Telehealth: Payer: Self-pay | Admitting: *Deleted

## 2018-09-14 ENCOUNTER — Ambulatory Visit (INDEPENDENT_AMBULATORY_CARE_PROVIDER_SITE_OTHER): Payer: Medicare HMO | Admitting: Licensed Clinical Social Worker

## 2018-09-14 DIAGNOSIS — F3341 Major depressive disorder, recurrent, in partial remission: Secondary | ICD-10-CM

## 2018-09-14 NOTE — Telephone Encounter (Signed)
-----   Message from Nelva Bush, MD sent at 09/09/2018  7:21 AM EDT ----- Labs obtained by PCP, including CMP and lipid panel, are acceptable with LDL at goal.  Triglycerides are minimally above normal.  I recommend continuing current cardiac medications.  I will defer further management to Dr. Deborra Medina.

## 2018-09-14 NOTE — Telephone Encounter (Signed)
No answer. Left message to call back.   

## 2018-09-19 IMAGING — US US THYROID
1 series · 13 of 25 positions shown · non-contrast
Comparison: 04/03/2017

CLINICAL DATA: Right inferior thyroid nodule by CTA

EXAM:
THYROID ULTRASOUND
TECHNIQUE: Ultrasound examination of the thyroid gland and adjacent soft
tissues was performed.

[Series 1: us thyroid · 0.08mm/px · 13 of 53 slices shown]
[im 1/53]
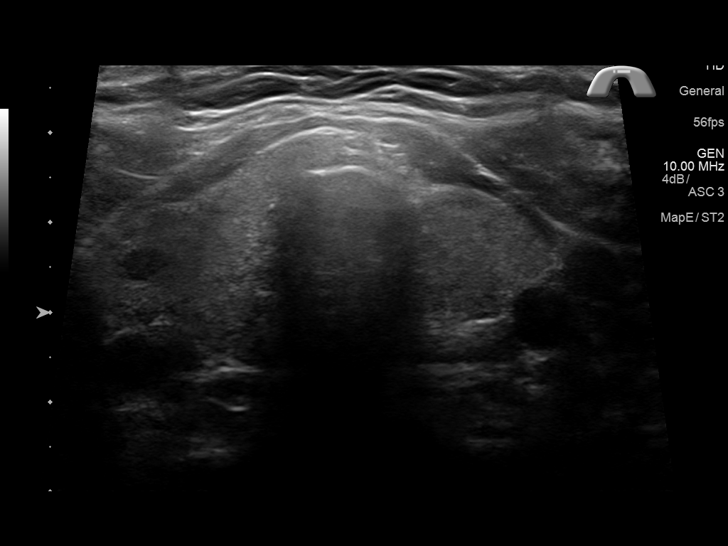
[im 5/53]
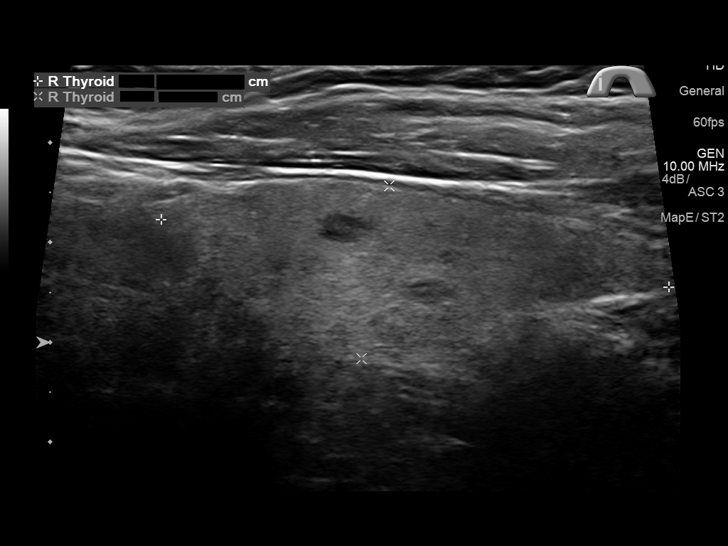
[im 9/53]
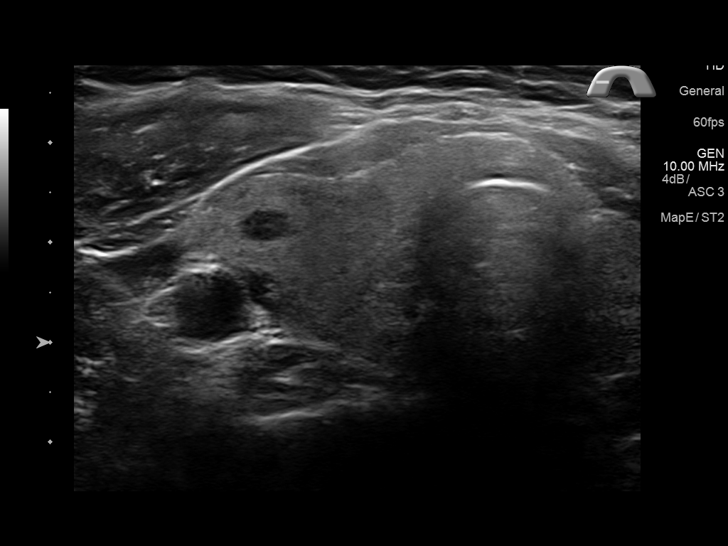
[im 14/53]
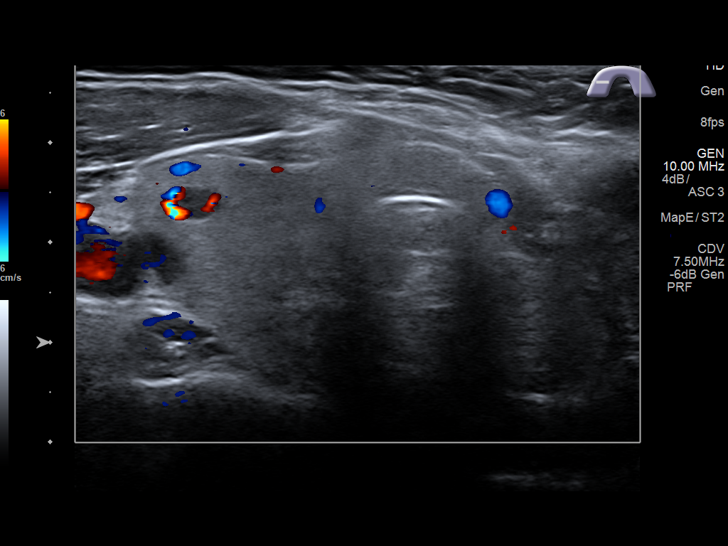
[im 18/53]
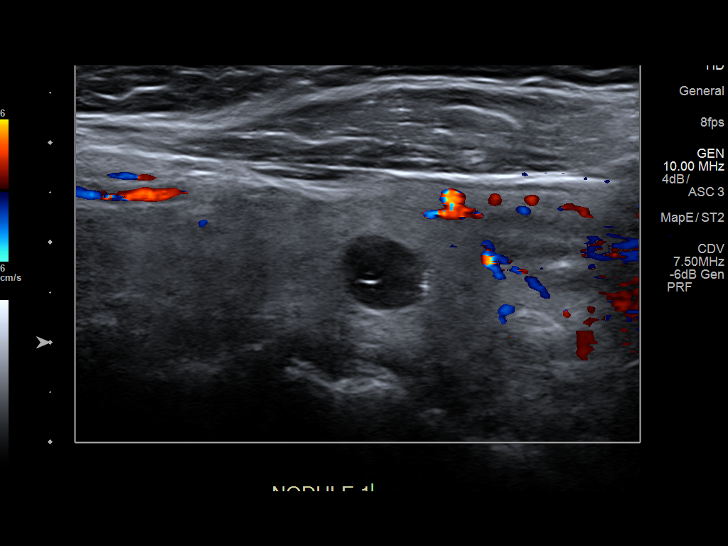
[im 22/53]
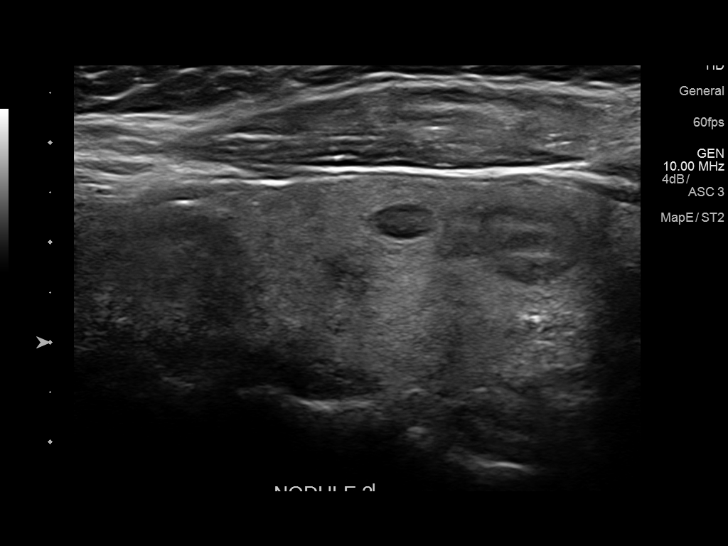
[im 27/53]
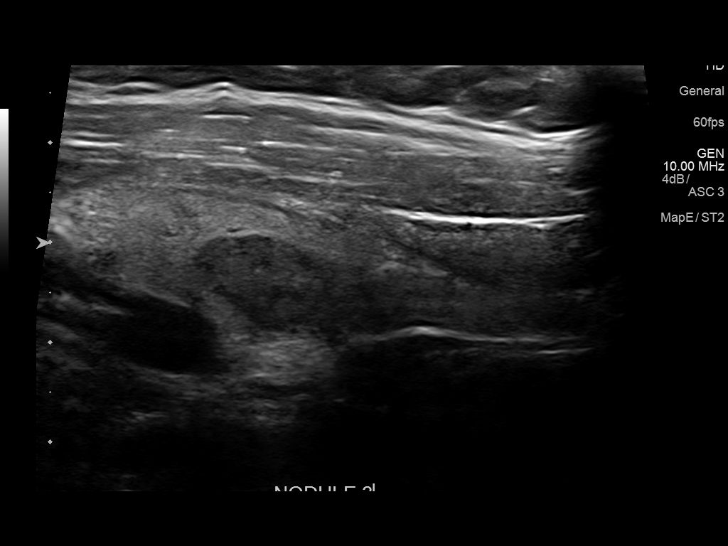
[im 31/53]
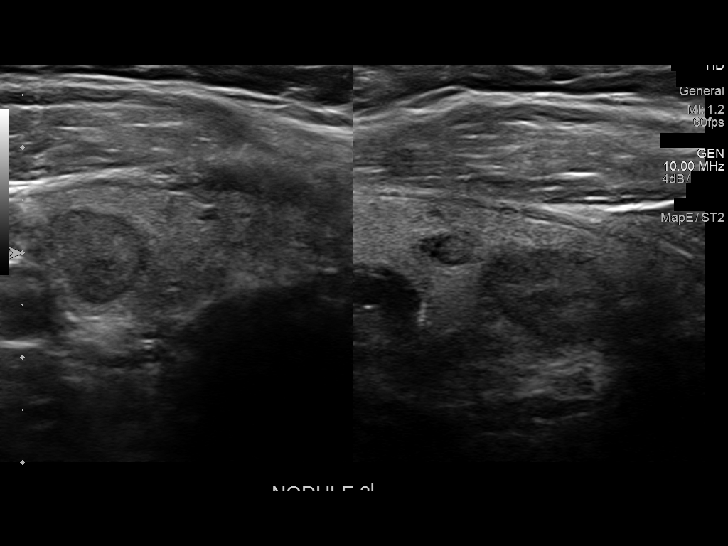
[im 35/53]
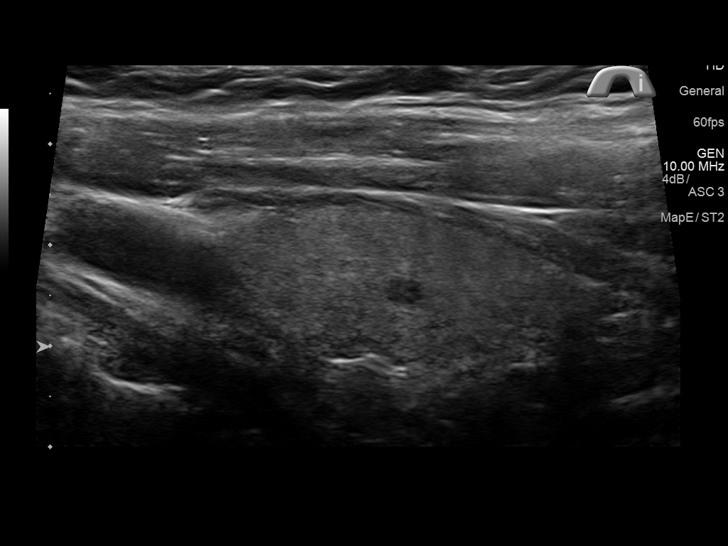
[im 40/53]
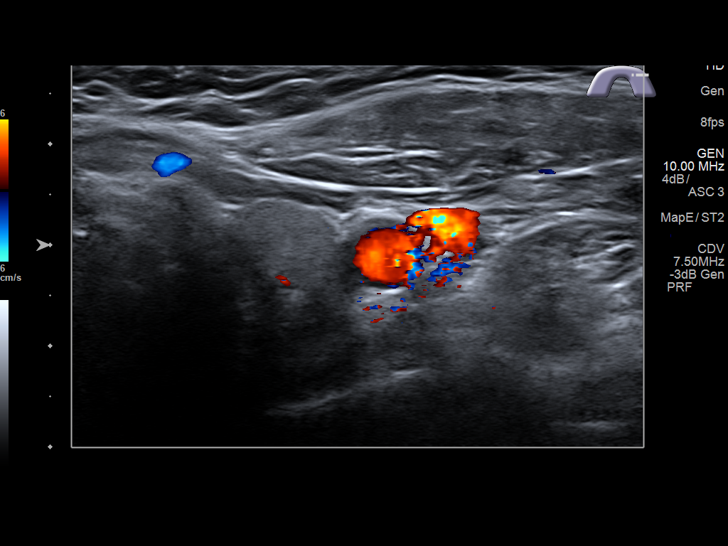
[im 44/53]
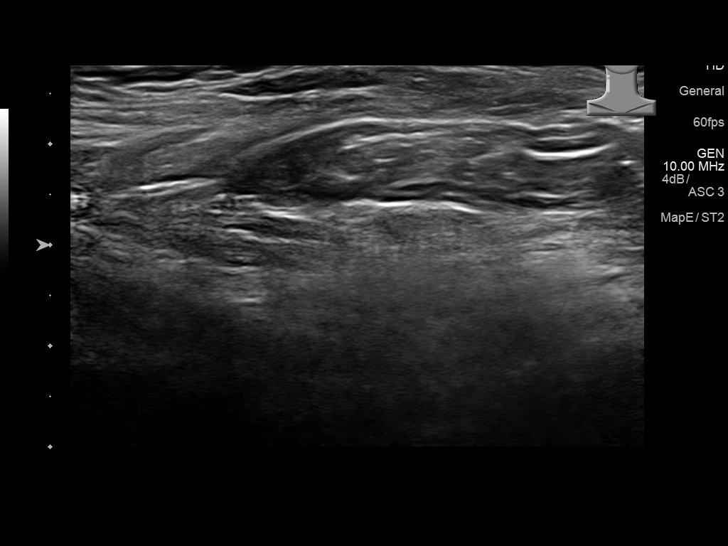
[im 48/53]
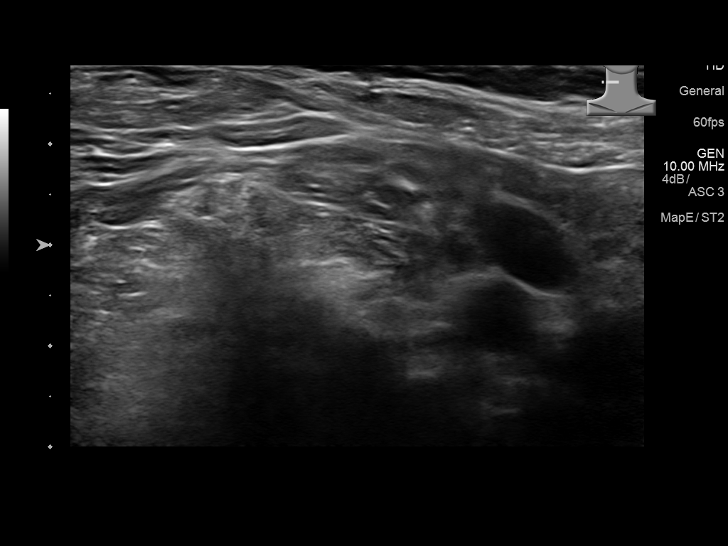
[im 53/53]
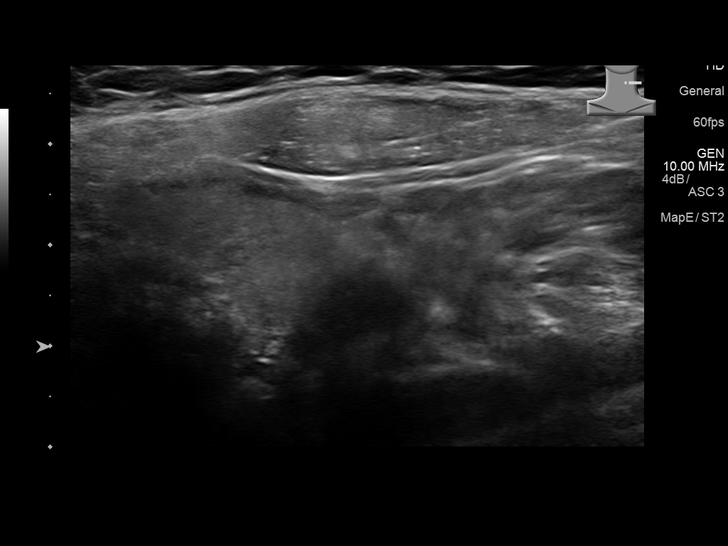

[13 of 25 positions shown; findings below may reference images not displayed]

FINDINGS: Parenchymal Echotexture: Mildly heterogenous

Isthmus:

Right lobe: 5.1 x 1.8 x 2.2 cm

Left lobe: 3.6 x 1.5 x 1.6 cm

_________________________________________________________

Estimated total number of nodules >/= 1 cm: 1

Number of spongiform nodules >/=  2 cm not described below (TR1): 0

Number of mixed cystic and solid nodules >/= 1.5 cm not described
below (TR2): 0

_________________________________________________________

Nodule # 1:

Location: Right; Inferior

Maximum size: 1.3 cm; Other 2 dimensions: 0.9 x 1.0 cm

Composition: solid/almost completely solid (2)

Echogenicity: hypoechoic (2)

Shape: not taller-than-wide (0)

Margins: smooth (0)

Echogenic foci: none (0)

ACR TI-RADS total points: 4.

ACR TI-RADS risk category: TR4 (4-6 points).

ACR TI-RADS recommendations:

*Given size (>/= 1 - 1.4 cm) and appearance, a follow-up ultrasound
in 1 year should be considered based on TI-RADS criteria.

_________________________________________________________

There are additional small subcentimeter bilateral hypoechoic solid
and cystic nodules which all measure 9 mm or less in size and do not
meet criteria for additional imaging follow-up or biopsy.

No visualized surrounding adenopathy.
IMPRESSION: 1.3 cm right inferior solid hypoechoic TR 4 nodule as above. This
nodule meets criteria for follow-up at 1 year and correlates with
the CT finding.

The above is in keeping with the ACR TI-RADS recommendations - [HOSPITAL] 1493;[DATE].

## 2018-09-28 NOTE — Progress Notes (Signed)
Subjective:   Diane [name] is a 67 y.o. female who presents for an Initial Medicare Annual Wellness Visit.  Review of Systems   No ROS.  Medicare Wellness Visit. Additional risk factors are reflected in the social history. Cardiac Risk Factors include: advanced age (>32men, >81 women);dyslipidemia;hypertension;obesity (BMI >30kg/m2) Sleep patterns: Takes Trazodone and Zoloft at bedtime. Sleeps well Home Safety/Smoke Alarms: Feels safe in home. Smoke alarms in place. Lives in 1 story with basement. Boyfriend lives with her.  Female:     Mammo- utd Dexa scan- utd       CCS- next due 01/2020     Objective:    Today's Vitals   09/29/18 1255  BP: 132/88  Pulse: 62  SpO2: 97%  Weight: 195 lb (88.5 kg)  Height: 5' 2.5" (1.588 m)   Body mass index is 35.1 kg/m.  Advanced Directives 09/29/2018 03/27/2017 07/06/2015 06/25/2015 02/23/2015 01/05/2015 01/02/2015  Does Patient Have a Medical Advance Directive? No No No No No No No  Would patient like information on creating a medical advance directive? Yes (MAU/Ambulatory/Procedural Areas - Information given) No - Patient declined Yes - Educational materials given Yes - Educational materials given No - patient declined information No - patient declined information No - patient declined information  Pre-existing out of facility DNR order (yellow form or pink MOST form) - - - - - - -    Current Medications (verified) Outpatient Encounter Medications as of 09/29/2018  Medication Sig  . aspirin EC 81 MG tablet Take 1 tablet (81 mg total) by mouth daily.  [name] atorvastatin (LIPITOR) 80 MG tablet TAKE 1 TABLET BY MOUTH DAILY  . benazepril-hydrochlorthiazide (LOTENSIN HCT) 20-12.5 MG tablet Take 1 tablet by mouth daily.  . budesonide-formoterol (SYMBICORT) 80-4.5 MCG/ACT inhaler Inhale 2 puffs into the lungs 2 (two) times daily.  . butalbital-acetaminophen-caffeine (FIORICET WITH CODEINE) 50-325-40-30 MG capsule TAKE 1-2 CAPSULES BY MOUTH DAILY AS  NEEDED FOR HEADACHE  . cholecalciferol (VITAMIN D) 1000 UNITS tablet Take 1,000 Units by mouth daily.  . diclofenac sodium (VOLTAREN) 1 % GEL Apply 2 g 4 (four) times daily topically.  [name] diltiazem (CARDIZEM CD) 180 MG 24 hr capsule Take 1 capsule (180 mg total) by mouth daily.  . furosemide (LASIX) 20 MG tablet Take 1 tablet (20 mg total) by mouth daily.  [name] PROAIR HFA 108 (90 Base) MCG/ACT inhaler INHALE 1 TO 2 PUFFS INTO THE LUNGS EVERY 6 HOURS AS NEEDED FOR WHEEZING OR SHORTNESS OF BREATH  . ranolazine (RANEXA) 500 MG 12 hr tablet TAKE 1 TABLET(500 MG) BY MOUTH TWICE DAILY  . sertraline (ZOLOFT) 100 MG tablet Take 1.5 tablets (150 mg total) by mouth daily.  . traZODone (DESYREL) 150 MG tablet Take 1 tablet (150 mg total) by mouth at bedtime as needed for sleep.  . valACYclovir (VALTREX) 1000 MG tablet TAKE 1 TABLET BY MOUTH TWICE DAILY  . [DISCONTINUED] cyclobenzaprine (FLEXERIL) 10 MG tablet Take 1 tablet (10 mg total) 3 (three) times daily as needed by mouth for muscle spasms.  . [DISCONTINUED] diazepam (VALIUM) 5 MG tablet Take 1 tablet (5 mg total) by mouth every 12 (twelve) hours as needed for anxiety.   No facility-administered encounter medications on file as of 09/29/2018.     Allergies (verified) Fosamax [alendronate sodium]   History: Past Medical History:  Diagnosis Date  . Arthritis   . CAD (coronary artery disease)   . 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 Depression   . Ejection fraction   . GERD (gastroesophageal reflux disease)   . Hyperlipidemia   . Hypertension   . PONV (postoperative nausea and vomiting)   . Skin cancer of face 05/2015   basal cell  . Stroke [facility]) 2006   deaf in right ear   Past Surgical History:  Procedure Laterality Date  . BREAST BIOPSY Left 1984   abcess  . CESAREAN SECTION    . CHOLECYSTECTOMY  1990  . CHONDROPLASTY Left 01/05/2015   Procedure:  CHONDROPLASTY;  Surgeon: Yvette Rack., MD;  Location: [place];  Service: Orthopedics;  Laterality: Left;  . KNEE ARTHROSCOPY WITH MEDIAL MENISECTOMY Left 01/05/2015   Procedure: LEFT KNEE ARTHROSCOPY WITH MEDIAL AND LATERAL MENISECTOMY; DEBRIDEMENT LATERAL PATELLA FEMORAL ;  Surgeon: Yvette Rack., MD;  Location: [place];  Service: Orthopedics;  Laterality: Left;  . LEFT HEART CATHETERIZATION WITH CORONARY ANGIOGRAM N/A 10/28/2013   Procedure: LEFT HEART CATHETERIZATION WITH CORONARY ANGIOGRAM;  Surgeon: Larey Dresser, MD;  Location: [facility] CATH LAB;  Service: Cardiovascular;  Laterality: N/A;  . TOTAL KNEE ARTHROPLASTY Left 07/06/2015   Procedure: TOTAL KNEE ARTHROPLASTY;  Surgeon: Earlie Server, MD;  Location: [place];  Service: Orthopedics;  Laterality: Left;  . TUBAL LIGATION     Family History  Problem Relation Age of Onset  . Healthy Mother   . Hypertension Mother   . Stroke Mother   . Stroke Father   . Heart failure Father   . Heart disease Father        CABG  . Ovarian cancer Paternal Grandmother   . Liver cancer Paternal Grandfather   . Stroke Maternal Grandmother   . Heart failure Maternal Grandmother   . Heart failure Maternal Grandfather   . Colon cancer Neg Hx   . Stomach cancer Neg Hx   . Rectal cancer Neg Hx   . Esophageal cancer Neg Hx   . Breast cancer Neg Hx    Social History   Socioeconomic History  . Marital status: Significant Other    Spouse name: [name]  . Number of children: 8  . Years of education: Not on file  . Highest education level: Not on file  Occupational History  . 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 Financial resource strain: Not on file  . Food insecurity:    Worry: Not on file    Inability: Not on file  . Transportation needs:    Medical: Not on file    Non-medical: Not on file  Tobacco Use  . Smoking status: Never Smoker  . Smokeless  tobacco: Never Used  . Tobacco comment: LIVES WITH 2 SMOKERS   Substance and Sexual Activity  . Alcohol use: Yes    Comment: 1 glass of wine per month  . Drug use: No  . Sexual activity: Yes    Birth control/protection: Post-menopausal  Lifestyle  . Physical activity:    Days per week: Not on file    Minutes per session: Not on file  . Stress: Not on file  Relationships  . Social connections:    Talks on phone: Not on file    Gets together: Not on file    Attends religious service: Not on file    Active member of club or organization: Not on file  Attends meetings of clubs or organizations: Not on file    Relationship status: Not on file  Other Topics Concern  . Not on file  Social History Narrative   Divorced, but has fiancee.  Lives with fiancee in [place].  Moved here recently from [place], [place].     She 8 children- ages 51- 41.                Tobacco Counseling Counseling given: Not Answered Comment: LIVES WITH 2 SMOKERS    Clinical Intake:  Pain : No/denies pain    Activities of Daily Living In your present state of health, do you have any difficulty performing the following activities: 09/29/2018 12/31/2017  Hearing? Y N  Comment totally deaf in right ear since 13 -  Vision? N N  Difficulty concentrating or making decisions? N N  Walking or climbing stairs? N N  Dressing or bathing? N N  Doing errands, shopping? N N  Preparing Food and eating ? N -  Using the Toilet? N -  In the past six months, have you accidently leaked urine? N -  Do you have problems with loss of bowel control? N -  Managing your Medications? N -  Managing your Finances? N -  Housekeeping or managing your Housekeeping? N -  Some recent data might be hidden     Immunizations and Health Maintenance Immunization History  Administered Date(s) Administered  . Influenza,inj,Quad PF,6+ Mos 02/27/2015, 09/08/2018  . Influenza-Unspecified 10/04/2015, 11/13/2016  . Pneumococcal  Conjugate-13 12/31/2017  . Tdap 07/24/2014  . Zoster Recombinat (Shingrix) 04/21/2018, 08/05/2018   There are no preventive care reminders to display for this patient.  Patient Care Team: Lucille Passy, MD as PCP - General (Family Medicine)  Indicate any recent Medical Services you may have received from other than [facility] providers in the past year (date may be approximate).     Assessment:   This is a routine wellness examination for [place]. Physical assessment deferred to PCP.  Hearing/Vision screen Hearing Screening Comments: Pt deaf in right ear since 1994 Vision Screening Comments: utd on eye exam per pt  Dietary issues and exercise activities discussed: Current Exercise Habits: Structured exercise class, Time (Minutes): 45, Frequency (Times/Week): 3, Weekly Exercise (Minutes/Week): 135, Exercise limited by: None identified Diet (meal preparation, eat out, water intake, caffeinated beverages, dairy products, fruits and vegetables): in general, a "healthy" diet    Goals    . Exercise 150 min/wk Moderate Activity      Depression Screen PHQ 2/9 Scores 09/29/2018 08/23/2018 07/08/2018 05/04/2018 11/12/2017 11/04/2017 11/01/2015  PHQ - 2 Score 0 4 2 6  0 6 0  PHQ- 9 Score - 20 15 21  - 22 -    Fall Risk Fall Risk  09/29/2018 11/12/2017 11/04/2017  Falls in the past year? Yes No No  Number falls in past yr: 1 - -  Injury with Fall? No - -  Follow up Education provided;Falls prevention discussed - -    Cognitive Function: Ad8 score reviewed for issues:  Issues making decisions:no  Less interest in hobbies / activities:no  Repeats questions, stories (family complaining):no  Trouble using ordinary gadgets (microwave, computer, phone):no  Forgets the month or year: no  Mismanaging finances: no  Remembering appts:no  Daily problems with thinking and/or memory:no Ad8 score is=0       Screening Tests Health Maintenance  Topic Date Due  . PNA vac Low Risk Adult (2 of 2 -  PPSV23) 12/31/2018  . MAMMOGRAM  08/25/2019  . COLONOSCOPY  02/06/2020  . TETANUS/TDAP  07/24/2024  . INFLUENZA VACCINE  Completed  . DEXA SCAN  Completed  . Hepatitis C Screening  Completed     Plan:    Please schedule your next medicare wellness visit with me in 1 yr.  Continue to eat heart healthy diet (full of fruits, vegetables, whole grains, lean protein, water--limit salt, fat, and sugar intake) and increase physical activity as tolerated.  Continue doing brain stimulating activities (puzzles, reading, adult coloring books, staying active) to keep memory sharp.   Bring a copy of your living will and/or healthcare power of attorney to your next office visit.    I have personally reviewed and noted the following in the patient's chart:   . Medical and social history . Use of alcohol, tobacco or illicit drugs  . Current medications and supplements . Functional ability and status . Nutritional status . Physical activity . Advanced directives . List of other physicians . Hospitalizations, surgeries, and ER visits in previous 12 months . Vitals . Screenings to include cognitive, depression, and falls . Referrals and appointments  In addition, I have reviewed and discussed with patient certain preventive protocols, quality metrics, and best practice recommendations. A written personalized care plan for preventive services as well as general preventive health recommendations were provided to patient.     Shela Nevin, [place]   09/29/2018

## 2018-09-29 ENCOUNTER — Encounter: Payer: Self-pay | Admitting: Family Medicine

## 2018-09-29 ENCOUNTER — Ambulatory Visit (INDEPENDENT_AMBULATORY_CARE_PROVIDER_SITE_OTHER): Payer: Medicare HMO | Admitting: Family Medicine

## 2018-09-29 ENCOUNTER — Encounter: Payer: Self-pay | Admitting: Behavioral Health

## 2018-09-29 ENCOUNTER — Ambulatory Visit: Payer: Medicare HMO | Admitting: Behavioral Health

## 2018-09-29 VITALS — BP 132/88 | HR 62 | Temp 98.9°F | Ht 62.5 in | Wt 195.8 lb

## 2018-09-29 VITALS — BP 132/88 | HR 62 | Ht 62.5 in | Wt 195.0 lb

## 2018-09-29 DIAGNOSIS — F411 Generalized anxiety disorder: Secondary | ICD-10-CM | POA: Diagnosis not present

## 2018-09-29 DIAGNOSIS — Z0000 Encounter for general adult medical examination without abnormal findings: Secondary | ICD-10-CM

## 2018-09-29 MED ORDER — DIAZEPAM 5 MG PO TABS: 5.0000 mg | ORAL_TABLET | Freq: Three times a day (TID) | ORAL | 1 refills | Status: DC | PRN

## 2018-09-29 NOTE — Progress Notes (Signed)
Subjective:   Patient ID: Diane [name], female    DOB: 1951-10-25, 67 y.o.   MRN: [id number]  Diane [name] is a pleasant 67 y.o. year old female who presents to clinic today with Follow-up (Patient is here today for a follow-up.  She sees the Wellness Coach at 1pm today.  Her last labs were completed on 9.11.19.  Mammogram has been ordered.  She is completely out of the Valium.  The shakes are back.  She feels like the dosage was a little low too fast.  But she has been breathing and had only taken it as absolutely had to.  )  on 09/29/2018  HPI:  GAD- Last saw her on 09/08/18 for this complaint. Note reviewed- prior to that OV, had started seeing a therapist, Gregary Signs weekly.   She gave her some goals and tools- deep breathing defining short term goals, every day, don't go straight to taking a xanax.  Gregary Signs agreed with patient that xanax isn't strong enough but wanted her to not go straight to taking it when she felt anxious.  After taking two xanax in the therapist's office, she was still shaking.  Valium worked to help with anxiety and shaking.  We increased her zoloft to 150 mg daily, d;c/d xanax and gave her rx for valium 5 mg to take twice daily as needed for severe anxiety.  Today she is update because she feels the "dosage was a little low too fast."  She has been more anxious and having tremors.  She feels depression is "almost completely gone.  I'm me again." Current Outpatient Medications on File Prior to Visit  Medication Sig Dispense Refill  . aspirin EC 81 MG tablet Take 1 tablet (81 mg total) by mouth daily. 90 tablet 3  . atorvastatin (LIPITOR) 80 MG tablet TAKE 1 TABLET BY MOUTH DAILY 90 tablet 0  . benazepril-hydrochlorthiazide (LOTENSIN HCT) 20-12.5 MG tablet Take 1 tablet by mouth daily. 30 tablet 3  . budesonide-formoterol (SYMBICORT) 80-4.5 MCG/ACT inhaler Inhale 2 puffs into the lungs 2 (two) times daily. 1 Inhaler 12  . butalbital-acetaminophen-caffeine (FIORICET  WITH CODEINE) 50-325-40-30 MG capsule TAKE 1-2 CAPSULES BY MOUTH DAILY AS NEEDED FOR HEADACHE 30 capsule 2  . cholecalciferol (VITAMIN D) 1000 UNITS tablet Take 1,000 Units by mouth daily.    . diazepam (VALIUM) 5 MG tablet Take 1 tablet (5 mg total) by mouth every 12 (twelve) hours as needed for anxiety. 30 tablet 1  . diclofenac sodium (VOLTAREN) 1 % GEL Apply 2 g 4 (four) times daily topically. 100 g 1  . furosemide (LASIX) 20 MG tablet Take 1 tablet (20 mg total) by mouth daily. 90 tablet 3  . PROAIR HFA 108 (90 Base) MCG/ACT inhaler INHALE 1 TO 2 PUFFS INTO THE LUNGS EVERY 6 HOURS AS NEEDED FOR WHEEZING OR SHORTNESS OF BREATH 8.5 g 1  . ranolazine (RANEXA) 500 MG 12 hr tablet TAKE 1 TABLET(500 MG) BY MOUTH TWICE DAILY 180 tablet 0  . sertraline (ZOLOFT) 100 MG tablet Take 1.5 tablets (150 mg total) by mouth daily. 90 tablet 0  . traZODone (DESYREL) 150 MG tablet Take 1 tablet (150 mg total) by mouth at bedtime as needed for sleep. 90 tablet 1  . valACYclovir (VALTREX) 1000 MG tablet TAKE 1 TABLET BY MOUTH TWICE DAILY 10 tablet 0  . diltiazem (CARDIZEM CD) 180 MG 24 hr capsule Take 1 capsule (180 mg total) by mouth daily. 90 capsule 3   No current facility-administered  medications on file prior to visit.     Allergies  Allergen Reactions  . Fosamax [Alendronate Sodium] Other (See Comments)    GERD and joint pain    Past Medical History:  Diagnosis Date  . Arthritis   . CAD (coronary artery disease)   . 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 Depression   . Ejection fraction   . GERD (gastroesophageal reflux disease)   . Hyperlipidemia   . Hypertension   . PONV (postoperative nausea and vomiting)   . Skin cancer of face 05/2015   basal cell  . Stroke [facility]) 2006   deaf in right ear    Past Surgical History:  Procedure Laterality Date  . BREAST BIOPSY Left 1984   abcess  . CESAREAN SECTION    .  CHOLECYSTECTOMY  1990  . CHONDROPLASTY Left 01/05/2015   Procedure: CHONDROPLASTY;  Surgeon: Yvette Rack., MD;  Location: [place];  Service: Orthopedics;  Laterality: Left;  . KNEE ARTHROSCOPY WITH MEDIAL MENISECTOMY Left 01/05/2015   Procedure: LEFT KNEE ARTHROSCOPY WITH MEDIAL AND LATERAL MENISECTOMY; DEBRIDEMENT LATERAL PATELLA FEMORAL ;  Surgeon: Yvette Rack., MD;  Location: [place];  Service: Orthopedics;  Laterality: Left;  . LEFT HEART CATHETERIZATION WITH CORONARY ANGIOGRAM N/A 10/28/2013   Procedure: LEFT HEART CATHETERIZATION WITH CORONARY ANGIOGRAM;  Surgeon: Larey Dresser, MD;  Location[facility] [place] [facility];  Service: Cardiovascular;  Laterality: N/A;  . TOTAL KNEE ARTHROPLASTY Left 07/06/2015   Procedure: TOTAL KNEE ARTHROPLASTY;  Surgeon: Earlie Server, MD;  Location: [place];  Service: Orthopedics;  Laterality: Left;  . TUBAL LIGATION      Family History  Problem Relation Age of Onset  . Healthy Mother   . Hypertension Mother   . Stroke Mother   . Stroke Father   . Heart failure Father   . Heart disease Father        CABG  . Ovarian cancer Paternal Grandmother   . Liver cancer Paternal Grandfather   . Stroke Maternal Grandmother   . Heart failure Maternal Grandmother   . Heart failure Maternal Grandfather   . Colon cancer Neg Hx   . Stomach cancer Neg Hx   . Rectal cancer Neg Hx   . Esophageal cancer Neg Hx   . Breast cancer Neg Hx     Social History   Socioeconomic History  . Marital status: Significant Other    Spouse name: [name]  . Number of children: 8  . Years of education: Not on file  . Highest education level: Not on file  Occupational History  . 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 Financial resource strain: Not on file  . Food insecurity:    Worry: Not on file    Inability: Not on file  . Transportation needs:    Medical: Not on file    Non-medical: Not  on file  Tobacco Use  . Smoking status: Never Smoker  . Smokeless tobacco: Never Used  . Tobacco comment: LIVES WITH 2 SMOKERS   Substance and Sexual Activity  . Alcohol use: Yes    Comment: 1 glass of wine per month  . Drug use: No  . Sexual activity: Not on file  Lifestyle  . Physical activity:    Days  per week: Not on file    Minutes per session: Not on file  . Stress: Not on file  Relationships  . Social connections:    Talks on phone: Not on file    Gets together: Not on file    Attends religious service: Not on file    Active member of club or organization: Not on file    Attends meetings of clubs or organizations: Not on file    Relationship status: Not on file  . Intimate partner violence:    Fear of current or ex partner: Not on file    Emotionally abused: Not on file    Physically abused: Not on file    Forced sexual activity: Not on file  Other Topics Concern  . Not on file  Social History Narrative   Divorced, but has fiancee.  Lives with fiancee in [place].  Moved here recently from [place], [place].     She 8 children- ages 14- 34.               The PMH, PSH, Social History, Family History, Medications, and allergies have been reviewed in [facility], and have been updated if relevant.   Review of Systems  Psychiatric/Behavioral: Negative for agitation, behavioral problems, confusion, decreased concentration, dysphoric mood, hallucinations, self-injury, sleep disturbance and suicidal ideas. The patient is nervous/anxious. The patient is not hyperactive.   All other systems reviewed and are negative.      Objective:    BP 132/88 (BP Location: Left Arm, Patient Position: Sitting, Cuff Size: Normal)   Pulse 62   Temp 98.9 F (37.2 C) (Oral)   Ht 5' 2.5" (1.588 m)   Wt 195 lb 12.8 oz (88.8 kg)   SpO2 97%   BMI 35.24 kg/m    Physical Exam  General:  Well-developed,well-nourished,in no acute distress; alert,appropriate and cooperative throughout  examination Head:  normocephalic and atraumatic.   Eyes:  vision grossly intact, PERRL Ears:  R ear normal and L ear normal externally, TMs clear bilaterally Nose:  no external deformity.   Mouth:  good dentition.   Neck:  No deformities, masses, or tenderness noted. Lungs:  Normal respiratory effort, chest expands symmetrically. Lungs are clear to auscultation, no crackles or wheezes. Heart:  Normal rate and regular rhythm. S1 and S2 normal without gallop, murmur, click, rub or other extra sounds. Msk:  No deformity or scoliosis noted of thoracic or lumbar spine.   Extremities:  No clubbing, cyanosis, edema, or deformity noted with normal full range of motion of all joints.   Neurologic:  alert & oriented X3 and gait normal.   Skin:  Intact without suspicious lesions or rashes Psych:  Cognition and judgment appear intact. Alert and cooperative with normal attention span and concentration. No apparent delusions, illusions, hallucinations        Assessment & Plan:   Generalized anxiety disorder No follow-ups on file.

## 2018-09-29 NOTE — Telephone Encounter (Signed)
No answer. Left detail message with results and recommendations, ok per DPR, and to call back if any questions.

## 2018-09-29 NOTE — Assessment & Plan Note (Addendum)
>  25 minutes spent in face to face time with patient, >50% spent in counselling or coordination of care discussing anxiety and depression. Deteriorated yet depression has improved dramatically. Continue zoloft 150 mg daily, increase valium to 5 mg three times daily as needed- still advised to try breathing and other techniques before taking the valium. She is seeing her therapist later today.

## 2018-09-29 NOTE — Patient Instructions (Signed)
Great to see you. It is okay to take valium 5 mg up to 3 times daily but still try breathing and other techniques first.

## 2018-09-29 NOTE — Patient Instructions (Signed)
Please schedule your next medicare wellness visit with me in 1 yr.  Continue to eat heart healthy diet (full of fruits, vegetables, whole grains, lean protein, water--limit salt, fat, and sugar intake) and increase physical activity as tolerated.  Continue doing brain stimulating activities (puzzles, reading, adult coloring books, staying active) to keep memory sharp.   Bring a copy of your living will and/or healthcare power of attorney to your next office visit.   Diane [name] , Thank you for taking time to come for your Medicare Wellness Visit. I appreciate your ongoing commitment to your health goals. Please review the following plan we discussed and let me know if I can assist you in the future.   These are the goals we discussed: Goals    . Exercise 150 min/wk Moderate Activity       This is a list of the screening recommended for you and due dates:  Health Maintenance  Topic Date Due  . Pneumonia vaccines (2 of 2 - PPSV23) 12/31/2018  . Mammogram  08/25/2019  . Colon Cancer Screening  02/06/2020  . Tetanus Vaccine  07/24/2024  . Flu Shot  Completed  . DEXA scan (bone density measurement)  Completed  .  Hepatitis C: One time screening is recommended by Center for Disease Control  (CDC) for  adults born from [birth year] through 1965.   Completed     Health Maintenance for Postmenopausal Women Menopause is a normal process in which your reproductive ability comes to an end. This process happens gradually over a span of months to years, usually between the ages of 1 and 71. Menopause is complete when you have missed 12 consecutive menstrual periods. It is important to talk with your health care provider about some of the most common conditions that affect postmenopausal women, such as heart disease, cancer, and bone loss (osteoporosis). Adopting a healthy lifestyle and getting preventive care can help to promote your health and wellness. Those actions can also lower your chances of  developing some of these common conditions. What should I know about menopause? During menopause, you may experience a number of symptoms, such as:  Moderate-to-severe hot flashes.  Night sweats.  Decrease in sex drive.  Mood swings.  Headaches.  Tiredness.  Irritability.  Memory problems.  Insomnia.  Choosing to treat or not to treat menopausal changes is an individual decision that you make with your health care provider. What should I know about hormone replacement therapy and supplements? Hormone therapy products are effective for treating symptoms that are associated with menopause, such as hot flashes and night sweats. Hormone replacement carries certain risks, especially as you become older. If you are thinking about using estrogen or estrogen with progestin treatments, discuss the benefits and risks with your health care provider. What should I know about heart disease and stroke? Heart disease, heart attack, and stroke become more likely as you age. This may be due, in part, to the hormonal changes that your body experiences during menopause. These can affect how your body processes dietary fats, triglycerides, and cholesterol. Heart attack and stroke are both medical emergencies. There are many things that you can do to help prevent heart disease and stroke:  Have your blood pressure checked at least every 1-2 years. High blood pressure causes heart disease and increases the risk of stroke.  If you are 48-47 years old, ask your health care provider if you should take aspirin to prevent a heart attack or a stroke.  Do not use  any tobacco products, including cigarettes, chewing tobacco, or electronic cigarettes. If you need help quitting, ask your health care provider.  It is important to eat a healthy diet and maintain a healthy weight. ? Be sure to include plenty of vegetables, fruits, low-fat dairy products, and lean protein. ? Avoid eating foods that are high in solid  fats, added sugars, or salt (sodium).  Get regular exercise. This is one of the most important things that you can do for your health. ? Try to exercise for at least 150 minutes each week. The type of exercise that you do should increase your heart rate and make you sweat. This is known as moderate-intensity exercise. ? Try to do strengthening exercises at least twice each week. Do these in addition to the moderate-intensity exercise.  Know your numbers.Ask your health care provider to check your cholesterol and your blood glucose. Continue to have your blood tested as directed by your health care provider.  What should I know about cancer screening? There are several types of cancer. Take the following steps to reduce your risk and to catch any cancer development as early as possible. Breast Cancer  Practice breast self-awareness. ? This means understanding how your breasts normally appear and feel. ? It also means doing regular breast self-exams. Let your health care provider know about any changes, no matter how small.  If you are 32 or older, have a clinician do a breast exam (clinical breast exam or CBE) every year. Depending on your age, family history, and medical history, it may be recommended that you also have a yearly breast X-ray (mammogram).  If you have a family history of breast cancer, talk with your health care provider about genetic screening.  If you are at high risk for breast cancer, talk with your health care provider about having an MRI and a mammogram every year.  Breast cancer (BRCA) gene test is recommended for women who have family members with BRCA-related cancers. Results of the assessment will determine the need for genetic counseling and BRCA1 and for BRCA2 testing. BRCA-related cancers include these types: ? Breast. This occurs in males or females. ? Ovarian. ? Tubal. This may also be called fallopian tube cancer. ? Cancer of the abdominal or pelvic lining  (peritoneal cancer). ? Prostate. ? Pancreatic.  Cervical, Uterine, and Ovarian Cancer Your health care provider may recommend that you be screened regularly for cancer of the pelvic organs. These include your ovaries, uterus, and vagina. This screening involves a pelvic exam, which includes checking for microscopic changes to the surface of your cervix (Pap test).  For women ages 21-65, health care providers may recommend a pelvic exam and a Pap test every three years. For women ages 41-65, they may recommend the Pap test and pelvic exam, combined with testing for human papilloma virus (HPV), every five years. Some types of HPV increase your risk of cervical cancer. Testing for HPV may also be done on women of any age who have unclear Pap test results.  Other health care providers may not recommend any screening for nonpregnant women who are considered low risk for pelvic cancer and have no symptoms. Ask your health care provider if a screening pelvic exam is right for you.  If you have had past treatment for cervical cancer or a condition that could lead to cancer, you need Pap tests and screening for cancer for at least 20 years after your treatment. If Pap tests have been discontinued for you, your  risk factors (such as having a new sexual partner) need to be reassessed to determine if you should start having screenings again. Some women have medical problems that increase the chance of getting cervical cancer. In these cases, your health care provider may recommend that you have screening and Pap tests more often.  If you have a family history of uterine cancer or ovarian cancer, talk with your health care provider about genetic screening.  If you have vaginal bleeding after reaching menopause, tell your health care provider.  There are currently no reliable tests available to screen for ovarian cancer.  Lung Cancer Lung cancer screening is recommended for adults 81-57 years old who are at  high risk for lung cancer because of a history of smoking. A yearly low-dose CT scan of the lungs is recommended if you:  Currently smoke.  Have a history of at least 30 pack-years of smoking and you currently smoke or have quit within the past 15 years. A pack-year is smoking an average of one pack of cigarettes per day for one year.  Yearly screening should:  Continue until it has been 15 years since you quit.  Stop if you develop a health problem that would prevent you from having lung cancer treatment.  Colorectal Cancer  This type of cancer can be detected and can often be prevented.  Routine colorectal cancer screening usually begins at age 85 and continues through age 48.  If you have risk factors for colon cancer, your health care provider may recommend that you be screened at an earlier age.  If you have a family history of colorectal cancer, talk with your health care provider about genetic screening.  Your health care provider may also recommend using home test kits to check for hidden blood in your stool.  A small camera at the end of a tube can be used to examine your colon directly (sigmoidoscopy or colonoscopy). This is done to check for the earliest forms of colorectal cancer.  Direct examination of the colon should be repeated every 5-10 years until age 27. However, if early forms of precancerous polyps or small growths are found or if you have a family history or genetic risk for colorectal cancer, you may need to be screened more often.  Skin Cancer  Check your skin from head to toe regularly.  Monitor any moles. Be sure to tell your health care provider: ? About any new moles or changes in moles, especially if there is a change in a mole's shape or color. ? If you have a mole that is larger than the size of a pencil eraser.  If any of your family members has a history of skin cancer, especially at a young age, talk with your health care provider about genetic  screening.  Always use sunscreen. Apply sunscreen liberally and repeatedly throughout the day.  Whenever you are outside, protect yourself by wearing long sleeves, pants, a wide-brimmed hat, and sunglasses.  What should I know about osteoporosis? Osteoporosis is a condition in which bone destruction happens more quickly than new bone creation. After menopause, you may be at an increased risk for osteoporosis. To help prevent osteoporosis or the bone fractures that can happen because of osteoporosis, the following is recommended:  If you are 67-30 years old, get at least 1,000 mg of calcium and at least 600 mg of vitamin D per day.  If you are older than age 39 but younger than age 64, get at least 22,200  mg of calcium and at least 600 mg of vitamin D per day.  If you are older than age 107, get at least 1,200 mg of calcium and at least 800 mg of vitamin D per day.  Smoking and excessive alcohol intake increase the risk of osteoporosis. Eat foods that are rich in calcium and vitamin D, and do weight-bearing exercises several times each week as directed by your health care provider. What should I know about how menopause affects my mental health? Depression may occur at any age, but it is more common as you become older. Common symptoms of depression include:  Low or sad mood.  Changes in sleep patterns.  Changes in appetite or eating patterns.  Feeling an overall lack of motivation or enjoyment of activities that you previously enjoyed.  Frequent crying spells.  Talk with your health care provider if you think that you are experiencing depression. What should I know about immunizations? It is important that you get and maintain your immunizations. These include:  Tetanus, diphtheria, and pertussis (Tdap) booster vaccine.  Influenza every year before the flu season begins.  Pneumonia vaccine.  Shingles vaccine.  Your health care provider may also recommend other  immunizations. This information is not intended to replace advice given to you by your health care provider. Make sure you discuss any questions you have with your health care provider. Document Released: 02/06/2006 Document Revised: 07/04/2016 Document Reviewed: 09/18/2015 Elsevier Interactive Patient Education  2018 [employer].

## 2018-09-30 ENCOUNTER — Ambulatory Visit: Payer: Medicare HMO | Admitting: Licensed Clinical Social Worker

## 2018-09-30 NOTE — Progress Notes (Signed)
I reviewed health advisor's note, was available for consultation, and agree with documentation and plan.  

## 2018-10-01 NOTE — Progress Notes (Signed)
I reviewed health advisor's note, was available for consultation, and agree with documentation and plan.  

## 2018-10-05 ENCOUNTER — Ambulatory Visit: Payer: Self-pay | Admitting: Licensed Clinical Social Worker

## 2018-10-09 ENCOUNTER — Other Ambulatory Visit: Payer: Self-pay | Admitting: Family Medicine

## 2018-10-11 ENCOUNTER — Ambulatory Visit: Payer: Self-pay | Admitting: Licensed Clinical Social Worker

## 2018-10-13 ENCOUNTER — Ambulatory Visit: Payer: Self-pay | Admitting: *Deleted

## 2018-10-13 ENCOUNTER — Ambulatory Visit: Payer: Medicare HMO | Admitting: Licensed Clinical Social Worker

## 2018-10-13 NOTE — Telephone Encounter (Signed)
Pt reports severe headache since Sunday. H/O migraines and states feels similar, had aura. States has been taking her Fioricet with codeine 2 tabs 3 times daily since Sunday with no relief. States 10/10, "May go down to 7/10 when I\'m resting but horrible when I get back up." Headache "Starts at the top of my head and goes down the left side of my head into my neck." Last took Fioricet at 1200 noon. Reports nausea, no vomiting. Denies CP, weakness. Speech clear during call. Last migraine was 1 month ago. States has been checking her BP at home "It\'s been good." Directed pt to ED. States will get someone to drive her.  Reason for Disposition . [1] SEVERE headache (e.g., excruciating) AND [2] not improved after 2 hours of pain medicine  Answer Assessment - Initial Assessment Questions 1. LOCATION: "Where does it hurt?"      Top of head, down left side of head into neck. 2. ONSET: "When did the headache start?" (Minutes, hours or days)      Sunday 3. PATTERN: "Does the pain come and go, or has it been constant since it started?"     Constant, eases off "A little" with rest 4. SEVERITY: "How bad is the pain?" and "What does it keep you from doing?"  (e.g., Scale 1-10; mild, moderate, or severe)   - MILD (1-3): doesn\'t interfere with normal activities    - MODERATE (4-7): interferes with normal activities or awakens from sleep    - SEVERE (8-10): excruciating pain, unable to do any normal activities        10 /10 5. RECURRENT SYMPTOM: "Have you ever had headaches before?" If so, ask: "When was the last time?" and "What happened that time?"      1 month ago 6. CAUSE: "What do you think is causing the headache?"     Migraine 7. MIGRAINE: "Have you been diagnosed with migraine headaches?" If so, ask: "Is this headache similar?"      Yes, similar to migraines, but meds not helping, taking since Sunday 8. HEAD INJURY: "Has there been any recent injury to the head?"      no 9. OTHER SYMPTOMS: "Do you have  any other symptoms?" (fever, stiff neck, eye pain, sore throat, cold symptoms)     Nausea  Protocols used: HEADACHE-A-AH

## 2018-10-14 ENCOUNTER — Telehealth: Payer: Self-pay | Admitting: Behavioral Health

## 2018-10-14 NOTE — Telephone Encounter (Signed)
Received Summary of Benefits for Prolia. The estimated out of pocket expense is $255 for medication & administration of injection. No PA is required.   Called to inform patient of the above information; no answer. Per recording, the voice mailbox is full. Will attempt to reach patient at a later time.

## 2018-10-14 NOTE — Telephone Encounter (Signed)
Does not look like pt went to ED as she stated she would/thx  dmf

## 2018-10-18 ENCOUNTER — Ambulatory Visit (INDEPENDENT_AMBULATORY_CARE_PROVIDER_SITE_OTHER): Payer: Medicare HMO | Admitting: Licensed Clinical Social Worker

## 2018-10-18 DIAGNOSIS — F3341 Major depressive disorder, recurrent, in partial remission: Secondary | ICD-10-CM

## 2018-10-20 NOTE — Telephone Encounter (Signed)
Pt aware of message below. She states she would like the shot to be ordered. Please call to schedule once ordered.

## 2018-10-22 ENCOUNTER — Other Ambulatory Visit: Payer: Self-pay | Admitting: Internal Medicine

## 2018-10-22 ENCOUNTER — Other Ambulatory Visit: Payer: Self-pay | Admitting: Cardiovascular Disease

## 2018-10-22 NOTE — Telephone Encounter (Signed)
Refill already sent to pharmacy for 30 day supply. The patient needs a follow up appointment before further refills.

## 2018-10-25 ENCOUNTER — Ambulatory Visit: Payer: Self-pay | Admitting: Licensed Clinical Social Worker

## 2018-10-25 NOTE — Telephone Encounter (Signed)
Called to schedule patient for Prolia injection. No answer at the time of call. Left message stating that the injection would be ordered today & will be available in the office this week.

## 2018-10-26 ENCOUNTER — Telehealth: Payer: Self-pay | Admitting: Internal Medicine

## 2018-10-26 NOTE — Telephone Encounter (Signed)
Lmov for patient to call and schedule appointment  ° °Will try again at a later time ° °

## 2018-10-26 NOTE — Telephone Encounter (Signed)
-----   Message from Anselm Pancoast, [place] sent at 10/22/2018 11:10 AM EDT ----- Please contact patient for a follow up appointment with Dr. Saunders Revel.  Thanks, SHaron

## 2018-10-28 NOTE — Telephone Encounter (Signed)
Patient scheduled 11/19/18

## 2018-10-28 NOTE — Telephone Encounter (Signed)
Lmovm to verify what medications need to be refilled.

## 2018-11-01 MED ORDER — ATORVASTATIN CALCIUM 80 MG PO TABS: 80.0000 mg | ORAL_TABLET | Freq: Every day | ORAL | 1 refills | Status: DC

## 2018-11-01 MED ORDER — RANOLAZINE ER 500 MG PO TB12: ORAL_TABLET | ORAL | 1 refills | Status: DC

## 2018-11-01 NOTE — Telephone Encounter (Signed)
Pt has been schedule for prolia injection on 11-04-18

## 2018-11-01 NOTE — Telephone Encounter (Signed)
Patient calling to let us know she needs Ranolazine and Atorvastatin to be called in   Please call them

## 2018-11-01 NOTE — Addendum Note (Signed)
Addended by: Anselm Pancoast on: 11/01/2018 03:22 PM   Modules accepted: Orders

## 2018-11-04 ENCOUNTER — Ambulatory Visit: Payer: Self-pay

## 2018-11-05 ENCOUNTER — Ambulatory Visit (INDEPENDENT_AMBULATORY_CARE_PROVIDER_SITE_OTHER): Payer: Medicare HMO

## 2018-11-05 ENCOUNTER — Telehealth: Payer: Self-pay

## 2018-11-05 DIAGNOSIS — M810 Age-related osteoporosis without current pathological fracture: Secondary | ICD-10-CM | POA: Diagnosis not present

## 2018-11-05 MED ORDER — DENOSUMAB 60 MG/ML ~~LOC~~ SOSY
60.0000 mg | PREFILLED_SYRINGE | Freq: Once | SUBCUTANEOUS | Status: AC
  Administered 2018-11-05: 60 mg via SUBCUTANEOUS

## 2018-11-05 NOTE — Progress Notes (Signed)
Pt presented today with husband for Prolia injection. Subcutaneous injection given in L arm. Pt tolerated well with no observed S/S prior to leaving office.

## 2018-11-05 NOTE — Telephone Encounter (Signed)
Error

## 2018-11-05 NOTE — Progress Notes (Signed)
Documentation below noted and agree with plan and treatment

## 2018-11-05 NOTE — Telephone Encounter (Signed)
Pt called [facility] to make sure Prolia had come in before nurse visit this afternoon; warm transfer to [place] at [facility] over to talk with pt.

## 2018-11-08 ENCOUNTER — Telehealth: Payer: Self-pay

## 2018-11-08 MED ORDER — FLUTICASONE PROPIONATE 50 MCG/ACT NA SUSP: 2.0000 | Freq: Every day | NASAL | 6 refills | Status: DC

## 2018-11-08 NOTE — Telephone Encounter (Signed)
Spoke with pt and gave her all your orders. She verbalized the understanding.  Thanks

## 2018-11-08 NOTE — Telephone Encounter (Signed)
TA-Pt states that she has been taking Fioricet with Codeine for 15 years for her migraines prn/She started with a migraine that lasted 3d with body aches/it went away for 1.5d then came back for 7d/Came in for Prolia which was 7th day of migraine and still continued with migraine for 2 more days following this/she was advised to go to [facility] but she decided to stay home and wait to hear from us/she was not offered an appt with another provider but she says that when she called it was about 4pm/she doesn't know why this one lasted so long and normal Tx was ineffective/she also tried sinus medication and a warm compress which helped slightly/last time she had one that lasted more than 2d was 40-years-ago/she is unsure if needs to change medications or what? I apologized several times and she said "I am not having any headache right now and I knew that this wasn't like her to not respond to this and it's ok." Plz advise/thx dmf

## 2018-11-08 NOTE — Telephone Encounter (Signed)
I am so sorry that she had a difficult time reaching [place].  I am glad that her headache has resolved.  Let's try adding flonase (eRx sent to [facility]) for her sinus pressure- that can trigger migraines.  Please keep [place] updated.

## 2018-11-09 ENCOUNTER — Other Ambulatory Visit: Payer: Self-pay | Admitting: Family Medicine

## 2018-11-15 ENCOUNTER — Ambulatory Visit (INDEPENDENT_AMBULATORY_CARE_PROVIDER_SITE_OTHER): Payer: Medicare HMO | Admitting: Family Medicine

## 2018-11-15 ENCOUNTER — Encounter: Payer: Self-pay | Admitting: Family Medicine

## 2018-11-15 ENCOUNTER — Ambulatory Visit: Payer: Self-pay | Admitting: *Deleted

## 2018-11-15 ENCOUNTER — Ambulatory Visit (INDEPENDENT_AMBULATORY_CARE_PROVIDER_SITE_OTHER): Payer: Medicare HMO

## 2018-11-15 VITALS — BP 94/70 | HR 82 | Temp 98.3°F | Ht 62.5 in | Wt 192.8 lb

## 2018-11-15 DIAGNOSIS — M25512 Pain in left shoulder: Secondary | ICD-10-CM

## 2018-11-15 LAB — CK: Total CK: 55 U/L (ref 7–177)

## 2018-11-15 LAB — SEDIMENTATION RATE: SED RATE: 17 mm/h (ref 0–30)

## 2018-11-15 LAB — C-REACTIVE PROTEIN: CRP: 1 mg/dL (ref 0.5–20.0)

## 2018-11-15 NOTE — Progress Notes (Signed)
Diane [name] - 67 y.o. female MRN [id number]  Date of birth: 01/09/1951  SUBJECTIVE:  Including CC & ROS.  Chief Complaint  Patient presents with  . Arm Pain    bilateral arm pain that radiates from both collar bones.     Diane [name] is a 67 y.o. female that is presenting with left shoulder pain.  She reports the pain started a couple of weeks ago.  She believes that it occurred after the Prolia injection.  She feels in the lateral aspect of the shoulder.  She feels it radiate across the top of her chest into her other shoulder.  The right shoulder pain has improved.  She feels the pain constantly.  The pain is severe in nature.  She has not had any improvement with any therapies today.  Has been sharp and stabbing in nature.  No injury.  Denies any radicular symptoms.  Denies any rashes.  Has some bruising on the proximal lateral shoulder.   Review of Systems  Constitutional: Negative for fever.  HENT: Negative for congestion.   Cardiovascular: Negative for chest pain.  Gastrointestinal: Negative for abdominal pain.  Musculoskeletal: Positive for myalgias.  Skin: Negative for color change.  Neurological: Positive for weakness.  Hematological: Negative for adenopathy.  Psychiatric/Behavioral: Negative for agitation.    HISTORY: Past Medical, Surgical, Social, and Family History Reviewed & Updated per EMR.   Pertinent Historical Findings include:  Past Medical History:  Diagnosis Date  . Arthritis   . CAD (coronary artery disease)   . 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 Depression   . Ejection fraction   . GERD (gastroesophageal reflux disease)   . Hyperlipidemia   . Hypertension   . PONV (postoperative nausea and vomiting)   . Skin cancer of face 05/2015   basal cell  . Stroke [facility]) 2006   deaf in right ear    Past Surgical History:  Procedure Laterality Date  . BREAST BIOPSY  Left 1984   abcess  . CESAREAN SECTION    . CHOLECYSTECTOMY  1990  . CHONDROPLASTY Left 01/05/2015   Procedure: CHONDROPLASTY;  Surgeon: Yvette Rack., MD;  Location: [place];  Service: Orthopedics;  Laterality: Left;  . KNEE ARTHROSCOPY WITH MEDIAL MENISECTOMY Left 01/05/2015   Procedure: LEFT KNEE ARTHROSCOPY WITH MEDIAL AND LATERAL MENISECTOMY; DEBRIDEMENT LATERAL PATELLA FEMORAL ;  Surgeon: Yvette Rack., MD;  Location: [place];  Service: Orthopedics;  Laterality: Left;  . LEFT HEART CATHETERIZATION WITH CORONARY ANGIOGRAM N/A 10/28/2013   Procedure: LEFT HEART CATHETERIZATION WITH CORONARY ANGIOGRAM;  Surgeon: Larey Dresser, MD;  Location: [facility] CATH LAB;  Service: Cardiovascular;  Laterality: N/A;  . TOTAL KNEE ARTHROPLASTY Left 07/06/2015   Procedure: TOTAL KNEE ARTHROPLASTY;  Surgeon: Earlie Server, MD;  Location: [place];  Service: Orthopedics;  Laterality: Left;  . TUBAL LIGATION      Allergies  Allergen Reactions  . Fosamax [Alendronate Sodium] Other (See Comments)    GERD and joint pain    Family History  Problem Relation Age of Onset  . Healthy Mother   . Hypertension Mother   . Stroke Mother   . Stroke Father   . Heart failure Father   . Heart disease Father        CABG  . Ovarian cancer Paternal Grandmother   . Liver cancer  Paternal Grandfather   . Stroke Maternal Grandmother   . Heart failure Maternal Grandmother   . Heart failure Maternal Grandfather   . Colon cancer Neg Hx   . Stomach cancer Neg Hx   . Rectal cancer Neg Hx   . Esophageal cancer Neg Hx   . Breast cancer Neg Hx      Social History   Socioeconomic History  . Marital status: Significant Other    Spouse name: [name]  . Number of children: 8  . Years of education: Not on file  . Highest education level: Not on file  Occupational History  . 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 Financial resource  strain: Not on file  . Food insecurity:    Worry: Not on file    Inability: Not on file  . Transportation needs:    Medical: Not on file    Non-medical: Not on file  Tobacco Use  . Smoking status: Never Smoker  . Smokeless tobacco: Never Used  . Tobacco comment: LIVES WITH 2 SMOKERS   Substance and Sexual Activity  . Alcohol use: Yes    Comment: 1 glass of wine per month  . Drug use: No  . Sexual activity: Yes    Birth control/protection: Post-menopausal  Lifestyle  . Physical activity:    Days per week: Not on file    Minutes per session: Not on file  . Stress: Not on file  Relationships  . Social connections:    Talks on phone: Not on file    Gets together: Not on file    Attends religious service: Not on file    Active member of club or organization: Not on file    Attends meetings of clubs or organizations: Not on file    Relationship status: Not on file  . Intimate partner violence:    Fear of current or ex partner: Not on file    Emotionally abused: Not on file    Physically abused: Not on file    Forced sexual activity: Not on file  Other Topics Concern  . Not on file  Social History Narrative   Divorced, but has fiancee.  Lives with fiancee in [place].  Moved here recently from [place], [place].     She 8 children- ages 60- 52.                 PHYSICAL EXAM:  VS: BP 94/70 (BP Location: Right Arm, Patient Position: Sitting, Cuff Size: Normal)   Pulse 82   Temp 98.3 F (36.8 C) (Oral)   Ht 5' 2.5" (1.588 m)   Wt 192 lb 12.8 oz (87.5 kg)   SpO2 94%   BMI 34.70 kg/m  Physical Exam Gen: NAD, alert, cooperative with exam, well-appearing ENT: normal lips, normal nasal mucosa,  Eye: normal EOM, normal conjunctiva and lids CV:  no edema, +2 pedal pulses   Resp: no accessory muscle use, non-labored,   Skin: no rashes, no areas of induration  Neuro: normal tone, normal sensation to touch Psych:  normal insight, alert and oriented MSK:  Left  shoulder: Ecchymosis on the lateral aspect. Normal active flexion abduction. Normal external rotation. Normal strength resistance with internal and external rotation. Negative empty can testing and Hawkins testing. Normal grip strength. Mild pain with speeds testing. Neurovascular intact  Limited ultrasound: Left shoulder:  Normal-appearing biceps tendon. Normal-appearing subscapularis. Supraspinatus with chronic tendinopathy type changes  that are mild. No abscesses within the deltoid appreciated  Summary: Chronic supraspinatus tendinopathy type changes  Ultrasound and interpretation by Clearance Coots, MD      ASSESSMENT & PLAN:   Acute pain of left shoulder Unclear if her pain is associated with a reaction to the Prolia.  Possible for myalgias.  Unclear if it is autoimmune in nature.  Does not appear to be musculoskeletal or structural based on ultrasound. -CK, sed rate, CRP -Treatment will depend on lab work.

## 2018-11-15 NOTE — Telephone Encounter (Signed)
Contacted pt regarding her symptoms; she said that got prolia injection in left arm on 11/05/18; she is having pain in both arms form elbows up to from collar bone to neck L>R; the pt says that she can barely move her left arl the pain in her neck is also causing her to have a headache; she rates her headache at 10 out of 10; she also complains of left hip pain 11/14/18 but this pain has resolved; she says that she now has no pain from the waist down; the pain that she is having is described as "deep" and she is not sure if it is the muscle or the bone; the pt has had advil, excedrin migraine and arthritis tylenol but has no relief; she also states that the icy hot patch nor cream have helps; recommendations made per nurse triage protocol; the pt normally sees [name] and would like to be seen today in the office; [name] has no availability today; pt offered and accepted appointment with Dr Clearance Coots, [facility], 11/15/18 at 1300; the pt verbalized understanding; also spoke with [facility] regarding scheduling will route to office for notification of this upcoming appointment.  Reason for Disposition . Pain is worsened or caused by bending the neck  Answer Assessment - Initial Assessment Questions 1. ONSET: "When did the pain start?"     11/04/18 2. LOCATION: "Where is the pain located?"      3. PAIN: "How bad is the pain?" (Scale 1-10; or mild, moderate, severe)   - MILD (1-3): doesn't interfere with normal activities   - MODERATE (4-7): interferes with normal activities (e.g., work or school) or awakens from sleep   - SEVERE (8-10): excruciating pain, unable to do any normal activities, unable to hold a cup of water     severe 4. WORK OR EXERCISE: "Has there been any recent work or exercise that involved this part of the body?"     no 5. CAUSE: "What do you think is causing the arm pain?"     prolia 6. OTHER SYMPTOMS: "Do you have any other symptoms?" (e.g., neck pain, swelling, rash, fever,  numbness, weakness)     Neck pain, headache, pain between shoulder blades 7. PREGNANCY: "Is there any chance you are pregnant?" "When was your last menstrual period?"     no  Protocols used: ARM PAIN-A-AH

## 2018-11-15 NOTE — Patient Instructions (Signed)
Nice to meet you  Please try heat and ice on the area  I will call you with the results from today.  Please try the rub on medicine

## 2018-11-16 ENCOUNTER — Encounter: Payer: Self-pay | Admitting: Family Medicine

## 2018-11-16 ENCOUNTER — Ambulatory Visit: Payer: Self-pay

## 2018-11-16 ENCOUNTER — Ambulatory Visit (INDEPENDENT_AMBULATORY_CARE_PROVIDER_SITE_OTHER): Payer: Medicare HMO

## 2018-11-16 ENCOUNTER — Ambulatory Visit (INDEPENDENT_AMBULATORY_CARE_PROVIDER_SITE_OTHER): Payer: Medicare HMO | Admitting: Family Medicine

## 2018-11-16 ENCOUNTER — Ambulatory Visit (INDEPENDENT_AMBULATORY_CARE_PROVIDER_SITE_OTHER): Payer: Medicare HMO | Admitting: Licensed Clinical Social Worker

## 2018-11-16 VITALS — BP 110/70 | HR 73 | Temp 98.1°F | Ht 62.5 in | Wt 192.0 lb

## 2018-11-16 DIAGNOSIS — G8929 Other chronic pain: Secondary | ICD-10-CM | POA: Insufficient documentation

## 2018-11-16 DIAGNOSIS — M25512 Pain in left shoulder: Secondary | ICD-10-CM

## 2018-11-16 DIAGNOSIS — F3341 Major depressive disorder, recurrent, in partial remission: Secondary | ICD-10-CM | POA: Diagnosis not present

## 2018-11-16 DIAGNOSIS — M25511 Pain in right shoulder: Secondary | ICD-10-CM

## 2018-11-16 MED ORDER — PREDNISONE 5 MG PO TABS: ORAL_TABLET | ORAL | 0 refills | Status: DC

## 2018-11-16 NOTE — Patient Instructions (Signed)
Good to see you  I will call you with the results from today  Please try the prednisone

## 2018-11-16 NOTE — Telephone Encounter (Signed)
Pt c/o pain after receiving Prolia shot on 11/05/18. Pt c/o pain to the middle of the back of her neck, collar bones and from bilateral shoulders, both arms down to the elbow. Pt stated left arm pain is greater than right arm pain. Pt rates the pain as severe. Pt stated that if her neck gets in the wrong position it gives her a headache. Pt has been taking Tylenol arthritis every 8 hours or Advil 600 mg every 6 hours or Excedrin migraine. Pt stated that the pain medication was not working so she quit taking any more of it. Pt stated she feels that the Prolia shot is causing her discomfort. Disposition is to see PCP within four hours  (or PCP triage). Called PCP office and spoke with Tori who stated pt can make an appointment with Dr Raeford Razor. Pt wanted to see [name] but no openings. Pt given appt with Dr Raeford Razor today at 3:40 today.  Reason for Disposition . [1] SEVERE neck pain (e.g., excruciating, unable to do any normal activities) AND [2] not improved after 2 hours of pain medicine  Answer Assessment - Initial Assessment Questions 1. ONSET: "When did the pain begin?"      After the Prolia shut- 11/04/18 after Prolia shot  2. LOCATION: "Where does it hurt?"     Middle of back of neck, collar bones on both sides, both arms down to the elbow (left arm is worse than the right) 3. PATTERN "Does the pain come and go, or has it been constant since it started?"      Constant since it started 4. SEVERITY: "How bad is the pain?"  (Scale 1-10; or mild, moderate, severe)   - MILD (1-3): doesn't interfere with normal activities    - MODERATE (4-7): interferes with normal activities or awakens from sleep    - SEVERE (8-10):  excruciating pain, unable to do any normal activities      severe 5. RADIATION: "Does the pain go anywhere else, shoot into your arms?"     Goes from the back to the collar bones shoulder 6. CORD SYMPTOMS: "Any weakness or numbness of the arms or legs?"     no 7. CAUSE: "What do you  think is causing the neck pain?"     Prolia shot 8. NECK OVERUSE: "Any recent activities that involved turning or twisting the neck?"     no 9. OTHER SYMPTOMS: "Do you have any other symptoms?" (e.g., headache, fever, chest pain, difficulty breathing, neck swelling)    When neck is hurting has a headache but goes away when she gets head in a good position 10. PREGNANCY: "Is there any chance you are pregnant?" "When was your last menstrual period?"       n/a  Protocols used: NECK PAIN OR STIFFNESS-A-AH

## 2018-11-16 NOTE — Assessment & Plan Note (Addendum)
Unclear if her pain is associated with a reaction to the Prolia.  Possible for myalgias.  Unclear if it is autoimmune in nature.  Does not appear to be musculoskeletal or structural based on ultrasound. -CK, sed rate, CRP - consider sending prednisone

## 2018-11-17 ENCOUNTER — Telehealth: Payer: Self-pay | Admitting: Family Medicine

## 2018-11-17 ENCOUNTER — Telehealth: Payer: Self-pay

## 2018-11-17 LAB — TSH: TSH: 0.46 u[IU]/mL (ref 0.35–4.50)

## 2018-11-17 LAB — CBC WITH DIFFERENTIAL/PLATELET
BASOS PCT: 0.5 % (ref 0.0–3.0)
Basophils Absolute: 0 10*3/uL (ref 0.0–0.1)
Eosinophils Absolute: 0.1 10*3/uL (ref 0.0–0.7)
Eosinophils Relative: 2.3 % (ref 0.0–5.0)
HCT: 42.1 % (ref 36.0–46.0)
Hemoglobin: 14.5 g/dL (ref 12.0–15.0)
LYMPHS ABS: 1.4 10*3/uL (ref 0.7–4.0)
Lymphocytes Relative: 24.7 % (ref 12.0–46.0)
MCHC: 34.5 g/dL (ref 30.0–36.0)
MCV: 96.9 fl (ref 78.0–100.0)
MONO ABS: 0.3 10*3/uL (ref 0.1–1.0)
Monocytes Relative: 5.7 % (ref 3.0–12.0)
NEUTROS ABS: 3.8 10*3/uL (ref 1.4–7.7)
Neutrophils Relative %: 66.8 % (ref 43.0–77.0)
PLATELETS: 183 10*3/uL (ref 150.0–400.0)
RBC: 4.35 Mil/uL (ref 3.87–5.11)
RDW: 12.6 % (ref 11.5–15.5)
WBC: 5.6 10*3/uL (ref 4.0–10.5)

## 2018-11-17 LAB — COMPREHENSIVE METABOLIC PANEL
ALBUMIN: 4.5 g/dL (ref 3.5–5.2)
ALK PHOS: 68 U/L (ref 39–117)
ALT: 27 U/L (ref 0–35)
AST: 24 U/L (ref 0–37)
BILIRUBIN TOTAL: 0.5 mg/dL (ref 0.2–1.2)
BUN: 7 mg/dL (ref 6–23)
CO2: 29 mEq/L (ref 19–32)
Calcium: 9.9 mg/dL (ref 8.4–10.5)
Chloride: 97 mEq/L (ref 96–112)
Creatinine, Ser: 0.7 mg/dL (ref 0.40–1.20)
GFR: 88.93 mL/min (ref >60.00)
GLUCOSE: 98 mg/dL (ref 70–99)
POTASSIUM: 3.4 meq/L — AB (ref 3.5–5.1)
SODIUM: 136 meq/L (ref 135–145)
TOTAL PROTEIN: 7.6 g/dL (ref 6.0–8.3)

## 2018-11-17 NOTE — Telephone Encounter (Addendum)
Patient returned called, she was informed of results below by Dr. Raeford Razor, she verbalized understanding.

## 2018-11-17 NOTE — Telephone Encounter (Signed)
Left VM for patient. If she calls back please have her speak with a nurse/CMA and inform that her xray showed mild degenerative changes of the ac joint but was otherwise normal. The lab work has not returned yet. The [facility] can report results to patient.   If any questions then please take the best time and phone number to call and I will try to call her back.   Rosemarie Ax, MD [facility] 11/17/2018, 9:32 AM

## 2018-11-17 NOTE — Telephone Encounter (Signed)
Copied from [phone]. Topic: General - Call Back - No Documentation >> Nov 17, 2018  9:14 AM Virl Axe D wrote: Reason for CRM: Pt had a missed call she believes was from Dr. Raeford Razor regarding her lab results. He noted he would call her with them. Please reach back out to pt with results. WU#[phone]

## 2018-11-17 NOTE — Progress Notes (Signed)
Diane [name] - 67 y.o. female MRN [id number]  Date of birth: 15-Mar-1951  SUBJECTIVE:  Including CC & ROS.  Chief Complaint  Patient presents with  . Shoulder Pain    both shoulders , radiating to neck     Diane [name] is a 67 y.o. female that is following up for left shoulder pain.  Pain is been ongoing since she was seen yesterday.  Has not tried any medications that improved her symptoms.  Lab work is been normal to date.  Pain is significant.  Having pain that is constant and worse at night.  Denies any radicular symptoms.    Review of Systems  Constitutional: Negative for fever.  HENT: Negative for congestion.   Respiratory: Negative for cough.   Cardiovascular: Negative for chest pain.  Gastrointestinal: Negative for abdominal pain.  Musculoskeletal: Positive for arthralgias.  Skin: Negative for color change.  Neurological: Positive for weakness.  Hematological: Negative for adenopathy.    HISTORY: Past Medical, Surgical, Social, and Family History Reviewed & Updated per EMR.   Pertinent Historical Findings include:  Past Medical History:  Diagnosis Date  . Arthritis   . CAD (coronary artery disease)   . 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 Depression   . Ejection fraction   . GERD (gastroesophageal reflux disease)   . Hyperlipidemia   . Hypertension   . PONV (postoperative nausea and vomiting)   . Skin cancer of face 05/2015   basal cell  . Stroke [facility]) 2006   deaf in right ear    Past Surgical History:  Procedure Laterality Date  . BREAST BIOPSY Left 1984   abcess  . CESAREAN SECTION    . CHOLECYSTECTOMY  1990  . CHONDROPLASTY Left 01/05/2015   Procedure: CHONDROPLASTY;  Surgeon: Yvette Rack., MD;  Location: [place];  Service: Orthopedics;  Laterality: Left;  . KNEE ARTHROSCOPY WITH MEDIAL MENISECTOMY Left 01/05/2015   Procedure: LEFT KNEE  ARTHROSCOPY WITH MEDIAL AND LATERAL MENISECTOMY; DEBRIDEMENT LATERAL PATELLA FEMORAL ;  Surgeon: Yvette Rack., MD;  Location: [place];  Service: Orthopedics;  Laterality: Left;  . LEFT HEART CATHETERIZATION WITH CORONARY ANGIOGRAM N/A 10/28/2013   Procedure: LEFT HEART CATHETERIZATION WITH CORONARY ANGIOGRAM;  Surgeon: Larey Dresser, MD;  Location: [facility] CATH LAB;  Service: Cardiovascular;  Laterality: N/A;  . TOTAL KNEE ARTHROPLASTY Left 07/06/2015   Procedure: TOTAL KNEE ARTHROPLASTY;  Surgeon: Earlie Server, MD;  Location: [place];  Service: Orthopedics;  Laterality: Left;  . TUBAL LIGATION      Allergies  Allergen Reactions  . Fosamax [Alendronate Sodium] Other (See Comments)    GERD and joint pain    Family History  Problem Relation Age of Onset  . Healthy Mother   . Hypertension Mother   . Stroke Mother   . Stroke Father   . Heart failure Father   . Heart disease Father        CABG  . Ovarian cancer Paternal Grandmother   . Liver cancer Paternal Grandfather   . Stroke Maternal Grandmother   . Heart failure Maternal Grandmother   . Heart failure Maternal Grandfather   . Colon cancer Neg Hx   . Stomach cancer Neg Hx   . Rectal cancer Neg Hx   . Esophageal cancer Neg Hx   . Breast cancer Neg Hx  Social History   Socioeconomic History  . Marital status: Significant Other    Spouse name: [name]  . Number of children: 8  . Years of education: Not on file  . Highest education level: Not on file  Occupational History  . 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 Financial resource strain: Not on file  . Food insecurity:    Worry: Not on file    Inability: Not on file  . Transportation needs:    Medical: Not on file    Non-medical: Not on file  Tobacco Use  . Smoking status: Never Smoker  . Smokeless tobacco: Never Used  . Tobacco comment: LIVES WITH 2 SMOKERS   Substance and Sexual  Activity  . Alcohol use: Yes    Comment: 1 glass of wine per month  . Drug use: No  . Sexual activity: Yes    Birth control/protection: Post-menopausal  Lifestyle  . Physical activity:    Days per week: Not on file    Minutes per session: Not on file  . Stress: Not on file  Relationships  . Social connections:    Talks on phone: Not on file    Gets together: Not on file    Attends religious service: Not on file    Active member of club or organization: Not on file    Attends meetings of clubs or organizations: Not on file    Relationship status: Not on file  . Intimate partner violence:    Fear of current or ex partner: Not on file    Emotionally abused: Not on file    Physically abused: Not on file    Forced sexual activity: Not on file  Other Topics Concern  . Not on file  Social History Narrative   Divorced, but has fiancee.  Lives with fiancee in [place].  Moved here recently from [place], [place].     She 8 children- ages 61- 93.                 PHYSICAL EXAM:  VS: BP 110/70 (BP Location: Right Arm, Patient Position: Sitting, Cuff Size: Normal)   Pulse 73   Temp 98.1 F (36.7 C) (Oral)   Ht 5' 2.5" (1.588 m)   Wt 192 lb (87.1 kg)   SpO2 97%   BMI 34.56 kg/m  Physical Exam Gen: NAD, alert, cooperative with exam, well-appearing ENT: normal lips, normal nasal mucosa,  Eye: normal EOM, normal conjunctiva and lids CV:  no edema, +2 pedal pulses   Resp: no accessory muscle use, non-labored,  Skin: no rashes, no areas of induration  Neuro: normal tone, normal sensation to touch Psych:  normal insight, alert and oriented MSK:  Left shoulder: Normal passive flexion and abduction. Normal external rotation. Normal strength resistance with internal and external rotation. Normal empty can testing. Neurovascular intact     ASSESSMENT & PLAN:   Acute pain of left shoulder Lab work is been normal to date.  Unlikely for polymyalgia.  Possibly she is having  interactions with 1 of her medications.  Seems less likely to be associated with an autoimmune problem. -X-ray. -Lab work today. -Prednisone. -If no improvement can consider referral to allergy

## 2018-11-17 NOTE — Assessment & Plan Note (Signed)
Lab work is been normal to date.  Unlikely for polymyalgia.  Possibly she is having interactions with 1 of her medications.  Seems less likely to be associated with an autoimmune problem. -X-ray. -Lab work today. -Prednisone. -If no improvement can consider referral to allergy

## 2018-11-19 ENCOUNTER — Ambulatory Visit: Payer: Self-pay | Admitting: Nurse Practitioner

## 2018-11-22 ENCOUNTER — Encounter: Payer: Self-pay | Admitting: Nurse Practitioner

## 2018-11-22 NOTE — Telephone Encounter (Signed)
Unable to leave VM for patient. If she calls back please have her speak with a nurse/CMA and inform that her lab testing and xray has been normal to date. The [facility] can report results to patient.   If any questions then please take the best time and phone number to call and I will try to call her back.   Rosemarie Ax, MD [facility] 11/22/2018, 5:11 PM

## 2018-11-23 ENCOUNTER — Telehealth: Payer: Self-pay

## 2018-11-23 NOTE — Telephone Encounter (Signed)
Pt trying to get lab results; I did warm transfer to [place] [phone].

## 2018-11-23 NOTE — Telephone Encounter (Signed)
Patient called back to get her lab results from 11/16/18. Please advise.

## 2018-11-26 ENCOUNTER — Other Ambulatory Visit: Payer: Self-pay | Admitting: Family Medicine

## 2018-11-29 LAB — C3 AND C4
C3 Complement: 177 mg/dL (ref 83–193)
C4 Complement: 35 mg/dL (ref 15–57)

## 2018-11-29 LAB — ANA,IFA RA DIAG PNL W/RFLX TIT/PATN
Anti Nuclear Antibody(ANA): POSITIVE — AB
Cyclic Citrullin Peptide Ab: <16 UNITS
Rhuematoid fact SerPl-aCnc: <14 IU/mL (ref <14)

## 2018-11-29 LAB — ANTI-NUCLEAR AB-TITER (ANA TITER): ANA Titer 1: 1:40 {titer} — ABNORMAL HIGH

## 2018-11-29 NOTE — Telephone Encounter (Signed)
Spoke with pt and she is aware of lab results.

## 2018-11-30 ENCOUNTER — Telehealth: Payer: Self-pay | Admitting: Family Medicine

## 2018-11-30 NOTE — Telephone Encounter (Signed)
Informed patient of her results.   Rosemarie Ax, MD [facility] Medicine 11/30/2018, 5:01 PM

## 2018-12-01 ENCOUNTER — Encounter: Payer: Self-pay | Admitting: Nurse Practitioner

## 2018-12-01 ENCOUNTER — Ambulatory Visit (INDEPENDENT_AMBULATORY_CARE_PROVIDER_SITE_OTHER): Payer: Medicare HMO | Admitting: Licensed Clinical Social Worker

## 2018-12-01 ENCOUNTER — Ambulatory Visit: Payer: Medicare HMO | Admitting: Nurse Practitioner

## 2018-12-01 VITALS — BP 100/60 | HR 71 | Ht 62.0 in | Wt 191.0 lb

## 2018-12-01 DIAGNOSIS — E782 Mixed hyperlipidemia: Secondary | ICD-10-CM | POA: Diagnosis not present

## 2018-12-01 DIAGNOSIS — F3341 Major depressive disorder, recurrent, in partial remission: Secondary | ICD-10-CM

## 2018-12-01 DIAGNOSIS — R079 Chest pain, unspecified: Secondary | ICD-10-CM | POA: Diagnosis not present

## 2018-12-01 DIAGNOSIS — I10 Essential (primary) hypertension: Secondary | ICD-10-CM

## 2018-12-01 MED ORDER — RANOLAZINE ER 500 MG PO TB12: ORAL_TABLET | ORAL | 0 refills | Status: DC

## 2018-12-01 MED ORDER — ATORVASTATIN CALCIUM 80 MG PO TABS: 80.0000 mg | ORAL_TABLET | Freq: Every day | ORAL | 0 refills | Status: DC

## 2018-12-01 MED ORDER — DILTIAZEM HCL ER COATED BEADS 180 MG PO CP24: 180.0000 mg | ORAL_CAPSULE | Freq: Every day | ORAL | 0 refills | Status: DC

## 2018-12-01 NOTE — Patient Instructions (Signed)
Medication Instructions:  Your physician recommends that you continue on your current medications as directed. Please refer to the Current Medication list given to you today.  If you need a refill on your cardiac medications before your next appointment, please call your pharmacy.   Lab work: None ordered    If you have labs (blood work) drawn today and your tests are completely normal, you will receive your results only by: [name] MyChart Message (if you have MyChart) OR . A paper copy in the mail If you have any lab test that is abnormal or we need to change your treatment, we will call you to review the results.  Testing/Procedures: None ordered   Follow-Up: At [facility], you and your health needs are our priority.  As part of our continuing mission to provide you with exceptional heart care, we have created designated Provider Care Teams.  These Care Teams include your primary Cardiologist (physician) and Advanced Practice Providers (APPs -  Physician Assistants and Nurse Practitioners) who all work together to provide you with the care you need, when you need it. You will need a follow up appointment in 1 years.  Please call our office 2 months in advance to schedule this appointment.  You may see Nelva Bush, MD or one of the following Advanced Practice Providers on your designated Care Team:   Murray Hodgkins, NP Christell Faith, PA-C . Marrianne Mood, PA-C

## 2018-12-01 NOTE — Progress Notes (Signed)
Office Visit    Patient Name: Diane [name] Date of Encounter: 12/01/2018  Primary Care Provider:  Lucille Passy, MD Primary Cardiologist:  Nelva Bush, MD  Chief Complaint    67 year old female with a history of nonobstructive CAD, stroke, hypertension, hyperlipidemia, and GERD, who presents for annual follow-up secondary to history of chest pain.  Past Medical History    Past Medical History:  Diagnosis Date  . Arthritis   . 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 Depression   . Diastolic dysfunction    a. 01/2013 Echo: EF 55-60%, no rwma, Gr1 DD, PASP 10mmHg; b. 04/2017 Echo: EF 65-70%, no rwma, Gr1 DD.  [name] GERD (gastroesophageal reflux disease)   . Hyperlipidemia   . Hypertension   . Non-obstructive CAD (coronary artery disease)    a. 09/2013 Cath: LM nl, LAD 30p, 35m, LCX nl, RCA 30p, EF 55-60%; b. 04/2017 MV: EF>65%, apical defect - most consistent w/ shifting breast attenuation. Small area of isch cannot be excluded.  [name] PONV (postoperative nausea and vomiting)   . Skin cancer of face 05/2015   basal cell  . Stroke [facility]) 2006   deaf in right ear   Past Surgical History:  Procedure Laterality Date  . BREAST BIOPSY Left 1984   abcess  . CESAREAN SECTION    . CHOLECYSTECTOMY  1990  . CHONDROPLASTY Left 01/05/2015   Procedure: CHONDROPLASTY;  Surgeon: Yvette Rack., MD;  Location: [place];  Service: Orthopedics;  Laterality: Left;  . KNEE ARTHROSCOPY WITH MEDIAL MENISECTOMY Left 01/05/2015   Procedure: LEFT KNEE ARTHROSCOPY WITH MEDIAL AND LATERAL MENISECTOMY; DEBRIDEMENT LATERAL PATELLA FEMORAL ;  Surgeon: Yvette Rack., MD;  Location: [place];  Service: Orthopedics;  Laterality: Left;  . LEFT HEART CATHETERIZATION WITH CORONARY ANGIOGRAM N/A 10/28/2013   Procedure: LEFT HEART CATHETERIZATION WITH CORONARY ANGIOGRAM;  Surgeon: Larey Dresser, MD;   [facility];  Service: Cardiovascular;  Laterality: N/A;  . TOTAL KNEE ARTHROPLASTY Left 07/06/2015   Procedure: TOTAL KNEE ARTHROPLASTY;  Surgeon: Earlie Server, MD;  Location: [place];  Service: Orthopedics;  Laterality: Left;  . TUBAL LIGATION      Allergies  Allergies  Allergen Reactions  . Fosamax [Alendronate Sodium] Other (See Comments)    GERD and joint pain    History of Present Illness    67 year old female with a history of chest pain and nonobstructive CAD status post catheterization in October 2014, with more recent low risk Myoview in May 2018, prior stroke, hypertension, hyperlipidemia, and GERD.  She was last seen in clinic in November 2018, at which time she was doing well following initiation of ranolazine therapy.  She has since retired and in that setting, has been more focused on her health.  She is down 14 pounds and has been walking regularly.  She notes an increase in exercise tolerance.  She has not had any exertional chest pain but occasionally notes indigestion-like symptoms associated with belching, typically occurring after eating, especially after eating spicy foods.  She does take Nexium daily and also uses Tums as needed.  Overall, she is quite pleased with her improved energy and exercise tolerance.  She has also noticed improvement in her mood and anxiety levels.  She has been dealing with joint pain and arthralgias.  She recently had a positive ANA  with a titer of 1:40.  She is being evaluated by primary care.  Home Medications    Prior to Admission medications   Medication Sig Start Date End Date Taking? Authorizing Provider  aspirin EC 81 MG tablet Take 1 tablet (81 mg total) by mouth daily. 03/31/17   End, Harrell Gave, MD  atorvastatin (LIPITOR) 80 MG tablet Take 1 tablet (80 mg total) by mouth daily. 11/01/18   End, Harrell Gave, MD  benazepril-hydrochlorthiazide (LOTENSIN HCT) 20-12.5 MG tablet Take 1 tablet by mouth daily. 08/23/18   Lucille Passy,  MD  budesonide-formoterol [facility]) 80-4.5 MCG/ACT inhaler Inhale 2 puffs into the lungs 2 (two) times daily. 07/14/17   Mannam, Hart Robinsons, MD  butalbital-acetaminophen-caffeine (FIORICET WITH CODEINE) 50-325-40-30 MG capsule TAKE ONE TO TWO CAPSULES BY MOUTH DAILY AS NEEDED FOR HEADACHE 11/09/18   Lucille Passy, MD  cholecalciferol (VITAMIN D) 1000 UNITS tablet Take 1,000 Units by mouth daily.    [provider]  diazepam (VALIUM) 5 MG tablet TAKE ONE TABLET BY MOUTH EVERY 8 HOURS AS NEEDED FOR ANXIETY 11/09/18   Lucille Passy, MD  diclofenac sodium (VOLTAREN) 1 % GEL Apply 2 g 4 (four) times daily topically. 11/12/17   Rutherford Guys, MD  diltiazem (CARDIZEM CD) 180 MG 24 hr capsule Take 1 capsule (180 mg total) by mouth daily. 03/08/18 08/23/18  Lucille Passy, MD  fluticasone (FLONASE) 50 MCG/ACT nasal spray Place 2 sprays into both nostrils daily. 11/08/18   Lucille Passy, MD  furosemide (LASIX) 20 MG tablet Take 1 tablet (20 mg total) by mouth daily. 03/08/18 03/08/19  Lucille Passy, MD  predniSONE (DELTASONE) 5 MG tablet Take 6 pills for first day, 5 pills second day, 4 pills third day, 3 pills fourth day, 2 pills the fifth day, and 1 pill sixth day. 11/16/18   Rosemarie Ax, MD  PROAIR HFA 108 [phone] Base) MCG/ACT inhaler INHALE 1 TO 2 PUFFS INTO THE LUNGS EVERY 6 HOURS AS NEEDED FOR WHEEZING OR SHORTNESS OF BREATH 04/03/17   Lucille Passy, MD  ranolazine (RANEXA) 500 MG 12 hr tablet TAKE 1 TABLET(500 MG) BY MOUTH TWICE DAILY 11/01/18   End, Harrell Gave, MD  sertraline (ZOLOFT) 100 MG tablet Take 1.5 tablets (150 mg total) by mouth daily. 09/08/18   Lucille Passy, MD  traZODone (DESYREL) 150 MG tablet TAKE 1 TABLET BY MOUTH EVERY NIGHT AT BEDTIME AS NEEDED FOR SLEEP 11/28/18   Lucille Passy, MD  valACYclovir (VALTREX) 1000 MG tablet TAKE 1 TABLET BY MOUTH TWICE DAILY 11/24/17   Lucille Passy, MD  Nexium 40 mg daily  Review of Systems    Significant improvement in dyspnea on exertion.  She  denies angina and occasionally has GERD-like symptoms that improved with Tums.  She denies palpitations, PND, orthopnea, dizziness, syncope, edema, or early satiety.  She has been experiencing myalgias and arthralgias.  All other systems reviewed and are otherwise negative except as noted above.  Physical Exam    VS:  BP 100/60 (BP Location: Left Arm, Patient Position: Sitting, Cuff Size: Normal)   Pulse 71   Ht 5\' 2"  (1.575 m)   Wt 191 lb (86.6 kg)   BMI 34.93 kg/m  , BMI Body mass index is 34.93 kg/m. GEN: Well nourished, well developed, in no acute distress. HEENT: normal. Neck: Supple, no JVD, carotid bruits, or masses. Cardiac: RRR, no murmurs, rubs, or gallops. No clubbing, cyanosis, edema.  Radials/DP/PT 2+ and equal bilaterally.  Respiratory:  Respirations regular and unlabored, clear to auscultation bilaterally. GI: Soft, nontender, nondistended, BS + x 4. MS: no deformity or atrophy. Skin: warm and very dry, no rash. Neuro:  Strength and sensation are intact. Psych: Normal affect.  Accessory Clinical Findings    ECG personally reviewed by me today -regular sinus rhythm, 71, leftward axis- no acute changes.  Lab Results  Component Value Date   CHOL 164 09/08/2018   HDL 65.30 09/08/2018   LDLCALC 68 09/08/2018   TRIG 154.0 (H) 09/08/2018   CHOLHDL 3 09/08/2018    Lab Results  Component Value Date   CREATININE 0.70 11/16/2018   BUN 7 11/16/2018   NA 136 11/16/2018   K 3.4 (L) 11/16/2018   CL 97 11/16/2018   CO2 29 11/16/2018    Lab Results  Component Value Date   ALT 27 11/16/2018   AST 24 11/16/2018   ALKPHOS 68 11/16/2018   BILITOT 0.5 11/16/2018    Assessment & Plan    1.  Chest pain/nonobstructive CAD: Patient has been doing well over the past year.  She has increased activity and has noted improvement in exercise tolerance.  She has not been experiencing any angina and continues to tolerate ranolazine well.  She does have GERD with indigestion-like  symptoms that seem to improve with Nexium and Tums.  She remains on aspirin, statin, and ranolazine therapy.  No changes today.  2.  Essential hypertension: Blood pressure is soft and she notes that some of her medications have been reduced in dose.  No changes today.  3.  Hyperlipidemia: LDL was 68 in September with normal LFTs in November.  Continue statin therapy.  4.  GERD: She notes some symptoms every day.  She had an EGD in February 2018 that showed esophagitis.  She is on daily Nexium therapy and uses Tums as needed.  5.  Disposition: Follow-up in 1 year or sooner if necessary.   Murray Hodgkins, NP 12/01/2018, 12:29 PM

## 2018-12-02 ENCOUNTER — Telehealth: Payer: Self-pay

## 2018-12-02 NOTE — Telephone Encounter (Signed)
Pt left v/m; pt rolled over in bed last night; pt got [place] in rt leg from hip to the knee and the knee to the ankle. The upper leg muscle and the lower leg muscle were hurting.Pt is not sure what to take; pt took one valium last night to get leg to relax before pt could get to sleep. Pt request cb. Call came to [facility] in error. Fwd note to flow coordinator at [place], [place].

## 2018-12-07 ENCOUNTER — Telehealth: Payer: Self-pay

## 2018-12-07 DIAGNOSIS — M25512 Pain in left shoulder: Secondary | ICD-10-CM

## 2018-12-07 NOTE — Telephone Encounter (Signed)
JS-I have pt scheduled for a lab visit for tomorrow at 10:15am for repeat ANA as when I spoke to her about another call she made she said you wanted her to have that done 2 weeks from the last one/She mentioned that you may want to do other labs as well/she states that her hands are still very tingly/Plz advise/thx dmf

## 2018-12-07 NOTE — Telephone Encounter (Signed)
I spoke to pt/I have scheduled her for a lab visit for tomorrow at 10:15am/sent a message to Dr. Schmitz/thx dmf

## 2018-12-07 NOTE — Telephone Encounter (Signed)
Can place cervical xray. The more specific labs are done by a rheumatologist. She can be referred if she would like that.   Rosemarie Ax, MD [facility] Primary Care & Sports Medicine 12/07/2018, 3:14 PM

## 2018-12-07 NOTE — Telephone Encounter (Signed)
° °  Pt called back and said she wanted to have c spine xray along with the blood work

## 2018-12-08 ENCOUNTER — Ambulatory Visit (INDEPENDENT_AMBULATORY_CARE_PROVIDER_SITE_OTHER): Payer: Medicare HMO

## 2018-12-08 ENCOUNTER — Other Ambulatory Visit: Payer: Medicare HMO

## 2018-12-08 ENCOUNTER — Other Ambulatory Visit: Payer: Self-pay | Admitting: Family Medicine

## 2018-12-08 DIAGNOSIS — M47812 Spondylosis without myelopathy or radiculopathy, cervical region: Secondary | ICD-10-CM | POA: Diagnosis not present

## 2018-12-08 DIAGNOSIS — M25512 Pain in left shoulder: Secondary | ICD-10-CM | POA: Diagnosis not present

## 2018-12-08 NOTE — Telephone Encounter (Signed)
TA-LOV with you: 10.2.19/LOV with JS: 11.19.19/CSC & UDS UTD/PMP ok no red flags/thx dmf

## 2018-12-08 NOTE — Telephone Encounter (Signed)
JS-Pt is here and is having her X-Ray done  She also is in agreement for referral for Rheumatology/thx dmf

## 2018-12-15 ENCOUNTER — Other Ambulatory Visit: Payer: Self-pay | Admitting: Family Medicine

## 2018-12-15 NOTE — Telephone Encounter (Signed)
Ok to fill? Last filled 11/09/18

## 2018-12-17 ENCOUNTER — Encounter: Payer: Self-pay | Admitting: Family Medicine

## 2018-12-17 ENCOUNTER — Other Ambulatory Visit: Payer: Self-pay | Admitting: Family Medicine

## 2018-12-17 DIAGNOSIS — M2550 Pain in unspecified joint: Secondary | ICD-10-CM

## 2018-12-17 DIAGNOSIS — R768 Other specified abnormal immunological findings in serum: Secondary | ICD-10-CM

## 2018-12-27 ENCOUNTER — Ambulatory Visit: Payer: Self-pay | Admitting: Licensed Clinical Social Worker

## 2018-12-27 ENCOUNTER — Ambulatory Visit: Payer: Medicare HMO | Admitting: Licensed Clinical Social Worker

## 2019-01-02 ENCOUNTER — Encounter: Payer: Self-pay | Admitting: Family Medicine

## 2019-01-06 ENCOUNTER — Encounter: Payer: Self-pay | Admitting: Family Medicine

## 2019-01-06 ENCOUNTER — Ambulatory Visit: Payer: Medicare HMO | Admitting: Family Medicine

## 2019-01-06 VITALS — BP 104/78 | HR 84 | Temp 98.6°F | Ht 62.0 in | Wt 189.2 lb

## 2019-01-06 DIAGNOSIS — G8929 Other chronic pain: Secondary | ICD-10-CM | POA: Diagnosis not present

## 2019-01-06 DIAGNOSIS — Z23 Encounter for immunization: Secondary | ICD-10-CM | POA: Diagnosis not present

## 2019-01-06 MED ORDER — TRAMADOL HCL 50 MG PO TABS: 50.0000 mg | ORAL_TABLET | Freq: Three times a day (TID) | ORAL | 0 refills | Status: DC | PRN

## 2019-01-06 NOTE — Assessment & Plan Note (Signed)
She had not read the follow message that I wrote to her in response so we went over this together:  Hi Diane [name], I am so saddened to hear that you feel this way but I am so glad you feel comfortable enough to tell me.  I did refer you to rheumatologist as your inflammatory markers did suggest your pain is coming from a rheumatoid process. You have an appointment with Dr. Estanislado Pandy (who is the best), two weeks from today. I am happy to fill out a handcap sticker for your car. In terms of pain, we are so hesitant to give you anything like narcotics that can worsen your fall risks. You are already falling so much. We can certainly try a course of prednisone for inflammation or perhaps tramadol since it is a bit of a weaker narcotic and typically does not cause as many falls. I absolutely do NOT want you in pain. I was trying to provide the best care since if a rheumatologist feels you do have a rheumatoid issue contributing to your pain, they can treat you with medication like methotrexate or humira to get to the source of the pain (both which are outside of a primary doctor's scope of practice). I am however happy to try prednisone and or tramadol for your pain.  Please let know how I can help.    We agreed to start low dose tramadol- discussed sedation and fall precautions. She will keep appt with Dr. Estanislado Pandy and keep me updated. The patient indicates understanding of these issues and agrees with the plan.  >25 minutes spent in face to face time with patient, >50% spent in counselling or coordination of care

## 2019-01-06 NOTE — Patient Instructions (Signed)
Great to see you.  We are starting you are Tramadol 50 mg up to three times daily as needed for severe pain (not to be taken at the same time as trazodone or valium).   Keep your appointment with Dr. Bronson Curb.

## 2019-01-06 NOTE — Progress Notes (Signed)
Subjective:   Patient ID: Diane [name], female    DOB: 11-27-1951, 68 y.o.   MRN: [id number]  Diane [name] is a pleasant 68 y.o. year old female who presents to clinic today with Fall (Patient is here today C/O a fall.  DOI: 1.5.2020.  She fell in front of church on wet grass.  She feels like she may have cracked some ribs but did not go anywhere to be seen. States her whole left side is sore  she has developed a cough as well.) and Numbness (She expressed that she has been quite upset.  Dr. Kathreen Cosier said he would refer her to a Rheumatologist but she has never heard anything.  She has been dealing with numbness in her hands bilaterally since 12.7.19. She called the ortho herself who has her scheduled.  Dr. Raeford Razor told her that she didn't have to come here at all and constantly was looking at his watch.)  on 01/06/2019  HPI:  [place] at home in front of church on wet grass.  She is worried she cracked some ribs.  Also experiencing numbness and was upset as Dr. Raeford Razor that referred her to a rheumatologist but has not hear anything yet about this appointment.  She as a positive ANA  It appears she has a new patient appointment with Dr. Estanislado Pandy scheduled from 01/19/19.  I also received the following message from [name] yesterdayTanja Port,  I need to make an appt. for my pneumonia shot.(per Message)Also, I fell today in front of my church on wet grass.I know I have cracked some ribs but I didn't go anywhere. I want you to continue to be my caregiver but I feel like my needs are not being taken care of. I have had numeness in my hands since Dec.7 that I feel you could have done something about by now. You said you were going to refer me to rheumatologist. Can you not treat me? This has been too long. I also need a handicapped sticker for my car. I have had two major falls in 6 weeks which I think my knees and other joints can be contributed to. I trust you 100%, I always have. I don't want to  see anyone else but I need some help.There are millions of medicines out there. Surely I could be prescribed something. I also know that medical personel are hesitant to call pain meds in. I need something until we can decide together what we need to do. I am sorry this is so long but I have hurt 6weeks. Please help me.   I responded immediately with this response-  Hi Diane [name], I am so saddened to hear that you feel this way but I am so glad you feel comfortable enough to tell me.  I did refer you to rheumatologist as your inflammatory markers did suggest your pain is coming from a rheumatoid process. You have an appointment with Dr. Estanislado Pandy (who is the best), two weeks from today. I am happy to fill out a handcap sticker for your car. In terms of pain, we are so hesitant to give you anything like narcotics that can worsen your fall risks. You are already falling so much. We can certainly try a course of prednisone for inflammation or perhaps tramadol since it is a bit of a weaker narcotic and typically does not cause as many falls. I absolutely do NOT want you in pain. I was trying to provide the best care since if  a rheumatologist feels you do have a rheumatoid issue contributing to your pain, they can treat you with medication like methotrexate or humira to get to the source of the pain (both which are outside of a primary doctor's scope of practice). I am however happy to try prednisone and or tramadol for your pain.  Please let know how I can help.     Current Outpatient Medications on File Prior to Visit  Medication Sig Dispense Refill  . aspirin EC 81 MG tablet Take 1 tablet (81 mg total) by mouth daily. 90 tablet 3  . atorvastatin (LIPITOR) 80 MG tablet Take 1 tablet (80 mg total) by mouth daily. 90 tablet 0  . benazepril-hydrochlorthiazide (LOTENSIN HCT) 20-12.5 MG tablet Take 1 tablet by mouth daily. 30 tablet 3  . budesonide-formoterol (SYMBICORT) 80-4.5 MCG/ACT inhaler Inhale 2 puffs  into the lungs 2 (two) times daily. 1 Inhaler 12  . butalbital-acetaminophen-caffeine (FIORICET WITH CODEINE) 50-325-40-30 MG capsule TAKE ONE TO TWO CAPSULES BY MOUTH DAILY AS NEEDED FOR HEADACHE 30 capsule 5  . cholecalciferol (VITAMIN D) 1000 UNITS tablet Take 1,000 Units by mouth daily.    . diazepam (VALIUM) 5 MG tablet TAKE ONE TABLET BY MOUTH EVERY 8 HOURS AS NEEDED FOR ANXIETY 60 tablet 0  . diclofenac sodium (VOLTAREN) 1 % GEL Apply 2 g 4 (four) times daily topically. 100 g 1  . diltiazem (CARDIZEM CD) 180 MG 24 hr capsule Take 1 capsule (180 mg total) by mouth daily. 90 capsule 0  . fluticasone (FLONASE) 50 MCG/ACT nasal spray Place 2 sprays into both nostrils daily. 16 g 6  . furosemide (LASIX) 20 MG tablet Take 1 tablet (20 mg total) by mouth daily. 90 tablet 3  . PROAIR HFA 108 (90 Base) MCG/ACT inhaler INHALE 1 TO 2 PUFFS INTO THE LUNGS EVERY 6 HOURS AS NEEDED FOR WHEEZING OR SHORTNESS OF BREATH 8.5 g 1  . ranolazine (RANEXA) 500 MG 12 hr tablet TAKE 1 TABLET(500 MG) BY MOUTH TWICE DAILY 180 tablet 0  . sertraline (ZOLOFT) 100 MG tablet Take 1.5 tablets (150 mg total) by mouth daily. 90 tablet 0  . traZODone (DESYREL) 150 MG tablet TAKE 1 TABLET BY MOUTH EVERY NIGHT AT BEDTIME AS NEEDED FOR SLEEP 90 tablet 0  . valACYclovir (VALTREX) 1000 MG tablet TAKE 1 TABLET BY MOUTH TWICE DAILY 10 tablet 0   No current facility-administered medications on file prior to visit.     Allergies  Allergen Reactions  . Fosamax [Alendronate Sodium] Other (See Comments)    GERD and joint pain    Past Medical History:  Diagnosis Date  . Arthritis   . 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 Depression   . Diastolic dysfunction    a. 01/2013 Echo: EF 55-60%, no rwma, Gr1 DD, PASP 85mmHg; b. 04/2017 Echo: EF 65-70%, no rwma, Gr1 DD.  [name] GERD (gastroesophageal reflux disease)   . Hyperlipidemia   . Hypertension   .  Non-obstructive CAD (coronary artery disease)    a. 09/2013 Cath: LM nl, LAD 30p, 76m, LCX nl, RCA 30p, EF 55-60%; b. 04/2017 MV: EF>65%, apical defect - most consistent w/ shifting breast attenuation. Small area of isch cannot be excluded.  [name] PONV (postoperative nausea and vomiting)   . Skin cancer of face 05/2015   basal cell  . Stroke [facility]) 2006   deaf in right  ear    Past Surgical History:  Procedure Laterality Date  . BREAST BIOPSY Left 1984   abcess  . CESAREAN SECTION    . CHOLECYSTECTOMY  1990  . CHONDROPLASTY Left 01/05/2015   Procedure: CHONDROPLASTY;  Surgeon: Yvette Rack., MD;  Location: [place];  Service: Orthopedics;  Laterality: Left;  . KNEE ARTHROSCOPY WITH MEDIAL MENISECTOMY Left 01/05/2015   Procedure: LEFT KNEE ARTHROSCOPY WITH MEDIAL AND LATERAL MENISECTOMY; DEBRIDEMENT LATERAL PATELLA FEMORAL ;  Surgeon: Yvette Rack., MD;  Location: [place];  Service: Orthopedics;  Laterality: Left;  . LEFT HEART CATHETERIZATION WITH CORONARY ANGIOGRAM N/A 10/28/2013   Procedure: LEFT HEART CATHETERIZATION WITH CORONARY ANGIOGRAM;  Surgeon: Larey Dresser, MD;  Location[facility];  Service: Cardiovascular;  Laterality: N/A;  . TOTAL KNEE ARTHROPLASTY Left 07/06/2015   Procedure: TOTAL KNEE ARTHROPLASTY;  Surgeon: Earlie Server, MD;  Location: [place];  Service: Orthopedics;  Laterality: Left;  . TUBAL LIGATION      Family History  Problem Relation Age of Onset  . Healthy Mother   . Hypertension Mother   . Stroke Mother   . Stroke Father   . Heart failure Father   . Heart disease Father        CABG  . Ovarian cancer Paternal Grandmother   . Liver cancer Paternal Grandfather   . Stroke Maternal Grandmother   . Heart failure Maternal Grandmother   . Heart failure Maternal Grandfather   . Colon cancer Neg Hx   . Stomach cancer Neg Hx   . Rectal cancer Neg Hx   . Esophageal cancer Neg Hx   . Breast cancer Neg Hx     Social History    Socioeconomic History  . Marital status: Significant Other    Spouse name: [name]  . Number of children: 8  . Years of education: Not on file  . Highest education level: Not on file  Occupational History  . 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 Financial resource strain: Not on file  . Food insecurity:    Worry: Not on file    Inability: Not on file  . Transportation needs:    Medical: Not on file    Non-medical: Not on file  Tobacco Use  . Smoking status: Never Smoker  . Smokeless tobacco: Never Used  . Tobacco comment: LIVES WITH 2 SMOKERS   Substance and Sexual Activity  . Alcohol use: Yes    Comment: 1 glass of wine per month  . Drug use: No  . Sexual activity: Yes    Birth control/protection: Post-menopausal  Lifestyle  . Physical activity:    Days per week: Not on file    Minutes per session: Not on file  . Stress: Not on file  Relationships  . Social connections:    Talks on phone: Not on file    Gets together: Not on file    Attends religious service: Not on file    Active member of club or organization: Not on file    Attends meetings of clubs or organizations: Not on file    Relationship status: Not on file  . Intimate partner violence:    Fear of current or ex partner: Not on file    Emotionally abused: Not on file    Physically abused: Not on file    Forced sexual activity: Not on file  Other Topics Concern  . Not on file  Social History Narrative   Divorced, but has fiancee.  Lives with fiancee in [place].  Moved here recently from [place], [place].     She 8 children- ages 28- 10.               The PMH, PSH, Social History, Family History, Medications, and allergies have been reviewed in [facility], and have been updated if relevant.   Review of Systems  Constitutional: Negative.   Musculoskeletal: Positive for arthralgias.  Neurological: Positive for numbness. Negative for dizziness,  tremors, seizures, syncope, facial asymmetry, speech difficulty, weakness, light-headedness and headaches.  All other systems reviewed and are negative.      Objective:    BP 104/78 (BP Location: Right Arm, Patient Position: Sitting, Cuff Size: Normal)   Pulse 84   Temp 98.6 F (37 C) (Oral)   Ht 5\' 2"  (1.575 m)   Wt 189 lb 3.2 oz (85.8 kg)   SpO2 97%   BMI 34.61 kg/m    Physical Exam Vitals signs and nursing note reviewed.  Constitutional:      General: She is not in acute distress.    Appearance: Normal appearance. She is not ill-appearing.  HENT:     Head: Normocephalic and atraumatic.     Nose: Nose normal.     Mouth/Throat:     Mouth: Mucous membranes are moist.  Eyes:     Extraocular Movements: Extraocular movements intact.  Neck:     Musculoskeletal: Normal range of motion.  Cardiovascular:     Rate and Rhythm: Normal rate.     Pulses: Normal pulses.  Pulmonary:     Effort: Pulmonary effort is normal.  Musculoskeletal: Normal range of motion.  Skin:    General: Skin is warm and dry.  Neurological:     General: No focal deficit present.     Mental Status: She is alert and oriented to person, place, and time.  Psychiatric:        Mood and Affect: Mood normal.        Behavior: Behavior normal.        Thought Content: Thought content normal.           Assessment & Plan:   Need for pneumococcal vaccination - Plan: Pneumococcal polysaccharide vaccine 23-valent greater than or equal to 2yo subcutaneous/IM No follow-ups on file.
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
## 2019-01-09 ENCOUNTER — Other Ambulatory Visit: Payer: Self-pay | Admitting: Cardiovascular Disease

## 2019-01-11 ENCOUNTER — Ambulatory Visit: Payer: Medicare HMO | Admitting: Licensed Clinical Social Worker

## 2019-01-19 ENCOUNTER — Ambulatory Visit (INDEPENDENT_AMBULATORY_CARE_PROVIDER_SITE_OTHER): Payer: Medicare HMO

## 2019-01-19 ENCOUNTER — Encounter: Payer: Self-pay | Admitting: Rheumatology

## 2019-01-19 ENCOUNTER — Ambulatory Visit (INDEPENDENT_AMBULATORY_CARE_PROVIDER_SITE_OTHER): Payer: Self-pay

## 2019-01-19 ENCOUNTER — Ambulatory Visit: Payer: Medicare HMO | Admitting: Rheumatology

## 2019-01-19 VITALS — BP 120/78 | HR 85 | Resp 13 | Ht 62.25 in | Wt 188.8 lb

## 2019-01-19 DIAGNOSIS — M25551 Pain in right hip: Secondary | ICD-10-CM | POA: Diagnosis not present

## 2019-01-19 DIAGNOSIS — Z8639 Personal history of other endocrine, nutritional and metabolic disease: Secondary | ICD-10-CM

## 2019-01-19 DIAGNOSIS — M25511 Pain in right shoulder: Secondary | ICD-10-CM | POA: Diagnosis not present

## 2019-01-19 DIAGNOSIS — I5032 Chronic diastolic (congestive) heart failure: Secondary | ICD-10-CM

## 2019-01-19 DIAGNOSIS — F4310 Post-traumatic stress disorder, unspecified: Secondary | ICD-10-CM

## 2019-01-19 DIAGNOSIS — M25552 Pain in left hip: Secondary | ICD-10-CM

## 2019-01-19 DIAGNOSIS — M7918 Myalgia, other site: Secondary | ICD-10-CM

## 2019-01-19 DIAGNOSIS — M533 Sacrococcygeal disorders, not elsewhere classified: Secondary | ICD-10-CM | POA: Diagnosis not present

## 2019-01-19 DIAGNOSIS — G8929 Other chronic pain: Secondary | ICD-10-CM

## 2019-01-19 DIAGNOSIS — I10 Essential (primary) hypertension: Secondary | ICD-10-CM

## 2019-01-19 DIAGNOSIS — M79642 Pain in left hand: Secondary | ICD-10-CM

## 2019-01-19 DIAGNOSIS — M79641 Pain in right hand: Secondary | ICD-10-CM | POA: Diagnosis not present

## 2019-01-19 DIAGNOSIS — Z8679 Personal history of other diseases of the circulatory system: Secondary | ICD-10-CM

## 2019-01-19 DIAGNOSIS — Z8673 Personal history of transient ischemic attack (TIA), and cerebral infarction without residual deficits: Secondary | ICD-10-CM

## 2019-01-19 DIAGNOSIS — M810 Age-related osteoporosis without current pathological fracture: Secondary | ICD-10-CM

## 2019-01-19 DIAGNOSIS — E041 Nontoxic single thyroid nodule: Secondary | ICD-10-CM

## 2019-01-19 DIAGNOSIS — M25512 Pain in left shoulder: Secondary | ICD-10-CM

## 2019-01-19 DIAGNOSIS — Z8719 Personal history of other diseases of the digestive system: Secondary | ICD-10-CM

## 2019-01-19 DIAGNOSIS — M2550 Pain in unspecified joint: Secondary | ICD-10-CM

## 2019-01-19 DIAGNOSIS — Z96652 Presence of left artificial knee joint: Secondary | ICD-10-CM | POA: Diagnosis not present

## 2019-01-19 DIAGNOSIS — Z8659 Personal history of other mental and behavioral disorders: Secondary | ICD-10-CM

## 2019-01-19 DIAGNOSIS — R768 Other specified abnormal immunological findings in serum: Secondary | ICD-10-CM

## 2019-01-19 NOTE — Patient Instructions (Signed)
Shoulder Exercises Ask your health care provider which exercises are safe for you. Do exercises exactly as told by your health care provider and adjust them as directed. It is normal to feel mild stretching, pulling, tightness, or discomfort as you do these exercises, but you should stop right away if you feel sudden pain or your pain gets worse.Do not begin these exercises until told by your health care provider. Range of Motion Exercises        These exercises warm up your muscles and joints and improve the movement and flexibility of your shoulder. These exercises also help to relieve pain, numbness, and tingling. These exercises involve stretching your injured shoulder directly. Exercise A: Pendulum 1. Stand near a wall or a surface that you can hold onto for balance. 2. Bend at the waist and let your left / right arm hang straight down. Use your other arm to support you. Keep your back straight and do not lock your knees. 3. Relax your left / right arm and shoulder muscles, and move your hips and your trunk so your left / right arm swings freely. Your arm should swing because of the motion of your body, not because you are using your arm or shoulder muscles. 4. Keep moving your body so your arm swings in the following directions, as told by your health care provider: ? Side to side. ? Forward and backward. ? In clockwise and counterclockwise circles. 5. Continue each motion for __________ seconds, or for as long as told by your health care provider. 6. Slowly return to the starting position. Repeat __________ times. Complete this exercise __________ times a day. Exercise B:Flexion, Standing 1. Stand and hold a broomstick, a cane, or a similar object. Place your hands a little more than shoulder-width apart on the object. Your left / right hand should be palm-up, and your other hand should be palm-down. 2. Keep your elbow straight and keep your shoulder muscles relaxed. Push the stick  down with your healthy arm to raise your left / right arm in front of your body, and then over your head until you feel a stretch in your shoulder. ? Avoid shrugging your shoulder while you raise your arm. Keep your shoulder blade tucked down toward the middle of your back. 3. Hold for __________ seconds. 4. Slowly return to the starting position. Repeat __________ times. Complete this exercise __________ times a day. Exercise C: Abduction, Standing 1. Stand and hold a broomstick, a cane, or a similar object. Place your hands a little more than shoulder-width apart on the object. Your left / right hand should be palm-up, and your other hand should be palm-down. 2. While keeping your elbow straight and your shoulder muscles relaxed, push the stick across your body toward your left / right side. Raise your left / right arm to the side of your body and then over your head until you feel a stretch in your shoulder. ? Do not raise your arm above shoulder height, unless your health care provider tells you to do that. ? Avoid shrugging your shoulder while you raise your arm. Keep your shoulder blade tucked down toward the middle of your back. 3. Hold for __________ seconds. 4. Slowly return to the starting position. Repeat __________ times. Complete this exercise __________ times a day. Exercise D:Internal Rotation 1. Place your left / right hand behind your back, palm-up. 2. Use your other hand to dangle an exercise band, a towel, or a similar object over your shoulder.   Grasp the band with your left / right hand so you are holding onto both ends. 3. Gently pull up on the band until you feel a stretch in the front of your left / right shoulder. ? Avoid shrugging your shoulder while you raise your arm. Keep your shoulder blade tucked down toward the middle of your back. 4. Hold for __________ seconds. 5. Release the stretch by letting go of the band and lowering your hands. Repeat __________ times.  Complete this exercise __________ times a day. Stretching Exercises  These exercises warm up your muscles and joints and improve the movement and flexibility of your shoulder. These exercises also help to relieve pain, numbness, and tingling. These exercises are done using your healthy shoulder to help stretch the muscles of your injured shoulder. Exercise E: Corner Stretch (External Rotation and Abduction) 1. Stand in a doorway with one of your feet slightly in front of the other. This is called a staggered stance. If you cannot reach your forearms to the door frame, stand facing a corner of a room. 2. Choose one of the following positions as told by your health care provider: ? Place your hands and forearms on the door frame above your head. ? Place your hands and forearms on the door frame at the height of your head. ? Place your hands on the door frame at the height of your elbows. 3. Slowly move your weight onto your front foot until you feel a stretch across your chest and in the front of your shoulders. Keep your head and chest upright and keep your abdominal muscles tight. 4. Hold for __________ seconds. 5. To release the stretch, shift your weight to your back foot. Repeat __________ times. Complete this stretch __________ times a day. Exercise F:Extension, Standing 1. Stand and hold a broomstick, a cane, or a similar object behind your back. ? Your hands should be a little wider than shoulder-width apart. ? Your palms should face away from your back. 2. Keeping your elbows straight and keeping your shoulder muscles relaxed, move the stick away from your body until you feel a stretch in your shoulder. ? Avoid shrugging your shoulders while you move the stick. Keep your shoulder blade tucked down toward the middle of your back. 3. Hold for __________ seconds. 4. Slowly return to the starting position. Repeat __________ times. Complete this exercise __________ times a  day. Strengthening Exercises           These exercises build strength and endurance in your shoulder. Endurance is the ability to use your muscles for a long time, even after they get tired. Exercise G:External Rotation 1. Sit in a stable chair without armrests. 2. Secure an exercise band at elbow height on your left / right side. 3. Place a soft object, such as a folded towel or a small pillow, between your left / right upper arm and your body to move your elbow a few inches away (about 10 cm) from your side. 4. Hold the end of the band so it is tight and there is no slack. 5. Keeping your elbow pressed against the soft object, move your left / right forearm out, away from your abdomen. Keep your body steady so only your forearm moves. 6. Hold for __________ seconds. 7. Slowly return to the starting position. Repeat __________ times. Complete this exercise __________ times a day. Exercise H:Shoulder Abduction 1. Sit in a stable chair without armrests, or stand. 2. Hold a __________ weight in your   left / right hand, or hold an exercise band with both hands. 3. Start with your arms straight down and your left / right palm facing in, toward your body. 4. Slowly lift your left / right hand out to your side. Do not lift your hand above shoulder height unless your health care provider tells you that this is safe. ? Keep your arms straight. ? Avoid shrugging your shoulder while you do this movement. Keep your shoulder blade tucked down toward the middle of your back. 5. Hold for __________ seconds. 6. Slowly lower your arm, and return to the starting position. Repeat __________ times. Complete this exercise __________ times a day. Exercise I:Shoulder Extension 1. Sit in a stable chair without armrests, or stand. 2. Secure an exercise band to a stable object in front of you where it is at shoulder height. 3. Hold one end of the exercise band in each hand. Your palms should face each  other. 4. Straighten your elbows and lift your hands up to shoulder height. 5. Step back, away from the secured end of the exercise band, until the band is tight and there is no slack. 6. Squeeze your shoulder blades together as you pull your hands down to the sides of your thighs. Stop when your hands are straight down by your sides. Do not let your hands go behind your body. 7. Hold for __________ seconds. 8. Slowly return to the starting position. Repeat __________ times. Complete this exercise __________ times a day. Exercise J:Standing Shoulder Row 1. Sit in a stable chair without armrests, or stand. 2. Secure an exercise band to a stable object in front of you so it is at waist height. 3. Hold one end of the exercise band in each hand. Your palms should be in a thumbs-up position. 4. Bend each of your elbows to an "L" shape (about 90 degrees) and keep your upper arms at your sides. 5. Step back until the band is tight and there is no slack. 6. Slowly pull your elbows back behind you. 7. Hold for __________ seconds. 8. Slowly return to the starting position. Repeat __________ times. Complete this exercise __________ times a day. Exercise K:Shoulder Press-Ups 1. Sit in a stable chair that has armrests. Sit upright, with your feet flat on the floor. 2. Put your hands on the armrests so your elbows are bent and your fingers are pointing forward. Your hands should be about even with the sides of your body. 3. Push down on the armrests and use your arms to lift yourself off of the chair. Straighten your elbows and lift yourself up as much as you comfortably can. ? Move your shoulder blades down, and avoid letting your shoulders move up toward your ears. ? Keep your feet on the ground. As you get stronger, your feet should support less of your body weight as you lift yourself up. 4. Hold for __________ seconds. 5. Slowly lower yourself back into the chair. Repeat __________ times. Complete  this exercise __________ times a day. Exercise L: Wall Push-Ups 1. Stand so you are facing a stable wall. Your feet should be about one arm-length away from the wall. 2. Lean forward and place your palms on the wall at shoulder height. 3. Keep your feet flat on the floor as you bend your elbows and lean forward toward the wall. 4. Hold for __________ seconds. 5. Straighten your elbows to push yourself back to the starting position. Repeat __________ times. Complete this exercise __________ times   a day. This information is not intended to replace advice given to you by your health care provider. Make sure you discuss any questions you have with your health care provider. Document Released: 10/29/2005 Document Revised: 04/20/2018 Document Reviewed: 08/26/2015 Elsevier Interactive Patient Education  2019 [employer]  

## 2019-01-25 ENCOUNTER — Ambulatory Visit (INDEPENDENT_AMBULATORY_CARE_PROVIDER_SITE_OTHER): Payer: Medicare HMO | Admitting: Licensed Clinical Social Worker

## 2019-01-25 DIAGNOSIS — F3341 Major depressive disorder, recurrent, in partial remission: Secondary | ICD-10-CM

## 2019-01-28 ENCOUNTER — Other Ambulatory Visit: Payer: Self-pay | Admitting: Family Medicine

## 2019-02-01 ENCOUNTER — Other Ambulatory Visit: Payer: Self-pay | Admitting: Family Medicine

## 2019-02-02 ENCOUNTER — Encounter: Payer: Self-pay | Admitting: Family Medicine

## 2019-02-02 ENCOUNTER — Other Ambulatory Visit: Payer: Self-pay | Admitting: Family Medicine

## 2019-02-02 MED ORDER — TRAMADOL HCL 50 MG PO TABS: 50.0000 mg | ORAL_TABLET | Freq: Three times a day (TID) | ORAL | 0 refills | Status: DC | PRN

## 2019-02-02 NOTE — Telephone Encounter (Signed)
Rx sent in to pharmacy yesterday by Dr. Deborra Medina, message sent to patient making her aware.

## 2019-02-04 DIAGNOSIS — M7918 Myalgia, other site: Secondary | ICD-10-CM | POA: Insufficient documentation

## 2019-02-04 DIAGNOSIS — M1611 Unilateral primary osteoarthritis, right hip: Secondary | ICD-10-CM | POA: Insufficient documentation
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
## 2019-02-08 ENCOUNTER — Ambulatory Visit (INDEPENDENT_AMBULATORY_CARE_PROVIDER_SITE_OTHER): Payer: Medicare HMO | Admitting: Licensed Clinical Social Worker

## 2019-02-08 DIAGNOSIS — F3341 Major depressive disorder, recurrent, in partial remission: Secondary | ICD-10-CM | POA: Diagnosis not present

## 2019-02-09 ENCOUNTER — Other Ambulatory Visit: Payer: Self-pay | Admitting: Family Medicine

## 2019-02-14 ENCOUNTER — Other Ambulatory Visit: Payer: Self-pay | Admitting: Family Medicine

## 2019-02-15 MED ORDER — DIAZEPAM 5 MG PO TABS: 5.0000 mg | ORAL_TABLET | Freq: Three times a day (TID) | ORAL | 0 refills | Status: DC | PRN

## 2019-02-18 ENCOUNTER — Ambulatory Visit: Payer: Self-pay | Admitting: Rheumatology

## 2019-02-22 ENCOUNTER — Other Ambulatory Visit: Payer: Self-pay | Admitting: Family Medicine

## 2019-02-22 ENCOUNTER — Ambulatory Visit: Payer: Medicare HMO | Admitting: Licensed Clinical Social Worker

## 2019-02-23 ENCOUNTER — Other Ambulatory Visit: Payer: Self-pay | Admitting: Family Medicine

## 2019-02-23 ENCOUNTER — Other Ambulatory Visit: Payer: Self-pay

## 2019-02-23 ENCOUNTER — Other Ambulatory Visit: Payer: Self-pay | Admitting: Internal Medicine

## 2019-02-23 MED ORDER — RANOLAZINE ER 500 MG PO TB12: ORAL_TABLET | ORAL | 2 refills | Status: DC

## 2019-02-23 MED ORDER — SERTRALINE HCL 100 MG PO TABS: 150.0000 mg | ORAL_TABLET | Freq: Every day | ORAL | 1 refills | Status: DC

## 2019-02-23 NOTE — Telephone Encounter (Signed)
Rx for Ranexa sent to [facility].

## 2019-03-08 ENCOUNTER — Ambulatory Visit: Payer: Self-pay | Admitting: Licensed Clinical Social Worker

## 2019-03-11 ENCOUNTER — Other Ambulatory Visit: Payer: Self-pay | Admitting: Family Medicine

## 2019-03-14 ENCOUNTER — Other Ambulatory Visit: Payer: Self-pay | Admitting: Family Medicine

## 2019-03-14 MED ORDER — DIAZEPAM 5 MG PO TABS: 5.0000 mg | ORAL_TABLET | Freq: Three times a day (TID) | ORAL | 2 refills | Status: DC | PRN

## 2019-03-14 NOTE — Telephone Encounter (Signed)
This was sent in by provider today/thx dmf

## 2019-03-14 NOTE — Telephone Encounter (Signed)
TA-Plz see refill req/per [place] PMP pt is compliant without red flags/Is UTD/plz advise/thx dmf

## 2019-03-20 ENCOUNTER — Other Ambulatory Visit: Payer: Self-pay | Admitting: Family Medicine

## 2019-03-21 MED ORDER — TRAMADOL HCL 50 MG PO TABS: 50.0000 mg | ORAL_TABLET | Freq: Three times a day (TID) | ORAL | 4 refills | Status: DC | PRN

## 2019-03-21 NOTE — Telephone Encounter (Signed)
TA-LOV 1.9.20/CSC & UDS UTD/Per [place] PMP pt is compliant and no red flags/thx dmf

## 2019-03-22 ENCOUNTER — Ambulatory Visit: Payer: Medicare HMO | Admitting: Licensed Clinical Social Worker

## 2019-04-05 ENCOUNTER — Ambulatory Visit: Payer: Medicare HMO | Admitting: Licensed Clinical Social Worker

## 2019-04-06 ENCOUNTER — Other Ambulatory Visit: Payer: Self-pay | Admitting: Family Medicine

## 2019-04-06 ENCOUNTER — Other Ambulatory Visit: Payer: Self-pay

## 2019-04-06 MED ORDER — ATORVASTATIN CALCIUM 80 MG PO TABS: 80.0000 mg | ORAL_TABLET | Freq: Every day | ORAL | 0 refills | Status: DC

## 2019-04-06 MED ORDER — BENAZEPRIL-HYDROCHLOROTHIAZIDE 20-12.5 MG PO TABS: ORAL_TABLET | ORAL | 0 refills | Status: DC

## 2019-04-06 NOTE — Telephone Encounter (Signed)
I spoke with the patient. She is needing her atorvastatin, benazepril & tramadol refilled.  I advised I will refill atorvastain & benazepril, but the tramadol will need to come from Dr. Deborra Medina.   The patient is agreeable and voices understanding.  She would like a # 90 day supply sent to [facility].

## 2019-04-07 NOTE — Telephone Encounter (Signed)
TA-LOV 1.9.20/CSC & UDS are UTD/Per [place] PMP pt is compliant no red flags/thx dmf

## 2019-04-07 NOTE — Telephone Encounter (Signed)
Duplicate request/already eRx'ed in/thx dmf

## 2019-04-14 ENCOUNTER — Other Ambulatory Visit: Payer: Self-pay | Admitting: Family Medicine

## 2019-04-15 NOTE — Telephone Encounter (Signed)
Pt requested Rx refill. Sent to Dr. Aron for review.  

## 2019-04-20 ENCOUNTER — Other Ambulatory Visit: Payer: Self-pay | Admitting: Family Medicine

## 2019-04-25 ENCOUNTER — Encounter: Payer: Self-pay | Admitting: Family Medicine

## 2019-04-25 ENCOUNTER — Ambulatory Visit (INDEPENDENT_AMBULATORY_CARE_PROVIDER_SITE_OTHER): Payer: Medicare HMO | Admitting: Family Medicine

## 2019-04-25 VITALS — BP 147/72 | HR 72 | Wt 186.0 lb

## 2019-04-25 DIAGNOSIS — F4310 Post-traumatic stress disorder, unspecified: Secondary | ICD-10-CM | POA: Diagnosis not present

## 2019-04-25 DIAGNOSIS — F411 Generalized anxiety disorder: Secondary | ICD-10-CM

## 2019-04-25 DIAGNOSIS — F339 Major depressive disorder, recurrent, unspecified: Secondary | ICD-10-CM

## 2019-04-25 MED ORDER — DIAZEPAM 5 MG PO TABS: 5.0000 mg | ORAL_TABLET | Freq: Four times a day (QID) | ORAL | 2 refills | Status: DC | PRN

## 2019-04-25 MED ORDER — SERTRALINE HCL 100 MG PO TABS: ORAL_TABLET | ORAL | 2 refills | Status: DC

## 2019-04-25 MED ORDER — TRAZODONE HCL 150 MG PO TABS: 150.0000 mg | ORAL_TABLET | Freq: Every evening | ORAL | 0 refills | Status: DC | PRN
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
## 2019-04-25 NOTE — Assessment & Plan Note (Signed)
>  15 minutes spent in face to face time with patient, >50% spent in counselling or coordination of care discussing GAD, PTSD, depression. Understandably she is more anxious. Appropriate to increase valium for short period of time but she is aware we will wean her off of this when we can.  Continue zoloft and trazodone at current doses. The patient indicates understanding of these issues and agrees with the plan.

## 2019-05-02 ENCOUNTER — Telehealth: Payer: Self-pay | Admitting: Behavioral Health

## 2019-05-02 NOTE — Telephone Encounter (Signed)
Received Summary of Benefits for Prolia injection. PA is required. Approval was granted for the following dates of service: 04/28/2019-04/27/2020. The estimated out of pocket expense is $265.  Informed patient of the above information. She verbalized understanding and did not have any further questions or concerns. Nurse visit appointment has been scheduled for 05/10/2019 at 2:20 PM.

## 2019-05-06 ENCOUNTER — Encounter: Payer: Self-pay | Admitting: Family Medicine

## 2019-05-06 ENCOUNTER — Other Ambulatory Visit: Payer: Self-pay | Admitting: *Deleted

## 2019-05-06 MED ORDER — ATORVASTATIN CALCIUM 80 MG PO TABS: 80.0000 mg | ORAL_TABLET | Freq: Every day | ORAL | 2 refills | Status: DC

## 2019-05-09 ENCOUNTER — Other Ambulatory Visit: Payer: Self-pay

## 2019-05-09 ENCOUNTER — Telehealth: Payer: Self-pay

## 2019-05-09 MED ORDER — IBANDRONATE SODIUM 150 MG PO TABS: 150.0000 mg | ORAL_TABLET | ORAL | 2 refills | Status: DC

## 2019-05-09 MED ORDER — IBANDRONATE SODIUM 150 MG PO TABS: ORAL_TABLET | ORAL | 2 refills | Status: DC
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
## 2019-05-09 NOTE — Progress Notes (Signed)
Diane [name]/C Prolia/Start Boniva per TA/pt is in agreement/sent to [facility]

## 2019-05-09 NOTE — Telephone Encounter (Signed)
Noted! Thank you

## 2019-05-10 ENCOUNTER — Ambulatory Visit: Payer: Medicare HMO

## 2019-05-26 ENCOUNTER — Encounter: Payer: Self-pay | Admitting: Family Medicine

## 2019-06-01 ENCOUNTER — Encounter: Payer: Self-pay | Admitting: Family Medicine

## 2019-06-06 ENCOUNTER — Encounter: Payer: Self-pay | Admitting: Family Medicine

## 2019-06-27 ENCOUNTER — Other Ambulatory Visit: Payer: Self-pay

## 2019-06-27 MED ORDER — DILTIAZEM HCL ER COATED BEADS 180 MG PO CP24: 180.0000 mg | ORAL_CAPSULE | Freq: Every day | ORAL | 1 refills | Status: DC

## 2019-06-27 NOTE — Telephone Encounter (Signed)
Requested Prescriptions   Signed Prescriptions Disp Refills  . diltiazem (CARDIZEM CD) 180 MG 24 hr capsule 90 capsule 1    Sig: Take 1 capsule (180 mg total) by mouth daily.    Authorizing Provider: Theora Gianotti    Ordering User: Raelene Bott, BRANDY L

## 2019-06-27 NOTE — Telephone Encounter (Signed)
*  STAT* If patient is at the pharmacy, call can be transferred to refill team.   1. Which medications need to be refilled? (please list name of each medication and dose if known)  Cardizem  2. Which pharmacy/location (including street and city if local pharmacy) is medication to be sent to? [facility] in [place]  3. Do they need a 30 day or 90 day supply? [place]

## 2019-06-30 ENCOUNTER — Telehealth: Payer: Self-pay | Admitting: Internal Medicine

## 2019-06-30 NOTE — Telephone Encounter (Signed)
These medications do not have the same indication - she's taking Ranexa for her angina, the only indication I see for her diltiazem may be HTN. However, there is an interaction between the two - diltiazem may increase concentrations of ranolazine. Recommendation is to limit ranolazine dose to a max of 500mg  BID. Pt is currently taking this dose so she is ok to have both prescriptions filled.

## 2019-06-30 NOTE — Telephone Encounter (Signed)
Will check with pharmacist to see if these can be taken together

## 2019-06-30 NOTE — Telephone Encounter (Signed)
Pt c/o medication issue:  1. Name of Medication: diltiazem & ranolazine   2. How are you currently taking this medication (dosage and times per day)? 180 MG 1 daily and 500 MG 1 tablet 2 times daily   3. Are you having a reaction (difficulty breathing--STAT)? no  4. What is your medication issue? Patient went to pharmacy to pick up diltiazem medication refill.  Was told that diltiazem and ranolazine do the same thing and should not be taken together.  Please call to discuss.

## 2019-06-30 NOTE — Telephone Encounter (Signed)
I spoke with pt and gave her information from pharmacist.

## 2019-07-04 ENCOUNTER — Other Ambulatory Visit: Payer: Self-pay | Admitting: Family Medicine

## 2019-07-04 ENCOUNTER — Other Ambulatory Visit: Payer: Self-pay

## 2019-07-04 MED ORDER — TRAMADOL HCL 50 MG PO TABS: 50.0000 mg | ORAL_TABLET | Freq: Three times a day (TID) | ORAL | 0 refills | Status: DC | PRN

## 2019-07-05 ENCOUNTER — Encounter: Payer: Self-pay | Admitting: Family Medicine

## 2019-07-05 MED ORDER — TRAMADOL HCL 50 MG PO TABS: 50.0000 mg | ORAL_TABLET | Freq: Three times a day (TID) | ORAL | 5 refills | Status: DC | PRN

## 2019-07-05 MED ORDER — BENAZEPRIL-HYDROCHLOROTHIAZIDE 20-12.5 MG PO TABS: ORAL_TABLET | ORAL | 0 refills | Status: DC

## 2019-07-08 MED ORDER — BENAZEPRIL-HYDROCHLOROTHIAZIDE 20-12.5 MG PO TABS: 1.0000 | ORAL_TABLET | Freq: Every day | ORAL | 2 refills | Status: DC

## 2019-07-10 ENCOUNTER — Encounter: Payer: Self-pay | Admitting: Family Medicine

## 2019-07-11 ENCOUNTER — Other Ambulatory Visit: Payer: Self-pay | Admitting: Family Medicine

## 2019-07-13 ENCOUNTER — Other Ambulatory Visit: Payer: Self-pay | Admitting: Family Medicine

## 2019-07-13 MED ORDER — DIAZEPAM 5 MG PO TABS: 5.0000 mg | ORAL_TABLET | Freq: Four times a day (QID) | ORAL | 2 refills | Status: DC | PRN

## 2019-07-14 ENCOUNTER — Other Ambulatory Visit: Payer: Self-pay | Admitting: Family Medicine

## 2019-07-14 NOTE — Telephone Encounter (Signed)
LOV 04/25/2019, Please advise Thanks

## 2019-07-14 NOTE — Telephone Encounter (Signed)
TA-LOV: 4.27.20/NOV: Due in October/UDS & CSC are UTD/per [place] PMP pt compliant without red flags/thx dmf

## 2019-07-18 ENCOUNTER — Telehealth: Payer: Self-pay | Admitting: Internal Medicine

## 2019-07-18 NOTE — Telephone Encounter (Signed)
°*  STAT* If patient is at the pharmacy, call can be transferred to refill team.   1. Which medications need to be refilled? (please list name of each medication and dose if known) atorvastitin 80 mg daily  2. Which pharmacy/location (including street and city if local pharmacy) is medication to be sent to? [facility] [address] [place], [place] [address]  3. Do they need a 30 day or 90 day supply? [place]

## 2019-07-19 ENCOUNTER — Other Ambulatory Visit: Payer: Self-pay | Admitting: *Deleted

## 2019-07-19 MED ORDER — ATORVASTATIN CALCIUM 80 MG PO TABS: 80.0000 mg | ORAL_TABLET | Freq: Every day | ORAL | 1 refills | Status: DC

## 2019-07-19 NOTE — Telephone Encounter (Signed)
Requested Prescriptions   Signed Prescriptions Disp Refills  . atorvastatin (LIPITOR) 80 MG tablet 90 tablet 1    Sig: Take 1 tablet (80 mg total) by mouth daily.    Authorizing Provider: Theora Gianotti    Ordering User: Britt Bottom

## 2019-08-08 ENCOUNTER — Other Ambulatory Visit: Payer: Self-pay | Admitting: Family Medicine

## 2019-09-08 ENCOUNTER — Other Ambulatory Visit: Payer: Self-pay | Admitting: Family Medicine

## 2019-09-10 ENCOUNTER — Other Ambulatory Visit: Payer: Self-pay | Admitting: Family Medicine

## 2019-10-11 NOTE — Progress Notes (Signed)
Virtual Visit via Video Note  I connected with patient on 10/12/19 at  1:00 PM EDT by audio enabled telemedicine application and verified that I am speaking with the correct person using two identifiers.   THIS ENCOUNTER IS A VIRTUAL VISIT DUE TO COVID-19 - PATIENT WAS NOT SEEN IN THE OFFICE. PATIENT HAS CONSENTED TO VIRTUAL VISIT / TELEMEDICINE VISIT   Location of patient: home  Location of provider: office  I discussed the limitations of evaluation and management by telemedicine and the availability of in person appointments. The patient expressed understanding and agreed to proceed.   Subjective:   Diane [name] is a 68 y.o. female who presents for Medicare Annual (Subsequent) preventive examination.  Review of Systems:  Home Safety/Smoke Alarms: Feels safe in home. Smoke alarms in place.  Lives w/ daughter.   Female:        Mammo-  Active order     Dexa scan- 08/24/18       CCS- due 01/2020     Objective:     Vitals: BP 138/70 Comment: pt reported vitals   Wt 180 lb (81.6 kg)    BMI 32.66 kg/m   Body mass index is 32.66 kg/m.  Advanced Directives 10/12/2019 09/29/2018 03/27/2017 07/06/2015 06/25/2015 02/23/2015 01/05/2015  Does Patient Have a Medical Advance Directive? No No No No No No No  Would patient like information on creating a medical advance directive? No - Patient declined Yes (MAU/Ambulatory/Procedural Areas - Information given) No - Patient declined Yes - Scientist, clinical (histocompatibility and immunogenetics) given Yes - Scientist, clinical (histocompatibility and immunogenetics) given No - patient declined information No - patient declined information  Pre-existing out of facility DNR order (yellow form or pink MOST form) - - - - - - -    Tobacco Social History   Tobacco Use  Smoking Status Never Smoker  Smokeless Tobacco Never Used  Tobacco Comment   LIVES WITH 2 SMOKERS      Counseling given: Not Answered Comment: LIVES WITH 2 SMOKERS    Clinical Intake:  Pain : No/denies pain    Past Medical History:    Diagnosis Date   Arthritis    CVA (cerebral infarction) 1997   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 01/2013 Echo: EF 55-60%, no rwma, Gr1 DD, PASP 17mmHg; b. 04/2017 Echo: EF 65-70%, no rwma, Gr1 DD.   GERD (gastroesophageal reflux disease)    Hyperlipidemia    Hypertension    Non-obstructive CAD (coronary artery disease)    a. 09/2013 Cath: LM nl, LAD 30p, 76m, LCX nl, RCA 30p, EF 55-60%; b. 04/2017 MV: EF>65%, apical defect - most consistent w/ shifting breast attenuation. Small area of isch cannot be excluded.   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/2015   basal cell   Stroke [facility]) 2006   deaf in right ear   Past Surgical History:  Procedure Laterality Date   BREAST BIOPSY Left 1984   abcess   [place]   CHONDROPLASTY Left 01/05/2015   Procedure: CHONDROPLASTY;  Surgeon: Yvette Rack., MD;  Location: [facility];  Service: Orthopedics;  Laterality: Left;   KNEE ARTHROSCOPY WITH MEDIAL MENISECTOMY Left 01/05/2015   Procedure: LEFT KNEE ARTHROSCOPY WITH MEDIAL AND LATERAL MENISECTOMY; DEBRIDEMENT LATERAL PATELLA FEMORAL ;  Surgeon: Lockie Pares  Brooke Bonito., MD;  Location: [place];  Service: Orthopedics;  Laterality: Left;   LEFT HEART CATHETERIZATION WITH CORONARY ANGIOGRAM N/A 10/28/2013   Procedure: LEFT HEART CATHETERIZATION WITH CORONARY ANGIOGRAM;  Surgeon: Larey Dresser, MD;  Location: [facility] CATH LAB;  Service: Cardiovascular;  Laterality: N/A;   TOTAL KNEE ARTHROPLASTY Left 07/06/2015   Procedure: TOTAL KNEE ARTHROPLASTY;  Surgeon: Earlie Server, MD;  Location: [place];  Service: Orthopedics;  Laterality: Left;   TUBAL LIGATION     Family History  Problem Relation Age of Onset   Healthy Mother    Hypertension Mother    Stroke Mother    Stroke Father    Heart failure Father     Heart disease Father        CABG   Ovarian cancer Paternal Grandmother    Liver cancer Paternal Grandfather    Stroke Maternal Grandmother    Heart failure Maternal Grandmother    Heart failure Maternal Grandfather    Juvenile Diabetes Son    Healthy Son    Healthy Son    Hypertension Son    Healthy Son    Hypertension Son    Healthy Son    Healthy Daughter    Healthy Daughter    Healthy Daughter    Healthy Daughter    Colon cancer Neg Hx    Stomach cancer Neg Hx    Rectal cancer Neg Hx    Esophageal cancer Neg Hx    Breast cancer Neg Hx    Social History   Socioeconomic History   Marital status: Significant Other    Spouse name: [name]   Number of children: 8   Years of education: Not on file   Highest education level: Not on file  Occupational History   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: [place] resource strain: Not on file   Food insecurity    Worry: Not on file    Inability: Not on Lexicographer needs    Medical: Not on file    Non-medical: Not on file  Tobacco Use   Smoking status: Never Smoker   Smokeless tobacco: Never Used   Tobacco comment: LIVES WITH 2 SMOKERS   Substance and Sexual Activity   Alcohol use: Yes    Comment: 1 glass of wine per month   Drug use: No   Sexual activity: Yes    Birth control/protection: Post-menopausal  Lifestyle   Physical activity    Days per week: Not on file    Minutes per session: Not on file   Stress: Not on file  Relationships   Social connections    Talks on phone: Not on file    Gets together: Not on file    Attends religious service: Not on file    Active member of club or organization: Not on file    Attends meetings of clubs or organizations: Not on file    Relationship status: Not on file  Other Topics Concern   Not on file  Social History Narrative   Divorced, but has fiancee.  Lives with  fiancee in [place].  Moved here recently from [place], [place].     She 8 children- ages 39- 23.                Outpatient Encounter Medications as of 10/12/2019  Medication Sig   aspirin EC 81 MG tablet Take 1 tablet (  81 mg total) by mouth daily.   atorvastatin (LIPITOR) 80 MG tablet Take 1 tablet (80 mg total) by mouth daily.   benazepril-hydrochlorthiazide (LOTENSIN HCT) 20-12.5 MG tablet Take 1 tablet by mouth daily.   budesonide-formoterol (SYMBICORT) 80-4.5 MCG/ACT inhaler Inhale 2 puffs into the lungs 2 (two) times daily.   butalbital-acetaminophen-caffeine (FIORICET WITH CODEINE) 50-325-40-30 MG capsule TAKE 1 TO 2 CAPSULES BY MOUTH DAILY AS NEEDED FOR HEADACHE   cholecalciferol (VITAMIN D) 1000 UNITS tablet Take 1,000 Units by mouth daily.   diazepam (VALIUM) 5 MG tablet Take 1 tablet (5 mg total) by mouth every 6 (six) hours as needed. for anxiety   diclofenac sodium (VOLTAREN) 1 % GEL Apply 2 g 4 (four) times daily topically.   fluticasone (FLONASE) 50 MCG/ACT nasal spray SPRAY 2 SPRAYS INTO EACH NOSTRIL EVERY DAY   furosemide (LASIX) 20 MG tablet TAKE 1 TABLET BY MOUTH EVERY DAY   PROAIR HFA 108 (90 Base) MCG/ACT inhaler INHALE 1 TO 2 PUFFS INTO THE LUNGS EVERY 6 HOURS AS NEEDED FOR WHEEZING OR SHORTNESS OF BREATH   ranolazine (RANEXA) 500 MG 12 hr tablet TAKE 1 TABLET(500 MG) BY MOUTH TWICE DAILY   ranolazine (RANEXA) 500 MG 12 hr tablet TAKE ONE TABLET BY MOUTH TWICE A DAY   sertraline (ZOLOFT) 100 MG tablet TAKE 1.5 TABLETS BY MOUTH DAILY   traMADol (ULTRAM) 50 MG tablet Take 1 tablet (50 mg total) by mouth every 8 (eight) hours as needed.   traZODone (DESYREL) 150 MG tablet Take 1 tablet (150 mg total) by mouth at bedtime as needed. for sleep   valACYclovir (VALTREX) 1000 MG tablet TAKE 1 TABLET BY MOUTH TWICE DAILY (Patient taking differently: as needed. )   diltiazem (CARDIZEM CD) 180 MG 24 hr capsule Take 1 capsule (180 mg total) by mouth daily.    [DISCONTINUED] ibandronate (BONIVA) 150 MG tablet Take 1 tab monthly on empty stomach with 8oz H2O, nothing else po and do no lie down for 30 minutes   No facility-administered encounter medications on file as of 10/12/2019.     Activities of Daily Living In your present state of health, do you have any difficulty performing the following activities: 10/12/2019  Hearing? N  Vision? N  Difficulty concentrating or making decisions? N  Walking or climbing stairs? N  Dressing or bathing? N  Doing errands, shopping? N  Preparing Food and eating ? N  Using the Toilet? N  In the past six months, have you accidently leaked urine? N  Do you have problems with loss of bowel control? N  Managing your Medications? N  Managing your Finances? N  Housekeeping or managing your Housekeeping? N  Some recent data might be hidden    Patient Care Team: Lucille Passy, MD as PCP - General (Family Medicine) End, Harrell Gave, MD as PCP - Cardiology (Cardiology)    Assessment:   This is a routine wellness examination for [place]. Physical assessment deferred to PCP.  Exercise Activities and Dietary recommendations   Diet (meal preparation, eat out, water intake, caffeinated beverages, dairy products, fruits and vegetables): well balanced       Goals     Exercise 150 min/wk Moderate Activity       Fall Risk Fall Risk  10/12/2019 09/29/2018 11/12/2017 11/04/2017  Falls in the past year? 0 Yes No No  Number falls in past yr: 0 1 - -  Injury with Fall? 0 No - -  Follow up - Education provided;Falls prevention discussed - -  Depression Screen PHQ 2/9 Scores 10/12/2019 04/25/2019 09/29/2018 08/23/2018  PHQ - 2 Score 0 1 0 4  PHQ- 9 Score - 14 - 20     Cognitive Function Ad8 score reviewed for issues:  Issues making decisions:no  Less interest in hobbies / activities:no  Repeats questions, stories (family complaining):no  Trouble using ordinary gadgets (microwave, computer,  phone):no  Forgets the month or year: no  Mismanaging finances: no  Remembering appts:no  Daily problems with thinking and/or memory:no Ad8 score is=0         Immunization History  Administered Date(s) Administered   Influenza,inj,Quad PF,6+ Mos 02/27/2015, 09/08/2018   Influenza-Unspecified 10/04/2015, 11/13/2016   Pneumococcal Conjugate-13 12/31/2017   Pneumococcal Polysaccharide-23 01/06/2019   Tdap 07/24/2014   Zoster Recombinat (Shingrix) 04/21/2018, 08/05/2018    Screening Tests Health Maintenance  Topic Date Due   INFLUENZA VACCINE  07/30/2019   MAMMOGRAM  08/25/2019   COLONOSCOPY  02/06/2020   TETANUS/TDAP  07/24/2024   DEXA SCAN  Completed   Hepatitis C Screening  Completed   PNA vac Low Risk Adult  Completed       Plan:     Continue to eat heart healthy diet (full of fruits, vegetables, whole grains, lean protein, water--limit salt, fat, and sugar intake) and increase physical activity as tolerated.  Continue doing brain stimulating activities (puzzles, reading, adult coloring books, staying active) to keep memory sharp.     I have personally reviewed and noted the following in the patients chart:    Medical and social history  Use of alcohol, tobacco or illicit drugs   Current medications and supplements  Functional ability and status  Nutritional status  Physical activity  Advanced directives  List of other physicians  Hospitalizations, surgeries, and ER visits in previous 12 months  Vitals  Screenings to include cognitive, depression, and falls  Referrals and appointments  In addition, I have reviewed and discussed with patient certain preventive protocols, quality metrics, and best practice recommendations. A written personalized care plan for preventive services as well as general preventive health recommendations were provided to patient.     Shela Nevin, [place]  10/12/2019

## 2019-10-12 ENCOUNTER — Ambulatory Visit (INDEPENDENT_AMBULATORY_CARE_PROVIDER_SITE_OTHER): Payer: Medicare HMO | Admitting: *Deleted

## 2019-10-12 ENCOUNTER — Encounter: Payer: Self-pay | Admitting: *Deleted

## 2019-10-12 VITALS — BP 138/70 | Wt 180.0 lb

## 2019-10-12 DIAGNOSIS — Z0000 Encounter for general adult medical examination without abnormal findings: Secondary | ICD-10-CM

## 2019-10-12 NOTE — Patient Instructions (Signed)
Continue to eat heart healthy diet (full of fruits, vegetables, whole grains, lean protein, water--limit salt, fat, and sugar intake) and increase physical activity as tolerated.  Continue doing brain stimulating activities (puzzles, reading, adult coloring books, staying active) to keep memory sharp.    Diane [name] , Thank you for taking time to come for your Medicare Wellness Visit. I appreciate your ongoing commitment to your health goals. Please review the following plan we discussed and let me know if I can assist you in the future.   These are the goals we discussed: Goals    . Exercise 150 min/wk Moderate Activity       This is a list of the screening recommended for you and due dates:  Health Maintenance  Topic Date Due  . Flu Shot  07/30/2019  . Mammogram  08/25/2019  . Colon Cancer Screening  02/06/2020  . Tetanus Vaccine  07/24/2024  . DEXA scan (bone density measurement)  Completed  .  Hepatitis C: One time screening is recommended by Center for Disease Control  (CDC) for  adults born from [birth year] through 1964.   Completed  . Pneumonia vaccines  Completed    Health Maintenance After Age 86 After age 4, you are at a higher risk for certain long-term diseases and infections as well as injuries from falls. Falls are a major cause of broken bones and head injuries in people who are older than age 105. Getting regular preventive care can help to keep you healthy and well. Preventive care includes getting regular testing and making lifestyle changes as recommended by your health care provider. Talk with your health care provider about:  Which screenings and tests you should have. A screening is a test that checks for a disease when you have no symptoms.  A diet and exercise plan that is right for you. What should I know about screenings and tests to prevent falls? Screening and testing are the best ways to find a health problem early. Early diagnosis and treatment give you the  best chance of managing medical conditions that are common after age 83. Certain conditions and lifestyle choices may make you more likely to have a fall. Your health care provider may recommend:  Regular vision checks. Poor vision and conditions such as cataracts can make you more likely to have a fall. If you wear glasses, make sure to get your prescription updated if your vision changes.  Medicine review. Work with your health care provider to regularly review all of the medicines you are taking, including over-the-counter medicines. Ask your health care provider about any side effects that may make you more likely to have a fall. Tell your health care provider if any medicines that you take make you feel dizzy or sleepy.  Osteoporosis screening. Osteoporosis is a condition that causes the bones to get weaker. This can make the bones weak and cause them to break more easily.  Blood pressure screening. Blood pressure changes and medicines to control blood pressure can make you feel dizzy.  Strength and balance checks. Your health care provider may recommend certain tests to check your strength and balance while standing, walking, or changing positions.  Foot health exam. Foot pain and numbness, as well as not wearing proper footwear, can make you more likely to have a fall.  Depression screening. You may be more likely to have a fall if you have a fear of falling, feel emotionally low, or feel unable to do activities that you used  to do.  Alcohol use screening. Using too much alcohol can affect your balance and may make you more likely to have a fall. What actions can I take to lower my risk of falls? General instructions  Talk with your health care provider about your risks for falling. Tell your health care provider if: ? You fall. Be sure to tell your health care provider about all falls, even ones that seem minor. ? You feel dizzy, sleepy, or off-balance.  Take over-the-counter and  prescription medicines only as told by your health care provider. These include any supplements.  Eat a healthy diet and maintain a healthy weight. A healthy diet includes low-fat dairy products, low-fat (lean) meats, and fiber from whole grains, beans, and lots of fruits and vegetables. Home safety  Remove any tripping hazards, such as rugs, cords, and clutter.  Install safety equipment such as grab bars in bathrooms and safety rails on stairs.  Keep rooms and walkways well-lit. Activity   Follow a regular exercise program to stay fit. This will help you maintain your balance. Ask your health care provider what types of exercise are appropriate for you.  If you need a cane or walker, use it as recommended by your health care provider.  Wear supportive shoes that have nonskid soles. Lifestyle  Do not drink alcohol if your health care provider tells you not to drink.  If you drink alcohol, limit how much you have: ? 0-1 drink a day for women. ? 0-2 drinks a day for men.  Be aware of how much alcohol is in your drink. In the [place] one drink equals one typical bottle of beer (12 oz), one-half glass of wine (5 oz), or one shot of hard liquor (1 oz).  Do not use any products that contain nicotine or tobacco, such as cigarettes and e-cigarettes. If you need help quitting, ask your health care provider. Summary  Having a healthy lifestyle and getting preventive care can help to protect your health and wellness after age 14.  Screening and testing are the best way to find a health problem early and help you avoid having a fall. Early diagnosis and treatment give you the best chance for managing medical conditions that are more common for people who are older than age 85.  Falls are a major cause of broken bones and head injuries in people who are older than age 23. Take precautions to prevent a fall at home.  Work with your health care provider to learn what changes you can make to  improve your health and wellness and to prevent falls. This information is not intended to replace advice given to you by your health care provider. Make sure you discuss any questions you have with your health care provider. Document Released: 10/28/2017 Document Revised: 04/07/2019 Document Reviewed: 10/28/2017 Elsevier Patient Education  2020 [employer].

## 2019-10-13 ENCOUNTER — Ambulatory Visit (INDEPENDENT_AMBULATORY_CARE_PROVIDER_SITE_OTHER): Payer: Medicare HMO | Admitting: Family Medicine

## 2019-10-13 ENCOUNTER — Other Ambulatory Visit: Payer: Self-pay

## 2019-10-13 ENCOUNTER — Encounter: Payer: Self-pay | Admitting: Family Medicine

## 2019-10-13 DIAGNOSIS — Z79899 Other long term (current) drug therapy: Secondary | ICD-10-CM | POA: Diagnosis not present

## 2019-10-13 MED ORDER — FUROSEMIDE 20 MG PO TABS: 20.0000 mg | ORAL_TABLET | Freq: Every day | ORAL | 0 refills | Status: DC
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
## 2019-10-13 NOTE — Patient Instructions (Addendum)
Health Maintenance Due  Topic Date Due  . INFLUENZA VACCINE  Will discuss during visit. 07/30/2019  . MAMMOGRAM   Pt will schedule an appointment. 08/25/2019     Depression screen PHQ 2/9 10/13/2019 10/12/2019 04/25/2019  Decreased Interest 0 0 0  Down, Depressed, Hopeless 0 0 1  PHQ - 2 Score 0 0 1  Altered sleeping 3 - 3  Tired, decreased energy 1 - 2  Change in appetite 0 - 1  Feeling bad or failure about yourself  0 - 2  Trouble concentrating 1 - 2  Moving slowly or fidgety/restless 1 - 3  Suicidal thoughts 0 - 0  PHQ-9 Score 6 - 14  Difficult doing work/chores Somewhat difficult - -  Some recent data might be hidden

## 2019-10-13 NOTE — Assessment & Plan Note (Signed)
We discussed her medications, made sure had enough refills until she sees her new doctor in 2 weeks.  I wished her well and asked her to keep in touch.  She has signed release of medical records.

## 2019-11-07 ENCOUNTER — Encounter: Payer: Self-pay | Admitting: Internal Medicine

## 2019-12-15 ENCOUNTER — Other Ambulatory Visit: Payer: Self-pay | Admitting: Internal Medicine

## 2019-12-15 MED ORDER — DILTIAZEM HCL ER COATED BEADS 180 MG PO CP24: 180.0000 mg | ORAL_CAPSULE | Freq: Every day | ORAL | 0 refills | Status: DC

## 2019-12-15 NOTE — Telephone Encounter (Signed)
Requested Prescriptions   Signed Prescriptions Disp Refills   diltiazem (CARDIZEM CD) 180 MG 24 hr capsule 90 capsule 0    Sig: Take 1 capsule (180 mg total) by mouth daily.    Authorizing Provider: END, CHRISTOPHER    Ordering User: Raelene Bott, Jonmarc Bodkin L

## 2019-12-15 NOTE — Telephone Encounter (Signed)
°*  STAT* If patient is at the pharmacy, call can be transferred to refill team.   1. Which medications need to be refilled? (please list name of each medication and dose if known) diltiazem 180 MG  2. Which pharmacy/location (including street and city if local pharmacy) is medication to be sent to? [facility] [place] [phone]  3. Do they need a 30 day or 90 day supply? 90 day

## 2019-12-19 ENCOUNTER — Other Ambulatory Visit: Payer: Self-pay | Admitting: Internal Medicine

## 2019-12-25 ENCOUNTER — Other Ambulatory Visit: Payer: Self-pay

## 2019-12-25 ENCOUNTER — Encounter (HOSPITAL_BASED_OUTPATIENT_CLINIC_OR_DEPARTMENT_OTHER): Payer: Self-pay | Admitting: Emergency Medicine

## 2019-12-25 ENCOUNTER — Emergency Department (HOSPITAL_BASED_OUTPATIENT_CLINIC_OR_DEPARTMENT_OTHER)
Admission: EM | Admit: 2019-12-25 | Discharge: 2019-12-26 | Disposition: A | Discharge status: Home / Self Care | Payer: Medicare HMO | Attending: Emergency Medicine | Admitting: Emergency Medicine

## 2019-12-25 DIAGNOSIS — Z96652 Presence of left artificial knee joint: Secondary | ICD-10-CM | POA: Diagnosis not present

## 2019-12-25 DIAGNOSIS — Z20828 Contact with and (suspected) exposure to other viral communicable diseases: Secondary | ICD-10-CM | POA: Diagnosis not present

## 2019-12-25 DIAGNOSIS — Z008 Encounter for other general examination: Secondary | ICD-10-CM | POA: Diagnosis not present

## 2019-12-25 DIAGNOSIS — I2510 Atherosclerotic heart disease of native coronary artery without angina pectoris: Secondary | ICD-10-CM | POA: Insufficient documentation

## 2019-12-25 DIAGNOSIS — F332 Major depressive disorder, recurrent severe without psychotic features: Secondary | ICD-10-CM | POA: Insufficient documentation

## 2019-12-25 DIAGNOSIS — I10 Essential (primary) hypertension: Secondary | ICD-10-CM | POA: Diagnosis not present

## 2019-12-25 DIAGNOSIS — Z8673 Personal history of transient ischemic attack (TIA), and cerebral infarction without residual deficits: Secondary | ICD-10-CM | POA: Diagnosis not present

## 2019-12-25 DIAGNOSIS — T50904A Poisoning by unspecified drugs, medicaments and biological substances, undetermined, initial encounter: Secondary | ICD-10-CM

## 2019-12-25 DIAGNOSIS — T402X1A Poisoning by other opioids, accidental (unintentional), initial encounter: Secondary | ICD-10-CM | POA: Diagnosis not present

## 2019-12-25 DIAGNOSIS — T50901A Poisoning by unspecified drugs, medicaments and biological substances, accidental (unintentional), initial encounter: Secondary | ICD-10-CM

## 2019-12-25 DIAGNOSIS — R519 Headache, unspecified: Secondary | ICD-10-CM | POA: Insufficient documentation

## 2019-12-25 DIAGNOSIS — F1120 Opioid dependence, uncomplicated: Secondary | ICD-10-CM | POA: Insufficient documentation

## 2019-12-25 DIAGNOSIS — T402X4A Poisoning by other opioids, undetermined, initial encounter: Secondary | ICD-10-CM | POA: Diagnosis present

## 2019-12-25 DIAGNOSIS — Z85828 Personal history of other malignant neoplasm of skin: Secondary | ICD-10-CM | POA: Diagnosis not present

## 2019-12-25 LAB — RAPID URINE DRUG SCREEN, HOSP PERFORMED
Amphetamines: NOT DETECTED
Barbiturates: POSITIVE — AB
Benzodiazepines: POSITIVE — AB
Cocaine: NOT DETECTED
Opiates: POSITIVE — AB
Tetrahydrocannabinol: NOT DETECTED

## 2019-12-25 LAB — COMPREHENSIVE METABOLIC PANEL
ALT: 17 U/L (ref 0–44)
AST: 22 U/L (ref 15–41)
Albumin: 4 g/dL (ref 3.5–5.0)
Alkaline Phosphatase: 66 U/L (ref 38–126)
Anion gap: 10 (ref 5–15)
BUN: 11 mg/dL (ref 8–23)
CO2: 25 mmol/L (ref 22–32)
Calcium: 9.1 mg/dL (ref 8.9–10.3)
Chloride: 101 mmol/L (ref 98–111)
Creatinine, Ser: 0.52 mg/dL (ref 0.44–1.00)
GFR calc Af Amer: >60 mL/min (ref >60)
GFR calc non Af Amer: >60 mL/min (ref >60)
Glucose, Bld: 95 mg/dL (ref 70–99)
Potassium: 3.7 mmol/L (ref 3.5–5.1)
Sodium: 136 mmol/L (ref 135–145)
Total Bilirubin: 0.6 mg/dL (ref 0.3–1.2)
Total Protein: 7.2 g/dL (ref 6.5–8.1)

## 2019-12-25 LAB — CBC WITH DIFFERENTIAL/PLATELET
Abs Immature Granulocytes: 0.01 10*3/uL (ref 0.00–0.07)
Basophils Absolute: 0 10*3/uL (ref 0.0–0.1)
Basophils Relative: 0 %
Eosinophils Absolute: 0.1 10*3/uL (ref 0.0–0.5)
Eosinophils Relative: 2 %
HCT: 40.5 % (ref 36.0–46.0)
Hemoglobin: 13.6 g/dL (ref 12.0–15.0)
Immature Granulocytes: 0 %
Lymphocytes Relative: 22 %
Lymphs Abs: 1 10*3/uL (ref 0.7–4.0)
MCH: 33.1 pg (ref 26.0–34.0)
MCHC: 33.6 g/dL (ref 30.0–36.0)
MCV: 98.5 fL (ref 80.0–100.0)
Monocytes Absolute: 0.3 10*3/uL (ref 0.1–1.0)
Monocytes Relative: 6 %
Neutro Abs: 3.2 10*3/uL (ref 1.7–7.7)
Neutrophils Relative %: 70 %
Platelets: 165 10*3/uL (ref 150–400)
RBC: 4.11 MIL/uL (ref 3.87–5.11)
RDW: 11.9 % (ref 11.5–15.5)
WBC: 4.6 10*3/uL (ref 4.0–10.5)
nRBC: 0 % (ref 0.0–0.2)

## 2019-12-25 LAB — URINALYSIS, ROUTINE W REFLEX MICROSCOPIC
Glucose, UA: NEGATIVE mg/dL
Hgb urine dipstick: NEGATIVE
Ketones, ur: 15 mg/dL — AB
Leukocytes,Ua: NEGATIVE
Nitrite: NEGATIVE
Protein, ur: NEGATIVE mg/dL
Specific Gravity, Urine: 1.025 (ref 1.005–1.030)
pH: 6 (ref 5.0–8.0)

## 2019-12-25 LAB — RESPIRATORY PANEL BY RT PCR (FLU A&B, COVID)
Influenza A by PCR: NEGATIVE
Influenza B by PCR: NEGATIVE
SARS Coronavirus 2 by RT PCR: NEGATIVE

## 2019-12-25 LAB — ACETAMINOPHEN LEVEL: Acetaminophen (Tylenol), Serum: <10 ug/mL — ABNORMAL LOW (ref 10–30)

## 2019-12-25 LAB — ETHANOL: Alcohol, Ethyl (B): <10 mg/dL (ref <10)

## 2019-12-25 LAB — SALICYLATE LEVEL: Salicylate Lvl: <7 mg/dL — ABNORMAL LOW (ref 7.0–30.0)

## 2019-12-25 MED ORDER — DILTIAZEM HCL ER COATED BEADS 180 MG PO CP24
180.0000 mg | ORAL_CAPSULE | Freq: Every day | ORAL | Status: DC
  Filled 2019-12-25 (×3): qty 1

## 2019-12-25 MED ORDER — ALUM & MAG HYDROXIDE-SIMETH 200-200-20 MG/5ML PO SUSP: 30.0000 mL | Freq: Four times a day (QID) | ORAL | Status: DC | PRN

## 2019-12-25 MED ORDER — ATORVASTATIN CALCIUM 80 MG PO TABS
80.0000 mg | ORAL_TABLET | Freq: Every day | ORAL | Status: DC
  Filled 2019-12-25 (×3): qty 1

## 2019-12-25 MED ORDER — BENAZEPRIL HCL 20 MG PO TABS
20.0000 mg | ORAL_TABLET | Freq: Every day | ORAL | Status: DC
  Filled 2019-12-25 (×3): qty 1

## 2019-12-25 MED ORDER — SODIUM CHLORIDE 0.9 % IV BOLUS
500.0000 mL | Freq: Once | INTRAVENOUS | Status: AC
  Administered 2019-12-25: 500 mL via INTRAVENOUS

## 2019-12-25 MED ORDER — ASPIRIN EC 81 MG PO TBEC: 81.0000 mg | DELAYED_RELEASE_TABLET | Freq: Every day | ORAL | Status: DC

## 2019-12-25 MED ORDER — RANOLAZINE ER 500 MG PO TB12
500.0000 mg | ORAL_TABLET | Freq: Two times a day (BID) | ORAL | Status: DC
  Filled 2019-12-25 (×5): qty 1

## 2019-12-25 MED ORDER — BENAZEPRIL-HYDROCHLOROTHIAZIDE 20-12.5 MG PO TABS: 1.0000 | ORAL_TABLET | Freq: Every day | ORAL | Status: DC

## 2019-12-25 MED ORDER — HYDROCHLOROTHIAZIDE 12.5 MG PO CAPS
12.5000 mg | ORAL_CAPSULE | Freq: Every day | ORAL | Status: DC
  Filled 2019-12-25 (×3): qty 1

## 2019-12-25 MED ORDER — FUROSEMIDE 20 MG PO TABS: 20.0000 mg | ORAL_TABLET | Freq: Every day | ORAL | Status: DC

## 2019-12-25 MED ORDER — ALBUTEROL SULFATE HFA 108 (90 BASE) MCG/ACT IN AERS: 1.0000 | INHALATION_SPRAY | Freq: Four times a day (QID) | RESPIRATORY_TRACT | Status: DC | PRN

## 2019-12-25 MED ORDER — ACETAMINOPHEN 325 MG PO TABS
650.0000 mg | ORAL_TABLET | ORAL | Status: DC | PRN
  Administered 2019-12-26: 650 mg via ORAL
  Filled 2019-12-25: qty 2

## 2019-12-25 NOTE — Progress Notes (Signed)
Per [facility], pt is more appropriate for gero-psych bed. TTS to seek placement.   Lind Covert, MSW, LCSW Therapeutic Triage Specialist  [phone]

## 2019-12-25 NOTE — ED Notes (Signed)
Spoke with Moishe Spice, [facility] regarding patient placement. This RN informed Mechele Claude that per pt, no medications had been ingested since 12/25. Pt does exhibit some confusion when this RN makes rounds, stating multiple times "why is no one coming in to see me?" ,however patient is aox4. AC JoAnne states she will attempt geri-psych placement with some concern for possible dementia

## 2019-12-25 NOTE — BH Assessment (Signed)
Tele Assessment Note   Patient Name: BRYCE FEVER MRN: [id number] Referring Physician: Margette Fast, MD Location of Patient: [facility] Location of Provider: [facility]  CHARMAINE GESSEL is an 68 y.o. female who presents to the ED voluntarily following an intentional OD of Ascomp with Codeine. Pt admits to the ED that she took the OD "to feel better." However, when TTS asked why she took the OD she responded "I just does not know why I did it." Pt admits she has been feeling depressed. Pt states there is a euphoric feeling when she takes the medication which is why she continued to take more than prescribed. Pt endorses depressive symptoms including feelings of worthlessness, guilt, low mood, difficulty sleeping, and loss of appetite. Pt admits she felt sad ane intentionally ingested at least 10 pills.   TTS spoke with the pt's son, Tammy Sayne [phone], who states his family is very concerned about his mother. He reports the pt has intentionally OD on medication multiple times in the past which is the reason the pt moved out of her daughters home after living with her for 8 months. He states the pt did not know who he was today and did not recall the events that took place on Christmas. He reports that he is concerned for her safety and believes she will kill herself if she does not get help. He reports the pt has a hx of depression.   Ricky Ala, NP recommends inpt tx. [place] to review for possible admission.   Diagnosis: MDD, recurrent, severe, w/o psychosis; Opioid use d/o, severe  Past Medical History:  Past Medical History:  Diagnosis Date  . Arthritis   . 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 Depression   . Diastolic dysfunction    a. 01/2013 Echo: EF 55-60%, no rwma, Gr1 DD, PASP 55mmHg; b. 04/2017 Echo: EF 65-70%, no rwma, Gr1 DD.  [name] GERD (gastroesophageal reflux disease)   .  Hyperlipidemia   . Hypertension   . Non-obstructive CAD (coronary artery disease)    a. 09/2013 Cath: LM nl, LAD 30p, 70m, LCX nl, RCA 30p, EF 55-60%; b. 04/2017 MV: EF>65%, apical defect - most consistent w/ shifting breast attenuation. Small area of isch cannot be excluded.  [name] PONV (postoperative nausea and vomiting)   . Skin cancer of face 05/2015   basal cell  . Stroke [facility]) 2006   deaf in right ear    Past Surgical History:  Procedure Laterality Date  . BREAST BIOPSY Left 1984   abcess  . CESAREAN SECTION    . CHOLECYSTECTOMY  1990  . CHONDROPLASTY Left 01/05/2015   Procedure: CHONDROPLASTY;  Surgeon: Yvette Rack., MD;  Location: [facility];  Service: Orthopedics;  Laterality: Left;  . KNEE ARTHROSCOPY WITH MEDIAL MENISECTOMY Left 01/05/2015   Procedure: LEFT KNEE ARTHROSCOPY WITH MEDIAL AND LATERAL MENISECTOMY; DEBRIDEMENT LATERAL PATELLA FEMORAL ;  Surgeon: Yvette Rack., MD;  Location: [place];  Service: Orthopedics;  Laterality: Left;  . LEFT HEART CATHETERIZATION WITH CORONARY ANGIOGRAM N/A 10/28/2013   Procedure: LEFT HEART CATHETERIZATION WITH CORONARY ANGIOGRAM;  Surgeon: Larey Dresser, MD;  Location: [facility] CATH LAB;  Service: Cardiovascular;  Laterality: N/A;  . TOTAL KNEE ARTHROPLASTY Left 07/06/2015   Procedure: TOTAL KNEE ARTHROPLASTY;  Surgeon: Earlie Server, MD;  Location: [place];  Service:  Orthopedics;  Laterality: Left;  . TUBAL LIGATION      Family History:  Family History  Problem Relation Age of Onset  . Healthy Mother   . Hypertension Mother   . Stroke Mother   . Stroke Father   . Heart failure Father   . Heart disease Father        CABG  . Ovarian cancer Paternal Grandmother   . Liver cancer Paternal Grandfather   . Stroke Maternal Grandmother   . Heart failure Maternal Grandmother   . Heart failure Maternal Grandfather   . Juvenile Diabetes Son   . Healthy Son   . Healthy Son   . Hypertension Son   . Healthy Son    . Hypertension Son   . Healthy Son   . Healthy Daughter   . Healthy Daughter   . Healthy Daughter   . Healthy Daughter   . Colon cancer Neg Hx   . Stomach cancer Neg Hx   . Rectal cancer Neg Hx   . Esophageal cancer Neg Hx   . Breast cancer Neg Hx     Social History:  reports that she has never smoked. She has never used smokeless tobacco. She reports current alcohol use. She reports that she does not use drugs.  Additional Social History:  Alcohol / Drug Use Pain Medications: See MAR Prescriptions: See MAR Over the Counter: See MAR History of alcohol / drug use?: No history of alcohol / drug abuse  CIWA: CIWA-Ar BP: 136/78 Pulse Rate: 88 COWS:    Allergies:  Allergies  Allergen Reactions  . Boniva [Ibandronic Acid]   . Fosamax [Alendronate Sodium] Other (See Comments)    GERD and joint pain  . Prolia [Denosumab] Other (See Comments)    Joint pain and in bed for 3 days    Home Medications: (Not in a hospital admission)   OB/GYN Status:  No LMP recorded. Patient is postmenopausal.  General Assessment Data Location of Assessment: [place] TTS Assessment: In system Is this a Tele or Face-to-Face Assessment?: Tele Assessment Is this an Initial Assessment or a Re-assessment for this encounter?: Initial Assessment Patient Accompanied by:: N/A Language Other than English: No Living Arrangements: Other (Comment) What gender do you identify as?: Female Marital status: Divorced Pregnancy Status: No Living Arrangements: Children Can pt return to current living arrangement?: Yes Admission Status: Voluntary Is patient capable of signing voluntary admission?: Yes Referral Source: Self/Family/Friend Insurance type: [place] Living Arrangements: Children Name of Psychiatrist: none Name of Therapist: none  Education Status Is patient currently in school?: No Is the patient employed, unemployed or receiving disability?:  (SSI)  Risk to self with the past 6 months Suicidal Ideation: No Has patient been a risk to self within the past 6 months prior to admission? : Yes Suicidal Intent: No Has patient had any suicidal intent within the past 6 months prior to admission? : No Is patient at risk for suicide?: Yes(inadverently) Suicidal Plan?: No Has patient had any suicidal plan within the past 6 months prior to admission? : No Access to Means: Yes Specify Access to Suicidal Means: pt has access to medication What has been your use of drugs/alcohol within the last 12 months?: intentional OD of Ascomp with Codeine Previous Attempts/Gestures: Yes How many times?: 1 Other Self Harm Risks: hx of OD on medication per son Triggers for Past Attempts: None known Intentional Self Injurious Behavior: None Family Suicide History: No  Recent stressful life event(s): Other (Comment)(OD of meds) Persecutory voices/beliefs?: No Depression: Yes Depression Symptoms: Insomnia, Loss of interest in usual pleasures, Feeling worthless/self pity, Fatigue, Guilt Substance abuse history and/or treatment for substance abuse?: Yes Suicide prevention information given to non-admitted patients: Not applicable  Risk to Others within the past 6 months Homicidal Ideation: No Does patient have any lifetime risk of violence toward others beyond the six months prior to admission? : No Thoughts of Harm to Others: No Current Homicidal Intent: No Current Homicidal Plan: No Access to Homicidal Means: No History of harm to others?: No Assessment of Violence: None Noted Does patient have access to weapons?: No Criminal Charges Pending?: No Does patient have a court date: No Is patient on probation?: No  Psychosis Hallucinations: None noted Delusions: None noted  Mental Status Report Appearance/Hygiene: In hospital gown Eye Contact: Good Motor Activity: Freedom of movement Speech: Slow, Slurred Level of Consciousness: Alert Mood:  Depressed, Sullen Affect: Depressed, Sad, Sullen Anxiety Level: None Thought Processes: Relevant, Coherent Judgement: Impaired Orientation: Person, Place, Time Obsessive Compulsive Thoughts/Behaviors: None  Cognitive Functioning Concentration: Normal Memory: Remote Intact, Recent Impaired Is patient IDD: No Insight: Poor Impulse Control: Poor Appetite: Poor Have you had any weight changes? : Loss Amount of the weight change? (lbs): 5 lbs Sleep: Decreased Total Hours of Sleep: 3 Vegetative Symptoms: None  ADLScreening [facility]) Patient's cognitive ability adequate to safely complete daily activities?: Yes Patient able to express need for assistance with ADLs?: Yes Independently performs ADLs?: Yes (appropriate for developmental age)  Prior Inpatient Therapy Prior Inpatient Therapy: No  Prior Outpatient Therapy Prior Outpatient Therapy: Yes Prior Therapy Dates: 2019 Prior Therapy Facilty/Provider(s): unable to recall Reason for Treatment: depression Does patient have an ACCT team?: No Does patient have Intensive In-House Services?  : No Does patient have [facility] services? : No Does patient have P4CC services?: No  ADL Screening (condition at time of admission) Patient's cognitive ability adequate to safely complete daily activities?: Yes Is the patient deaf or have difficulty hearing?: No Does the patient have difficulty seeing, even when wearing glasses/contacts?: No Does the patient have difficulty concentrating, remembering, or making decisions?: No Patient able to express need for assistance with ADLs?: Yes Does the patient have difficulty dressing or bathing?: No Independently performs ADLs?: Yes (appropriate for developmental age) Does the patient have difficulty walking or climbing stairs?: No Weakness of Legs: None Weakness of Arms/Hands: None  Home Assistive Devices/Equipment Home Assistive Devices/Equipment: None    Abuse/Neglect  Assessment (Assessment to be complete while patient is alone) Abuse/Neglect Assessment Can Be Completed: Yes Physical Abuse: Denies Verbal Abuse: Denies Sexual Abuse: Denies Exploitation of patient/patient's resources: Denies Self-Neglect: Denies     Regulatory affairs officer (For Healthcare) Does Patient Have a Medical Advance Directive?: Yes Type of Advance Directive: Healthcare Power of Attorney(Son Minna Behmer) [place] in Chart?: No - copy requested Would patient like information on creating a medical advance directive?: Yes (ED - Information included in AVS)          Disposition: Ricky Ala, NP recommends inpt tx. [place] to review for possible admission.    Disposition Initial Assessment Completed for this Encounter: Yes Disposition of Patient: Admit Type of inpatient treatment program: Adult Patient refused recommended treatment: No  This service was provided via telemedicine using a 2-way, interactive audio and video technology.  Names of all persons participating in this telemedicine service and their role in this encounter. Name:  Sailer Tulk  D[place] Role: Patient  Name: Andre Lefort Role: Son, [place]  Name: Lind Covert Role: TTS       Lyanne Co 12/25/2019 8:01 PM

## 2019-12-25 NOTE — Progress Notes (Signed)
Diane Ala, NP recommends inpt tx. [place] to review for possible admission. EDP Long, Wonda Olds, MD and pt's nurse Derrek Gu, RN have been advised.  Lind Covert, MSW, LCSW Therapeutic Triage Specialist  [phone]

## 2019-12-25 NOTE — ED Notes (Signed)
Pt reports taking "more of prescribed medication than I should have". Pt denies SI, reports just "wanting to be happy for Christmas.

## 2019-12-25 NOTE — ED Notes (Signed)
Diane [name] [phone]

## 2019-12-25 NOTE — ED Notes (Signed)
TTS call received

## 2019-12-25 NOTE — ED Notes (Signed)
Pt reports medications are taken in the mornings.

## 2019-12-25 NOTE — ED Triage Notes (Signed)
From home, , recently moved in with son, was prescribed ascomp with codeine on 12/22, h/o substance abuse, over the past 4 days, hard to arouse, no eating or drinking, prescription was for 100, currently only 40 in bottle, pt states she has been taking 10 at a time. No meds today, son took pills away from her yesterday. Alert and oriented to person per EMS, dizzy when standing
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
## 2019-12-26 ENCOUNTER — Encounter (HOSPITAL_BASED_OUTPATIENT_CLINIC_OR_DEPARTMENT_OTHER): Payer: Self-pay | Admitting: Registered Nurse

## 2019-12-26 DIAGNOSIS — T50901A Poisoning by unspecified drugs, medicaments and biological substances, accidental (unintentional), initial encounter: Secondary | ICD-10-CM | POA: Diagnosis not present

## 2019-12-26 NOTE — Consult Note (Signed)
[facility] Psych ED Discharge  12/26/2019 9:58 AM Diane [name]  MRN:  [id number] Principal Problem: Accidental overdose Discharge Diagnoses: Principal Problem:   Accidental overdose   Subjective: Diane [name], 68 y.o., female patient seen via tele psych by this provider, Dr. Dwyane Dee; and chart reviewed on 12/26/19.  On evaluation Diane [name] reports she was brought to the hospital because she took more of her pain pills that she was supposed to.  Patient states that she has pain pills to help with migraine headaches and that she had took 2 earlier but did not help and later on took 3 more.  States that her son but something was wrong and had a brought to the emergency room.  Patient reports she moved in with her son about 2 months ago until she can find a place of her own in [place].  Reports she was living in [place] but wanted to move back to [place].  Patient reports that she does have a history of anxiety and depression and has been treated by her primary care provider; but it 1 time a couple years ago when she was residing in [place] she saw a Melinda Crutch for therapy and would like to get set up to see Ms. Witt again.  Patient denies any other prior psychiatric history.  Patient also denies prior suicide attempt or psychiatric hospitalization.  At this time patient states she is feeling fine.  Patient denies suicidal/self-harm/homicidal ideations, psychosis, and paranoia.  Patient gave permission to speak with her son for collateral information Diane [name] at [phone]. During evaluation Diane [name] is alert/oriented x 4; calm/cooperative; and mood is congruent with affect.  She does not appear to be responding to internal/external stimuli or delusional thoughts.  Patient denies suicidal/self-harm/homicidal ideation, psychosis, and paranoia.  Patient answered question appropriately.  Spoke to patient's son who has no problem with patient coming home does not feel  patient is a harm to herself.  States he would be able to pick patient up in about 30 minutes.  Patient son was also explained that follow-up with Almyra Free with information will be provided on patient's AVS.   Total Time spent with patient: 45 minutes  Past Psychiatric History: Patient denies prior psychiatric history other than anxiety/depression that is being treated by her primary care provider.  Past Medical History:  Past Medical History:  Diagnosis Date  . Arthritis   . 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 Depression   . Diastolic dysfunction    a. 01/2013 Echo: EF 55-60%, no rwma, Gr1 DD, PASP 14mmHg; b. 04/2017 Echo: EF 65-70%, no rwma, Gr1 DD.  [name] GERD (gastroesophageal reflux disease)   . Hyperlipidemia   . Hypertension   . Non-obstructive CAD (coronary artery disease)    a. 09/2013 Cath: LM nl, LAD 30p, 45m, LCX nl, RCA 30p, EF 55-60%; b. 04/2017 MV: EF>65%, apical defect - most consistent w/ shifting breast attenuation. Small area of isch cannot be excluded.  [name] PONV (postoperative nausea and vomiting)   . Skin cancer of face 05/2015   basal cell  . Stroke [facility]) 2006   deaf in right ear    Past Surgical History:  Procedure Laterality Date  . BREAST BIOPSY Left 1984   abcess  . CESAREAN SECTION    . CHOLECYSTECTOMY  1990  . CHONDROPLASTY Left 01/05/2015   Procedure: CHONDROPLASTY;  Surgeon:  Yvette Rack., MD;  Location: [place];  Service: Orthopedics;  Laterality: Left;  . KNEE ARTHROSCOPY WITH MEDIAL MENISECTOMY Left 01/05/2015   Procedure: LEFT KNEE ARTHROSCOPY WITH MEDIAL AND LATERAL MENISECTOMY; DEBRIDEMENT LATERAL PATELLA FEMORAL ;  Surgeon: Yvette Rack., MD;  Location: [place];  Service: Orthopedics;  Laterality: Left;  . LEFT HEART CATHETERIZATION WITH CORONARY ANGIOGRAM N/A 10/28/2013   Procedure: LEFT HEART CATHETERIZATION WITH CORONARY ANGIOGRAM;   Surgeon: Larey Dresser, MD;  [facility] C[facility];  Service: Cardiovascular;  Laterality: N/A;  . TOTAL KNEE ARTHROPLASTY Left 07/06/2015   Procedure: TOTAL KNEE ARTHROPLASTY;  Surgeon: Earlie Server, MD;  Location: [place];  Service: Orthopedics;  Laterality: Left;  . TUBAL LIGATION     Family History:  Family History  Problem Relation Age of Onset  . Healthy Mother   . Hypertension Mother   . Stroke Mother   . Stroke Father   . Heart failure Father   . Heart disease Father        CABG  . Ovarian cancer Paternal Grandmother   . Liver cancer Paternal Grandfather   . Stroke Maternal Grandmother   . Heart failure Maternal Grandmother   . Heart failure Maternal Grandfather   . Juvenile Diabetes Son   . Healthy Son   . Healthy Son   . Hypertension Son   . Healthy Son   . Hypertension Son   . Healthy Son   . Healthy Daughter   . Healthy Daughter   . Healthy Daughter   . Healthy Daughter   . Colon cancer Neg Hx   . Stomach cancer Neg Hx   . Rectal cancer Neg Hx   . Esophageal cancer Neg Hx   . Breast cancer Neg Hx    Family Psychiatric  History: Denies Social History:  Social History   Substance and Sexual Activity  Alcohol Use Yes   Comment: 1 glass of wine per month     Social History   Substance and Sexual Activity  Drug Use No    Social History   Socioeconomic History  . Marital status: Significant Other    Spouse name: Diane [name]  . Number of children: 8  . Years of education: Not on file  . Highest education level: Not on file  Occupational History  . 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 Smoking status: Never Smoker  . Smokeless tobacco: Never Used  . Tobacco comment: LIVES WITH 2 SMOKERS   Substance and Sexual Activity  . Alcohol use: Yes    Comment: 1 glass of wine per month  . Drug use: No  . Sexual activity: Yes    Birth control/protection: Post-menopausal  Other Topics Concern  . Not on  file  Social History Narrative   Divorced, but has fiancee.  Lives with fiancee in [place].  Moved here recently from [place], [place].     She 8 children- ages 73- 30.               Social Determinants of Health   Financial Resource Strain:   . Difficulty of Paying Living Expenses: Not on file  Food Insecurity:   . Worried About Charity fundraiser in the Last Year: Not on file  . Ran Out of Food in the Last Year: Not on file  Transportation Needs:   . Lack of Transportation (Medical): Not  on file  . Lack of Transportation (Non-Medical): Not on file  Physical Activity:   . Days of Exercise per Week: Not on file  . Minutes of Exercise per Session: Not on file  Stress:   . Feeling of Stress : Not on file  Social Connections:   . Frequency of Communication with Friends and Family: Not on file  . Frequency of Social Gatherings with Friends and Family: Not on file  . Attends Religious Services: Not on file  . Active Member of Clubs or Organizations: Not on file  . Attends Archivist Meetings: Not on file  . Marital Status: Not on file    Has this patient used any form of tobacco in the last 30 days? (Cigarettes, Smokeless Tobacco, Cigars, and/or Pipes) Prescription not provided because: Patient does not use tobacco products  Current Medications: Current Facility-Administered Medications  Medication Dose Route Frequency Provider Last Rate Last Admin  . acetaminophen (TYLENOL) tablet 650 mg  650 mg Oral Q4H PRN Margette Fast, MD   650 mg at 12/26/19 [id number]  . albuterol (VENTOLIN HFA) 108 (90 Base) MCG/ACT inhaler 1-2 puff  1-2 puff Inhalation Q6H PRN Long, Wonda Olds, MD      . alum & mag hydroxide-simeth (MAALOX/MYLANTA) 200-200-20 MG/5ML suspension 30 mL  30 mL Oral Q6H PRN Long, Wonda Olds, MD      . aspirin EC tablet 81 mg  81 mg Oral Daily Long, Wonda Olds, MD      . atorvastatin (LIPITOR) tablet 80 mg  80 mg Oral Daily Long, Wonda Olds, MD      . benazepril (LOTENSIN)  tablet 20 mg  20 mg Oral Daily Long, Wonda Olds, MD       And  . hydrochlorothiazide (MICROZIDE) capsule 12.5 mg  12.5 mg Oral Daily Long, Wonda Olds, MD      . diltiazem (CARDIZEM CD) 24 hr capsule 180 mg  180 mg Oral Daily Long, Wonda Olds, MD      . furosemide (LASIX) tablet 20 mg  20 mg Oral Daily Long, Wonda Olds, MD      . ranolazine (RANEXA) 12 hr tablet 500 mg  500 mg Oral BID Long, Wonda Olds, MD       Current Outpatient Medications  Medication Sig Dispense Refill  . aspirin EC 81 MG tablet Take 1 tablet (81 mg total) by mouth daily. 90 tablet 3  . atorvastatin (LIPITOR) 80 MG tablet Take 1 tablet (80 mg total) by mouth daily. 90 tablet 1  . benazepril-hydrochlorthiazide (LOTENSIN HCT) 20-12.5 MG tablet Take 1 tablet by mouth daily. 90 tablet 2  . budesonide-formoterol (SYMBICORT) 80-4.5 MCG/ACT inhaler Inhale 2 puffs into the lungs 2 (two) times daily. 1 Inhaler 12  . butalbital-acetaminophen-caffeine (FIORICET WITH CODEINE) 50-325-40-30 MG capsule TAKE 1 TO 2 CAPSULES BY MOUTH DAILY AS NEEDED FOR HEADACHE 30 capsule 3  . cholecalciferol (VITAMIN D) 1000 UNITS tablet Take 1,000 Units by mouth daily.    . diazepam (VALIUM) 5 MG tablet Take 1 tablet (5 mg total) by mouth every 6 (six) hours as needed. for anxiety 90 tablet 2  . diclofenac sodium (VOLTAREN) 1 % GEL Apply 2 g 4 (four) times daily topically. 100 g 1  . diltiazem (CARDIZEM CD) 180 MG 24 hr capsule Take 1 capsule (180 mg total) by mouth daily. 90 capsule 0  . fluticasone (FLONASE) 50 MCG/ACT nasal spray SPRAY 2 SPRAYS INTO EACH NOSTRIL EVERY DAY 16 mL 5  . furosemide (LASIX)  20 MG tablet Take 1 tablet (20 mg total) by mouth daily. 90 tablet 0  . PROAIR HFA 108 (90 Base) MCG/ACT inhaler INHALE 1 TO 2 PUFFS INTO THE LUNGS EVERY 6 HOURS AS NEEDED FOR WHEEZING OR SHORTNESS OF BREATH 8.5 g 1  . ranolazine (RANEXA) 500 MG 12 hr tablet TAKE 1 TABLET(500 MG) BY MOUTH TWICE DAILY 180 tablet 2  . ranolazine (RANEXA) 500 MG 12 hr tablet TAKE ONE  TABLET BY MOUTH TWICE A DAY 180 tablet 1  . sertraline (ZOLOFT) 100 MG tablet TAKE 1.5 TABLETS BY MOUTH DAILY 90 tablet 1  . traMADol (ULTRAM) 50 MG tablet Take 1 tablet (50 mg total) by mouth every 8 (eight) hours as needed. 60 tablet 5  . traZODone (DESYREL) 150 MG tablet Take 1 tablet (150 mg total) by mouth at bedtime as needed. for sleep 90 tablet 0  . valACYclovir (VALTREX) 1000 MG tablet TAKE 1 TABLET BY MOUTH TWICE DAILY (Patient taking differently: as needed. ) 10 tablet 0   PTA Medications: (Not in a hospital admission)   Musculoskeletal: Strength & Muscle Tone: within normal limits Gait & Station: normal Patient leans: N/A  Psychiatric Specialty Exam: Physical Exam Vitals and nursing note reviewed.  Constitutional:      Appearance: Normal appearance. She is obese.  Pulmonary:     Effort: Pulmonary effort is normal.  Musculoskeletal:     Cervical back: Normal range of motion.  Neurological:     Mental Status: She is alert.  Psychiatric:        Mood and Affect: Mood normal.        Behavior: Behavior normal.        Thought Content: Thought content normal.        Judgment: Judgment normal.     Review of Systems  Neurological: Negative for headaches. Light-headedness: Denies at this time.  Has a history and medication to treat.  Patient instructed to take medication as ordered by provider.  Psychiatric/Behavioral: Negative for agitation, behavioral problems, confusion, decreased concentration and hallucinations. Self-injury: Denies. Sleep disturbance: Denies. Suicidal ideas: Denies also denies prior history of suicide attempt. Nervous/anxious: Chronic history treated by PCP.   All other systems reviewed and are negative.   Blood pressure (!) 155/76, pulse 82, temperature 98.2 F (36.8 C), temperature source Oral, resp. rate 19, height 5\' 2"  (1.575 m), weight 81.6 kg, SpO2 95 %.Body mass index is 32.92 kg/m.  General Appearance: Casual  Eye Contact:  Good  Speech:   Clear and Coherent and Normal Rate  Volume:  Normal  Mood:  " Fine."  Appropriate  Affect:  Appropriate and Congruent  Thought Process:  Coherent, Goal Directed and Descriptions of Associations: Intact  Orientation:  Full (Time, Place, and Person)  Thought Content:  WDL  Suicidal Thoughts:  No  Homicidal Thoughts:  No  Memory:  Immediate;   Good Recent;   Good  Judgement:  Intact  Insight:  Good and Present  Psychomotor Activity:  Normal  Concentration:  Concentration: Good and Attention Span: Good  Recall:  Good  Fund of Knowledge:  Good  Language:  Good  Akathisia:  No  Handed:  Right  AIMS (if indicated):     Assets:  Communication Skills Desire for Improvement Housing Resilience Social Support  ADL's:  Intact  Cognition:  WNL  Sleep:        Demographic Factors:  Caucasian  Loss Factors: NA  Historical Factors: NA  Risk Reduction Factors:   Sense of  responsibility to family, Religious beliefs about death, Living with another person, especially a relative and Positive social support  Continued Clinical Symptoms:  Chronic Pain  Cognitive Features That Contribute To Risk:  None    Suicide Risk:  Minimal: No identifiable suicidal ideation.  Patients presenting with no risk factors but with morbid ruminations; may be classified as minimal risk based on the severity of the depressive symptoms  Follow-up Information    Sonda Primes, LCSW. Call.   Specialty: Licensed Clinical Social Worker Why: Call to set up an appointment Contact information: [address] [place] [id number] [phone]           Plan Of Care/Follow-up recommendations:  Activity:  As tolerated Diet:  Heart healthy   Disposition: Patient has been psychiatrically cleared No evidence of imminent risk to self or others at present.   Patient does not meet criteria for psychiatric inpatient admission. Supportive therapy provided about ongoing stressors. Discussed  crisis plan, support from social network, calling 911, coming to the Emergency Department, and calling Suicide Hotline.   Spoke with Dr. Vallery Ridge and informed of recommendation and disposition.  Diane Wiesman, NP 12/26/2019, 9:58 AM

## 2019-12-26 NOTE — ED Notes (Signed)
  Attempted to call patients son Diane [name] with no response.  Phone rings 8-10x and cuts off.  No available voicemail.  Agricultural consultant notified.

## 2019-12-26 NOTE — Discharge Instructions (Addendum)
1.  Follow-up with your family doctor soon as possible for recheck.  Make arrangements to see an outpatient psychiatric counselor. 2.  Return to the emergency department if you have any thoughts of hurting yourself or killing yourself or injuring or hurting others. 3.  Address any issues of medication misuse or overuse with your doctor.

## 2019-12-26 NOTE — ED Notes (Signed)
Pts son came and picked up her purse and left pts phone charger for her.  Charger taken to her room.

## 2019-12-26 NOTE — ED Notes (Signed)
Pt reports mild headache, given tylenol as ordered, resting quietly with lights turned low.

## 2019-12-26 NOTE — ED Notes (Signed)
  Attempted to call patients son Leroy Sea to come pick up patients belongings and prescription medications.  He did not pick up and it did not go to a voicemail.  Will attempt one more call before shift change.

## 2019-12-26 NOTE — ED Provider Notes (Signed)
Patient has been cleared by psychiatry.  They have also talked to her son.  They advised that he is ready to come and pick the patient up in 30 minutes.  I spoke to the patient.  She feels safe going home.  She has no complaints. Physical Exam  BP (!) 152/80 (BP Location: Left Arm)   Pulse 74   Temp 98.2 F (36.8 C) (Oral)   Resp 18   Ht 5\' 2"  (1.575 m)   Wt 81.6 kg   SpO2 94%   BMI 32.92 kg/m   Physical Exam Constitutional:      Comments: Alert nontoxic and clinically well in appearance.  No respiratory distress.  Mental status is normal.  Eyes:     Extraocular Movements: Extraocular movements intact.  Pulmonary:     Effort: Pulmonary effort is normal.  Neurological:     General: No focal deficit present.     Mental Status: She is oriented to person, place, and time.     Coordination: Coordination normal.  Psychiatric:        Mood and Affect: Mood normal.     ED Course/Procedures     Procedures  MDM  Patient will be discharged to go home with her son.  Return precautions reviewed.       Charlesetta Shanks, MD 12/26/19 1002

## 2019-12-26 NOTE — ED Notes (Signed)
Pt to take home medications once return home.  Son en route to pick up patient for discharge.

## 2019-12-26 NOTE — ED Notes (Addendum)
TTS in progress 

## 2019-12-26 NOTE — ED Notes (Signed)
  Pharmacy consulted about patient medications not available in pyxis.  Pharmacy stated medications will be sent over later this morning before next dose is due.

## 2019-12-26 NOTE — ED Notes (Addendum)
Spoke with pt's son Suezanne Jacquet and requested that he come and get pt's purse and belongings. He is aware that this is locked in security. Pt is ok with her son taking her items (she resides with him). Pt was given a muffin and juice for breakfast.

## 2019-12-26 NOTE — ED Notes (Addendum)
Pt's belongings found in department and secured. Inventoried money, pills and valuables with two other RNs present (CMaynard Equities trader). Pt has coach purse and wallet, iphone, $10 bill, 7 quarters, 7 nickels, 11 dimes, 16 pennies, 2 social security cards for patient and "walter hipps", [place] DL, victoria's secret credit card "[phone]", masertcard"[id number]", capital one card "0572" visa "3620", visa "9814". Pt also has 60 tabs ascomp with codeine (previous note states 40 tabs, however incorrect), 75 tramadol tabs, 12 diazepam tabs.   Attempting to notify patient's family to pick up purse at this time.

## 2019-12-26 NOTE — ED Notes (Signed)
Pt incont of a small amount of stool after getting up to use the restroom. Assisted pt with peri care. Provided pt with mess underwear and paper scrubs.

## 2020-01-16 NOTE — Progress Notes (Deleted)
Follow-up Outpatient Visit Date: 01/18/2020  Primary Care Provider: [place]ille [place] [id number]  Chief Complaint: ***  HPI:  Diane [name] is a 69 y.o. female with history of nonobstructive coronary artery disease, stroke, hypertension, hyperlipidemia, and GERD, who presents for follow-up of chest pain and shortness of breath.  She was last seen in our office by Ignacia Bayley, NP, in 11/2018.  At that time, she reported feeling better in the setting of having lost 14 pounds.  Exertional dyspnea and exercise capacity had improved.  No medication changes were made.  --------------------------------------------------------------------------------------------------  Cardiovascular History & Procedures: Cardiovascular Problems:  Nonobstructive coronary artery disease by catheterization in 2013  Atypical chest pain  Heart failure  Risk Factors:  Known coronary artery disease, hypertension, hyperlipidemia, obesity, and sedentary lifestyle  Cath/PCI:  LHC (10/28/13): LMCA normal. LAD with 30% proximal and 40% mid lesions. Large LCx without significant disease. Dominant RCA with 30% proximal stenosis. LVEF 55-60%.  CV Surgery:  None  EP Procedures and Devices:  None  Non-Invasive Evaluation(s):  Pharmacologic MPI (05/19/17): Low risk study with small in size, moderate in severity, reversible defect at the apex. This most likely represents shifting breast attenuation, though small area of ischemia cannot be excluded. LVEF >65%.  TTE (05/05/17): Normal LV size and wall thickness with LVEF 65-70%. Normal wall motion. Grade 1 diastolic dysfunction. Normal RV size and function. No significant valvular abnormalities.  Carotid Doppler (02/03/13): No significant extracranial carotid artery stenosis. Patent vertebral arteries with antegrade flow.  TTE (02/02/13): Normal LV size and wall thickness with LVEF of 55-60%. Normal LV wall motion. Grade 1 diastolic  dysfunction. Normal RV size and function. No significant valvular abnormalities. Mild pulmonary hypertension.  Recent CV Pertinent Labs: Lab Results  Component Value Date   CHOL 164 09/08/2018   CHOL 212 (H) 11/11/2017   HDL 65.30 09/08/2018   HDL 70 11/11/2017   LDLCALC 68 09/08/2018   LDLCALC 112 (H) 11/11/2017   TRIG 154.0 (H) 09/08/2018   CHOLHDL 3 09/08/2018   INR 0.96 06/25/2015   BNP 14.4 04/02/2017   K 3.7 12/25/2019   MG 2.0 10/28/2013   BUN 11 12/25/2019   BUN 11 06/17/2017   CREATININE 0.52 12/25/2019    Past medical and surgical history were reviewed and updated in EPIC.  No outpatient medications have been marked as taking for the 01/18/20 encounter (Appointment) with Yamili Lichtenwalner, Harrell Gave, MD.    Allergies: Boniva [ibandronic acid], Fosamax [alendronate sodium], and Prolia [denosumab]  Social History   Tobacco Use  . Smoking status: Never Smoker  . Smokeless tobacco: Never Used  . Tobacco comment: LIVES WITH 2 SMOKERS   Substance Use Topics  . Alcohol use: Yes    Comment: 1 glass of wine per month  . Drug use: No    Family History  Problem Relation Age of Onset  . Healthy Mother   . Hypertension Mother   . Stroke Mother   . Stroke Father   . Heart failure Father   . Heart disease Father        CABG  . Ovarian cancer Paternal Grandmother   . Liver cancer Paternal Grandfather   . Stroke Maternal Grandmother   . Heart failure Maternal Grandmother   . Heart failure Maternal Grandfather   . Juvenile Diabetes Son   . Healthy Son   . Healthy Son   . Hypertension Son   . Healthy Son   . Hypertension Son   .  Healthy Son   . Healthy Daughter   . Healthy Daughter   . Healthy Daughter   . Healthy Daughter   . Colon cancer Neg Hx   . Stomach cancer Neg Hx   . Rectal cancer Neg Hx   . Esophageal cancer Neg Hx   . Breast cancer Neg Hx     Review of Systems: A 12-system review of systems was performed and was negative except as noted in the  HPI.  --------------------------------------------------------------------------------------------------  Physical Exam: There were no vitals taken for this visit.  General:  *** HEENT: No conjunctival pallor or scleral icterus. Facemask in place. Neck: Supple without lymphadenopathy, thyromegaly, JVD, or HJR. Lungs: Normal work of breathing. Clear to auscultation bilaterally without wheezes or crackles. Heart: Regular rate and rhythm without murmurs, rubs, or gallops. Non-displaced PMI. Abd: Bowel sounds present. Soft, NT/ND without hepatosplenomegaly Ext: No lower extremity edema. Radial, PT, and DP pulses are 2+ bilaterally. Skin: Warm and dry without rash.  EKG:  ***  Lab Results  Component Value Date   WBC 4.6 12/25/2019   HGB 13.6 12/25/2019   HCT 40.5 12/25/2019   MCV 98.5 12/25/2019   PLT 165 12/25/2019    Lab Results  Component Value Date   NA 136 12/25/2019   K 3.7 12/25/2019   CL 101 12/25/2019   CO2 25 12/25/2019   BUN 11 12/25/2019   CREATININE 0.52 12/25/2019   GLUCOSE 95 12/25/2019   ALT 17 12/25/2019    Lab Results  Component Value Date   CHOL 164 09/08/2018   HDL 65.30 09/08/2018   LDLCALC 68 09/08/2018   TRIG 154.0 (H) 09/08/2018   CHOLHDL 3 09/08/2018    --------------------------------------------------------------------------------------------------  ASSESSMENT AND PLAN: Diane Bush, MD 01/16/2020 2:48 PM

## 2020-01-18 ENCOUNTER — Ambulatory Visit: Payer: Medicare HMO | Admitting: Internal Medicine

## 2020-02-10 ENCOUNTER — Other Ambulatory Visit: Payer: Self-pay

## 2020-02-10 ENCOUNTER — Encounter: Payer: Self-pay | Admitting: Internal Medicine

## 2020-02-10 ENCOUNTER — Ambulatory Visit: Payer: Medicare Other | Admitting: Internal Medicine

## 2020-02-10 VITALS — BP 130/80 | HR 62 | Ht 62.0 in | Wt 185.0 lb

## 2020-02-10 DIAGNOSIS — I25119 Atherosclerotic heart disease of native coronary artery with unspecified angina pectoris: Secondary | ICD-10-CM | POA: Diagnosis not present

## 2020-02-10 DIAGNOSIS — Z01810 Encounter for preprocedural cardiovascular examination: Secondary | ICD-10-CM

## 2020-02-10 DIAGNOSIS — E785 Hyperlipidemia, unspecified: Secondary | ICD-10-CM | POA: Diagnosis not present

## 2020-02-10 DIAGNOSIS — I10 Essential (primary) hypertension: Secondary | ICD-10-CM

## 2020-02-10 MED ORDER — NITROGLYCERIN 0.4 MG SL SUBL: 0.4000 mg | SUBLINGUAL_TABLET | SUBLINGUAL | 1 refills | Status: DC | PRN

## 2020-02-10 NOTE — Patient Instructions (Addendum)
Medication Instructions:  Your physician recommends that you continue on your current medications as directed. Please refer to the Current Medication list given to you today.  Nitroglycerin as needed for chest pain - Dissolve 1 tablet (0.4 mg) under your tongue every 5 minutes as needed for chest pain. Do not take more than 3 doses. If chest pain does not resolve, then call 911 or go to the Emergency Room.   *If you need a refill on your cardiac medications before your next appointment, please call your pharmacy*  Lab Work: Your physician recommends that you return for lab work in: BMET.  - Once the CT is scheduled. Need to get within 1 week of the test.  - Please go to the [facility]. You will check in at the front desk to the right as you walk into the atrium. Valet Parking is offered if needed. - No appointment needed. You may go any day between 7 am and 6 pm.  If you have labs (blood work) drawn today and your tests are completely normal, you will receive your results only by: [name] MyChart Message (if you have MyChart) OR . A paper copy in the mail If you have any lab test that is abnormal or we need to change your treatment, we will call you to review the results.  Testing/Procedures: Your cardiac CT will be scheduled at one of the below locations:   [facility] [address] [place], [place] [address] [phone]  [place] [address] [place], [place] [address] [phone]  If scheduled at [facility], please arrive at the [facility] main entrance of [facility] 30 minutes prior to test start time. Proceed to the [facility] Radiology Department (first floor) to check-in and test prep.  If scheduled at [facility], please arrive 15 mins early for check-in and test prep.  Please follow these instructions carefully (unless otherwise directed):  On the Night  Before the Test: . Be sure to Drink plenty of water. . Do not consume any caffeinated/decaffeinated beverages or chocolate 12 hours prior to your test. . Do not take any antihistamines 12 hours prior to your test. . If you take Metformin do not take 24 hours prior to test.  On the Day of the Test: . Drink plenty of water. Do not drink any water within one hour of the test. . Do not eat any food 4 hours prior to the test. . You may take your regular medications prior to the test.  . HOLD Furosemide/Hydrochlorothiazide morning of the test. . FEMALES- please wear underwire-free bra if available      After the Test: . Drink plenty of water. . After receiving IV contrast, you may experience a mild flushed feeling. This is normal. . On occasion, you may experience a mild rash up to 24 hours after the test. This is not dangerous. If this occurs, you can take Benadryl 25 mg and increase your fluid intake. . If you experience trouble breathing, this can be serious. If it is severe call 911 IMMEDIATELY. If it is mild, please call our office. . If you take any of these medications: Glipizide/Metformin, Avandament, Glucavance, please do not take 48 hours after completing test unless otherwise instructed.   Once we have confirmed authorization from your insurance company, we will call you to set up a date and time for your test.   For non-scheduling related  questions, please contact the cardiac imaging nurse navigator should you have any questions/concerns: Marchia Bond, RN Navigator Cardiac Imaging [facility] [phone] mobile  Follow-Up: At [facility], you and your health needs are our priority.  As part of our continuing mission to provide you with exceptional heart care, we have created designated Provider Care Teams.  These Care Teams include your primary Cardiologist (physician) and Advanced Practice Providers (APPs -  Physician Assistants and Nurse  Practitioners) who all work together to provide you with the care you need, when you need it.  Your next appointment:   6 week(s)  The format for your next appointment:   In Person  Provider:    You may see Nelva Bush, MD or one of the following Advanced Practice Providers on your designated Care Team:    Murray Hodgkins, NP  Christell Faith, PA-C  Marrianne Mood, PA-C    Cardiac CT Angiogram A cardiac CT angiogram is a procedure to look at the heart and the area around the heart. It may be done to help find the cause of chest pains or other symptoms of heart disease. During this procedure, a substance called contrast dye is injected into the blood vessels in the area to be checked. A large X-ray machine, called a CT scanner, then takes detailed pictures of the heart and the surrounding area. The procedure is also sometimes called a coronary CT angiogram, coronary artery scanning, or CTA. A cardiac CT angiogram allows the health care provider to see how well blood is flowing to and from the heart. The health care provider will be able to see if there are any problems, such as:  Blockage or narrowing of the coronary arteries in the heart.  Fluid around the heart.  Signs of weakness or disease in the muscles, valves, and tissues of the heart. Tell a health care provider about:  Any allergies you have. This is especially important if you have had a previous allergic reaction to contrast dye.  All medicines you are taking, including vitamins, herbs, eye drops, creams, and over-the-counter medicines.  Any blood disorders you have.  Any surgeries you have had.  Any medical conditions you have.  Whether you are pregnant or may be pregnant.  Any anxiety disorders, chronic pain, or other conditions you have that may increase your stress or prevent you from lying still. What are the risks? Generally, this is a safe procedure. However, problems may occur,  including:  Bleeding.  Infection.  Allergic reactions to medicines or dyes.  Damage to other structures or organs.  Kidney damage from the contrast dye that is used.  Increased risk of cancer from radiation exposure. This risk is low. Talk with your health care provider about: ? The risks and benefits of testing. ? How you can receive the lowest dose of radiation. What happens before the procedure?  Wear comfortable clothing and remove any jewelry, glasses, dentures, and hearing aids.  Follow instructions from your health care provider about eating and drinking. This may include: ? For 12 hours before the procedure -- avoid caffeine. This includes tea, coffee, soda, energy drinks, and diet pills. Drink plenty of water or other fluids that do not have caffeine in them. Being well hydrated can prevent complications. ? For 4-6 hours before the procedure -- stop eating and drinking. The contrast dye can cause nausea, but this is less likely if your stomach is empty.  Ask your health care provider about changing or  stopping your regular medicines. This is especially important if you are taking diabetes medicines, blood thinners, or medicines to treat problems with erections (erectile dysfunction). What happens during the procedure?   Hair on your chest may need to be removed so that small sticky patches called electrodes can be placed on your chest. These will transmit information that helps to monitor your heart during the procedure.  An IV will be inserted into one of your veins.  You might be given a medicine to control your heart rate during the procedure. This will help to ensure that good images are obtained.  You will be asked to lie on an exam table. This table will slide in and out of the CT machine during the procedure.  Contrast dye will be injected into the IV. You might feel warm, or you may get a metallic taste in your mouth.  You will be given a medicine called  nitroglycerin. This will relax or dilate the arteries in your heart.  The table that you are lying on will move into the CT machine tunnel for the scan.  The person running the machine will give you instructions while the scans are being done. You may be asked to: ? Keep your arms above your head. ? Hold your breath. ? Stay very still, even if the table is moving.  When the scanning is complete, you will be moved out of the machine.  The IV will be removed. The procedure may vary among health care providers and hospitals. What can I expect after the procedure? After your procedure, it is common to have:  A metallic taste in your mouth from the contrast dye.  A feeling of warmth.  A headache from the nitroglycerin. Follow these instructions at home:  Take over-the-counter and prescription medicines only as told by your health care provider.  If you are told, drink enough fluid to keep your urine pale yellow. This will help to flush the contrast dye out of your body.  Most people can return to their normal activities right after the procedure. Ask your health care provider what activities are safe for you.  It is up to you to get the results of your procedure. Ask your health care provider, or the department that is doing the procedure, when your results will be ready.  Keep all follow-up visits as told by your health care provider. This is important. Contact a health care provider if:  You have any symptoms of allergy to the contrast dye. These include: ? Shortness of breath. ? Rash or hives. ? A racing heartbeat. Summary  A cardiac CT angiogram is a procedure to look at the heart and the area around the heart. It may be done to help find the cause of chest pains or other symptoms of heart disease.  During this procedure, a large X-ray machine, called a CT scanner, takes detailed pictures of the heart and the surrounding area after a contrast dye has been injected into blood  vessels in the area.  Ask your health care provider about changing or stopping your regular medicines before the procedure. This is especially important if you are taking diabetes medicines, blood thinners, or medicines to treat erectile dysfunction.  If you are told, drink enough fluid to keep your urine pale yellow. This will help to flush the contrast dye out of your body. This information is not intended to replace advice given to you by your health care provider. Make sure you discuss any  questions you have with your health care provider. Document Revised: 08/10/2019 Document Reviewed: 08/10/2019 Elsevier Patient Education  [place].

## 2020-02-10 NOTE — Progress Notes (Signed)
Follow-up Outpatient Visit Date: 02/10/2020  Primary Care Provider: Lucille Passy, MD No address on file  Chief Complaint: Chest pain and panic attacks  HPI:  Diane [name] is a 69 y.o. female with history of nonobstructive coronary artery disease, stroke, hypertension, hyperlipidemia, and GERD, who presents for follow-up of chest pain and shortness of breath.  She was last seen in our office by Ignacia Bayley, NP, in 11/2018.  At that time, she reported feeling better in the setting of having lost 14 pounds.  Exertional dyspnea and exercise capacity had improved.  No medication changes were made.  She was seen in the ED in late 11/2019 following overdose of Ascomp with codeine.  Today, Diane [name] reports that she has been struggling with panic attacks following her son's diagnosis of metastatic colon cancer.  She feels like her chest pain often comes on when she gets nervous, though it has also occurred at times when she feels fairly relaxed.  It is not clearly exertional, though she notes chronic exertional dyspnea that has been stable for at least a year.  She currently experience aforementioned chest discomfort about 3 times a week.  She denies palpitations, lightheadedness, and edema.  Her blood pressure has been well controlled.  In fact, her antihypertensive regimen was decreased at her last encounter with Dr. Deborra Medina due to hypotension.  --------------------------------------------------------------------------------------------------  Cardiovascular History & Procedures: Cardiovascular Problems:  Nonobstructive coronary artery disease by catheterization in 2013  Atypical chest pain  Heart failure  Risk Factors:  Known coronary artery disease, hypertension, hyperlipidemia, obesity, and sedentary lifestyle  Cath/PCI:  LHC (10/28/13): LMCA normal. LAD with 30% proximal and 40% mid lesions. Large LCx without significant disease. Dominant RCA with 30% proximal stenosis. LVEF  55-60%.  CV Surgery:  None  EP Procedures and Devices:  None  Non-Invasive Evaluation(s):  Pharmacologic MPI (05/19/17): Low risk study with small in size, moderate in severity, reversible defect at the apex. This most likely represents shifting breast attenuation, though small area of ischemia cannot be excluded. LVEF >65%.  TTE (05/05/17): Normal LV size and wall thickness with LVEF 65-70%. Normal wall motion. Grade 1 diastolic dysfunction. Normal RV size and function. No significant valvular abnormalities.  Carotid Doppler (02/03/13): No significant extracranial carotid artery stenosis. Patent vertebral arteries with antegrade flow.  TTE (02/02/13): Normal LV size and wall thickness with LVEF of 55-60%. Normal LV wall motion. Grade 1 diastolic dysfunction. Normal RV size and function. No significant valvular abnormalities. Mild pulmonary hypertension.  Recent CV Pertinent Labs: Lab Results  Component Value Date   CHOL 164 09/08/2018   CHOL 212 (H) 11/11/2017   HDL 65.30 09/08/2018   HDL 70 11/11/2017   LDLCALC 68 09/08/2018   LDLCALC 112 (H) 11/11/2017   TRIG 154.0 (H) 09/08/2018   CHOLHDL 3 09/08/2018   INR 0.96 06/25/2015   BNP 14.4 04/02/2017   K 3.7 12/25/2019   MG 2.0 10/28/2013   BUN 11 12/25/2019   BUN 11 06/17/2017   CREATININE 0.52 12/25/2019    Past medical and surgical history were reviewed and updated in EPIC.  Current Meds  Medication Sig  . aspirin EC 81 MG tablet Take 1 tablet (81 mg total) by mouth daily.  [name] atorvastatin (LIPITOR) 80 MG tablet Take 1 tablet (80 mg total) by mouth daily.  . benazepril-hydrochlorthiazide (LOTENSIN HCT) 20-12.5 MG tablet Take 1 tablet by mouth daily.  . budesonide-formoterol (SYMBICORT) 80-4.5 MCG/ACT inhaler Inhale 2 puffs into the lungs 2 (two) times daily.  [name]  butalbital-acetaminophen-caffeine (FIORICET WITH CODEINE) 50-325-40-30 MG capsule TAKE 1 TO 2 CAPSULES BY MOUTH DAILY AS NEEDED FOR HEADACHE  . cholecalciferol  (VITAMIN D) 1000 UNITS tablet Take 1,000 Units by mouth daily.  . diazepam (VALIUM) 5 MG tablet Take 1 tablet (5 mg total) by mouth every 6 (six) hours as needed. for anxiety  . diclofenac sodium (VOLTAREN) 1 % GEL Apply 2 g 4 (four) times daily topically.  [name] diltiazem (CARDIZEM CD) 180 MG 24 hr capsule Take 1 capsule (180 mg total) by mouth daily.  . fluticasone (FLONASE) 50 MCG/ACT nasal spray SPRAY 2 SPRAYS INTO EACH NOSTRIL EVERY DAY  . furosemide (LASIX) 20 MG tablet Take 1 tablet (20 mg total) by mouth daily.  [name] PROAIR HFA 108 (90 Base) MCG/ACT inhaler INHALE 1 TO 2 PUFFS INTO THE LUNGS EVERY 6 HOURS AS NEEDED FOR WHEEZING OR SHORTNESS OF BREATH  . ranolazine (RANEXA) 500 MG 12 hr tablet TAKE 1 TABLET(500 MG) BY MOUTH TWICE DAILY  . sertraline (ZOLOFT) 100 MG tablet TAKE 1.5 TABLETS BY MOUTH DAILY  . traMADol (ULTRAM) 50 MG tablet Take 1 tablet (50 mg total) by mouth every 8 (eight) hours as needed.  . traZODone (DESYREL) 150 MG tablet Take 1 tablet (150 mg total) by mouth at bedtime as needed. for sleep  . valACYclovir (VALTREX) 1000 MG tablet TAKE 1 TABLET BY MOUTH TWICE DAILY (Patient taking differently: as needed. )    Allergies: Boniva [ibandronic acid], Fosamax [alendronate sodium], and Prolia [denosumab]  Social History   Tobacco Use  . Smoking status: Never Smoker  . Smokeless tobacco: Never Used  . Tobacco comment: LIVES WITH 2 SMOKERS   Substance Use Topics  . Alcohol use: Yes    Comment: 1 glass of wine per month  . Drug use: No    Family History  Problem Relation Age of Onset  . Healthy Mother   . Hypertension Mother   . Stroke Mother   . Stroke Father   . Heart failure Father   . Heart disease Father        CABG  . Ovarian cancer Paternal Grandmother   . Liver cancer Paternal Grandfather   . Stroke Maternal Grandmother   . Heart failure Maternal Grandmother   . Heart failure Maternal Grandfather   . Juvenile Diabetes Son   . Healthy Son   . Healthy Son    . Hypertension Son   . Healthy Son   . Hypertension Son   . Healthy Son   . Healthy Daughter   . Healthy Daughter   . Healthy Daughter   . Healthy Daughter   . Colon cancer Neg Hx   . Stomach cancer Neg Hx   . Rectal cancer Neg Hx   . Esophageal cancer Neg Hx   . Breast cancer Neg Hx     Review of Systems: A 12-system review of systems was performed and was negative except as noted in the HPI.  --------------------------------------------------------------------------------------------------  Physical Exam: BP 130/80 (BP Location: Left Arm, Patient Position: Sitting, Cuff Size: Normal)   Pulse 62   Ht 5\' 2"  (1.575 m)   Wt 185 lb (83.9 kg)   SpO2 98%   BMI 33.84 kg/m   General: NAD. Neck: No JVD or HJR. Lungs: Normal work of breathing. Clear to auscultation bilaterally without wheezes or crackles. Heart: Regular rate and rhythm without murmurs, rubs, or gallops. Abd: Bowel sounds present. Soft, NT/ND without hepatosplenomegaly Ext: No lower extremity edema.  EKG: Normal sinus  rhythm without significant abnormality.  Lab Results  Component Value Date   WBC 4.6 12/25/2019   HGB 13.6 12/25/2019   HCT 40.5 12/25/2019   MCV 98.5 12/25/2019   PLT 165 12/25/2019    Lab Results  Component Value Date   NA 136 12/25/2019   K 3.7 12/25/2019   CL 101 12/25/2019   CO2 25 12/25/2019   BUN 11 12/25/2019   CREATININE 0.52 12/25/2019   GLUCOSE 95 12/25/2019   ALT 17 12/25/2019    Lab Results  Component Value Date   CHOL 164 09/08/2018   HDL 65.30 09/08/2018   LDLCALC 68 09/08/2018   TRIG 154.0 (H) 09/08/2018   CHOLHDL 3 09/08/2018    --------------------------------------------------------------------------------------------------  ASSESSMENT AND PLAN: Coronary artery disease with accelerating angina: Ms. Aniol reports recent worsening of chest pain, particularly when she is nervous.  It is not clearly exertional.  However, in the setting of prior  nonobstructive CAD, we have agreed to perform a cardiac CTA for further evaluation.  She should continue her current medications including aspirin, diltiazem, and ranolazine for secondary prevention and antianginal therapy.  I advised her to seek immediate medical attention should she have worsening chest pain.  Hyperlipidemia: Most recent LDL in our system was at goal.  We will request interval labs from [facility] family practice to reassess her lipids.  She should continue atorvastatin 80 mg daily.  Hypertension: Blood pressure borderline elevated today 130/80.  We will defer medication changes at this time.  Follow-up: Return to clinic in 6 weeks.  Nelva Bush, MD 02/10/2020 3:53 PM

## 2020-02-11 ENCOUNTER — Other Ambulatory Visit: Payer: Self-pay | Admitting: Internal Medicine

## 2020-02-27 ENCOUNTER — Telehealth (HOSPITAL_COMMUNITY): Payer: Self-pay | Admitting: Emergency Medicine

## 2020-02-27 NOTE — Telephone Encounter (Signed)
Pt calling to schedule her CCTA appt. Will route to scheduling Romie Minus ) to give patient a call back.   Informed the patient that we would be in contact closer to her appt to review the CCTA instructions. Appreicated the info.  Marchia Bond RN Navigator Cardiac Imaging [facility] [phone] Office  [phone] Cell

## 2020-03-08 ENCOUNTER — Other Ambulatory Visit: Payer: Self-pay

## 2020-03-08 MED ORDER — SERTRALINE HCL 100 MG PO TABS: ORAL_TABLET | ORAL | 0 refills | Status: DC

## 2020-03-14 ENCOUNTER — Telehealth: Payer: Self-pay | Admitting: *Deleted

## 2020-03-14 NOTE — Telephone Encounter (Signed)
Pt called [facility] in an attempt to cancel her upcoming CT as she has broken ribs. Advised it appears to have already been canceled, but will forward message to [place] for assistance rescheduling. Pt in agreement with plan and verbalizes appreciation of assistance.

## 2020-03-16 ENCOUNTER — Telehealth: Payer: Self-pay | Admitting: Internal Medicine

## 2020-03-16 ENCOUNTER — Other Ambulatory Visit: Payer: Self-pay

## 2020-03-16 MED ORDER — DILTIAZEM HCL ER COATED BEADS 180 MG PO CP24: ORAL_CAPSULE | ORAL | 0 refills | Status: DC

## 2020-03-16 MED ORDER — RANOLAZINE ER 500 MG PO TB12: ORAL_TABLET | ORAL | 0 refills | Status: DC

## 2020-03-16 NOTE — Telephone Encounter (Signed)
*  STAT* If patient is at the pharmacy, call can be transferred to refill team.   1. Which medications need to be refilled? (please list name of each medication and dose if known) ranolazine 500 mg bid, diltiazem 180 mg  2. Which pharmacy/location (including street and city if local pharmacy) is medication to be sent to? [facility] [address] in [place]  3. Do they need a 30 day or 90 day supply? 90 days

## 2020-03-16 NOTE — Telephone Encounter (Signed)
Requested Prescriptions   Signed Prescriptions Disp Refills   diltiazem (CARDIZEM CD) 180 MG 24 hr capsule 90 capsule 0    Sig: TAKE 1 CAPSULE BY MOUTH EVERY DAY    Authorizing Provider: END, CHRISTOPHER    Ordering User: Janan Ridge   ranolazine (RANEXA) 500 MG 12 hr tablet 180 tablet 0    Sig: TAKE 1 TABLET(500 MG) BY MOUTH TWICE DAILY    Authorizing Provider: END, CHRISTOPHER    Ordering User: Janan Ridge

## 2020-03-16 NOTE — Telephone Encounter (Signed)
Requested Prescriptions   Signed Prescriptions Disp Refills  . diltiazem (CARDIZEM CD) 180 MG 24 hr capsule 90 capsule 0    Sig: TAKE 1 CAPSULE BY MOUTH EVERY DAY    Authorizing Provider: END, CHRISTOPHER    Ordering User: Janan Ridge ranolazine (RANEXA) 500 MG 12 hr tablet 180 tablet 0    Sig: TAKE 1 TABLET(500 MG) BY MOUTH TWICE DAILY    Authorizing Provider: END, CHRISTOPHER    Ordering User: Janan Ridge

## 2020-03-19 ENCOUNTER — Ambulatory Visit (HOSPITAL_COMMUNITY): Payer: Medicare Other

## 2020-03-23 ENCOUNTER — Ambulatory Visit: Payer: Medicare Other | Admitting: Internal Medicine

## 2020-04-06 ENCOUNTER — Telehealth (HOSPITAL_COMMUNITY): Payer: Self-pay | Admitting: Emergency Medicine

## 2020-04-06 NOTE — Telephone Encounter (Signed)
Pt calling to clarify when her CCTA appt was. I told her it was 04/20/20 at 9:15a. She said [facility] to the test.  Pt wondering when her f/u appt would be to go over results. Can you reach out to her?  Thank you! Marchia Bond RN Navigator Cardiac Imaging [facility] [phone] Office  [phone] Cell

## 2020-04-09 NOTE — Telephone Encounter (Signed)
Patient notified and verbalized understanding and was appreciative.

## 2020-04-09 NOTE — Telephone Encounter (Signed)
We will contact her with the results of the CTA within 1-2 days of study completion and arrange for additional follow-up as needed at that time.  Nelva Bush, MD [facility]

## 2020-04-12 ENCOUNTER — Other Ambulatory Visit: Payer: Self-pay | Admitting: Internal Medicine

## 2020-04-12 ENCOUNTER — Other Ambulatory Visit
Admission: RE | Admit: 2020-04-12 | Discharge: 2020-04-12 | Disposition: A | Discharge status: Home / Self Care | Payer: Medicare Other | Source: Ambulatory Visit | Attending: Internal Medicine | Admitting: Internal Medicine

## 2020-04-12 ENCOUNTER — Other Ambulatory Visit: Payer: Self-pay

## 2020-04-12 DIAGNOSIS — Z01810 Encounter for preprocedural cardiovascular examination: Secondary | ICD-10-CM | POA: Diagnosis present

## 2020-04-12 DIAGNOSIS — I10 Essential (primary) hypertension: Secondary | ICD-10-CM | POA: Diagnosis present

## 2020-04-12 DIAGNOSIS — I25119 Atherosclerotic heart disease of native coronary artery with unspecified angina pectoris: Secondary | ICD-10-CM | POA: Insufficient documentation

## 2020-04-12 LAB — BASIC METABOLIC PANEL
Anion gap: 8 (ref 5–15)
BUN: 13 mg/dL (ref 8–23)
CO2: 29 mmol/L (ref 22–32)
Calcium: 9.4 mg/dL (ref 8.9–10.3)
Chloride: 101 mmol/L (ref 98–111)
Creatinine, Ser: 0.75 mg/dL (ref 0.44–1.00)
GFR calc Af Amer: >60 mL/min (ref >60)
GFR calc non Af Amer: >60 mL/min (ref >60)
Glucose, Bld: 97 mg/dL (ref 70–99)
Potassium: 4.5 mmol/L (ref 3.5–5.1)
Sodium: 138 mmol/L (ref 135–145)

## 2020-04-13 ENCOUNTER — Other Ambulatory Visit: Payer: Self-pay

## 2020-04-13 MED ORDER — ATORVASTATIN CALCIUM 80 MG PO TABS: 80.0000 mg | ORAL_TABLET | Freq: Every day | ORAL | 0 refills | Status: DC

## 2020-04-19 ENCOUNTER — Telehealth (HOSPITAL_COMMUNITY): Payer: Self-pay | Admitting: Emergency Medicine

## 2020-04-19 ENCOUNTER — Encounter (HOSPITAL_COMMUNITY): Payer: Self-pay

## 2020-04-19 NOTE — Telephone Encounter (Signed)
Attempted to call patient regarding upcoming cardiac CT appointment. °Left message on voicemail with name and callback number °Robena Ewy RN Navigator Cardiac Imaging °[facility] °[phone] Office °[phone] Cell ° °

## 2020-04-20 ENCOUNTER — Ambulatory Visit (HOSPITAL_COMMUNITY): Payer: Medicare Other

## 2020-05-01 ENCOUNTER — Other Ambulatory Visit: Payer: Self-pay

## 2020-05-01 MED ORDER — ATORVASTATIN CALCIUM 80 MG PO TABS: 80.0000 mg | ORAL_TABLET | Freq: Every day | ORAL | 0 refills | Status: DC

## 2020-05-08 ENCOUNTER — Other Ambulatory Visit: Payer: Self-pay

## 2020-05-08 MED ORDER — ATORVASTATIN CALCIUM 80 MG PO TABS: 80.0000 mg | ORAL_TABLET | Freq: Every day | ORAL | 0 refills | Status: DC

## 2020-05-09 ENCOUNTER — Telehealth (HOSPITAL_COMMUNITY): Payer: Self-pay | Admitting: *Deleted

## 2020-05-09 NOTE — Telephone Encounter (Signed)
Attempted to call patient regarding upcoming cardiac CT appointment. Left message on voicemail with name and callback number  Thierry Dobosz Tai RN Navigator Cardiac Imaging  Heart and Vascular Services [phone] Office [phone] Cell 

## 2020-05-09 NOTE — Telephone Encounter (Signed)
Pt returned call regarding Cardiac CT scan.  Instructions reviewed and pt expressed understanding.  Pt encourage to take her anti-anxiety medication and states that her daughter will drive her to her CT appointment.

## 2020-05-10 ENCOUNTER — Encounter (HOSPITAL_COMMUNITY): Payer: Self-pay

## 2020-05-10 ENCOUNTER — Other Ambulatory Visit: Payer: Self-pay

## 2020-05-10 ENCOUNTER — Ambulatory Visit (HOSPITAL_COMMUNITY)
Admission: RE | Admit: 2020-05-10 | Discharge: 2020-05-10 | Disposition: A | Discharge status: Home / Self Care | Payer: Medicare HMO | Source: Ambulatory Visit | Attending: Internal Medicine | Admitting: Internal Medicine

## 2020-05-10 DIAGNOSIS — I25119 Atherosclerotic heart disease of native coronary artery with unspecified angina pectoris: Secondary | ICD-10-CM | POA: Insufficient documentation

## 2020-05-10 DIAGNOSIS — I10 Essential (primary) hypertension: Secondary | ICD-10-CM | POA: Diagnosis present

## 2020-05-10 DIAGNOSIS — E785 Hyperlipidemia, unspecified: Secondary | ICD-10-CM | POA: Insufficient documentation

## 2020-05-10 MED ORDER — IOHEXOL 350 MG/ML SOLN
80.0000 mL | Freq: Once | INTRAVENOUS | Status: AC | PRN
  Administered 2020-05-10: 80 mL via INTRAVENOUS

## 2020-05-10 MED ORDER — NITROGLYCERIN 0.4 MG SL SUBL
SUBLINGUAL_TABLET | SUBLINGUAL | Status: AC
  Filled 2020-05-10: qty 2

## 2020-05-10 MED ORDER — NITROGLYCERIN 0.4 MG SL SUBL
0.8000 mg | SUBLINGUAL_TABLET | Freq: Once | SUBLINGUAL | Status: AC
  Administered 2020-05-10: 0.8 mg via SUBLINGUAL

## 2020-05-18 ENCOUNTER — Other Ambulatory Visit: Payer: Self-pay

## 2020-05-18 ENCOUNTER — Encounter: Payer: Self-pay | Admitting: Internal Medicine

## 2020-05-18 ENCOUNTER — Ambulatory Visit (INDEPENDENT_AMBULATORY_CARE_PROVIDER_SITE_OTHER): Payer: Medicare HMO | Admitting: Physician Assistant

## 2020-05-18 VITALS — BP 108/74 | HR 66 | Ht 61.5 in | Wt 183.1 lb

## 2020-05-18 DIAGNOSIS — Z8679 Personal history of other diseases of the circulatory system: Secondary | ICD-10-CM

## 2020-05-18 DIAGNOSIS — I10 Essential (primary) hypertension: Secondary | ICD-10-CM

## 2020-05-18 DIAGNOSIS — R1011 Right upper quadrant pain: Secondary | ICD-10-CM | POA: Diagnosis not present

## 2020-05-18 DIAGNOSIS — E785 Hyperlipidemia, unspecified: Secondary | ICD-10-CM

## 2020-05-18 DIAGNOSIS — I25110 Atherosclerotic heart disease of native coronary artery with unstable angina pectoris: Secondary | ICD-10-CM | POA: Diagnosis not present

## 2020-05-18 NOTE — Patient Instructions (Signed)
Medication Instructions:  Your physician recommends that you continue on your current medications as directed. Please refer to the Current Medication list given to you today.  *If you need a refill on your cardiac medications before your next appointment, please call your pharmacy*   Lab Work: 1- Your physician recommends that you return for lab work in: TODAY - CBC, BMET, LIPID, DIRECT LDL.  2- COVID PRE- TEST: You will need a COVID TEST prior to the procedure:  LOCATION: [facility].  DATE/TIME:  Wednesday, May 30, 2020 anytime between 8 am and 1 pm.  If you have labs (blood work) drawn today and your tests are completely normal, you will receive your results only by: [name] MyChart Message (if you have MyChart) OR . A paper copy in the mail If you have any lab test that is abnormal or we need to change your treatment, we will call you to review the results.   Testing/Procedures:   You are scheduled for a left Cardiac Catheterization on Friday, June 4 with Dr. Harrell Gave End.  1. Please arrive at the [facility] (Main Entrance A) at [facility]: [place], [place] [address] at 5:30 AM (This time is two hours before your procedure to ensure your preparation). Free valet parking service is available.   Special note: Every effort is made to have your procedure done on time. Please understand that emergencies sometimes delay scheduled procedures.  2. Diet: Do not eat solid foods after midnight.  The patient may have clear liquids until 5am upon the day of the procedure.  3. Labs: You will need to have blood drawn on TODAY. You do not need to be fasting.  4. Medication instructions in preparation for your procedure:   Contrast Allergy: No  On the morning of your procedure, take your Aspirin and any morning medicines NOT listed above.  You may use sips of water.  5. Plan for one night stay--bring personal belongings. 6. Bring a  current list of your medications and current insurance cards. 7. You MUST have a responsible person to drive you home. 8. Someone MUST be with you the first 24 hours after you arrive home or your discharge will be delayed. 9. Please wear clothes that are easy to get on and off and wear slip-on shoes.  Thank you for allowing [place] to care for you!   -- [facility]    Follow-Up: At [facility], you and your health needs are our priority.  As part of our continuing mission to provide you with exceptional heart care, we have created designated Provider Care Teams.  These Care Teams include your primary Cardiologist (physician) and Advanced Practice Providers (APPs -  Physician Assistants and Nurse Practitioners) who all work together to provide you with the care you need, when you need it.  We recommend signing up for the patient portal called "MyChart".  Sign up information is provided on this After Visit Summary.  MyChart is used to connect with patients for Virtual Visits (Telemedicine).  Patients are able to view lab/test results, encounter notes, upcoming appointments, etc.  Non-urgent messages can be sent to your provider as well.   To learn more about what you can do with MyChart, go to [email or web address].    Your next appointment:   1 week after cath with Dr ENd or Thurmond Butts.  The format for your next appointment:   In Person  Provider:  You may see Nelva Bush, MD or one of the following Advanced Practice Providers on your designated Care Team:   Christell Faith, [place]

## 2020-05-18 NOTE — Progress Notes (Signed)
Cardiology Office Note    Date:  05/18/2020   ID:  Diane [name], DOB 08-12-1951, MRN [id number]  PCP:  Shireen Quan, MD  Cardiologist:  Nelva Bush, MD  Electrophysiologist:  None   Chief Complaint: Follow-up  History of Present Illness:   Diane [name] is a 69 y.o. 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  She previously underwent diagnostic LHC in 09/2013 which showed nonobstructive disease including a normal left main, proximal LAD 30% stenosis, mid LAD 40% stenosis, LCx normal, proximal RCA 30% stenosis, LVEF 55 to 60%.  Myoview in 04/2017 showed an apical defect most consistent with shifting breast attenuation, though a small area of ischemia was unable to be excluded.  EF greater than 65%.  In follow-up in 11/2018 she reported feeling better after having lost 14 pounds with improved dyspnea and exercise capacity.  She was last seen in the office in 01/2020 noting increased anxiety/panic attacks following the diagnosis of her son's metastatic colon cancer.  She noted chest discomfort that presented when she became nervous or anxious as well as some degree of chronic exertional dyspnea.  Symptoms were occurring approximately 3 times per week.  In this setting, she underwent coronary CTA on 05/10/2020 which showed significant two-vessel CAD involving a large LAD and medium codominant RCA as outlined below.  She comes in today noting continued exertional chest discomfort and shortness of breath that improves with rest and is mostly unchanged when compared to her last visit.  She also noted some mild dizziness this morning while getting out of the shower that lasted for a second or 2.  She is currently chest pain-free.  She is tolerating all medications without issues.  Of note, she is also in the process of GI evaluation for  right upper quadrant pain with follow-up with GI next week.   Labs independently reviewed: 03/2020 - potassium 4.5, BUN 13, serum creatinine 0.75 11/2019 - Hgb 13.6, PLT 165, albumin 4.0, AST/ALT normal 10/2019 - TC 177, TG 132, HDL 62, LDL 92, TSH normal  Past Medical History:  Diagnosis Date  . Arthritis   . 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 Depression   . Diastolic dysfunction    a. 01/2013 Echo: EF 55-60%, no rwma, Gr1 DD, PASP 33mmHg; b. 04/2017 Echo: EF 65-70%, no rwma, Gr1 DD.  [name] GERD (gastroesophageal reflux disease)   . Hyperlipidemia   . Hypertension   . Non-obstructive CAD (coronary artery disease)    a. 09/2013 Cath: LM nl, LAD 30p, 63m, LCX nl, RCA 30p, EF 55-60%; b. 04/2017 MV: EF>65%, apical defect - most consistent w/ shifting breast attenuation. Small area of isch cannot be excluded.  [name] PONV (postoperative nausea and vomiting)   . Skin cancer of face 05/2015   basal cell  . Stroke [facility]) 2006   deaf in right ear    Past Surgical History:  Procedure Laterality Date  . BREAST BIOPSY Left 1984   abcess  . CESAREAN SECTION    . CHOLECYSTECTOMY  1990  . CHONDROPLASTY Left 01/05/2015   Procedure: CHONDROPLASTY;  Surgeon: Yvette Rack., MD;  Location: [facility];  Service: Orthopedics;  Laterality: Left;  . KNEE ARTHROSCOPY WITH MEDIAL MENISECTOMY  Left 01/05/2015   Procedure: LEFT KNEE ARTHROSCOPY WITH MEDIAL AND LATERAL MENISECTOMY; DEBRIDEMENT LATERAL PATELLA FEMORAL ;  Surgeon: Yvette Rack., MD;  Location: [facility] [place];  Service: Orthopedics;  Laterality: Left;  . LEFT HEART CATHETERIZATION WITH CORONARY ANGIOGRAM N/A 10/28/2013   Procedure: LEFT HEART CATHETERIZATION WITH CORONARY ANGIOGRAM;  Surgeon: Larey Dresser, MD;  Location: [facility] C[facility] LAB;  Service: Cardiovascular;  Laterality: N/A;  . TOTAL KNEE ARTHROPLASTY Left 07/06/2015   Procedure: TOTAL KNEE  ARTHROPLASTY;  Surgeon: Earlie Server, MD;  Location: [place];  Service: Orthopedics;  Laterality: Left;  . TUBAL LIGATION      Current Medications: Current Meds  Medication Sig  . aspirin EC 81 MG tablet Take 1 tablet (81 mg total) by mouth daily.  [name] atorvastatin (LIPITOR) 80 MG tablet Take 1 tablet (80 mg total) by mouth daily. PLEASE CONTACT OFFICE [phone] TO SCHEDULE APPOINTMENT FOR FUTURE REFILLS  . benazepril-hydrochlorthiazide (LOTENSIN HCT) 20-12.5 MG tablet Take 1 tablet by mouth daily.  . budesonide-formoterol (SYMBICORT) 80-4.5 MCG/ACT inhaler Inhale 2 puffs into the lungs 2 (two) times daily.  . butalbital-acetaminophen-caffeine (FIORICET WITH CODEINE) 50-325-40-30 MG capsule TAKE 1 TO 2 CAPSULES BY MOUTH DAILY AS NEEDED FOR HEADACHE  . cholecalciferol (VITAMIN D) 1000 UNITS tablet Take 1,000 Units by mouth daily.  . diazepam (VALIUM) 5 MG tablet Take 1 tablet (5 mg total) by mouth every 6 (six) hours as needed. for anxiety  . diclofenac sodium (VOLTAREN) 1 % GEL Apply 2 g 4 (four) times daily topically.  [name] diltiazem (CARDIZEM CD) 180 MG 24 hr capsule TAKE 1 CAPSULE BY MOUTH EVERY DAY  . fluticasone (FLONASE) 50 MCG/ACT nasal spray SPRAY 2 SPRAYS INTO EACH NOSTRIL EVERY DAY  . furosemide (LASIX) 20 MG tablet Take 1 tablet (20 mg total) by mouth daily.  . nitroGLYCERIN (NITROSTAT) 0.4 MG SL tablet Place 1 tablet (0.4 mg total) under the tongue every 5 (five) minutes as needed for chest pain. Maximum of 3 doses.  [name] PROAIR HFA 108 (90 Base) MCG/ACT inhaler INHALE 1 TO 2 PUFFS INTO THE LUNGS EVERY 6 HOURS AS NEEDED FOR WHEEZING OR SHORTNESS OF BREATH  . ranolazine (RANEXA) 500 MG 12 hr tablet TAKE 1 TABLET(500 MG) BY MOUTH TWICE DAILY  . sertraline (ZOLOFT) 100 MG tablet Take 1qd (Plz sched visit with new provider for future fills)  . traMADol (ULTRAM) 50 MG tablet Take 1 tablet (50 mg total) by mouth every 8 (eight) hours as needed.  . traZODone (DESYREL) 150 MG tablet Take 1  tablet (150 mg total) by mouth at bedtime as needed. for sleep  . valACYclovir (VALTREX) 1000 MG tablet TAKE 1 TABLET BY MOUTH TWICE DAILY (Patient taking differently: as needed. )    Allergies:   Boniva [ibandronic acid], Fosamax [alendronate sodium], and Prolia [denosumab]   Social History   Socioeconomic History  . Marital status: Significant Other    Spouse name: [name]  . Number of children: 8  . Years of education: Not on file  . Highest education level: Not on file  Occupational History  . 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 Smoking status: Never Smoker  . Smokeless tobacco: Never Used  . Tobacco comment: LIVES WITH 2 SMOKERS   Substance and Sexual Activity  . Alcohol use: Yes    Comment: 1 glass of wine per month  . Drug use: No  . Sexual  activity: Yes    Birth control/protection: Post-menopausal  Other Topics Concern  . Not on file  Social History Narrative   Divorced, but has fiancee.  Lives with fiancee in [place].  Moved here recently from [place], [place].     She 8 children- ages 25- 73.               Social Determinants of Health   Financial Resource Strain:   . Difficulty of Paying Living Expenses:   Food Insecurity:   . Worried About Charity fundraiser in the Last Year:   . Arboriculturist in the Last Year:   Transportation Needs:   . Film/video editor (Medical):   [name] Lack of Transportation (Non-Medical):   Physical Activity:   . Days of Exercise per Week:   . Minutes of Exercise per Session:   Stress:   . Feeling of Stress :   Social Connections:   . Frequency of Communication with Friends and Family:   . Frequency of Social Gatherings with Friends and Family:   . Attends Religious Services:   . Active Member of Clubs or Organizations:   . Attends Archivist Meetings:   [name] Marital Status:      Family History:  The patient's family history includes Healthy in her  daughter, daughter, daughter, daughter, mother, son, son, son, and son; Heart disease in her father; Heart failure in her father, maternal grandfather, and maternal grandmother; Hypertension in her mother, son, and son; Juvenile Diabetes in her son; Liver cancer in her paternal grandfather; Ovarian cancer in her paternal grandmother; Stroke in her father, maternal grandmother, and mother. There is no history of Colon cancer, Stomach cancer, Rectal cancer, Esophageal cancer, or Breast cancer.  ROS:   Review of Systems  Constitutional: Positive for malaise/fatigue. Negative for chills, diaphoresis, fever and weight loss.  HENT: Negative for congestion.   Eyes: Negative for discharge and redness.  Respiratory: Positive for shortness of breath. Negative for cough, sputum production and wheezing.   Cardiovascular: Positive for chest pain. Negative for palpitations, orthopnea, claudication, leg swelling and PND.  Gastrointestinal: Positive for abdominal pain. Negative for blood in stool, heartburn, nausea and vomiting.       Right upper quadrant abdominal pain  Musculoskeletal: Negative for falls and myalgias.  Skin: Negative for rash.  Neurological: Positive for weakness. Negative for dizziness, tingling, tremors, sensory change, speech change, focal weakness and loss of consciousness.  Endo/Heme/Allergies: Does not bruise/bleed easily.  Psychiatric/Behavioral: Negative for substance abuse. The patient is not nervous/anxious.   All other systems reviewed and are negative.    EKGs/Labs/Other Studies Reviewed:    Studies reviewed were summarized above. The additional studies were reviewed today:  Coronary CTA 05/10/2020: Aorta: Normal size. Mild aortic root and descending aorta calcifications. No dissection.  Aortic Valve:  Trileaflet.  There is mild calcifications.  Coronary Arteries:  Normal coronary origin.  Co-dominance.  RCA is a medium co-dominant artery that gives rise to PDA and  PLVB. There is severe (>70) calcified plaque in the proximal RCA. The mid portion with moderate (50-69%) calcified plaque.  Left main is a large artery that gives rise to LAD and LCX arteries. There is a moderate (50-69%) calcified plaque.  LAD is a large vessel. There is a segmental lesion in the proximal portion of the LAD- the first is short moderate (50-69%) calcified lesion and the second is the long severe (>70%) tubular lesion. The mid portion of the LAD with  mild (25-49%) calcified plaque. There another mid LAD plaque which is severe (>70%). The mid-distal LAD with moderate (50-69%) calcified plaque. D1 with a mild proximal calcified plaque.  LCX is a co-dominant artery that gives rise to one large OM1 branch. There is mild luminal irregularities.  Other findings:  Normal pulmonary vein drainage into the left atrium.  Normal left atrial appendage without a thrombus.  Normal size of the pulmonary artery.  IMPRESSION: 1. Coronary calcium score of 704. This was 91 percentile for age and sex matched control.  2. Severe two-vessel coronary artery disease. CADRADS 4A. Recommend Cardiac Catheterization.  3.  Normal coronary origin with Co-dominance.  4.  Aortic Atherosclerosis   EKG:  EKG is ordered today.  The EKG ordered today demonstrates NSR, 66 bpm, nonspecific anterior ST-T change  Recent Labs: 12/25/2019: ALT 17; Hemoglobin 13.6; Platelets 165 04/12/2020: BUN 13; Creatinine, Ser 0.75; Potassium 4.5; Sodium 138  Recent Lipid Panel    Component Value Date/Time   CHOL 164 09/08/2018 0811   CHOL 212 (H) 11/11/2017 1021   TRIG 154.0 (H) 09/08/2018 0811   HDL 65.30 09/08/2018 0811   HDL 70 11/11/2017 1021   CHOLHDL 3 09/08/2018 0811   VLDL 30.8 09/08/2018 0811   LDLCALC 68 09/08/2018 0811   LDLCALC 112 (H) 11/11/2017 1021    PHYSICAL EXAM:    VS:  BP 108/74 (BP Location: Left Arm, Patient Position: Sitting, Cuff Size: Normal)   Pulse 66   Ht 5'  1.5" (1.562 m)   Wt 183 lb 2 oz (83.1 kg)   SpO2 98%   BMI 34.04 kg/m   BMI: Body mass index is 34.04 kg/m.  Physical Exam  Constitutional: She is oriented to person, place, and time. She appears well-developed and well-nourished.  HENT:  Head: Normocephalic and atraumatic.  Eyes: Right eye exhibits no discharge. Left eye exhibits no discharge.  Neck: No JVD present.  Cardiovascular: Normal rate, regular rhythm, S1 normal, S2 normal and normal heart sounds. Exam reveals no distant heart sounds, no friction rub, no midsystolic click and no opening snap.  No murmur heard. Pulses:      Posterior tibial pulses are 2+ on the right side and 2+ on the left side.  Pulmonary/Chest: Effort normal and breath sounds normal. No respiratory distress. She has no decreased breath sounds. She has no wheezes. She has no rales. She exhibits no tenderness.  Abdominal: Soft. She exhibits no distension. There is no abdominal tenderness.  Musculoskeletal:        General: No edema.     Cervical back: Normal range of motion.  Neurological: She is alert and oriented to person, place, and time.  Skin: Skin is warm and dry. No cyanosis. Nails show no clubbing.  Psychiatric: She has a normal mood and affect. Her speech is normal and behavior is normal. Judgment and thought content normal.    Wt Readings from Last 3 Encounters:  05/18/20 183 lb 2 oz (83.1 kg)  02/10/20 185 lb (83.9 kg)  12/25/19 180 lb (81.6 kg)     ASSESSMENT & PLAN:   1. CAD involving the native coronary arteries with unstable angina: She is currently chest pain-free.  She continues to note exertional chest discomfort and dyspnea.  Coronary CTA with significant two-vessel coronary artery disease as outlined above.  Plan for diagnostic LHC at [facility] with Dr. Saunders Revel.  Continue current medications including aspirin, atorvastatin, diltiazem, ranolazine, and sublingual nitro as needed.  Aggressive risk factor modification including optimization  of lipid therapy as outlined below.  Risks and benefits of cardiac catheterization have been discussed with the patient including risks of bleeding, bruising, infection, kidney damage, stroke, heart attack, urgent need for bypass, injury to limb and death. The patient understands these risks and is willing to proceed with the procedure. All questions have been answered and concerns listened to.   2. HTN: Blood pressure is well controlled today and slightly on the soft side.  For now, continue current medications including, Lotensin HCT, Cardizem CD, and Lasix.  3. HLD: LDL 92 in 10/2019 with goal being < 70.  Check lipid panel and direct LDL today. Continue Lipitor 80 mg daily.  If LDL remains above goal would add Zetia 10 mg daily.  4. Right upper quadrant abdominal pain: Continue to follow-up with GI as advised.  Unable to provide further risk ratification regarding any potential GI evaluation at this time while we are awaiting diagnostic LHC.  Disposition: F/u with Dr. Saunders Revel or an APP 1 week after LHC.   Medication Adjustments/Labs and Tests Ordered: Current medicines are reviewed at length with the patient today.  Concerns regarding medicines are outlined above. Medication changes, Labs and Tests ordered today are summarized above and listed in the Patient Instructions accessible in Encounters.   Signed, Christell Faith, PA-C 05/18/2020 2:27 PM     [address], [place] [address] [phone]

## 2020-05-18 NOTE — H&P (View-Only) (Signed)
Cardiology Office Note    Date:  05/18/2020   ID:  Diane [name], DOB [birth year], MRN [id number]  PCP:  Shireen Quan, MD  Cardiologist:  Nelva Bush, MD  Electrophysiologist:  None   Chief Complaint: Follow-up  History of Present Illness:   Diane [name] is a 69 y.o. 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  She previously underwent diagnostic LHC in 09/2013 which showed nonobstructive disease including a normal left main, proximal LAD 30% stenosis, mid LAD 40% stenosis, LCx normal, proximal RCA 30% stenosis, LVEF 55 to 60%.  Myoview in 04/2017 showed an apical defect most consistent with shifting breast attenuation, though a small area of ischemia was unable to be excluded.  EF greater than 65%.  In follow-up in 11/2018 she reported feeling better after having lost 14 pounds with improved dyspnea and exercise capacity.  She was last seen in the office in 01/2020 noting increased anxiety/panic attacks following the diagnosis of her son's metastatic colon cancer.  She noted chest discomfort that presented when she became nervous or anxious as well as some degree of chronic exertional dyspnea.  Symptoms were occurring approximately 3 times per week.  In this setting, she underwent coronary CTA on 05/10/2020 which showed significant two-vessel CAD involving a large LAD and medium codominant RCA as outlined below.  She comes in today noting continued exertional chest discomfort and shortness of breath that improves with rest and is mostly unchanged when compared to her last visit.  She also noted some mild dizziness this morning while getting out of the shower that lasted for a second or 2.  She is currently chest pain-free.  She is tolerating all medications without issues.  Of note, she is also in the process of GI evaluation for  right upper quadrant pain with follow-up with GI next week.   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 01/2013 Echo: EF 55-60%, no rwma, Gr1 DD, PASP 58mmHg; b. 04/2017 Echo: EF 65-70%, no rwma, Gr1 DD.   GERD (gastroesophageal reflux disease)    Hyperlipidemia    Hypertension    Non-obstructive CAD (coronary artery disease)    a. 09/2013 Cath: LM nl, LAD 30p, 2m, LCX nl, RCA 30p, EF 55-60%; b. 04/2017 MV: EF>65%, apical defect - most consistent w/ shifting breast attenuation. Small area of isch cannot be excluded.   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/2015   basal cell   Stroke [facility]) 2006   deaf in right ear    Past Surgical History:  Procedure Laterality Date   BREAST BIOPSY Left 1984   abcess   [place]   CHONDROPLASTY Left 01/05/2015   Procedure: CHONDROPLASTY;  Surgeon: Yvette Rack., MD;  Location: [place];  Service: Orthopedics;  Laterality: Left;   KNEE ARTHROSCOPY WITH MEDIAL MENISECTOMY  Left 01/05/2015   Procedure: LEFT KNEE ARTHROSCOPY WITH MEDIAL AND LATERAL MENISECTOMY; DEBRIDEMENT LATERAL PATELLA FEMORAL ;  Surgeon: Yvette Rack., MD;  Location: [place];  Service: Orthopedics;  Laterality: Left;   LEFT HEART CATHETERIZATION WITH CORONARY ANGIOGRAM N/A 10/28/2013   Procedure: LEFT HEART CATHETERIZATION WITH CORONARY ANGIOGRAM;  Surgeon: Larey Dresser, MD;  Location: [facility] CATH LAB;  Service: Cardiovascular;  Laterality: N/A;   TOTAL KNEE ARTHROPLASTY Left 07/06/2015   Procedure: TOTAL KNEE  ARTHROPLASTY;  Surgeon: Earlie Server, MD;  Location: [place];  Service: Orthopedics;  Laterality: Left;   TUBAL LIGATION      Current Medications: Current Meds  Medication Sig   aspirin EC 81 MG tablet Take 1 tablet (81 mg total) by mouth daily.   atorvastatin (LIPITOR) 80 MG tablet Take 1 tablet (80 mg total) by mouth daily. PLEASE CONTACT OFFICE [phone] TO SCHEDULE APPOINTMENT FOR FUTURE REFILLS   benazepril-hydrochlorthiazide (LOTENSIN HCT) 20-12.5 MG tablet Take 1 tablet by mouth daily.   budesonide-formoterol (SYMBICORT) 80-4.5 MCG/ACT inhaler Inhale 2 puffs into the lungs 2 (two) times daily.   butalbital-acetaminophen-caffeine (FIORICET WITH CODEINE) 50-325-40-30 MG capsule TAKE 1 TO 2 CAPSULES BY MOUTH DAILY AS NEEDED FOR HEADACHE   cholecalciferol (VITAMIN D) 1000 UNITS tablet Take 1,000 Units by mouth daily.   diazepam (VALIUM) 5 MG tablet Take 1 tablet (5 mg total) by mouth every 6 (six) hours as needed. for anxiety   diclofenac sodium (VOLTAREN) 1 % GEL Apply 2 g 4 (four) times daily topically.   diltiazem (CARDIZEM CD) 180 MG 24 hr capsule TAKE 1 CAPSULE BY MOUTH EVERY DAY   fluticasone (FLONASE) 50 MCG/ACT nasal spray SPRAY 2 SPRAYS INTO EACH NOSTRIL EVERY DAY   furosemide (LASIX) 20 MG tablet Take 1 tablet (20 mg total) by mouth daily.   nitroGLYCERIN (NITROSTAT) 0.4 MG SL tablet Place 1 tablet (0.4 mg total) under the tongue every 5 (five) minutes as needed for chest pain. Maximum of 3 doses.   PROAIR HFA 108 (90 Base) MCG/ACT inhaler INHALE 1 TO 2 PUFFS INTO THE LUNGS EVERY 6 HOURS AS NEEDED FOR WHEEZING OR SHORTNESS OF BREATH   ranolazine (RANEXA) 500 MG 12 hr tablet TAKE 1 TABLET(500 MG) BY MOUTH TWICE DAILY   sertraline (ZOLOFT) 100 MG tablet Take 1qd (Plz sched visit with new provider for future fills)   traMADol (ULTRAM) 50 MG tablet Take 1 tablet (50 mg total) by mouth every 8 (eight) hours as needed.   traZODone (DESYREL) 150 MG tablet Take 1  tablet (150 mg total) by mouth at bedtime as needed. for sleep   valACYclovir (VALTREX) 1000 MG tablet TAKE 1 TABLET BY MOUTH TWICE DAILY (Patient taking differently: as needed. 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  Lives with fiancee in [place].  Moved here recently from [place], [place].     She 8 children- ages 103- 63.               Social Determinants of Health   Financial Resource Strain:    Difficulty of Paying Living Expenses:   Food Insecurity:    Worried About Charity fundraiser in the Last Year:    Arboriculturist in the Last Year:   Transportation Needs:    Film/video editor (Medical):    Lack of Transportation (Non-Medical):   Physical Activity:    Days of Exercise per Week:    Minutes of Exercise per Session:   Stress:    Feeling of Stress :   Social Connections:    Frequency of Communication with Friends and Family:    Frequency of Social Gatherings with Friends and Family:    Attends Religious Services:    Active Member of Clubs or Organizations:    Attends Music therapist:    Marital Status:      Family History:  The patient's family history includes Healthy in her  daughter, daughter, daughter, daughter, mother, son, son, son, and son; Heart disease in her father; Heart failure in her father, maternal grandfather, and maternal grandmother; Hypertension in her mother, son, and son; Juvenile Diabetes in her son; Liver cancer in her paternal grandfather; Ovarian cancer in her paternal grandmother; Stroke in her father, maternal grandmother, and mother. There is no history of Colon cancer, Stomach cancer, Rectal cancer, Esophageal cancer, or Breast cancer.  ROS:   Review of Systems  Constitutional: Positive for malaise/fatigue. Negative for chills, diaphoresis, fever and weight loss.  HENT: Negative for congestion.   Eyes: Negative for discharge and redness.  Respiratory: Positive for shortness of breath. Negative for cough, sputum production and wheezing.   Cardiovascular: Positive for chest pain. Negative for palpitations, orthopnea, claudication, leg swelling and PND.  Gastrointestinal: Positive for abdominal pain. Negative for blood in stool, heartburn, nausea and vomiting.       Right upper quadrant abdominal pain  Musculoskeletal: Negative for falls and myalgias.  Skin: Negative for rash.  Neurological: Positive for weakness. Negative for dizziness, tingling, tremors, sensory change, speech change, focal weakness and loss of consciousness.  Endo/Heme/Allergies: Does not bruise/bleed easily.  Psychiatric/Behavioral: Negative for substance abuse. The patient is not nervous/anxious.   All other systems reviewed and are negative.    EKGs/Labs/Other Studies Reviewed:    Studies reviewed were summarized above. The additional studies were reviewed today:  Coronary CTA 05/10/2020: Aorta: Normal size. Mild aortic root and descending aorta calcifications. No dissection.  Aortic Valve:  Trileaflet.  There is mild calcifications.  Coronary Arteries:  Normal coronary origin.  Co-dominance.  RCA is a medium co-dominant artery that gives rise to PDA and  PLVB. There is severe (>70) calcified plaque in the proximal RCA. The mid portion with moderate (50-69%) calcified plaque.  Left main is a large artery that gives rise to LAD and LCX arteries. There is a moderate (50-69%) calcified plaque.  LAD is a large vessel. There is a segmental lesion in the proximal portion of the LAD- the first is short moderate (50-69%) calcified lesion and the second is the long severe (>70%) tubular lesion. The mid portion of the LAD with  mild (25-49%) calcified plaque. There another mid LAD plaque which is severe (>70%). The mid-distal LAD with moderate (50-69%) calcified plaque. D1 with a mild proximal calcified plaque.  LCX is a co-dominant artery that gives rise to one large OM1 branch. There is mild luminal irregularities.  Other findings:  Normal pulmonary vein drainage into the left atrium.  Normal left atrial appendage without a thrombus.  Normal size of the pulmonary artery.  IMPRESSION: 1. Coronary calcium score of 704. This was 81 percentile for age and sex matched control.  2. Severe two-vessel coronary artery disease. CADRADS 4A. Recommend Cardiac Catheterization.  3.  Normal coronary origin with Co-dominance.  4.  Aortic Atherosclerosis   EKG:  EKG is ordered today.  The EKG ordered today demonstrates NSR, 66 bpm, nonspecific anterior ST-T change  Recent Labs: 12/25/2019: ALT 17; Hemoglobin 13.6; Platelets 165 04/12/2020: BUN 13; Creatinine, Ser 0.75; Potassium 4.5; Sodium 138  Recent Lipid Panel    Component Value Date/Time   CHOL 164 09/08/2018 0811   CHOL 212 (H) 11/11/2017 1021   TRIG 154.0 (H) 09/08/2018 0811   HDL 65.30 09/08/2018 0811   HDL 70 11/11/2017 1021   CHOLHDL 3 09/08/2018 0811   VLDL 30.8 09/08/2018 0811   LDLCALC 68 09/08/2018 0811   LDLCALC 112 (H) 11/11/2017 1021    PHYSICAL EXAM:    VS:  BP 108/74 (BP Location: Left Arm, Patient Position: Sitting, Cuff Size: Normal)    Pulse 66    Ht 5'  1.5" (1.562 m)    Wt 183 lb 2 oz (83.1 kg)    SpO2 98%    BMI 34.04 kg/m   BMI: Body mass index is 34.04 kg/m.  Physical Exam  Constitutional: She is oriented to person, place, and time. She appears well-developed and well-nourished.  HENT:  Head: Normocephalic and atraumatic.  Eyes: Right eye exhibits no discharge. Left eye exhibits no discharge.  Neck: No JVD present.  Cardiovascular: Normal rate, regular rhythm, S1 normal, S2 normal and normal heart sounds. Exam reveals no distant heart sounds, no friction rub, no midsystolic click and no opening snap.  No murmur heard. Pulses:      Posterior tibial pulses are 2+ on the right side and 2+ on the left side.  Pulmonary/Chest: Effort normal and breath sounds normal. No respiratory distress. She has no decreased breath sounds. She has no wheezes. She has no rales. She exhibits no tenderness.  Abdominal: Soft. She exhibits no distension. There is no abdominal tenderness.  Musculoskeletal:        General: No edema.     Cervical back: Normal range of motion.  Neurological: She is alert and oriented to person, place, and time.  Skin: Skin is warm and dry. No cyanosis. Nails show no clubbing.  Psychiatric: She has a normal mood and affect. Her speech is normal and behavior is normal. Judgment and thought content normal.    Wt Readings from Last 3 Encounters:  05/18/20 183 lb 2 oz (83.1 kg)  02/10/20 185 lb (83.9 kg)  12/25/19 180 lb (81.6 kg)     ASSESSMENT & PLAN:   1. CAD involving the native coronary arteries with unstable angina: She is currently chest pain-free.  She continues to note exertional chest discomfort and dyspnea.  Coronary CTA with significant two-vessel coronary artery disease as outlined above.  Plan for diagnostic LHC at [facility] with Dr. Saunders Revel.  Continue current medications including aspirin, atorvastatin, diltiazem, ranolazine, and sublingual nitro as needed.  Aggressive  risk factor modification including optimization  of lipid therapy as outlined below.  Risks and benefits of cardiac catheterization have been discussed with the patient including risks of bleeding, bruising, infection, kidney damage, stroke, heart attack, urgent need for bypass, injury to limb and death. The patient understands these risks and is willing to proceed with the procedure. All questions have been answered and concerns listened to.   2. HTN: Blood pressure is well controlled today and slightly on the soft side.  For now, continue current medications including, Lotensin HCT, Cardizem CD, and Lasix.  3. HLD: LDL 92 in 10/2019 with goal being < 70.  Check lipid panel and direct LDL today. Continue Lipitor 80 mg daily.  If LDL remains above goal would add Zetia 10 mg daily.  4. Right upper quadrant abdominal pain: Continue to follow-up with GI as advised.  Unable to provide further risk ratification regarding any potential GI evaluation at this time while we are awaiting diagnostic LHC.  Disposition: F/u with Dr. Saunders Revel or an APP 1 week after LHC.   Medication Adjustments/Labs and Tests Ordered: Current medicines are reviewed at length with the patient today.  Concerns regarding medicines are outlined above. Medication changes, Labs and Tests ordered today are summarized above and listed in the Patient Instructions accessible in Encounters.   Signed, Christell Faith, PA-C 05/18/2020 2:27 PM     [address], [place] [address] [phone]

## 2020-05-19 LAB — BASIC METABOLIC PANEL
BUN/Creatinine Ratio: 19 (ref 12–28)
BUN: 15 mg/dL (ref 8–27)
CO2: 27 mmol/L (ref 20–29)
Calcium: 9.6 mg/dL (ref 8.7–10.3)
Chloride: 96 mmol/L (ref 96–106)
Creatinine, Ser: 0.79 mg/dL (ref 0.57–1.00)
GFR calc Af Amer: 90 mL/min/{1.73_m2} (ref >59)
GFR calc non Af Amer: 78 mL/min/{1.73_m2} (ref >59)
Glucose: 91 mg/dL (ref 65–99)
Potassium: 3.9 mmol/L (ref 3.5–5.2)
Sodium: 138 mmol/L (ref 134–144)

## 2020-05-19 LAB — CBC WITH DIFFERENTIAL/PLATELET
Basophils Absolute: 0 10*3/uL (ref 0.0–0.2)
Basos: 1 %
EOS (ABSOLUTE): 0.2 10*3/uL (ref 0.0–0.4)
Eos: 3 %
Hematocrit: 44.2 % (ref 34.0–46.6)
Hemoglobin: 15 g/dL (ref 11.1–15.9)
Immature Grans (Abs): 0 10*3/uL (ref 0.0–0.1)
Immature Granulocytes: 0 %
Lymphocytes Absolute: 1.6 10*3/uL (ref 0.7–3.1)
Lymphs: 32 %
MCH: 32.9 pg (ref 26.6–33.0)
MCHC: 33.9 g/dL (ref 31.5–35.7)
MCV: 97 fL (ref 79–97)
Monocytes Absolute: 0.3 10*3/uL (ref 0.1–0.9)
Monocytes: 6 %
Neutrophils Absolute: 2.9 10*3/uL (ref 1.4–7.0)
Neutrophils: 58 %
Platelets: 184 10*3/uL (ref 150–450)
RBC: 4.56 x10E6/uL (ref 3.77–5.28)
RDW: 12.3 % (ref 11.7–15.4)
WBC: 5 10*3/uL (ref 3.4–10.8)

## 2020-05-19 LAB — LIPID PANEL
Chol/HDL Ratio: 2.9 ratio (ref 0.0–4.4)
Cholesterol, Total: 177 mg/dL (ref 100–199)
HDL: 62 mg/dL (ref >39)
LDL Chol Calc (NIH): 92 mg/dL (ref 0–99)
Triglycerides: 133 mg/dL (ref 0–149)
VLDL Cholesterol Cal: 23 mg/dL (ref 5–40)

## 2020-05-19 LAB — LDL CHOLESTEROL, DIRECT: LDL Direct: 94 mg/dL (ref 0–99)

## 2020-05-21 ENCOUNTER — Other Ambulatory Visit: Payer: Self-pay

## 2020-05-21 MED ORDER — ATORVASTATIN CALCIUM 80 MG PO TABS: 80.0000 mg | ORAL_TABLET | Freq: Every day | ORAL | 0 refills | Status: DC

## 2020-05-30 ENCOUNTER — Telehealth: Payer: Self-pay

## 2020-05-30 ENCOUNTER — Other Ambulatory Visit
Admission: RE | Admit: 2020-05-30 | Discharge: 2020-05-30 | Disposition: A | Discharge status: Home / Self Care | Payer: Medicare HMO | Source: Ambulatory Visit | Attending: Internal Medicine | Admitting: Internal Medicine

## 2020-05-30 ENCOUNTER — Other Ambulatory Visit: Payer: Self-pay

## 2020-05-30 DIAGNOSIS — Z20822 Contact with and (suspected) exposure to covid-19: Secondary | ICD-10-CM | POA: Insufficient documentation

## 2020-05-30 DIAGNOSIS — Z01812 Encounter for preprocedural laboratory examination: Secondary | ICD-10-CM | POA: Diagnosis present

## 2020-05-30 LAB — SARS CORONAVIRUS 2 (TAT 6-24 HRS): SARS Coronavirus 2: NEGATIVE

## 2020-05-30 NOTE — Telephone Encounter (Signed)
Spoke with the pt and she verbalized her cath instructions and 5:30 and arrival time.   Will update previous message.

## 2020-05-30 NOTE — Telephone Encounter (Addendum)
LMTCB RE:   Pre-catheterization scheduled at [facility] for: 06/01/20 Verified arrival time and place: [facility]) at: 5:30 am.   No solid food after midnight prior to cath, clear liquids until 5 AM day of procedure. Contrast allergy: NO  DO NOT TAKE ANY DIURETICS THE MORNING OF THE PROCEDURE. (Lotensin/ HCTZ and Lasix)   AM meds can be  taken pre-cath with sip of water including: ASA 81 mg   Confirm patient has responsible adult to drive home post procedure and observe 24 hours after arriving home:   You are allowed ONE visitor in the waiting room during your procedure. Both you and your visitor must wear masks.      COVID-19 Pre-Screening Questions:  . In the past 7 to 10 days have you had a cough,  shortness of breath, headache, congestion, fever (100 or greater) body aches, chills, sore throat, or sudden loss of taste or sense of smell? NO . Have you been around anyone with known Covid 19 in the past 7 to 10 days? NO . Have you been around anyone who is awaiting Covid 19 test results in the past 7 to 10 days? NO . Have you been around anyone who has mentioned symptoms of Covid 19 within the past 7 to 10 days? NO

## 2020-06-01 ENCOUNTER — Encounter (HOSPITAL_COMMUNITY): Payer: Self-pay | Admitting: Internal Medicine

## 2020-06-01 ENCOUNTER — Ambulatory Visit (HOSPITAL_COMMUNITY)
Admission: RE | Admit: 2020-06-01 | Discharge: 2020-06-01 | Disposition: A | Discharge status: Home / Self Care | Payer: Medicare HMO | Attending: Internal Medicine | Admitting: Internal Medicine

## 2020-06-01 ENCOUNTER — Encounter (HOSPITAL_COMMUNITY)
Admission: RE | Disposition: A | Discharge status: Home / Self Care | Payer: Self-pay | Source: Home / Self Care | Attending: Internal Medicine

## 2020-06-01 DIAGNOSIS — I110 Hypertensive heart disease with heart failure: Secondary | ICD-10-CM | POA: Diagnosis not present

## 2020-06-01 DIAGNOSIS — Z8249 Family history of ischemic heart disease and other diseases of the circulatory system: Secondary | ICD-10-CM | POA: Insufficient documentation

## 2020-06-01 DIAGNOSIS — Z955 Presence of coronary angioplasty implant and graft: Secondary | ICD-10-CM | POA: Diagnosis not present

## 2020-06-01 DIAGNOSIS — F419 Anxiety disorder, unspecified: Secondary | ICD-10-CM | POA: Insufficient documentation

## 2020-06-01 DIAGNOSIS — I25119 Atherosclerotic heart disease of native coronary artery with unspecified angina pectoris: Secondary | ICD-10-CM | POA: Diagnosis not present

## 2020-06-01 DIAGNOSIS — I25110 Atherosclerotic heart disease of native coronary artery with unstable angina pectoris: Secondary | ICD-10-CM | POA: Diagnosis not present

## 2020-06-01 DIAGNOSIS — Z8673 Personal history of transient ischemic attack (TIA), and cerebral infarction without residual deficits: Secondary | ICD-10-CM | POA: Diagnosis not present

## 2020-06-01 DIAGNOSIS — R0609 Other forms of dyspnea: Secondary | ICD-10-CM | POA: Diagnosis not present

## 2020-06-01 DIAGNOSIS — Z7982 Long term (current) use of aspirin: Secondary | ICD-10-CM | POA: Insufficient documentation

## 2020-06-01 DIAGNOSIS — I700 Atherosclerosis of aorta: Secondary | ICD-10-CM | POA: Insufficient documentation

## 2020-06-01 DIAGNOSIS — I2510 Atherosclerotic heart disease of native coronary artery without angina pectoris: Secondary | ICD-10-CM | POA: Diagnosis present

## 2020-06-01 DIAGNOSIS — E785 Hyperlipidemia, unspecified: Secondary | ICD-10-CM | POA: Insufficient documentation

## 2020-06-01 DIAGNOSIS — F329 Major depressive disorder, single episode, unspecified: Secondary | ICD-10-CM | POA: Diagnosis not present

## 2020-06-01 DIAGNOSIS — Z9861 Coronary angioplasty status: Secondary | ICD-10-CM

## 2020-06-01 DIAGNOSIS — Z7951 Long term (current) use of inhaled steroids: Secondary | ICD-10-CM | POA: Diagnosis not present

## 2020-06-01 DIAGNOSIS — I5032 Chronic diastolic (congestive) heart failure: Secondary | ICD-10-CM | POA: Diagnosis not present

## 2020-06-01 DIAGNOSIS — H9191 Unspecified hearing loss, right ear: Secondary | ICD-10-CM | POA: Diagnosis not present

## 2020-06-01 DIAGNOSIS — Z79899 Other long term (current) drug therapy: Secondary | ICD-10-CM | POA: Insufficient documentation

## 2020-06-01 DIAGNOSIS — R1011 Right upper quadrant pain: Secondary | ICD-10-CM | POA: Diagnosis not present

## 2020-06-01 DIAGNOSIS — K219 Gastro-esophageal reflux disease without esophagitis: Secondary | ICD-10-CM | POA: Diagnosis not present

## 2020-06-01 DIAGNOSIS — I25118 Atherosclerotic heart disease of native coronary artery with other forms of angina pectoris: Secondary | ICD-10-CM | POA: Diagnosis present

## 2020-06-01 HISTORY — PX: LEFT HEART CATH AND CORONARY ANGIOGRAPHY: CATH118249

## 2020-06-01 HISTORY — PX: INTRAVASCULAR PRESSURE WIRE/FFR STUDY: CATH118243

## 2020-06-01 HISTORY — PX: CORONARY STENT INTERVENTION: CATH118234

## 2020-06-01 LAB — POCT ACTIVATED CLOTTING TIME
Activated Clotting Time: 224 seconds
Activated Clotting Time: 241 seconds
Activated Clotting Time: 252 seconds
Activated Clotting Time: 257 seconds

## 2020-06-01 SURGERY — LEFT HEART CATH AND CORONARY ANGIOGRAPHY
Anesthesia: LOCAL

## 2020-06-01 MED ORDER — LABETALOL HCL 5 MG/ML IV SOLN: 10.0000 mg | INTRAVENOUS | Status: AC | PRN

## 2020-06-01 MED ORDER — ASPIRIN 81 MG PO CHEW: 81.0000 mg | CHEWABLE_TABLET | Freq: Every day | ORAL | Status: DC

## 2020-06-01 MED ORDER — CLOPIDOGREL BISULFATE 300 MG PO TABS
ORAL_TABLET | ORAL | Status: DC | PRN
  Administered 2020-06-01: 600 mg via ORAL

## 2020-06-01 MED ORDER — FENTANYL CITRATE (PF) 100 MCG/2ML IJ SOLN
INTRAMUSCULAR | Status: DC | PRN
  Administered 2020-06-01 (×2): 25 ug via INTRAVENOUS

## 2020-06-01 MED ORDER — HEPARIN SODIUM (PORCINE) 1000 UNIT/ML IJ SOLN
INTRAMUSCULAR | Status: DC | PRN
  Administered 2020-06-01: 5000 [IU] via INTRAVENOUS
  Administered 2020-06-01: 2000 [IU] via INTRAVENOUS
  Administered 2020-06-01: 3000 [IU] via INTRAVENOUS
  Administered 2020-06-01: 4000 [IU] via INTRAVENOUS
  Administered 2020-06-01: 2000 [IU] via INTRAVENOUS

## 2020-06-01 MED ORDER — NITROGLYCERIN 1 MG/10 ML FOR IR/CATH LAB
INTRA_ARTERIAL | Status: AC
  Filled 2020-06-01: qty 10

## 2020-06-01 MED ORDER — SODIUM CHLORIDE 0.9% FLUSH: 3.0000 mL | Freq: Two times a day (BID) | INTRAVENOUS | Status: DC

## 2020-06-01 MED ORDER — FENTANYL CITRATE (PF) 100 MCG/2ML IJ SOLN
INTRAMUSCULAR | Status: AC
  Filled 2020-06-01: qty 2

## 2020-06-01 MED ORDER — FAMOTIDINE IN NACL 20-0.9 MG/50ML-% IV SOLN
INTRAVENOUS | Status: AC
  Filled 2020-06-01: qty 50

## 2020-06-01 MED ORDER — HEPARIN (PORCINE) IN NACL 1000-0.9 UT/500ML-% IV SOLN
INTRAVENOUS | Status: AC
  Filled 2020-06-01: qty 1000

## 2020-06-01 MED ORDER — SODIUM CHLORIDE 0.9 % IV SOLN: INTRAVENOUS | Status: AC

## 2020-06-01 MED ORDER — LIDOCAINE HCL (PF) 1 % IJ SOLN
INTRAMUSCULAR | Status: DC | PRN
  Administered 2020-06-01: 1 mL

## 2020-06-01 MED ORDER — HEPARIN SODIUM (PORCINE) 1000 UNIT/ML IJ SOLN
INTRAMUSCULAR | Status: AC
  Filled 2020-06-01: qty 1

## 2020-06-01 MED ORDER — SODIUM CHLORIDE 0.9 % WEIGHT BASED INFUSION
3.0000 mL/kg/h | INTRAVENOUS | Status: AC
  Administered 2020-06-01: 3 mL/kg/h via INTRAVENOUS

## 2020-06-01 MED ORDER — SODIUM CHLORIDE 0.9 % IV SOLN: INTRAVENOUS | Status: AC | PRN

## 2020-06-01 MED ORDER — SODIUM CHLORIDE 0.9 % IV SOLN: 250.0000 mL | INTRAVENOUS | Status: DC | PRN

## 2020-06-01 MED ORDER — MIDAZOLAM HCL 2 MG/2ML IJ SOLN
INTRAMUSCULAR | Status: AC
  Filled 2020-06-01: qty 2

## 2020-06-01 MED ORDER — MIDAZOLAM HCL 2 MG/2ML IJ SOLN
INTRAMUSCULAR | Status: DC | PRN
  Administered 2020-06-01 (×2): 1 mg via INTRAVENOUS

## 2020-06-01 MED ORDER — CLOPIDOGREL BISULFATE 75 MG PO TABS: 75.0000 mg | ORAL_TABLET | Freq: Every day | ORAL | Status: DC

## 2020-06-01 MED ORDER — ASPIRIN 81 MG PO CHEW: 81.0000 mg | CHEWABLE_TABLET | ORAL | Status: DC

## 2020-06-01 MED ORDER — CLOPIDOGREL BISULFATE 300 MG PO TABS
ORAL_TABLET | ORAL | Status: AC
  Filled 2020-06-01: qty 2

## 2020-06-01 MED ORDER — VERAPAMIL HCL 2.5 MG/ML IV SOLN
INTRAVENOUS | Status: DC | PRN
  Administered 2020-06-01: 10 mL via INTRA_ARTERIAL

## 2020-06-01 MED ORDER — NITROGLYCERIN 1 MG/10 ML FOR IR/CATH LAB
INTRA_ARTERIAL | Status: DC | PRN
  Administered 2020-06-01 (×2): 100 ug via INTRACORONARY

## 2020-06-01 MED ORDER — ACETAMINOPHEN 325 MG PO TABS: 650.0000 mg | ORAL_TABLET | ORAL | Status: DC | PRN

## 2020-06-01 MED ORDER — SODIUM CHLORIDE 0.9 % WEIGHT BASED INFUSION: 1.0000 mL/kg/h | INTRAVENOUS | Status: DC

## 2020-06-01 MED ORDER — ONDANSETRON HCL 4 MG/2ML IJ SOLN: 4.0000 mg | Freq: Four times a day (QID) | INTRAMUSCULAR | Status: DC | PRN

## 2020-06-01 MED ORDER — VERAPAMIL HCL 2.5 MG/ML IV SOLN
INTRAVENOUS | Status: AC
  Filled 2020-06-01: qty 2

## 2020-06-01 MED ORDER — SODIUM CHLORIDE 0.9% FLUSH: 3.0000 mL | INTRAVENOUS | Status: DC | PRN

## 2020-06-01 MED ORDER — CLOPIDOGREL BISULFATE 75 MG PO TABS
75.0000 mg | ORAL_TABLET | Freq: Every day | ORAL | 0 refills | Status: DC
Start: 2020-06-01 — End: 2020-06-07

## 2020-06-01 MED ORDER — FAMOTIDINE IN NACL 20-0.9 MG/50ML-% IV SOLN
INTRAVENOUS | Status: AC | PRN
  Administered 2020-06-01: 20 mg via INTRAVENOUS

## 2020-06-01 MED ORDER — IOHEXOL 350 MG/ML SOLN
INTRAVENOUS | Status: DC | PRN
  Administered 2020-06-01: 135 mL

## 2020-06-01 MED ORDER — HEPARIN (PORCINE) IN NACL 1000-0.9 UT/500ML-% IV SOLN
INTRAVENOUS | Status: DC | PRN
  Administered 2020-06-01 (×2): 500 mL

## 2020-06-01 MED ORDER — LIDOCAINE HCL (PF) 1 % IJ SOLN
INTRAMUSCULAR | Status: AC
  Filled 2020-06-01: qty 30

## 2020-06-01 MED ORDER — HYDRALAZINE HCL 20 MG/ML IJ SOLN: 10.0000 mg | INTRAMUSCULAR | Status: AC | PRN

## 2020-06-01 MED FILL — CLOPIDOGREL 75 MG TABLET: 75 | 30 days supply | Qty: 30 | Fill #0

## 2020-06-01 SURGICAL SUPPLY — 22 items
BALLN EMERGE MR 2.0X8 (BALLOONS) ×2
BALLN WOLVERINE 2.25X6 (BALLOONS) ×2
BALLN ~~LOC~~ EMERGE MR 2.75X6 (BALLOONS) ×2
BALLOON EMERGE MR 2.0X8 (BALLOONS) ×1 IMPLANT
BALLOON WOLVERINE 2.25X6 (BALLOONS) ×1 IMPLANT
BALLOON ~~LOC~~ EMERGE MR 2.75X6 (BALLOONS) ×1 IMPLANT
CATH 5FR JL3.5 JR4 ANG PIG MP (CATHETERS) ×2 IMPLANT
CATH LAUNCHER 5F JR4 (CATHETERS) ×2 IMPLANT
DEVICE RAD COMP TR BAND LRG (VASCULAR PRODUCTS) ×2 IMPLANT
GLIDESHEATH SLEND SS 6F .021 (SHEATH) ×2 IMPLANT
GUIDEWIRE INQWIRE 1.5J.035X260 (WIRE) ×1 IMPLANT
GUIDEWIRE PRESSURE COMET II (WIRE) ×2 IMPLANT
INQWIRE 1.5J .035X260CM (WIRE) ×2
KIT ENCORE 26 ADVANTAGE (KITS) ×2 IMPLANT
KIT ESSENTIALS PG (KITS) ×2 IMPLANT
KIT HEART LEFT (KITS) ×2 IMPLANT
PACK CARDIAC CATHETERIZATION (CUSTOM PROCEDURE TRAY) ×2 IMPLANT
STENT SYNERGY XD 2.50X12 (Permanent Stent) ×1 IMPLANT
SYNERGY XD 2.50X12 (Permanent Stent) ×2 IMPLANT
SYR MEDRAD MARK 7 150ML (SYRINGE) ×2 IMPLANT
TRANSDUCER W/STOPCOCK (MISCELLANEOUS) ×2 IMPLANT
TUBING CIL FLEX 10 FLL-RA (TUBING) ×2 IMPLANT

## 2020-06-01 NOTE — Discharge Summary (Signed)
Discharge Summary for Same Day PCI   Patient ID: Diane [name] MRN: [id number]; DOB: January 03, 1951  Admit date: 06/01/2020 Discharge date: 06/01/2020  Primary Care Provider: Shireen Quan, MD  Primary Cardiologist: Nelva Bush, MD  Primary Electrophysiologist:  None   Discharge Diagnoses    Principal Problem:   Coronary artery disease involving native coronary artery of native heart with angina pectoris [facility])  Diagnostic Studies/Procedures    Cardiac Catheterization 06/01/2020:   FINDINGS: 1. Severe single-vessel CAD with 80% proximal RCA stenosis that is hemodynamically significant by DFR (0.88). 2. Mild to moderate, non-obstructive CAD involving the LAD and LCx. 3. Hyperdynamic left ventricular systolic function with normal filling pressure. 4. Successful PCI to proximal RCA using Syergy 2.5 x 12 mm DES with 0% residual stenosis and TIMI-3 flow.  RECOMMENDATIONS: 1. DAPT with ASA and clopidogrel for at least 6 months. 2. Aggressive secondary prevention. 3. Anticipate same-day discharge if no post-cath complications.  Nelva Bush, MD [facility] _____________   History of Present Illness     Diane [name] is a 69 y.o. 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  She previously underwent diagnostic LHC in 09/2013 which showed nonobstructive disease including a normal left main, proximal LAD 30% stenosis, mid LAD 40% stenosis, LCx normal, proximal RCA 30% stenosis, LVEF 55 to 60%.  Myoview in 04/2017 showed an apical defect most consistent with shifting breast attenuation, though a small area of ischemia was unable to be excluded.  EF greater than 65%.  In follow-up in 11/2018 she reported feeling better after having lost 14 pounds with improved dyspnea and exercise capacity.  She was  last seen in the office in 01/2020 noting increased anxiety/panic attacks following the diagnosis of her son's metastatic colon cancer.  She noted chest discomfort that presented when she became nervous or anxious as well as some degree of chronic exertional dyspnea.  Symptoms were occurring approximately 3 times per week.  In this setting, she underwent coronary CTA on 05/10/2020 which showed significant two-vessel CAD involving a large LAD and medium codominant RCA.  She presented to the office for follow up noting continued exertional chest discomfort and shortness of breath that improved with rest and was mostly unchanged when compared to her last visit.  She also noted some mild dizziness that morning while getting out of the shower that lasted for a second or 2.    Outpatient cardiac catheterization was arranged for further evaluation.  Hospital Course     The patient underwent cardiac cath as noted above with severe single vessel CAD of 80% lesion in the pRCA + via DFR (0.88). Successful PCI/DES to the pRCA. Plan for DAPT with ASA/plavix for at least 6 months. The patient was seen by cardiac rehab while in short stay. There were no observed complications post cath. Radial cath site was re-evaluated prior to discharge and found to be stable without any complications.   Diane [name] was seen by Dr. Saunders Revel and determined stable for discharge home. Follow up with our office has been arranged. Instructions/precautions regarding cath site care were given prior to discharge. Medications are listed below. Pertinent changes include addition of plavix.  _____________  Did the patient have an acute coronary syndrome (MI, NSTEMI, STEMI, etc) this admission?:  No  Did the patient have a percutaneous coronary intervention (stent / angioplasty)?:  Yes.     Cath/PCI Registry Performance & Quality Measures: 4. Aspirin prescribed? - Yes 5. ADP Receptor Inhibitor  (Plavix/Clopidogrel, Brilinta/Ticagrelor or Effient/Prasugrel) prescribed (includes medically managed patients)? - Yes 6. High Intensity Statin (Lipitor 40-80mg  or Crestor 20-40mg ) prescribed? - Yes 7. For EF <40%, was ACEI/ARB prescribed? - Yes 8. For EF <40%, Aldosterone Antagonist (Spironolactone or Eplerenone) prescribed? - Not Applicable (EF >/= 10%) 9. Cardiac Rehab Phase II ordered? - Yes   _____________   Discharge Vitals Blood pressure (!) 89/63, pulse 70, temperature (!) 97.2 F (36.2 C), temperature source Skin, resp. rate 20, height 5' 1.5" (1.562 m), weight 85.3 kg, SpO2 97 %.  Filed Weights   06/01/20 0548  Weight: 85.3 kg    Last Labs & Radiologic Studies    CBC No results for input(s): WBC, NEUTROABS, HGB, HCT, MCV, PLT in the last 72 hours. Basic Metabolic Panel No results for input(s): NA, K, CL, CO2, GLUCOSE, BUN, CREATININE, CALCIUM, MG, PHOS in the last 72 hours. Liver Function Tests No results for input(s): AST, ALT, ALKPHOS, BILITOT, PROT, ALBUMIN in the last 72 hours. No results for input(s): LIPASE, AMYLASE in the last 72 hours. High Sensitivity Troponin:   No results for input(s): TROPONINIHS in the last 720 hours.  BNP Invalid input(s): POCBNP D-Dimer No results for input(s): DDIMER in the last 72 hours. Hemoglobin A1C No results for input(s): HGBA1C in the last 72 hours. Fasting Lipid Panel No results for input(s): CHOL, HDL, LDLCALC, TRIG, CHOLHDL, LDLDIRECT in the last 72 hours. Thyroid Function Tests No results for input(s): TSH, T4TOTAL, T3FREE, THYROIDAB in the last 72 hours.  Invalid input(s): FREET3 _____________  CT CORONARY MORPH W/CTA COR W/SCORE W/CA W/CM &/OR WO/CM  Addendum Date: 05/10/2020   ADDENDUM REPORT: 05/10/2020 21:00 CLINICAL DATA:  69 year old with typical angina. Medical history includes non-obstructive coronary artery disease, hypertension and hyperlipidemia. EXAM: Cardiac/Coronary  CT TECHNIQUE: The patient was scanned  on a [employer]. FINDINGS: A 120 kV prospective scan was triggered in the descending thoracic aorta at 111 HU's. Axial non-contrast 3 mm slices were carried out through the heart. The data set was analyzed on a dedicated work station and scored using the [place]. Gantry rotation speed was 250 msecs and collimation was .6 mm. No beta blockade and 0.8 mg of sl NTG was given. The 3D data set was reconstructed in 5% intervals of the 67-82 % of the R-R cycle. Diastolic phases were analyzed on a dedicated work station using MPR, MIP and VRT modes. The patient received 80 cc of contrast. Aorta: Normal size. Mild aortic root and descending aorta calcifications. No dissection. Aortic Valve:  Trileaflet.  There is mild calcifications. Coronary Arteries:  Normal coronary origin.  Co-dominance. RCA is a medium co-dominant artery that gives rise to PDA and PLVB. There is severe (>70) calcified plaque in the proximal RCA. The mid portion with moderate (50-69%) calcified plaque. Left main is a large artery that gives rise to LAD and LCX arteries. There is a moderate (50-69%) calcified plaque. LAD is a large vessel. There is a segmental lesion in the proximal portion of the LAD- the first is short moderate (50-69%) calcified lesion and the second is the long severe (>70%) tubular lesion. The mid portion of the LAD with mild (25-49%) calcified plaque. There another mid LAD plaque which is severe (>70%). The mid-distal LAD with moderate (50-69%) calcified plaque. D1  with a mild proximal calcified plaque. LCX is a co-dominant artery that gives rise to one large OM1 branch. There is mild luminal irregularities. Other findings: Normal pulmonary vein drainage into the left atrium. Normal left atrial appendage without a thrombus. Normal size of the pulmonary artery. IMPRESSION: 1. Coronary calcium score of 704. This was 63 percentile for age and sex matched control. 2. Severe two-vessel coronary artery disease. CADRADS  4A. Recommend Cardiac Catheterization. 3.  Normal coronary origin with Co-dominance. 4.  Aortic Atherosclerosis Berniece Salines, DO Electronically Signed   By: Berniece Salines MD   On: 05/10/2020 21:00   Result Date: 05/10/2020 EXAM: OVER-READ INTERPRETATION  CT CHEST The following report is an over-read performed by radiologist Dr. Vinnie Langton of [facility], [place] on 05/10/2020. This over-read does not include interpretation of cardiac or coronary anatomy or pathology. The coronary calcium score/coronary CTA interpretation by the cardiologist is attached. COMPARISON:  None. FINDINGS: Aortic atherosclerosis. Within the visualized portions of the thorax there are no suspicious appearing pulmonary nodules or masses, there is no acute consolidative airspace disease, no pleural effusions, no pneumothorax and no lymphadenopathy. Visualized portions of the upper abdomen are unremarkable. There are no aggressive appearing lytic or blastic lesions noted in the visualized portions of the skeleton. IMPRESSION: 1.  Aortic Atherosclerosis (ICD10-I70.0). Electronically Signed: By: Vinnie Langton M.D. On: 05/10/2020 15:23    Disposition   Pt is being discharged home today in good condition.  Follow-up Plans & Appointments    Follow-up Information    Theora Gianotti, NP Follow up on 06/07/2020.   Specialties: Nurse Practitioner, Cardiology, Radiology Why: at 2:20pm for your follow up appt. Contact information: [address] [place] [address] [phone]          Discharge Instructions    AMB Referral to Cardiac Rehabilitation - Phase II   Complete by: As directed    Diagnosis: Coronary Stents   After initial evaluation and assessments completed: Virtual Based Care may be provided alone or in conjunction with Phase 2 Cardiac Rehab based on patient barriers.: Yes       Discharge Medications   Allergies as of 06/01/2020      Reactions   Boniva [ibandronic Acid]     Fosamax [alendronate Sodium] Other (See Comments)   GERD and joint pain   Prolia [denosumab] Other (See Comments)   Joint pain and in bed for 3 days      Medication List    STOP taking these medications   furosemide 20 MG tablet Commonly known as: LASIX   traMADol 50 MG tablet Commonly known as: ULTRAM   traZODone 150 MG tablet Commonly known as: DESYREL     TAKE these medications   aspirin EC 81 MG tablet Take 1 tablet (81 mg total) by mouth daily.   atorvastatin 80 MG tablet Commonly known as: LIPITOR Take 1 tablet (80 mg total) by mouth daily.   benazepril-hydrochlorthiazide 20-12.5 MG tablet Commonly known as: LOTENSIN HCT Take 1 tablet by mouth daily.   budesonide-formoterol 80-4.5 MCG/ACT inhaler Commonly known as: Symbicort Inhale 2 puffs into the lungs 2 (two) times daily. What changed:   when to take this  reasons to take this   butalbital-acetaminophen-caffeine 50-325-40-30 MG capsule Commonly known as: FIORICET WITH CODEINE TAKE 1 TO 2 CAPSULES BY MOUTH DAILY AS NEEDED FOR HEADACHE What changed:   how much to take  how to take this  when to take this  reasons to take  this   cholecalciferol 1000 units tablet Commonly known as: VITAMIN D Take 1,000 Units by mouth daily.   clopidogrel 75 MG tablet Commonly known as: Plavix Take 1 tablet (75 mg total) by mouth daily.   diazepam 5 MG tablet Commonly known as: VALIUM Take 1 tablet (5 mg total) by mouth every 6 (six) hours as needed. for anxiety   diltiazem 180 MG 24 hr capsule Commonly known as: CARDIZEM CD TAKE 1 CAPSULE BY MOUTH EVERY DAY What changed:   how much to take  how to take this  when to take this   ESTROVEN COMPLETE PO Take 1 tablet by mouth daily.   fluticasone 50 MCG/ACT nasal spray Commonly known as: FLONASE SPRAY 2 SPRAYS INTO EACH NOSTRIL EVERY DAY What changed: See the new instructions.   nitroGLYCERIN 0.4 MG SL tablet Commonly known as: Nitrostat Place 1  tablet (0.4 mg total) under the tongue every 5 (five) minutes as needed for chest pain. Maximum of 3 doses.   ProAir HFA 108 (90 Base) MCG/ACT inhaler Generic drug: albuterol INHALE 1 TO 2 PUFFS INTO THE LUNGS EVERY 6 HOURS AS NEEDED FOR WHEEZING OR SHORTNESS OF BREATH What changed: See the new instructions.   ranolazine 500 MG 12 hr tablet Commonly known as: RANEXA TAKE 1 TABLET(500 MG) BY MOUTH TWICE DAILY What changed: See the new instructions.   sertraline 100 MG tablet Commonly known as: ZOLOFT Take 1qd (Plz sched visit with new provider for future fills) What changed:   how much to take  how to take this  when to take this   valACYclovir 1000 MG tablet Commonly known as: VALTREX TAKE 1 TABLET BY MOUTH TWICE DAILY What changed:   how much to take  when to take this  reasons to take this          Allergies Allergies  Allergen Reactions  . Boniva [Ibandronic Acid]   . Fosamax [Alendronate Sodium] Other (See Comments)    GERD and joint pain  . Prolia [Denosumab] Other (See Comments)    Joint pain and in bed for 3 days    Outstanding Labs/Studies   N/a  Duration of Discharge Encounter   Greater than 30 minutes including physician time.  Signed, Reino Bellis, NP 06/01/2020, 11:15 AM

## 2020-06-01 NOTE — Interval H&P Note (Signed)
History and Physical Interval Note:  06/01/2020 7:03 AM  Diane [name]  has presented today for surgery, with the diagnosis of accelerating angina and abnormal cardiac CTA.  The various methods of treatment have been discussed with the patient and family. After consideration of risks, benefits and other options for treatment, the patient has consented to  Procedure(s): LEFT HEART CATH AND CORONARY ANGIOGRAPHY (N/A) as a surgical intervention.  The patient's history has been reviewed, patient examined, no change in status, stable for surgery.  I have reviewed the patient's chart and labs.  Questions were answered to the patient's satisfaction.    Cath Lab Visit (complete for each Cath Lab visit)  Clinical Evaluation Leading to the Procedure:   ACS: No.  Non-ACS:    Anginal Classification: CCS IV  Anti-ischemic medical therapy: Maximal Therapy (2 or more classes of medications)  Non-Invasive Test Results: High-risk stress test findings: cardiac mortality >3%/year  Prior CABG: No previous CABG  Baruch Lewers

## 2020-06-01 NOTE — Progress Notes (Signed)
[place]  Provided education to patient s/p PCI/stent. Provided patient with stent card and encouraged to copy/carry. Reviewed activity restrictions, s/s to report to MD, precautions, and wound care. Provided handout on low Na and heart healthy diet while addressing risk factors for HTN/Chol. Discussed walking at home and given guidelines for activity. Stressed use of all medications, especially Asa/Plavix. Stressed f/u with Dr. Saunders Revel. Pt will be referred to the [facility]. Pt verbalized understanding of education.  Lesly Rubenstein MS, ACSM-EP-C, CCRP

## 2020-06-01 NOTE — Brief Op Note (Signed)
BRIEF CARDIAC CATHETERIZATION NOTE  06/01/2020  9:25 AM  PATIENT:  Diane [name]  69 y.o. female  PRE-OPERATIVE DIAGNOSIS:  Multivessal disease  POST-OPERATIVE DIAGNOSIS:  * No post-op diagnosis entered *  PROCEDURE:  Procedure(s): LEFT HEART CATH AND CORONARY ANGIOGRAPHY (N/A) INTRAVASCULAR PRESSURE WIRE/FFR STUDY (N/A) CORONARY STENT INTERVENTION (N/A)  SURGEON:  Surgeon(s) and Role:    * Malaia Buchta, Harrell Gave, MD - Primary  FINDINGS: 1. Severe single-vessel CAD with 80% proximal RCA stenosis that is hemodynamically significant by DFR (0.88). 2. Mild to moderate, non-obstructive CAD involving the LAD and LCx. 3. Hyperdynamic left ventricular systolic function with normal filling pressure. 4. Successful PCI to proximal RCA using Syergy 2.5 x 12 mm DES with 0% residual stenosis and TIMI-3 flow.  RECOMMENDATIONS: 1. DAPT with ASA and clopidogrel for at least 6 months. 2. Aggressive secondary prevention. 3. Anticipate same-day discharge if no post-cath complications.  Nelva Bush, MD [facility]

## 2020-06-01 NOTE — Discharge Instructions (Signed)
Radial Site Care  This sheet gives you information about how to care for yourself after your procedure. Your health care provider may also give you more specific instructions. If you have problems or questions, contact your health care provider. What can I expect after the procedure? After the procedure, it is common to have:  Bruising and tenderness at the catheter insertion area. Follow these instructions at home: Medicines  Take over-the-counter and prescription medicines only as told by your health care provider. Insertion site care  Follow instructions from your health care provider about how to take care of your insertion site. Make sure you: ? Wash your hands with soap and water before you change your bandage (dressing). If soap and water are not available, use hand sanitizer. ? Change your dressing as told by your health care provider. ? Leave stitches (sutures), skin glue, or adhesive strips in place. These skin closures may need to stay in place for 2 weeks or longer. If adhesive strip edges start to loosen and curl up, you may trim the loose edges. Do not remove adhesive strips completely unless your health care provider tells you to do that.  Check your insertion site every day for signs of infection. Check for: ? Redness, swelling, or pain. ? Fluid or blood. ? Pus or a bad smell. ? Warmth.  Do not take baths, swim, or use a hot tub until your health care provider approves.  You may shower 24-48 hours after the procedure, or as directed by your health care provider. ? Remove the dressing and gently wash the site with plain soap and water. ? Pat the area dry with a clean towel. ? Do not rub the site. That could cause bleeding.  Do not apply powder or lotion to the site. Activity   For 24 hours after the procedure, or as directed by your health care provider: ? Do not flex or bend the affected arm. ? Do not push or pull heavy objects with the affected arm. ? Do not  drive yourself home from the hospital or clinic. You may drive 24 hours after the procedure unless your health care provider tells you not to. ? Do not operate machinery or power tools.  Do not lift anything that is heavier than 10 lb (4.5 kg), or the limit that you are told, until your health care provider says that it is safe.  Ask your health care provider when it is okay to: ? Return to work or school. ? Resume usual physical activities or sports. ? Resume sexual activity. General instructions  If the catheter site starts to bleed, raise your arm and put firm pressure on the site. If the bleeding does not stop, get help right away. This is a medical emergency.  If you went home on the same day as your procedure, a responsible adult should be with you for the first 24 hours after you arrive home.  Keep all follow-up visits as told by your health care provider. This is important. Contact a health care provider if:  You have a fever.  You have redness, swelling, or yellow drainage around your insertion site. Get help right away if:  You have unusual pain at the radial site.  The catheter insertion area swells very fast.  The insertion area is bleeding, and the bleeding does not stop when you hold steady pressure on the area.  Your arm or hand becomes pale, cool, tingly, or numb. These symptoms may represent a serious problem   that is an emergency. Do not wait to see if the symptoms will go away. Get medical help right away. Call your local emergency services (911 in the [place] Do not drive yourself to the hospital. Summary  After the procedure, it is common to have bruising and tenderness at the site.  Follow instructions from your health care provider about how to take care of your radial site wound. Check the wound every day for signs of infection.  Do not lift anything that is heavier than 10 lb (4.5 kg), or the limit that you are told, until your health care provider says  that it is safe. This information is not intended to replace advice given to you by your health care provider. Make sure you discuss any questions you have with your health care provider. Document Revised: 01/20/2018 Document Reviewed: 01/20/2018 Elsevier Patient Education  2020 [employer]vier Inc[employer]  

## 2020-06-06 ENCOUNTER — Telehealth (HOSPITAL_COMMUNITY): Payer: Self-pay

## 2020-06-06 NOTE — Telephone Encounter (Signed)
Pt insurance is active and benefits verified through [employer] $10, DED 0/0 met, out of pocket $3,900/$242.73 met, co-insurance 0%. no pre-authorization required. Passport, 06/06/2020@9 :50am, REF# [id number]  Will contact patient to see if she is interested in the Cardiac Rehab Program. If interested, patient will need to complete follow up appt. Once completed, patient will be contacted for scheduling upon review by the RN Navigator.

## 2020-06-07 ENCOUNTER — Other Ambulatory Visit: Payer: Self-pay

## 2020-06-07 ENCOUNTER — Other Ambulatory Visit: Payer: Self-pay | Admitting: Family Medicine

## 2020-06-07 ENCOUNTER — Ambulatory Visit: Payer: Medicare HMO | Admitting: Nurse Practitioner

## 2020-06-07 ENCOUNTER — Encounter: Payer: Self-pay | Admitting: Nurse Practitioner

## 2020-06-07 VITALS — BP 140/90 | HR 65 | Ht 61.5 in | Wt 186.1 lb

## 2020-06-07 DIAGNOSIS — I2510 Atherosclerotic heart disease of native coronary artery without angina pectoris: Secondary | ICD-10-CM

## 2020-06-07 DIAGNOSIS — I10 Essential (primary) hypertension: Secondary | ICD-10-CM | POA: Diagnosis not present

## 2020-06-07 DIAGNOSIS — E785 Hyperlipidemia, unspecified: Secondary | ICD-10-CM | POA: Diagnosis not present

## 2020-06-07 MED ORDER — EZETIMIBE 10 MG PO TABS
10.0000 mg | ORAL_TABLET | Freq: Every day | ORAL | 3 refills | Status: DC
Start: 2020-06-07 — End: 2020-07-30

## 2020-06-07 MED ORDER — CLOPIDOGREL BISULFATE 75 MG PO TABS: 75.0000 mg | ORAL_TABLET | Freq: Every day | ORAL | 3 refills | Status: DC

## 2020-06-07 NOTE — Patient Instructions (Signed)
Medication Instructions:  - Your physician has recommended you make the following change in your medication:   1) START zetia 10 mg- take 1 tablet by mouth once daily  *If you need a refill on your cardiac medications before your next appointment, please call your pharmacy*   Lab Work: - none ordered  If you have labs (blood work) drawn today and your tests are completely normal, you will receive your results only by: [name] MyChart Message (if you have MyChart) OR . A paper copy in the mail If you have any lab test that is abnormal or we need to change your treatment, we will call you to review the results.   Testing/Procedures: - none ordered   Follow-Up: At [facility], you and your health needs are our priority.  As part of our continuing mission to provide you with exceptional heart care, we have created designated Provider Care Teams.  These Care Teams include your primary Cardiologist (physician) and Advanced Practice Providers (APPs -  Physician Assistants and Nurse Practitioners) who all work together to provide you with the care you need, when you need it.  We recommend signing up for the patient portal called "MyChart".  Sign up information is provided on this After Visit Summary.  MyChart is used to connect with patients for Virtual Visits (Telemedicine).  Patients are able to view lab/test results, encounter notes, upcoming appointments, etc.  Non-urgent messages can be sent to your provider as well.   To learn more about what you can do with MyChart, go to [email or web address].    Your next appointment:   6 week(s)  The format for your next appointment:   In Person  Provider:    You may see Nelva Bush, MD or one of the following Advanced Practice Providers on your designated Care Team:    Murray Hodgkins, NP    Other Instructions - You have been referred to : Cardiac Rehab at [facility] - you will be called directly by the Cardiac Rehab  Department to schedule an orientation - if you do not hear from Cardiac Rehab in the next 2 weeks, please call us and we will follow up on this with you

## 2020-06-07 NOTE — Progress Notes (Signed)
Office Visit    Patient Name: Diane [name] Date of Encounter: 06/07/2020  Primary Care Provider:  Shireen Quan, MD Primary Cardiologist:  Nelva Bush, MD  Chief Complaint    69 year old female with a history of CAD, hypertension, hyperlipidemia, diastolic dysfunction, stroke, and GERD, who presents for follow-up after recent RCA stenting.  Past Medical History    Past Medical History:  Diagnosis Date  . Arthritis   . CAD (coronary artery disease)    a. 09/2013 Cath: LM nl, LAD 30p, 57m, LCX nl, RCA 30p, EF 55-60%; b. 04/2017 MV: EF>65%, apical defect ->breast attenuation. Sm. area of isch cannot be excluded; c. 04/2020 Cor CTA: Ca2+ = 704 (96%). Sev RCA/LAD dzs; d. 05/2020 Cath/PCI: LM nl, LAD 69m, 29m/d, LCX 20p, RCA 80p (DFR 0.88-->2.5x12 Synergy XD DES).  . 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 Depression   . Diastolic dysfunction    a. 01/2013 Echo: EF 55-60%, no rwma, Gr1 DD, PASP 34mmHg; b. 04/2017 Echo: EF 65-70%, no rwma, Gr1 DD.  [name] GERD (gastroesophageal reflux disease)   . Hyperlipidemia   . Hypertension   . PONV (postoperative nausea and vomiting)   . Skin cancer of face 05/2015   basal cell  . Stroke [facility]) 2006   deaf in right ear   Past Surgical History:  Procedure Laterality Date  . BREAST BIOPSY Left 1984   abcess  . CARDIAC CATHETERIZATION    . CESAREAN SECTION    . CHOLECYSTECTOMY  1990  . CHONDROPLASTY Left 01/05/2015   Procedure: CHONDROPLASTY;  Surgeon: Yvette Rack., MD;  Location: [place];  Service: Orthopedics;  Laterality: Left;  . CORONARY STENT INTERVENTION N/A 06/01/2020   Procedure: CORONARY STENT INTERVENTION;  Surgeon: Nelva Bush, MD;  Location: [place] CV LAB;  Service: Cardiovascular;  Laterality: N/A;  . INTRAVASCULAR PRESSURE WIRE/FFR STUDY N/A 06/01/2020   Procedure: INTRAVASCULAR PRESSURE WIRE/FFR STUDY;  Surgeon: Nelva Bush, MD;  Location: [place] CV LAB;  Service: Cardiovascular;  Laterality: N/A;  . KNEE ARTHROSCOPY WITH MEDIAL MENISECTOMY Left 01/05/2015   Procedure: LEFT KNEE ARTHROSCOPY WITH MEDIAL AND LATERAL MENISECTOMY; DEBRIDEMENT LATERAL PATELLA FEMORAL ;  Surgeon: Yvette Rack., MD;  Location: [place];  Service: Orthopedics;  Laterality: Left;  . LEFT HEART CATH AND CORONARY ANGIOGRAPHY N/A 06/01/2020   Procedure: LEFT HEART CATH AND CORONARY ANGIOGRAPHY;  Surgeon: Nelva Bush, MD;  Location: [place] CV LAB;  Service: Cardiovascular;  Laterality: N/A;  . LEFT HEART CATHETERIZATION WITH CORONARY ANGIOGRAM N/A 10/28/2013   Procedure: LEFT HEART CATHETERIZATION WITH CORONARY ANGIOGRAM;  Surgeon: Larey Dresser, MD;  Location: [facility] C[facility];  Service: Cardiovascular;  Laterality: N/A;  . TOTAL KNEE ARTHROPLASTY Left 07/06/2015   Procedure: TOTAL KNEE ARTHROPLASTY;  Surgeon: Earlie Server, MD;  Location: [place];  Service: Orthopedics;  Laterality: Left;  . TUBAL LIGATION      Allergies  Allergies  Allergen Reactions  . Boniva [Ibandronic Acid]   . Fosamax [Alendronate Sodium] Other (See Comments)    GERD and joint pain  . Prolia [Denosumab] Other (See Comments)    Joint pain and in bed for 3 days    History of Present Illness    69 year old female with the above past medical history including coronary artery disease, hypertension, hyperlipidemia, diastolic dysfunction, stroke, and GERD.  She  previously underwent diagnostic catheterization October 2014 showing moderate, nonobstructive CAD.  More recently, stress testing was undertaken in May 2018 which showed shifting breast attenuation though a small area of ischemia was unable to be excluded.  EF was 65%.  She was seen in the office in February of this year with complaints of chest discomfort and chronic exertional dyspnea.  Coronary CT angiography was performed in May which showed significant two-vessel CAD  involving a large LAD and the codominant RCA.  She was seen back in the office and decision made to pursue diagnostic catheterization.  This was performed on June 4 and revealed an 80% stenosis in the proximal RCA with an abnormal diastolic hyperemia free ratio (DFR) of 0.88.  She otherwise had nonobstructive LAD and circumflex disease.  The RCA was successfully treated using a 2.5 x 12 mm Synergy XD drug-eluting stent.  She tolerated the procedure well was discharged home later that afternoon.  Since discharge, she has felt well without any recurrent chest pain or dyspnea.  She has been taking it somewhat easy but notes that when she walked from [place] medical mall over to our office today, but she felt much better than she did the last time she had to do that.  She checks her blood pressure regularly at home and says it typically runs in the 120s over 70s, and is surprised that is elevated today at 140/90.  She is interested in participating in cardiac rehabilitation.  She denies palpitations, PND, orthopnea, dizziness, syncope, edema, or early satiety.  Home Medications    Prior to Admission medications   Medication Sig Start Date End Date Taking? Authorizing Provider  aspirin EC 81 MG tablet Take 1 tablet (81 mg total) by mouth daily. 03/31/17  Yes End, Harrell Gave, MD  atorvastatin (LIPITOR) 80 MG tablet Take 1 tablet (80 mg total) by mouth daily. 05/21/20  Yes Theora Gianotti, NP  benazepril-hydrochlorthiazide (LOTENSIN HCT) 20-12.5 MG tablet Take 1 tablet by mouth daily. 07/08/19  Yes Lucille Passy, MD  budesonide-formoterol [facility]) 80-4.5 MCG/ACT inhaler Inhale 2 puffs into the lungs 2 (two) times daily. Patient taking differently: Inhale 2 puffs into the lungs 2 (two) times daily as needed (asthma).  07/14/17  Yes Mannam, Praveen, MD  butalbital-acetaminophen-caffeine (FIORICET WITH CODEINE) 50-325-40-30 MG capsule TAKE 1 TO 2 CAPSULES BY MOUTH DAILY AS NEEDED FOR HEADACHE Patient taking  differently: Take 1-2 capsules by mouth every 6 (six) hours as needed for headache or migraine. TAKE 1 TO 2 CAPSULES BY MOUTH DAILY AS NEEDED FOR HEADACHE 07/14/19  Yes Lucille Passy, MD  cholecalciferol (VITAMIN D) 1000 UNITS tablet Take 1,000 Units by mouth daily.   Yes [provider]  clopidogrel (PLAVIX) 75 MG tablet Take 1 tablet (75 mg total) by mouth daily. 06/01/20  Yes End, Harrell Gave, MD  diazepam (VALIUM) 5 MG tablet Take 1 tablet (5 mg total) by mouth every 6 (six) hours as needed. for anxiety 07/13/19  Yes Lucille Passy, MD  diltiazem (CARDIZEM CD) 180 MG 24 hr capsule TAKE 1 CAPSULE BY MOUTH EVERY DAY Patient taking differently: Take 180 mg by mouth daily. TAKE 1 CAPSULE BY MOUTH EVERY DAY 03/16/20  Yes End, Harrell Gave, MD  esomeprazole (NEXIUM) 20 MG capsule Take 20 mg by mouth daily at 12 noon.   Yes [provider]  fluticasone (FLONASE) 50 MCG/ACT nasal spray SPRAY 2 SPRAYS INTO EACH NOSTRIL EVERY DAY Patient taking differently: Place 1 spray into both nostrils daily as needed for  allergies.  09/12/19  Yes Lucille Passy, MD  nitroGLYCERIN (NITROSTAT) 0.4 MG SL tablet Place 1 tablet (0.4 mg total) under the tongue every 5 (five) minutes as needed for chest pain. Maximum of 3 doses. 02/10/20  Yes End, Harrell Gave, MD  PROAIR HFA 108 [phone] Base) MCG/ACT inhaler INHALE 1 TO 2 PUFFS INTO THE LUNGS EVERY 6 HOURS AS NEEDED FOR WHEEZING OR SHORTNESS OF BREATH Patient taking differently: Inhale 1-2 puffs into the lungs every 6 (six) hours as needed for wheezing or shortness of breath.  04/03/17  Yes Lucille Passy, MD  ranolazine (RANEXA) 500 MG 12 hr tablet TAKE 1 TABLET(500 MG) BY MOUTH TWICE DAILY Patient taking differently: Take 500 mg by mouth 2 (two) times daily. TAKE 1 TABLET(500 MG) BY MOUTH TWICE DAILY 04/12/20  Yes End, Harrell Gave, MD  Rhubarb (ESTROVEN COMPLETE PO) Take 1 tablet by mouth daily.   Yes [provider]  sertraline (ZOLOFT) 100 MG tablet TAKE 1 TABLET  BY MOUTH EVERY DAY (PLZ SCHED VISIT WITH NEW PROVIDER FOR FUTURE FILLS) 06/07/20  Yes Libby Maw, MD  valACYclovir (VALTREX) 1000 MG tablet TAKE 1 TABLET BY MOUTH TWICE DAILY Patient taking differently: Take 500 mg by mouth 2 (two) times daily as needed (fever blisters).  11/24/17  Yes Lucille Passy, MD    Review of Systems    Doing well post PCI. She denies chest pain, palpitations, dyspnea, pnd, orthopnea, n, v, dizziness, syncope, edema, weight gain, or early satiety.  All other systems reviewed and are otherwise negative except as noted above.  Physical Exam    VS:  BP 140/90 (BP Location: Left Arm, Patient Position: Sitting, Cuff Size: Normal)   Pulse 65   Ht 5' 1.5" (1.562 m)   Wt 186 lb 2 oz (84.4 kg)   SpO2 95%   BMI 34.60 kg/m  , BMI Body mass index is 34.6 kg/m. GEN: Well nourished, well developed, in no acute distress. HEENT: normal. Neck: Supple, no JVD, carotid bruits, or masses. Cardiac: RRR, 1 of 6 stock murmur at the upper sternal border, no rubs, or gallops. No clubbing, cyanosis, edema.  Radials/PT 2+ and equal bilaterally.  Right radial catheterization site without bleeding, bruit, or hematoma. Respiratory:  Respirations regular and unlabored, clear to auscultation bilaterally. GI: Soft, nontender, nondistended, BS + x 4. MS: no deformity or atrophy. Skin: warm and dry, no rash. Neuro:  Strength and sensation are intact. Psych: Normal affect.  Accessory Clinical Findings    ECG personally reviewed by me today -regular sinus rhythm, 67, leftward axis - no acute changes.  Lab Results  Component Value Date   WBC 5.0 05/18/2020   HGB 15.0 05/18/2020   HCT 44.2 05/18/2020   MCV 97 05/18/2020   PLT 184 05/18/2020   Lab Results  Component Value Date   CREATININE 0.79 05/18/2020   BUN 15 05/18/2020   NA 138 05/18/2020   K 3.9 05/18/2020   CL 96 05/18/2020   CO2 27 05/18/2020   Lab Results  Component Value Date   ALT 17 12/25/2019   AST 22  12/25/2019   ALKPHOS 66 12/25/2019   BILITOT 0.6 12/25/2019   Lab Results  Component Value Date   CHOL 177 05/18/2020   HDL 62 05/18/2020   LDLCALC 92 05/18/2020   LDLDIRECT 94 05/18/2020   TRIG 133 05/18/2020   CHOLHDL 2.9 05/18/2020    Lab Results  Component Value Date   HGBA1C 5.2 10/28/2013    Assessment &  Plan    1.  Coronary artery disease: Patient previously evaluated in February with complaints of exertional chest discomfort and subsequently underwent coronary CT angiography suggestive of severe RCA and LAD disease.  She underwent diagnostic catheterization last week revealing severe right coronary artery disease with abnormal DFR.  This was successfully treated with a drug-eluting stent.  She has felt well since discharge and has been ambulating some around her house without symptoms or limitations.  She is interested in cardiac rehabilitation and I will send in a referral to [facility], as she lives in the [place] area.  She remains on aspirin, statin, Plavix, and ranolazine therapy.  I will refill Plavix today as she only received 30 days with no refills at discharge.  She is remain on uninterrupted dual antiplatelet therapy for at least 6 months.  In the setting of lipids not being at goal with an LDL of 92 despite high potency statin therapy, she was agreeable to begin taking Zetia 10 mg daily.  2.  Essential hypertension: Blood pressure elevated in clinic today however, she checks this at home regularly and notes that she is typically in the 120s over 42s.  Of asked her to continue to follow blood pressure closely at home and contact us within the next few weeks if trends are consistently greater than 622 systolic, at which point we can consider titration of her benazepril-HCTZ.  3.  Hyperlipidemia: Lipids in May notable for an LDL of 94.  She is on atorvastatin 80 mg daily and has been on this for several years.  We discussed options for management and given the finding of  coronary disease requiring stenting, we agreed to be as aggressive as possible and therefore, I am adding Zetia 10 mg daily.  Plan follow-up lipids and LFTs at follow-up visit in 6 weeks.  4.  Disposition: Follow-up in approximately 6 weeks with plan for lipids and LFTs at that time.  Murray Hodgkins, NP 06/07/2020, 3:53 PM

## 2020-06-12 ENCOUNTER — Encounter (HOSPITAL_COMMUNITY): Payer: Self-pay

## 2020-06-12 ENCOUNTER — Telehealth (HOSPITAL_COMMUNITY): Payer: Self-pay

## 2020-06-12 NOTE — Telephone Encounter (Signed)
Pt returned CR phone call and stated she is interested in CR. Patient will come in for orientation on 07/12/20 @ 10AM and will attend the 1:45PM exercise class. Went over insurance, patient verbalized understanding.  Mailed letter.

## 2020-06-12 NOTE — Telephone Encounter (Signed)
Attempted to call patient in regards to Cardiac Rehab - LM on VM Mailed letter 

## 2020-06-25 ENCOUNTER — Other Ambulatory Visit: Payer: Self-pay | Admitting: Internal Medicine

## 2020-06-25 MED ORDER — CLOPIDOGREL BISULFATE 75 MG PO TABS: 75.0000 mg | ORAL_TABLET | Freq: Every day | ORAL | 0 refills | Status: DC

## 2020-06-25 MED ORDER — RANOLAZINE ER 500 MG PO TB12: 500.0000 mg | ORAL_TABLET | Freq: Two times a day (BID) | ORAL | 0 refills | Status: DC

## 2020-06-25 MED ORDER — DILTIAZEM HCL ER COATED BEADS 180 MG PO CP24: ORAL_CAPSULE | ORAL | 0 refills | Status: DC

## 2020-06-25 NOTE — Telephone Encounter (Signed)
Requested Prescriptions   Signed Prescriptions Disp Refills   diltiazem (CARDIZEM CD) 180 MG 24 hr capsule 90 capsule 0    Sig: TAKE 1 CAPSULE BY MOUTH EVERY DAY    Authorizing Provider: END, CHRISTOPHER    Ordering User: NEWCOMER MCCLAIN, Miana Politte L   ranolazine (RANEXA) 500 MG 12 hr tablet 180 tablet 0    Sig: Take 1 tablet (500 mg total) by mouth 2 (two) times daily.    Authorizing Provider: END, CHRISTOPHER    Ordering User: NEWCOMER MCCLAIN, Ambrosia Wisnewski L   clopidogrel (PLAVIX) 75 MG tablet 90 tablet 0    Sig: Take 1 tablet (75 mg total) by mouth daily.    Authorizing Provider: END, CHRISTOPHER    Ordering User: Raelene Bott, Michio Thier L

## 2020-06-25 NOTE — Telephone Encounter (Signed)
*  STAT* If patient is at the pharmacy, call can be transferred to refill team.   1. Which medications need to be refilled? (please list name of each medication and dose if known)   Ranolazine 500 mg po BID  Diltiazem 180 mg po q d  Clopidogrel 75 mg po q d     2. Which pharmacy/location (including street and city if local pharmacy) is medication to be sent to?  [facility] [address]   3. Do they need a 30 day or 90 day supply? [place]

## 2020-06-29 ENCOUNTER — Other Ambulatory Visit: Payer: Self-pay | Admitting: Family Medicine

## 2020-07-06 ENCOUNTER — Telehealth (HOSPITAL_COMMUNITY): Payer: Self-pay

## 2020-07-06 NOTE — Telephone Encounter (Cosign Needed)
Cardiac Rehab Medication Review by a Pharmacist  Does the patient  feel that his/her medications are working for him/her?  yes  Has the patient been experiencing any side effects to the medications prescribed?  no  Does the patient measure his/her own blood pressure or blood glucose at home?  yes - Patient says she checks her BP every 1-2 days and has been running in the 90-100s/60s lately.  Does the patient have any problems obtaining medications due to transportation or finances?   no  Understanding of regimen: excellent Understanding of indications: excellent Potential of compliance: excellent  Pharmacist comments: She has not needed to use her inhalers much in the last few months and feels her asthma has improved. She says her doctor recently increased her Nexium to 1 tab twice daily with some improvement.   Claudina Lick, PharmD PGY1 Acute Care Pharmacy Resident 07/06/2020 12:53 PM

## 2020-07-11 ENCOUNTER — Telehealth (HOSPITAL_COMMUNITY): Payer: Self-pay | Admitting: *Deleted

## 2020-07-12 ENCOUNTER — Encounter (HOSPITAL_COMMUNITY)
Admission: RE | Admit: 2020-07-12 | Discharge: 2020-07-12 | Disposition: A | Discharge status: Home / Self Care | Payer: Medicare HMO | Source: Ambulatory Visit | Attending: Internal Medicine | Admitting: Internal Medicine

## 2020-07-12 ENCOUNTER — Other Ambulatory Visit: Payer: Self-pay

## 2020-07-12 ENCOUNTER — Encounter (HOSPITAL_COMMUNITY): Payer: Self-pay

## 2020-07-12 VITALS — BP 122/78 | HR 77 | Ht 63.0 in | Wt 184.3 lb

## 2020-07-12 DIAGNOSIS — Z955 Presence of coronary angioplasty implant and graft: Secondary | ICD-10-CM

## 2020-07-12 NOTE — Progress Notes (Signed)
Cardiac Individual Treatment Plan  Patient Details  Name: Diane [name] MRN: [id number] Date of Birth: 1951-07-11 Referring Provider:     CARDIAC REHAB PHASE II ORIENTATION from 07/12/2020 in [place]  Referring Provider End, Harrell Gave MD      Initial Encounter Date:    CARDIAC REHAB PHASE II ORIENTATION from 07/12/2020 in [facility]  Date 07/12/20      Visit Diagnosis: S/P DES RCA 06/01/20  Patient's Home Medications on Admission:  Current Outpatient Medications:  .  aspirin EC 81 MG tablet, Take 1 tablet (81 mg total) by mouth daily., Disp: 90 tablet, Rfl: 3 .  atorvastatin (LIPITOR) 80 MG tablet, Take 1 tablet (80 mg total) by mouth daily., Disp: 90 tablet, Rfl: 0 .  benazepril-hydrochlorthiazide (LOTENSIN HCT) 20-12.5 MG tablet, Take 1 tablet by mouth daily., Disp: 90 tablet, Rfl: 2 .  budesonide-formoterol (SYMBICORT) 80-4.5 MCG/ACT inhaler, Inhale 2 puffs into the lungs 2 (two) times daily. (Patient taking differently: Inhale 2 puffs into the lungs 2 (two) times daily as needed (asthma). ), Disp: 1 Inhaler, Rfl: 12 .  cholecalciferol (VITAMIN D) 1000 UNITS tablet, Take 1,000 Units by mouth daily., Disp: , Rfl:  .  clopidogrel (PLAVIX) 75 MG tablet, Take 1 tablet (75 mg total) by mouth daily., Disp: 90 tablet, Rfl: 0 .  diazepam (VALIUM) 5 MG tablet, Take 1 tablet (5 mg total) by mouth every 6 (six) hours as needed. for anxiety, Disp: 90 tablet, Rfl: 2 .  diltiazem (CARDIZEM CD) 180 MG 24 hr capsule, TAKE 1 CAPSULE BY MOUTH EVERY DAY, Disp: 90 capsule, Rfl: 0 .  esomeprazole (NEXIUM) 20 MG capsule, Take 20 mg by mouth 2 (two) times daily before a meal. , Disp: , Rfl:  .  ezetimibe (ZETIA) 10 MG tablet, Take 1 tablet (10 mg total) by mouth daily., Disp: 90 tablet, Rfl: 3 .  fluticasone (FLONASE) 50 MCG/ACT nasal spray, SPRAY 2 SPRAYS INTO EACH NOSTRIL EVERY DAY (Patient taking differently: Place 1 spray into both  nostrils daily as needed for allergies. ), Disp: 16 mL, Rfl: 5 .  nitroGLYCERIN (NITROSTAT) 0.4 MG SL tablet, Place 1 tablet (0.4 mg total) under the tongue every 5 (five) minutes as needed for chest pain. Maximum of 3 doses., Disp: 25 tablet, Rfl: 1 .  PROAIR HFA 108 (90 Base) MCG/ACT inhaler, INHALE 1 TO 2 PUFFS INTO THE LUNGS EVERY 6 HOURS AS NEEDED FOR WHEEZING OR SHORTNESS OF BREATH (Patient taking differently: Inhale 1-2 puffs into the lungs every 6 (six) hours as needed for wheezing or shortness of breath. ), Disp: 8.5 g, Rfl: 1 .  ranolazine (RANEXA) 500 MG 12 hr tablet, Take 1 tablet (500 mg total) by mouth 2 (two) times daily., Disp: 180 tablet, Rfl: 0 .  Rhubarb (ESTROVEN COMPLETE PO), Take 1 tablet by mouth daily., Disp: , Rfl:  .  sertraline (ZOLOFT) 100 MG tablet, TAKE 1 TABLET BY MOUTH EVERY DAY (PLZ SCHED VISIT WITH NEW PROVIDER FOR FUTURE FILLS), Disp: 30 tablet, Rfl: 0 .  valACYclovir (VALTREX) 1000 MG tablet, TAKE 1 TABLET BY MOUTH TWICE DAILY (Patient taking differently: Take 500 mg by mouth 2 (two) times daily as needed (fever blisters). ), Disp: 10 tablet, Rfl: 0  Past Medical History: Past Medical History:  Diagnosis Date  . Arthritis   . CAD (coronary artery disease)    a. 09/2013 Cath: LM nl, LAD 30p, 54m, LCX nl, RCA 30p, EF 55-60%; b. 04/2017  MV: EF>65%, apical defect ->breast attenuation. Sm. area of isch cannot be excluded; c. 04/2020 Cor CTA: Ca2+ = 704 (96%). Sev RCA/LAD dzs; d. 05/2020 Cath/PCI: LM nl, LAD 61m, 15m/d, LCX 20p, RCA 80p (DFR 0.88-->2.5x12 Synergy XD DES).  . 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 Depression   . Diastolic dysfunction    a. 01/2013 Echo: EF 55-60%, no rwma, Gr1 DD, PASP 80mmHg; b. 04/2017 Echo: EF 65-70%, no rwma, Gr1 DD.  [name] GERD (gastroesophageal reflux disease)   . Hyperlipidemia   . Hypertension   . PONV (postoperative nausea and vomiting)   . Skin cancer  of face 05/2015   basal cell  . Stroke [facility]) 2006   deaf in right ear    Tobacco Use: Social History   Tobacco Use  Smoking Status Never Smoker  Smokeless Tobacco Never Used  Tobacco Comment   LIVES WITH 2 SMOKERS     Labs: Recent Review Flowsheet Data    Labs for ITP Cardiac and Pulmonary Rehab Latest Ref Rng & Units 11/24/2016 06/17/2017 11/11/2017 09/08/2018 05/18/2020   Cholestrol 100 - 199 mg/dL 290(H) 193 212(H) 164 177   LDLCALC 0 - 99 mg/dL 188(H) 91 112(H) 68 92   LDLDIRECT 0 - 99 mg/dL - - - - 94   HDL >39 mg/dL 73 78 70 65.30 62   Trlycerides 0 - 149 mg/dL 143 118 152(H) 154.0(H) 133   Hemoglobin A1c <5.7 % - - - - -   TCO2 0 - 100 mmol/L - - - - -      Capillary Blood Glucose: No results found for: GLUCAP   Exercise Target Goals: Exercise Program Goal: Individual exercise prescription set using results from initial 6 min walk test and THRR while considering  patient's activity barriers and safety.   Exercise Prescription Goal: Starting with aerobic activity 30 plus minutes a day, 3 days per week for initial exercise prescription. Provide home exercise prescription and guidelines that participant acknowledges understanding prior to discharge.  Activity Barriers & Risk Stratification:  Activity Barriers & Cardiac Risk Stratification - 07/12/20 1157      Activity Barriers & Cardiac Risk Stratification   Activity Barriers Left Knee Replacement;Joint Problems;Balance Concerns    Cardiac Risk Stratification Moderate           6 Minute Walk:  6 Minute Walk    Row Name 07/12/20 1137         6 Minute Walk   Phase Initial     Distance 1254 feet     Walk Time 6 minutes     # of Rest Breaks 0     MPH 2.4     METS 2.8     RPE 12     Perceived Dyspnea  0     VO2 Peak 9.7     Symptoms Yes (comment)     Comments 5/10 Bilateral Hip Pain     Resting HR 77 bpm     Resting BP 122/78     Resting Oxygen Saturation  95 %     Exercise Oxygen Saturation  during  6 min walk 94 %     Max Ex. HR 98 bpm     Max Ex. BP 150/82     2 Minute Post BP 130/80            Oxygen Initial Assessment:  Oxygen Re-Evaluation:   Oxygen Discharge (Final Oxygen Re-Evaluation):   Initial Exercise Prescription:  Initial Exercise Prescription - 07/12/20 1100      Date of Initial Exercise RX and Referring Provider   Date 07/12/20    Referring Provider End, Harrell Gave MD    Expected Discharge Date 09/07/20      NuStep   Level 2    SPM 75    Minutes 15    METs 2.3      Arm Ergometer   Level 2    Watts 20    Minutes 15    METs 2.3      Prescription Details   Frequency (times per week) 3x    Duration Progress to 30 minutes of continuous aerobic without signs/symptoms of physical distress      Intensity   THRR 40-80% of Max Heartrate 61-122    Ratings of Perceived Exertion 11-13    Perceived Dyspnea 0-4      Progression   Progression Continue progressive overload as per policy without signs/symptoms or physical distress.      Resistance Training   Training Prescription Yes    Weight 3lbs    Reps 10-15           Perform Capillary Blood Glucose checks as needed.  Exercise Prescription Changes:   Exercise Comments:   Exercise Goals and Review:   Exercise Goals    Row Name 07/12/20 1141             Exercise Goals   Increase Physical Activity Yes       Intervention Provide advice, education, support and counseling about physical activity/exercise needs.;Develop an individualized exercise prescription for aerobic and resistive training based on initial evaluation findings, risk stratification, comorbidities and participant's personal goals.       Expected Outcomes Short Term: Attend rehab on a regular basis to increase amount of physical activity.;Long Term: Add in home exercise to make exercise part of routine and to increase amount of physical activity.;Long Term: Exercising regularly at least 3-5 days a week.       Increase  Strength and Stamina Yes       Intervention Provide advice, education, support and counseling about physical activity/exercise needs.;Develop an individualized exercise prescription for aerobic and resistive training based on initial evaluation findings, risk stratification, comorbidities and participant's personal goals.       Expected Outcomes Short Term: Increase workloads from initial exercise prescription for resistance, speed, and METs.;Short Term: Perform resistance training exercises routinely during rehab and add in resistance training at home;Long Term: Improve cardiorespiratory fitness, muscular endurance and strength as measured by increased METs and functional capacity (6MWT)       Able to understand and use rate of perceived exertion (RPE) scale Yes       Intervention Provide education and explanation on how to use RPE scale       Expected Outcomes Short Term: Able to use RPE daily in rehab to express subjective intensity level;Long Term:  Able to use RPE to guide intensity level when exercising independently       Knowledge and understanding of Target Heart Rate Range (THRR) Yes       Intervention Provide education and explanation of THRR including how the numbers were predicted and where they are located for reference       Expected Outcomes Short Term: Able to state/look up THRR;Long Term: Able to use THRR to govern intensity when exercising independently;Short Term: Able to use daily as  guideline for intensity in rehab       Able to check pulse independently Yes       Intervention Provide education and demonstration on how to check pulse in carotid and radial arteries.;Review the importance of being able to check your own pulse for safety during independent exercise       Expected Outcomes Short Term: Able to explain why pulse checking is important during independent exercise;Long Term: Able to check pulse independently and accurately       Understanding of Exercise Prescription Yes        Intervention Provide education, explanation, and written materials on patient's individual exercise prescription       Expected Outcomes Short Term: Able to explain program exercise prescription;Long Term: Able to explain home exercise prescription to exercise independently              Exercise Goals Re-Evaluation :    Discharge Exercise Prescription (Final Exercise Prescription Changes):   Nutrition:  Target Goals: Understanding of nutrition guidelines, daily intake of sodium 1500mg , cholesterol 200mg , calories 30% from fat and 7% or less from saturated fats, daily to have 5 or more servings of fruits and vegetables.  Biometrics:  Pre Biometrics - 07/12/20 1141      Pre Biometrics   Height 5\' 3"  (1.6 m)    Weight 83.6 kg    Waist Circumference 43.5 inches    Hip Circumference 46 inches    Waist to Hip Ratio 0.95 %    BMI (Calculated) 32.66    Triceps Skinfold 35 mm    % Body Fat 46 %    Grip Strength 26 kg    Flexibility 15 in    Single Leg Stand 6.63 seconds            Nutrition Therapy Plan and Nutrition Goals:   Nutrition Assessments:   Nutrition Goals Re-Evaluation:   Nutrition Goals Discharge (Final Nutrition Goals Re-Evaluation):   Psychosocial: Target Goals: Acknowledge presence or absence of significant depression and/or stress, maximize coping skills, provide positive support system. Participant is able to verbalize types and ability to use techniques and skills needed for reducing stress and depression.  Initial Review & Psychosocial Screening:  Initial Psych Review & Screening - 07/12/20 1225      Initial Review   Current issues with History of Depression;Current Psychotropic Meds      Family Dynamics   Good Support System? Yes   Ann lives alone. Lelon Frohlich has her children for support   Comments Ann's son has colon cancer.This has been a stressor for Lelon Frohlich and her family      Barriers   Psychosocial barriers to participate in program The  patient should benefit from training in stress management and relaxation.      Screening Interventions   Interventions Encouraged to exercise;Provide feedback about the scores to participant;To provide support and resources with identified psychosocial needs    Expected Outcomes Short Term goal: Identification and review with participant of any Quality of Life or Depression concerns found by scoring the questionnaire.;Long Term Goal: Stressors or current issues are controlled or eliminated.;Short Term goal: Utilizing psychosocial counselor, staff and physician to assist with identification of specific Stressors or current issues interfering with healing process. Setting desired goal for each stressor or current issue identified.           Quality of Life Scores:  Quality of Life - 07/12/20 1143      Quality of Life   Select Quality of  Life      Quality of Life Scores   Health/Function Pre 16.37 %    Socioeconomic Pre 22.4 %    Psych/Spiritual Pre 24 %    Family Pre 21.5 %    GLOBAL Pre 19.78 %          Scores of 19 and below usually indicate a poorer quality of life in these areas.  A difference of  2-3 points is a clinically meaningful difference.  A difference of 2-3 points in the total score of the Quality of Life Index has been associated with significant improvement in overall quality of life, self-image, physical symptoms, and general health in studies assessing change in quality of life.  PHQ-9: Recent Review Flowsheet Data    Depression screen [facility] 2/9 07/12/2020 10/13/2019 10/12/2019 04/25/2019 09/29/2018   Decreased Interest 0 0 0 0 0   Down, Depressed, Hopeless 0 0 0 1 0   PHQ - 2 Score 0 0 0 1 0   Altered sleeping - 3 - 3 -   Tired, decreased energy - 1 - 2 -   Change in appetite - 0 - 1 -   Feeling bad or failure about yourself  - 0 - 2 -   Trouble concentrating - 1 - 2 -   Moving slowly or fidgety/restless - 1 - 3 -   Suicidal thoughts - 0 - 0 -   PHQ-9 Score - 6 -  14 -   Difficult doing work/chores - Somewhat difficult - - -     Interpretation of Total Score  Total Score Depression Severity:  1-4 = Minimal depression, 5-9 = Mild depression, 10-14 = Moderate depression, 15-19 = Moderately severe depression, 20-27 = Severe depression   Psychosocial Evaluation and Intervention:   Psychosocial Re-Evaluation:   Psychosocial Discharge (Final Psychosocial Re-Evaluation):   Vocational Rehabilitation: Provide vocational rehab assistance to qualifying candidates.   Vocational Rehab Evaluation & Intervention:  Vocational Rehab - 07/12/20 1229      Initial Vocational Rehab Evaluation & Intervention   Assessment shows need for Vocational Rehabilitation No   Lelon Frohlich is retired and does not need vocational rehab at this time          Education: Education Goals: Education classes will be provided on a weekly basis, covering required topics. Participant will state understanding/return demonstration of topics presented.  Learning Barriers/Preferences:  Learning Barriers/Preferences - 07/12/20 1146      Learning Barriers/Preferences   Learning Barriers Sight    Learning Preferences Written Material;Skilled Demonstration;Verbal Instruction           Education Topics: Hypertension, Hypertension Reduction -Define heart disease and high blood pressure. Discus how high blood pressure affects the body and ways to reduce high blood pressure.   Exercise and Your Heart -Discuss why it is important to exercise, the FITT principles of exercise, normal and abnormal responses to exercise, and how to exercise safely.   Angina -Discuss definition of angina, causes of angina, treatment of angina, and how to decrease risk of having angina.   Cardiac Medications -Review what the following cardiac medications are used for, how they affect the body, and side effects that may occur when taking the medications.  Medications include Aspirin, Beta blockers, calcium  channel blockers, ACE Inhibitors, angiotensin receptor blockers, diuretics, digoxin, and antihyperlipidemics.   Congestive Heart Failure -Discuss the definition of CHF, how to live with CHF, the signs and symptoms of CHF, and how keep track of weight and sodium intake.  Heart Disease and Intimacy -Discus the effect sexual activity has on the heart, how changes occur during intimacy as we age, and safety during sexual activity.   Smoking Cessation / COPD -Discuss different methods to quit smoking, the health benefits of quitting smoking, and the definition of COPD.   Nutrition I: Fats -Discuss the types of cholesterol, what cholesterol does to the heart, and how cholesterol levels can be controlled.   Nutrition II: Labels -Discuss the different components of food labels and how to read food label   Heart Parts/Heart Disease and PAD -Discuss the anatomy of the heart, the pathway of blood circulation through the heart, and these are affected by heart disease.   Stress I: Signs and Symptoms -Discuss the causes of stress, how stress may lead to anxiety and depression, and ways to limit stress.   Stress II: Relaxation -Discuss different types of relaxation techniques to limit stress.   Warning Signs of Stroke / TIA -Discuss definition of a stroke, what the signs and symptoms are of a stroke, and how to identify when someone is having stroke.   Knowledge Questionnaire Score:  Knowledge Questionnaire Score - 07/12/20 1143      Knowledge Questionnaire Score   Pre Score 19/24           Core Components/Risk Factors/Patient Goals at Admission:  Personal Goals and Risk Factors at Admission - 07/12/20 1229      Core Components/Risk Factors/Patient Goals on Admission    Weight Management Yes;Weight Loss    Intervention Weight Management: Develop a combined nutrition and exercise program designed to reach desired caloric intake, while maintaining appropriate intake of nutrient  and fiber, sodium and fats, and appropriate energy expenditure required for the weight goal.;Weight Management: Provide education and appropriate resources to help participant work on and attain dietary goals.;Weight Management/Obesity: Establish reasonable short term and long term weight goals.;Obesity: Provide education and appropriate resources to help participant work on and attain dietary goals.    Admit Weight 184 lb 4.9 oz (83.6 kg)    Goal Weight: Long Term 174 lb (78.9 kg)    Expected Outcomes Short Term: Continue to assess and modify interventions until short term weight is achieved;Long Term: Adherence to nutrition and physical activity/exercise program aimed toward attainment of established weight goal;Weight Loss: Understanding of general recommendations for a balanced deficit meal plan, which promotes 1-2 lb weight loss per week and includes a negative energy balance of [phone] kcal/d;Understanding recommendations for meals to include 15-35% energy as protein, 25-35% energy from fat, 35-60% energy from carbohydrates, less than 200mg  of dietary cholesterol, 20-35 gm of total fiber daily;Understanding of distribution of calorie intake throughout the day with the consumption of 4-5 meals/snacks    Hypertension Yes    Intervention Provide education on lifestyle modifcations including regular physical activity/exercise, weight management, moderate sodium restriction and increased consumption of fresh fruit, vegetables, and low fat dairy, alcohol moderation, and smoking cessation.;Monitor prescription use compliance.    Expected Outcomes Short Term: Continued assessment and intervention until BP is < 140/4mm HG in hypertensive participants. < 130/15mm HG in hypertensive participants with diabetes, heart failure or chronic kidney disease.;Long Term: Maintenance of blood pressure at goal levels.    Lipids Yes    Intervention Provide education and support for participant on nutrition &  aerobic/resistive exercise along with prescribed medications to achieve LDL 70mg , HDL >40mg .    Expected Outcomes Short Term: Participant states understanding of desired cholesterol values and is compliant with medications prescribed. Participant  is following exercise prescription and nutrition guidelines.;Long Term: Cholesterol controlled with medications as prescribed, with individualized exercise RX and with personalized nutrition plan. Value goals: LDL < 70mg , HDL > 40 mg.    Stress Yes    Intervention Offer individual and/or small group education and counseling on adjustment to heart disease, stress management and health-related lifestyle change. Teach and support self-help strategies.;Refer participants experiencing significant psychosocial distress to appropriate mental health specialists for further evaluation and treatment. When possible, include family members and significant others in education/counseling sessions.    Expected Outcomes Short Term: Participant demonstrates changes in health-related behavior, relaxation and other stress management skills, ability to obtain effective social support, and compliance with psychotropic medications if prescribed.;Long Term: Emotional wellbeing is indicated by absence of clinically significant psychosocial distress or social isolation.           Core Components/Risk Factors/Patient Goals Review:    Core Components/Risk Factors/Patient Goals at Discharge (Final Review):    ITP Comments:  ITP Comments    Row Name 07/12/20 1137           ITP Comments Dr. Fransico Him, Medical Director              Comments: Lelon Frohlich attended orientation on 07/12/2020 to review rules and guidelines for program.  Completed 6 minute walk test, Intitial ITP, and exercise prescription.  VSS. Telemetry-Sinus Rhythm. Ann did report experiencing bilateral hip pain 5/10  Immediately after the walk test otherwise asymptomatic  Safety measures and social distancing in  place per CDC guidelines.Barnet Pall, RN,BSN 07/12/2020 12:37 PM

## 2020-07-13 ENCOUNTER — Encounter: Payer: Self-pay | Admitting: Family

## 2020-07-13 ENCOUNTER — Ambulatory Visit (INDEPENDENT_AMBULATORY_CARE_PROVIDER_SITE_OTHER): Payer: Medicare HMO | Admitting: Family

## 2020-07-13 VITALS — BP 122/72 | HR 72 | Ht 62.0 in | Wt 183.0 lb

## 2020-07-13 DIAGNOSIS — I25118 Atherosclerotic heart disease of native coronary artery with other forms of angina pectoris: Secondary | ICD-10-CM

## 2020-07-13 DIAGNOSIS — E785 Hyperlipidemia, unspecified: Secondary | ICD-10-CM

## 2020-07-13 DIAGNOSIS — I10 Essential (primary) hypertension: Secondary | ICD-10-CM | POA: Diagnosis not present

## 2020-07-13 NOTE — Patient Instructions (Addendum)
Medication Instructions:  No medication changes today.   *If you need a refill on your cardiac medications before your next appointment, please call your pharmacy*  Lab Work: Your physician recommends lab work today: lipid panel, direct LDL, CMP  If you have labs (blood work) drawn today and your tests are completely normal, you will receive your results only by: [name] MyChart Message (if you have MyChart) OR . A paper copy in the mail If you have any lab test that is abnormal or we need to change your treatment, we will call you to review the results.   Testing/Procedures: None ordered today.   Follow-Up: At [facility], you and your health needs are our priority.  As part of our continuing mission to provide you with exceptional heart care, we have created designated Provider Care Teams.  These Care Teams include your primary Cardiologist (physician) and Advanced Practice Providers (APPs -  Physician Assistants and Nurse Practitioners) who all work together to provide you with the care you need, when you need it.  We recommend signing up for the patient portal called "MyChart".  Sign up information is provided on this After Visit Summary.  MyChart is used to connect with patients for Virtual Visits (Telemedicine).  Patients are able to view lab/test results, encounter notes, upcoming appointments, etc.  Non-urgent messages can be sent to your provider as well.   To learn more about what you can do with MyChart, go to [email or web address].    Your next appointment:   3 month(s)  The format for your next appointment:   In Person  Provider:   You may see Nelva Bush, MD or one of the following Advanced Practice Providers on your designated Care Team:    Murray Hodgkins, NP  Christell Faith, PA-C  Marrianne Mood, PA-C  Other Instructions  Keep up the good work with cardiac rehab!  Fat and Cholesterol Restricted Eating Plan Getting too much fat and cholesterol in your diet  may cause health problems. Choosing the right foods helps keep your fat and cholesterol at normal levels. This can keep you from getting certain diseases.  What are tips for following this plan? Meal planning  At meals, divide your plate into four equal parts: ? Fill one-half of your plate with vegetables and green salads. ? Fill one-fourth of your plate with whole grains. ? Fill one-fourth of your plate with low-fat (lean) protein foods.  Eat fish that is high in omega-3 fats at least two times a week. This includes mackerel, tuna, sardines, and salmon.  Eat foods that are high in fiber, such as whole grains, beans, apples, broccoli, carrots, peas, and barley. General tips   Work with your doctor to lose weight if you need to.  Avoid: ? Foods with added sugar. ? Fried foods. ? Foods with partially hydrogenated oils.  Limit alcohol intake to no more than 1 drink a day for nonpregnant women and 2 drinks a day for men. One drink equals 12 oz of beer, 5 oz of wine, or 1 oz of hard liquor. Reading food labels  Check food labels for: ? Trans fats. ? Partially hydrogenated oils. ? Saturated fat (g) in each serving. ? Cholesterol (mg) in each serving. ? Fiber (g) in each serving.  Choose foods with healthy fats, such as: ? Monounsaturated fats. ? Polyunsaturated fats. ? Omega-3 fats.  Choose grain products that have whole grains. Look for the word "whole" as the first word in the ingredient list. Cooking  Cook foods using low-fat methods. These include baking, boiling, grilling, and broiling.  Eat more home-cooked foods. Eat at restaurants and buffets less often.  Avoid cooking using saturated fats, such as butter, cream, palm oil, palm kernel oil, and coconut oil. Recommended foods  Fruits  All fresh, canned (in natural juice), or frozen fruits. Vegetables  Fresh or frozen vegetables (raw, steamed, roasted, or grilled). Green salads. Grains  Whole grains, such as  whole wheat or whole grain breads, crackers, cereals, and pasta. Unsweetened oatmeal, bulgur, barley, quinoa, or brown rice. Corn or whole wheat flour tortillas. Meats and other protein foods  Ground beef (85% or leaner), grass-fed beef, or beef trimmed of fat. Skinless chicken or [place]. Ground chicken or [place]. Pork trimmed of fat. All fish and seafood. Egg whites. Dried beans, peas, or lentils. Unsalted nuts or seeds. Unsalted canned beans. Nut butters without added sugar or oil. Dairy  Low-fat or nonfat dairy products, such as skim or 1% milk, 2% or reduced-fat cheeses, low-fat and fat-free ricotta or cottage cheese, or plain low-fat and nonfat yogurt. Fats and oils  Tub margarine without trans fats. Light or reduced-fat mayonnaise and salad dressings. Avocado. Olive, canola, sesame, or safflower oils. The items listed above may not be a complete list of foods and beverages you can eat. Contact a dietitian for more information. Foods to avoid Fruits  Canned fruit in heavy syrup. Fruit in cream or butter sauce. Fried fruit. Vegetables  Vegetables cooked in cheese, cream, or butter sauce. Fried vegetables. Grains  White bread. White pasta. White rice. Cornbread. Bagels, pastries, and croissants. Crackers and snack foods that contain trans fat and hydrogenated oils. Meats and other protein foods  Fatty cuts of meat. Ribs, chicken wings, bacon, sausage, bologna, salami, chitterlings, fatback, hot dogs, bratwurst, and packaged lunch meats. Liver and organ meats. Whole eggs and egg yolks. Chicken and [place] with skin. Fried meat. Dairy  Whole or 2% milk, cream, half-and-half, and cream cheese. Whole milk cheeses. Whole-fat or sweetened yogurt. Full-fat cheeses. Nondairy creamers and whipped toppings. Processed cheese, cheese spreads, and cheese curds. Beverages  Alcohol. Sugar-sweetened drinks such as sodas, lemonade, and fruit drinks. Fats and oils  Butter, stick margarine, lard,  shortening, ghee, or bacon fat. Coconut, palm kernel, and palm oils. Sweets and desserts  Corn syrup, sugars, honey, and molasses. Candy. Jam and jelly. Syrup. Sweetened cereals. Cookies, pies, cakes, donuts, muffins, and ice cream. The items listed above may not be a complete list of foods and beverages you should avoid. Contact a dietitian for more information. Summary  Choosing the right foods helps keep your fat and cholesterol at normal levels. This can keep you from getting certain diseases.  At meals, fill one-half of your plate with vegetables and green salads.  Eat high-fiber foods, like whole grains, beans, apples, carrots, peas, and barley.  Limit added sugar, saturated fats, alcohol, and fried foods. This information is not intended to replace advice given to you by your health care provider. Make sure you discuss any questions you have with your health care provider. Document Revised: 08/18/2018 Document Reviewed: 09/01/2017 Elsevier Patient Education  [place].

## 2020-07-13 NOTE — Progress Notes (Signed)
Office Visit    Patient Name: Diane [name] Date of Encounter: 07/13/2020  Primary Care Provider:  Shireen Quan, MD Primary Cardiologist:  Nelva Bush, MD Electrophysiologist:  None   Chief Complaint    Diane [name] is a 69 y.o. female with a hx of CAD, HTN, HLD, diastolic dysfunction, CVA, GERD presents today for follow up after addition of Zetia.    Past Medical History    Past Medical History:  Diagnosis Date  . Arthritis   . CAD (coronary artery disease)    a. 09/2013 Cath: LM nl, LAD 30p, 21m, LCX nl, RCA 30p, EF 55-60%; b. 04/2017 MV: EF>65%, apical defect ->breast attenuation. Sm. area of isch cannot be excluded; c. 04/2020 Cor CTA: Ca2+ = 704 (96%). Sev RCA/LAD dzs; d. 05/2020 Cath/PCI: LM nl, LAD 73m, 68m/d, LCX 20p, RCA 80p (DFR 0.88-->2.5x12 Synergy XD DES).  . 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 Depression   . Diastolic dysfunction    a. 01/2013 Echo: EF 55-60%, no rwma, Gr1 DD, PASP 67mmHg; b. 04/2017 Echo: EF 65-70%, no rwma, Gr1 DD.  [name] GERD (gastroesophageal reflux disease)   . Hyperlipidemia   . Hypertension   . PONV (postoperative nausea and vomiting)   . Skin cancer of face 05/2015   basal cell  . Stroke [facility]) 2006   deaf in right ear   Past Surgical History:  Procedure Laterality Date  . BREAST BIOPSY Left 1984   abcess  . CARDIAC CATHETERIZATION    . CESAREAN SECTION    . CHOLECYSTECTOMY  1990  . CHONDROPLASTY Left 01/05/2015   Procedure: CHONDROPLASTY;  Surgeon: Yvette Rack., MD;  Location: [place];  Service: Orthopedics;  Laterality: Left;  . CORONARY STENT INTERVENTION N/A 06/01/2020   Procedure: CORONARY STENT INTERVENTION;  Surgeon: Nelva Bush, MD;  Location: [place] CV [facility];  Service: Cardiovascular;  Laterality: N/A;  . INTRAVASCULAR PRESSURE WIRE/FFR STUDY N/A 06/01/2020   Procedure: INTRAVASCULAR PRESSURE WIRE/FFR STUDY;   Surgeon: Nelva Bush, MD;  Location: [place] [facility];  Service: Cardiovascular;  Laterality: N/A;  . KNEE ARTHROSCOPY WITH MEDIAL MENISECTOMY Left 01/05/2015   Procedure: LEFT KNEE ARTHROSCOPY WITH MEDIAL AND LATERAL MENISECTOMY; DEBRIDEMENT LATERAL PATELLA FEMORAL ;  Surgeon: Yvette Rack., MD;  Location: [place];  Service: Orthopedics;  Laterality: Left;  . LEFT HEART CATH AND CORONARY ANGIOGRAPHY N/A 06/01/2020   Procedure: LEFT HEART CATH AND CORONARY ANGIOGRAPHY;  Surgeon: Nelva Bush, MD;  Location: [place] [facility];  Service: Cardiovascular;  Laterality: N/A;  . LEFT HEART CATHETERIZATION WITH CORONARY ANGIOGRAM N/A 10/28/2013   Procedure: LEFT HEART CATHETERIZATION WITH CORONARY ANGIOGRAM;  Surgeon: Larey Dresser, MD;  Location: [facility] C[facility];  Service: Cardiovascular;  Laterality: N/A;  . TOTAL KNEE ARTHROPLASTY Left 07/06/2015   Procedure: TOTAL KNEE ARTHROPLASTY;  Surgeon: Earlie Server, MD;  Location: [place];  Service: Orthopedics;  Laterality: Left;  . TUBAL LIGATION      Allergies  Allergies  Allergen Reactions  . Boniva [Ibandronic Acid]     Joint Pain and Stiffness   . Fosamax [Alendronate Sodium] Other (See Comments)    GERD and joint pain  . Prolia [Denosumab] Other (See Comments)    Joint pain and in bed for 3 days    History of Present Illness    Diane Mola  A [name] is a 69 y.o. female with a hx of CAD, HTN, HLD, diastolic dysfunction, CVA, GERD last seen 06/07/20 by Ignacia Bayley, NP.  Previous cardiac workup includes cardiac catheterization 09/2013 with moderate, nonobstructive CAD. Stress test May 2018 with breast attenuation though small area ischemia unable to excluded, EF 65%, Seen in clinic 01/2020 with chest discomfort and DOE. Coronary CTA 04/2020 with significant two-vessel CAD involving large LAD and codominant RCA. Diagnostic catheterization 06/01/2020 with 80% stenosis prox RCA with abnormal diastolic hyperemia free ratio (DFR) of  0.88. Otherwise nonobstructive disease of LAD and circumflex. RCA trested with DES. Seen in clinic 06/07/20 for follow up feeling overall well, noted mildly elevated BP though home readings were good, and started on Zetia 10mg  daily.  She reports feeling overall well.  She has tolerated Zetia without difficulty.  She did have 1 day of diarrhea but this is since resolved.  Reports no chest pain, pressure, tightness.  Tells me her breathing has markedly improved as has her anxiety post her stent.  She had orientation with cardiac rehab yesterday and is very excited about participating in the program.  EKGs/Labs/Other Studies Reviewed:   The following studies were reviewed today:  LHC 06/01/20 Conclusions: 1. Severe single-vessel coronary artery disease with 80% proximal RCA stenosis that is hemodynamically significant by DFR (0.88). 2. Mild to moderate, non-obstructive coronary artery disease involving the LAD and LCx. 3. Hyperdynamic left ventricular systolic function with normal filling pressure. 4. Successful PCI to proximal RCA using Syergy 2.5 x 12 mm drug-eluting stent with 0% residual stenosis and TIMI-3 flow.   Recommendations: 1. Dual antiplatelet therapy with aspirin and clopidogrel for at least 6 months. 2. Aggressive secondary prevention. 3. Anticipate same-day discharge if no post-cath complications.  EKG:  No EKG today. EKG independently reviewed 06/07/20 NSR 76 bpm with left axis and no acute ST/T wave changes.   Recent Labs: 12/25/2019: ALT 17 05/18/2020: BUN 15; Creatinine, Ser 0.79; Hemoglobin 15.0; Platelets 184; Potassium 3.9; Sodium 138  Recent Lipid Panel    Component Value Date/Time   CHOL 177 05/18/2020 1443   TRIG 133 05/18/2020 1443   HDL 62 05/18/2020 1443   CHOLHDL 2.9 05/18/2020 1443   CHOLHDL 3 09/08/2018 0811   VLDL 30.8 09/08/2018 0811   LDLCALC 92 05/18/2020 1443   LDLDIRECT 94 05/18/2020 1443    Home Medications   Current Meds  Medication Sig  .  aspirin EC 81 MG tablet Take 1 tablet (81 mg total) by mouth daily.  [name] atorvastatin (LIPITOR) 80 MG tablet Take 1 tablet (80 mg total) by mouth daily.  . benazepril-hydrochlorthiazide (LOTENSIN HCT) 20-12.5 MG tablet Take 1 tablet by mouth daily.  . budesonide-formoterol (SYMBICORT) 80-4.5 MCG/ACT inhaler Inhale 2 puffs into the lungs 2 (two) times daily. (Patient taking differently: Inhale 2 puffs into the lungs 2 (two) times daily as needed (asthma). )  . cholecalciferol (VITAMIN D) 1000 UNITS tablet Take 1,000 Units by mouth daily.  . clopidogrel (PLAVIX) 75 MG tablet Take 1 tablet (75 mg total) by mouth daily.  . diazepam (VALIUM) 5 MG tablet Take 1 tablet (5 mg total) by mouth every 6 (six) hours as needed. for anxiety  . diltiazem (CARDIZEM CD) 180 MG 24 hr capsule TAKE 1 CAPSULE BY MOUTH EVERY DAY  . esomeprazole (NEXIUM) 20 MG capsule Take 20 mg by mouth 2 (two) times daily before a meal.   . ezetimibe (ZETIA) 10 MG tablet Take 1 tablet (10 mg total) by mouth daily.  [name]  fluticasone (FLONASE) 50 MCG/ACT nasal spray SPRAY 2 SPRAYS INTO EACH NOSTRIL EVERY DAY (Patient taking differently: Place 1 spray into both nostrils daily as needed for allergies. )  . nitroGLYCERIN (NITROSTAT) 0.4 MG SL tablet Place 1 tablet (0.4 mg total) under the tongue every 5 (five) minutes as needed for chest pain. Maximum of 3 doses.  [name] PROAIR HFA 108 (90 Base) MCG/ACT inhaler INHALE 1 TO 2 PUFFS INTO THE LUNGS EVERY 6 HOURS AS NEEDED FOR WHEEZING OR SHORTNESS OF BREATH (Patient taking differently: Inhale 1-2 puffs into the lungs every 6 (six) hours as needed for wheezing or shortness of breath. )  . ranolazine (RANEXA) 500 MG 12 hr tablet Take 1 tablet (500 mg total) by mouth 2 (two) times daily.  . Rhubarb (ESTROVEN COMPLETE PO) Take 1 tablet by mouth daily.  . sertraline (ZOLOFT) 100 MG tablet TAKE 1 TABLET BY MOUTH EVERY DAY (PLZ SCHED VISIT WITH NEW PROVIDER FOR FUTURE FILLS)  . valACYclovir (VALTREX) 1000 MG  tablet TAKE 1 TABLET BY MOUTH TWICE DAILY (Patient taking differently: Take 500 mg by mouth 2 (two) times daily as needed (fever blisters). )    Review of Systems   Review of Systems  Constitutional: Negative for chills, fever and malaise/fatigue.  Cardiovascular: Negative for chest pain, dyspnea on exertion, irregular heartbeat, leg swelling, near-syncope, orthopnea, palpitations and syncope.  Respiratory: Negative for cough, shortness of breath and wheezing.   Gastrointestinal: Negative for melena, nausea and vomiting.  Genitourinary: Negative for hematuria.  Neurological: Negative for dizziness, light-headedness and weakness.   All other systems reviewed and are otherwise negative except as noted above.  Physical Exam    VS:  BP 122/72 (BP Location: Left Arm, Patient Position: Sitting, Cuff Size: Normal)   Pulse 72   Ht 5\' 2"  (1.575 m)   Wt 183 lb (83 kg)   SpO2 98%   BMI 33.47 kg/m  , BMI Body mass index is 33.47 kg/m. GEN: Well nourished, overweight, well developed, in no acute distress. HEENT: normal. Neck: Supple, no JVD, carotid bruits, or masses. Cardiac: RRR, no murmurs, rubs, or gallops. No clubbing, cyanosis, edema.  Radials/DP/PT 2+ and equal bilaterally.  Respiratory:  Respirations regular and unlabored, clear to auscultation bilaterally. GI: Soft, nontender, nondistended, BS + x 4. MS: No deformity or atrophy. Skin: Warm and dry, no rash. Neuro:  Strength and sensation are intact. Psych: Normal affect.  Assessment & Plan    1. CAD - S/p DES to RCA 06/01/20.  Recommended for DAPT for at least at least 6 months.  GDMT presently includes aspirin, Plavix, statin, Plavix, ranolazine, Zetia.  She reports no anginal symptoms.  She had her orientation with cardiac rehab yesterday and is very excited about participating in the program.  Tells me she has made changes to her diet to try to a more heart healthy diet has noted she has lost 6 pounds since her stent.  2. HTN - BP  well controlled. Continue current antihypertensive regimen.   3. HLD, LDL goal <70 - 05/14/20 LDL 93.  She was already on atorvastatin 80 mg daily, Zetia 10 mg daily was added. She had changed to a more lipid-lowering diet.  Repeat CMP, direct LDL, and lipid panel today.  Disposition: Follow up in 3 month(s) with Dr. Saunders Revel or APP.   Loel Dubonnet, NP 07/13/2020, 11:50 AM

## 2020-07-14 LAB — COMPREHENSIVE METABOLIC PANEL
ALT: 23 IU/L (ref 0–32)
AST: 18 IU/L (ref 0–40)
Albumin/Globulin Ratio: 1.8 (ref 1.2–2.2)
Albumin: 4.8 g/dL (ref 3.8–4.8)
Alkaline Phosphatase: 91 IU/L (ref 48–121)
BUN/Creatinine Ratio: 19 (ref 12–28)
BUN: 14 mg/dL (ref 8–27)
Bilirubin Total: 0.4 mg/dL (ref 0.0–1.2)
CO2: 24 mmol/L (ref 20–29)
Calcium: 9.8 mg/dL (ref 8.7–10.3)
Chloride: 99 mmol/L (ref 96–106)
Creatinine, Ser: 0.75 mg/dL (ref 0.57–1.00)
GFR calc Af Amer: 95 mL/min/{1.73_m2} (ref >59)
GFR calc non Af Amer: 83 mL/min/{1.73_m2} (ref >59)
Globulin, Total: 2.7 g/dL (ref 1.5–4.5)
Glucose: 98 mg/dL (ref 65–99)
Potassium: 4.3 mmol/L (ref 3.5–5.2)
Sodium: 140 mmol/L (ref 134–144)
Total Protein: 7.5 g/dL (ref 6.0–8.5)

## 2020-07-14 LAB — LIPID PANEL
Chol/HDL Ratio: 2.3 ratio (ref 0.0–4.4)
Cholesterol, Total: 166 mg/dL (ref 100–199)
HDL: 71 mg/dL (ref >39)
LDL Chol Calc (NIH): 70 mg/dL (ref 0–99)
Triglycerides: 151 mg/dL — ABNORMAL HIGH (ref 0–149)
VLDL Cholesterol Cal: 25 mg/dL (ref 5–40)

## 2020-07-14 LAB — LDL CHOLESTEROL, DIRECT: LDL Direct: 59 mg/dL (ref 0–99)

## 2020-07-16 ENCOUNTER — Other Ambulatory Visit: Payer: Self-pay

## 2020-07-16 ENCOUNTER — Encounter (HOSPITAL_COMMUNITY)
Admission: RE | Admit: 2020-07-16 | Discharge: 2020-07-16 | Disposition: A | Discharge status: Home / Self Care | Payer: Medicare HMO | Source: Ambulatory Visit | Attending: Internal Medicine | Admitting: Internal Medicine

## 2020-07-16 DIAGNOSIS — Z955 Presence of coronary angioplasty implant and graft: Secondary | ICD-10-CM

## 2020-07-16 NOTE — Progress Notes (Signed)
Cardiac Individual Treatment Plan  Patient Details  Name: Diane [name] MRN: [id number] Date of Birth: 06/02/1951 Referring Provider:     CARDIAC REHAB PHASE II ORIENTATION from 07/12/2020 in [place]  Referring Provider End, Harrell Gave MD      Initial Encounter Date:    CARDIAC REHAB PHASE II ORIENTATION from 07/12/2020 in [place]  Date 07/12/20      Visit Diagnosis: S/P DES RCA 06/01/20  Patient's Home Medications on Admission:  Current Outpatient Medications:  .  aspirin EC 81 MG tablet, Take 1 tablet (81 mg total) by mouth daily., Disp: 90 tablet, Rfl: 3 .  atorvastatin (LIPITOR) 80 MG tablet, Take 1 tablet (80 mg total) by mouth daily., Disp: 90 tablet, Rfl: 0 .  benazepril-hydrochlorthiazide (LOTENSIN HCT) 20-12.5 MG tablet, Take 1 tablet by mouth daily., Disp: 90 tablet, Rfl: 2 .  budesonide-formoterol (SYMBICORT) 80-4.5 MCG/ACT inhaler, Inhale 2 puffs into the lungs 2 (two) times daily. (Patient taking differently: Inhale 2 puffs into the lungs 2 (two) times daily as needed (asthma). ), Disp: 1 Inhaler, Rfl: 12 .  cholecalciferol (VITAMIN D) 1000 UNITS tablet, Take 1,000 Units by mouth daily., Disp: , Rfl:  .  clopidogrel (PLAVIX) 75 MG tablet, Take 1 tablet (75 mg total) by mouth daily., Disp: 90 tablet, Rfl: 0 .  diazepam (VALIUM) 5 MG tablet, Take 1 tablet (5 mg total) by mouth every 6 (six) hours as needed. for anxiety, Disp: 90 tablet, Rfl: 2 .  diltiazem (CARDIZEM CD) 180 MG 24 hr capsule, TAKE 1 CAPSULE BY MOUTH EVERY DAY, Disp: 90 capsule, Rfl: 0 .  esomeprazole (NEXIUM) 20 MG capsule, Take 20 mg by mouth 2 (two) times daily before a meal. , Disp: , Rfl:  .  ezetimibe (ZETIA) 10 MG tablet, Take 1 tablet (10 mg total) by mouth daily., Disp: 90 tablet, Rfl: 3 .  fluticasone (FLONASE) 50 MCG/ACT nasal spray, SPRAY 2 SPRAYS INTO EACH NOSTRIL EVERY DAY (Patient taking differently: Place 1 spray into both  nostrils daily as needed for allergies. ), Disp: 16 mL, Rfl: 5 .  nitroGLYCERIN (NITROSTAT) 0.4 MG SL tablet, Place 1 tablet (0.4 mg total) under the tongue every 5 (five) minutes as needed for chest pain. Maximum of 3 doses., Disp: 25 tablet, Rfl: 1 .  PROAIR HFA 108 (90 Base) MCG/ACT inhaler, INHALE 1 TO 2 PUFFS INTO THE LUNGS EVERY 6 HOURS AS NEEDED FOR WHEEZING OR SHORTNESS OF BREATH (Patient taking differently: Inhale 1-2 puffs into the lungs every 6 (six) hours as needed for wheezing or shortness of breath. ), Disp: 8.5 g, Rfl: 1 .  ranolazine (RANEXA) 500 MG 12 hr tablet, Take 1 tablet (500 mg total) by mouth 2 (two) times daily., Disp: 180 tablet, Rfl: 0 .  Rhubarb (ESTROVEN COMPLETE PO), Take 1 tablet by mouth daily., Disp: , Rfl:  .  sertraline (ZOLOFT) 100 MG tablet, TAKE 1 TABLET BY MOUTH EVERY DAY (PLZ SCHED VISIT WITH NEW PROVIDER FOR FUTURE FILLS), Disp: 30 tablet, Rfl: 0 .  valACYclovir (VALTREX) 1000 MG tablet, TAKE 1 TABLET BY MOUTH TWICE DAILY (Patient taking differently: Take 500 mg by mouth 2 (two) times daily as needed (fever blisters). ), Disp: 10 tablet, Rfl: 0  Past Medical History: Past Medical History:  Diagnosis Date  . Arthritis   . CAD (coronary artery disease)    a. 09/2013 Cath: LM nl, LAD 30p, 38m, LCX nl, RCA 30p, EF 55-60%; b. 04/2017  MV: EF>65%, apical defect ->breast attenuation. Sm. area of isch cannot be excluded; c. 04/2020 Cor CTA: Ca2+ = 704 (96%). Sev RCA/LAD dzs; d. 05/2020 Cath/PCI: LM nl, LAD 35m, 49m/d, LCX 20p, RCA 80p (DFR 0.88-->2.5x12 Synergy XD DES).  . 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 Depression   . Diastolic dysfunction    a. 01/2013 Echo: EF 55-60%, no rwma, Gr1 DD, PASP 81mmHg; b. 04/2017 Echo: EF 65-70%, no rwma, Gr1 DD.  [name] GERD (gastroesophageal reflux disease)   . Hyperlipidemia   . Hypertension   . PONV (postoperative nausea and vomiting)   . Skin cancer  of face 05/2015   basal cell  . Stroke [facility]) 2006   deaf in right ear    Tobacco Use: Social History   Tobacco Use  Smoking Status Never Smoker  Smokeless Tobacco Never Used  Tobacco Comment   LIVES WITH 2 SMOKERS     Labs: Recent Review Flowsheet Data    Labs for ITP Cardiac and Pulmonary Rehab Latest Ref Rng & Units 06/17/2017 11/11/2017 09/08/2018 05/18/2020 07/13/2020   Cholestrol 100 - 199 mg/dL 193 212(H) 164 177 166   LDLCALC 0 - 99 mg/dL 91 112(H) 68 92 70   LDLDIRECT 0 - 99 mg/dL - - - 94 59   HDL >39 mg/dL 78 70 65.30 62 71   Trlycerides 0 - 149 mg/dL 118 152(H) 154.0(H) 133 151(H)   Hemoglobin A1c <5.7 % - - - - -   TCO2 0 - 100 mmol/L - - - - -      Capillary Blood Glucose: No results found for: GLUCAP   Exercise Target Goals: Exercise Program Goal: Individual exercise prescription set using results from initial 6 min walk test and THRR while considering  patient's activity barriers and safety.   Exercise Prescription Goal: Initial exercise prescription builds to 30-45 minutes a day of aerobic activity, 2-3 days per week.  Home exercise guidelines will be given to patient during program as part of exercise prescription that the participant will acknowledge.  Activity Barriers & Risk Stratification:  Activity Barriers & Cardiac Risk Stratification - 07/12/20 1157      Activity Barriers & Cardiac Risk Stratification   Activity Barriers Left Knee Replacement;Joint Problems;Balance Concerns    Cardiac Risk Stratification Moderate           6 Minute Walk:  6 Minute Walk    Row Name 07/12/20 1137         6 Minute Walk   Phase Initial     Distance 1254 feet     Walk Time 6 minutes     # of Rest Breaks 0     MPH 2.4     METS 2.8     RPE 12     Perceived Dyspnea  0     VO2 Peak 9.7     Symptoms Yes (comment)     Comments 5/10 Bilateral Hip Pain     Resting HR 77 bpm     Resting BP 122/78     Resting Oxygen Saturation  95 %     Exercise Oxygen  Saturation  during 6 min walk 94 %     Max Ex. HR 98 bpm     Max Ex. BP 150/82     2 Minute Post BP 130/80  Oxygen Initial Assessment:   Oxygen Re-Evaluation:   Oxygen Discharge (Final Oxygen Re-Evaluation):   Initial Exercise Prescription:  Initial Exercise Prescription - 07/12/20 1100      Date of Initial Exercise RX and Referring Provider   Date 07/12/20    Referring Provider End, Harrell Gave MD    Expected Discharge Date 09/07/20      NuStep   Level 2    SPM 75    Minutes 15    METs 2.3      Arm Ergometer   Level 2    Watts 20    Minutes 15    METs 2.3      Prescription Details   Frequency (times per week) 3x    Duration Progress to 30 minutes of continuous aerobic without signs/symptoms of physical distress      Intensity   THRR 40-80% of Max Heartrate 61-122    Ratings of Perceived Exertion 11-13    Perceived Dyspnea 0-4      Progression   Progression Continue progressive overload as per policy without signs/symptoms or physical distress.      Resistance Training   Training Prescription Yes    Weight 3lbs    Reps 10-15           Perform Capillary Blood Glucose checks as needed.  Exercise Prescription Changes:   Exercise Prescription Changes    Row Name 07/16/20 1610             Response to Exercise   Blood Pressure (Admit) 100/54       Blood Pressure (Exercise) 130/80       Blood Pressure (Exit) 100/68       Heart Rate (Admit) 85 bpm       Heart Rate (Exercise) 89 bpm       Heart Rate (Exit) 72 bpm       Rating of Perceived Exertion (Exercise) 13       Symptoms None       Comments Pt's first day of exercise       Duration Progress to 30 minutes of  aerobic without signs/symptoms of physical distress       Intensity THRR unchanged         Resistance Training   Training Prescription Yes       Weight 3       Reps 10-15         Interval Training   Interval Training No         NuStep   Level 2       SPM 75        Minutes 15       METs 1.8         Arm Ergometer   Level 2       Watts 2       Minutes 15              Exercise Comments:   Exercise Goals and Review:   Exercise Goals    Row Name 07/12/20 1141             Exercise Goals   Increase Physical Activity Yes       Intervention Provide advice, education, support and counseling about physical activity/exercise needs.;Develop an individualized exercise prescription for aerobic and resistive training based on initial evaluation findings, risk stratification, comorbidities and participant's personal goals.       Expected Outcomes Short Term: Attend rehab on a regular basis to increase amount of  physical activity.;Long Term: Add in home exercise to make exercise part of routine and to increase amount of physical activity.;Long Term: Exercising regularly at least 3-5 days a week.       Increase Strength and Stamina Yes       Intervention Provide advice, education, support and counseling about physical activity/exercise needs.;Develop an individualized exercise prescription for aerobic and resistive training based on initial evaluation findings, risk stratification, comorbidities and participant's personal goals.       Expected Outcomes Short Term: Increase workloads from initial exercise prescription for resistance, speed, and METs.;Short Term: Perform resistance training exercises routinely during rehab and add in resistance training at home;Long Term: Improve cardiorespiratory fitness, muscular endurance and strength as measured by increased METs and functional capacity (6MWT)       Able to understand and use rate of perceived exertion (RPE) scale Yes       Intervention Provide education and explanation on how to use RPE scale       Expected Outcomes Short Term: Able to use RPE daily in rehab to express subjective intensity level;Long Term:  Able to use RPE to guide intensity level when exercising independently       Knowledge and  understanding of Target Heart Rate Range (THRR) Yes       Intervention Provide education and explanation of THRR including how the numbers were predicted and where they are located for reference       Expected Outcomes Short Term: Able to state/look up THRR;Long Term: Able to use THRR to govern intensity when exercising independently;Short Term: Able to use daily as guideline for intensity in rehab       Able to check pulse independently Yes       Intervention Provide education and demonstration on how to check pulse in carotid and radial arteries.;Review the importance of being able to check your own pulse for safety during independent exercise       Expected Outcomes Short Term: Able to explain why pulse checking is important during independent exercise;Long Term: Able to check pulse independently and accurately       Understanding of Exercise Prescription Yes       Intervention Provide education, explanation, and written materials on patient's individual exercise prescription       Expected Outcomes Short Term: Able to explain program exercise prescription;Long Term: Able to explain home exercise prescription to exercise independently              Exercise Goals Re-Evaluation :  Exercise Goals Re-Evaluation    Row Name 07/16/20 1610             Exercise Goal Re-Evaluation   Exercise Goals Review Increase Physical Activity;Increase Strength and Stamina;Able to understand and use rate of perceived exertion (RPE) scale;Able to check pulse independently;Understanding of Exercise Prescription;Knowledge and understanding of Target Heart Rate Range (THRR)       Comments Pt's first day of exercise in the [facility] program. Pt tolerated exercise prescription well and understnads the exercise prescription, RPE scale and THRR.       Expected Outcomes Will continue to monitor and progress patient as tolerated.              Discharge Exercise Prescription (Final Exercise Prescription Changes):   Exercise Prescription Changes - 07/16/20 1610      Response to Exercise   Blood Pressure (Admit) 100/54    Blood Pressure (Exercise) 130/80    Blood Pressure (Exit) 100/68    Heart Rate (  Admit) 85 bpm    Heart Rate (Exercise) 89 bpm    Heart Rate (Exit) 72 bpm    Rating of Perceived Exertion (Exercise) 13    Symptoms None    Comments Pt's first day of exercise    Duration Progress to 30 minutes of  aerobic without signs/symptoms of physical distress    Intensity THRR unchanged      Resistance Training   Training Prescription Yes    Weight 3    Reps 10-15      Interval Training   Interval Training No      NuStep   Level 2    SPM 75    Minutes 15    METs 1.8      Arm Ergometer   Level 2    Watts 2    Minutes 15           Nutrition:  Target Goals: Understanding of nutrition guidelines, daily intake of sodium 1500mg , cholesterol 200mg , calories 30% from fat and 7% or less from saturated fats, daily to have 5 or more servings of fruits and vegetables.  Biometrics:  Pre Biometrics - 07/12/20 1141      Pre Biometrics   Height 5\' 3"  (1.6 m)    Weight 83.6 kg    Waist Circumference 43.5 inches    Hip Circumference 46 inches    Waist to Hip Ratio 0.95 %    BMI (Calculated) 32.66    Triceps Skinfold 35 mm    % Body Fat 46 %    Grip Strength 26 kg    Flexibility 15 in    Single Leg Stand 6.63 seconds            Nutrition Therapy Plan and Nutrition Goals:   Nutrition Assessments:   Nutrition Goals Re-Evaluation:   Nutrition Goals Re-Evaluation:   Nutrition Goals Discharge (Final Nutrition Goals Re-Evaluation):   Psychosocial: Target Goals: Acknowledge presence or absence of significant depression and/or stress, maximize coping skills, provide positive support system. Participant is able to verbalize types and ability to use techniques and skills needed for reducing stress and depression.  Initial Review & Psychosocial Screening:  Initial Psych  Review & Screening - 07/12/20 1225      Initial Review   Current issues with History of Depression;Current Psychotropic Meds      Family Dynamics   Good Support System? Yes   Ann lives alone. Lelon Frohlich has her children for support   Comments Ann's son has colon cancer.This has been a stressor for Lelon Frohlich and her family      Barriers   Psychosocial barriers to participate in program The patient should benefit from training in stress management and relaxation.      Screening Interventions   Interventions Encouraged to exercise;Provide feedback about the scores to participant;To provide support and resources with identified psychosocial needs    Expected Outcomes Short Term goal: Identification and review with participant of any Quality of Life or Depression concerns found by scoring the questionnaire.;Long Term Goal: Stressors or current issues are controlled or eliminated.;Short Term goal: Utilizing psychosocial counselor, staff and physician to assist with identification of specific Stressors or current issues interfering with healing process. Setting desired goal for each stressor or current issue identified.           Quality of Life Scores:  Quality of Life - 07/12/20 1143      Quality of Life   Select Quality of Life      Quality of  Life Scores   Health/Function Pre 16.37 %    Socioeconomic Pre 22.4 %    Psych/Spiritual Pre 24 %    Family Pre 21.5 %    GLOBAL Pre 19.78 %          Scores of 19 and below usually indicate a poorer quality of life in these areas.  A difference of  2-3 points is a clinically meaningful difference.  A difference of 2-3 points in the total score of the Quality of Life Index has been associated with significant improvement in overall quality of life, self-image, physical symptoms, and general health in studies assessing change in quality of life.  PHQ-9: Recent Review Flowsheet Data    Depression screen [facility] 2/9 07/12/2020 10/13/2019 10/12/2019 04/25/2019  09/29/2018   Decreased Interest 0 0 0 0 0   Down, Depressed, Hopeless 0 0 0 1 0   PHQ - 2 Score 0 0 0 1 0   Altered sleeping - 3 - 3 -   Tired, decreased energy - 1 - 2 -   Change in appetite - 0 - 1 -   Feeling bad or failure about yourself  - 0 - 2 -   Trouble concentrating - 1 - 2 -   Moving slowly or fidgety/restless - 1 - 3 -   Suicidal thoughts - 0 - 0 -   PHQ-9 Score - 6 - 14 -   Difficult doing work/chores - Somewhat difficult - - -     Interpretation of Total Score  Total Score Depression Severity:  1-4 = Minimal depression, 5-9 = Mild depression, 10-14 = Moderate depression, 15-19 = Moderately severe depression, 20-27 = Severe depression   Psychosocial Evaluation and Intervention:  Psychosocial Evaluation - 07/16/20 1516      Psychosocial Evaluation & Interventions   Interventions Encouraged to exercise with the program and follow exercise prescription;Stress management education    Comments Ms. Vreeland presents to her first cardiac rehab exercise session with a positive outlook and attitude. She does admit to chronic depression but feels her symptoms are managed at this time. Her son has colon cancer and this is a stressor for her and her family. She has a strong support system and enjoys sewing, quilting, and reading as hobbies and stress management tools. She is a retired [employer] scientist.    Expected Outcomes Ms. Oatley will continue to maintain a positive attitude and outlook. She will monitor her depression symptoms and notify cardiac rehab RN if interventions are needed.    Continue Psychosocial Services  Follow up required by staff           Psychosocial Re-Evaluation:   Psychosocial Discharge (Final Psychosocial Re-Evaluation):   Vocational Rehabilitation: Provide vocational rehab assistance to qualifying candidates.   Vocational Rehab Evaluation & Intervention:  Vocational Rehab - 07/12/20 1229      Initial Vocational Rehab Evaluation & Intervention   Assessment  shows need for Vocational Rehabilitation No   Lelon Frohlich is retired and does not need vocational rehab at this time          Education: Education Goals: Education classes will be provided on a weekly basis, covering required topics. Participant will state understanding/return demonstration of topics presented.  Learning Barriers/Preferences:  Learning Barriers/Preferences - 07/12/20 1146      Learning Barriers/Preferences   Learning Barriers Sight    Learning Preferences Written Material;Skilled Demonstration;Verbal Instruction           Education Topics: Count Your Pulse:  -Group instruction provided  by verbal instruction, demonstration, patient participation and written materials to support subject.  Instructors address importance of being able to find your pulse and how to count your pulse when at home without a heart monitor.  Patients get hands on experience counting their pulse with staff help and individually.   Heart Attack, Angina, and Risk Factor Modification:  -Group instruction provided by verbal instruction, video, and written materials to support subject.  Instructors address signs and symptoms of angina and heart attacks.    Also discuss risk factors for heart disease and how to make changes to improve heart health risk factors.   Functional Fitness:  -Group instruction provided by verbal instruction, demonstration, patient participation, and written materials to support subject.  Instructors address safety measures for doing things around the house.  Discuss how to get up and down off the floor, how to pick things up properly, how to safely get out of a chair without assistance, and balance training.   Meditation and Mindfulness:  -Group instruction provided by verbal instruction, patient participation, and written materials to support subject.  Instructor addresses importance of mindfulness and meditation practice to help reduce stress and improve awareness.  Instructor  also leads participants through a meditation exercise.    Stretching for Flexibility and Mobility:  -Group instruction provided by verbal instruction, patient participation, and written materials to support subject.  Instructors lead participants through series of stretches that are designed to increase flexibility thus improving mobility.  These stretches are additional exercise for major muscle groups that are typically performed during regular warm up and cool down.   Hands Only CPR:  -Group verbal, video, and participation provides a basic overview of AHA guidelines for community CPR. Role-play of emergencies allow participants the opportunity to practice calling for help and chest compression technique with discussion of AED use.   Hypertension: -Group verbal and written instruction that provides a basic overview of hypertension including the most recent diagnostic guidelines, risk factor reduction with self-care instructions and medication management.    Nutrition I class: Heart Healthy Eating:  -Group instruction provided by PowerPoint slides, verbal discussion, and written materials to support subject matter. The instructor gives an explanation and review of the Therapeutic Lifestyle Changes diet recommendations, which includes a discussion on lipid goals, dietary fat, sodium, fiber, plant stanol/sterol esters, sugar, and the components of a well-balanced, healthy diet.   Nutrition II class: Lifestyle Skills:  -Group instruction provided by PowerPoint slides, verbal discussion, and written materials to support subject matter. The instructor gives an explanation and review of label reading, grocery shopping for heart health, heart healthy recipe modifications, and ways to make healthier choices when eating out.   Diabetes Question & Answer:  -Group instruction provided by PowerPoint slides, verbal discussion, and written materials to support subject matter. The instructor gives an  explanation and review of diabetes co-morbidities, pre- and post-prandial blood glucose goals, pre-exercise blood glucose goals, signs, symptoms, and treatment of hypoglycemia and hyperglycemia, and foot care basics.   Diabetes Blitz:  -Group instruction provided by PowerPoint slides, verbal discussion, and written materials to support subject matter. The instructor gives an explanation and review of the physiology behind type 1 and type 2 diabetes, diabetes medications and rational behind using different medications, pre- and post-prandial blood glucose recommendations and Hemoglobin A1c goals, diabetes diet, and exercise including blood glucose guidelines for exercising safely.    Portion Distortion:  -Group instruction provided by PowerPoint slides, verbal discussion, written materials, and food models to support  subject matter. The instructor gives an explanation of serving size versus portion size, changes in portions sizes over the last 20 years, and what consists of a serving from each food group.   Stress Management:  -Group instruction provided by verbal instruction, video, and written materials to support subject matter.  Instructors review role of stress in heart disease and how to cope with stress positively.     Exercising on Your Own:  -Group instruction provided by verbal instruction, power point, and written materials to support subject.  Instructors discuss benefits of exercise, components of exercise, frequency and intensity of exercise, and end points for exercise.  Also discuss use of nitroglycerin and activating EMS.  Review options of places to exercise outside of rehab.  Review guidelines for sex with heart disease.   Cardiac Drugs I:  -Group instruction provided by verbal instruction and written materials to support subject.  Instructor reviews cardiac drug classes: antiplatelets, anticoagulants, beta blockers, and statins.  Instructor discusses reasons, side effects, and  lifestyle considerations for each drug class.   Cardiac Drugs II:  -Group instruction provided by verbal instruction and written materials to support subject.  Instructor reviews cardiac drug classes: angiotensin converting enzyme inhibitors (ACE-I), angiotensin II receptor blockers (ARBs), nitrates, and calcium channel blockers.  Instructor discusses reasons, side effects, and lifestyle considerations for each drug class.   Anatomy and Physiology of the Circulatory System:  Group verbal and written instruction and models provide basic cardiac anatomy and physiology, with the coronary electrical and arterial systems. Review of: AMI, Angina, Valve disease, Heart Failure, Peripheral Artery Disease, Cardiac Arrhythmia, Pacemakers, and the ICD.   Other Education:  -Group or individual verbal, written, or video instructions that support the educational goals of the cardiac rehab program.   Holiday Eating Survival Tips:  -Group instruction provided by PowerPoint slides, verbal discussion, and written materials to support subject matter. The instructor gives patients tips, tricks, and techniques to help them not only survive but enjoy the holidays despite the onslaught of food that accompanies the holidays.   Knowledge Questionnaire Score:  Knowledge Questionnaire Score - 07/12/20 1143      Knowledge Questionnaire Score   Pre Score 19/24           Core Components/Risk Factors/Patient Goals at Admission:  Personal Goals and Risk Factors at Admission - 07/12/20 1229      Core Components/Risk Factors/Patient Goals on Admission    Weight Management Yes;Weight Loss    Intervention Weight Management: Develop a combined nutrition and exercise program designed to reach desired caloric intake, while maintaining appropriate intake of nutrient and fiber, sodium and fats, and appropriate energy expenditure required for the weight goal.;Weight Management: Provide education and appropriate resources to  help participant work on and attain dietary goals.;Weight Management/Obesity: Establish reasonable short term and long term weight goals.;Obesity: Provide education and appropriate resources to help participant work on and attain dietary goals.    Admit Weight 184 lb 4.9 oz (83.6 kg)    Goal Weight: Long Term 174 lb (78.9 kg)    Expected Outcomes Short Term: Continue to assess and modify interventions until short term weight is achieved;Long Term: Adherence to nutrition and physical activity/exercise program aimed toward attainment of established weight goal;Weight Loss: Understanding of general recommendations for a balanced deficit meal plan, which promotes 1-2 lb weight loss per week and includes a negative energy balance of [phone] kcal/d;Understanding recommendations for meals to include 15-35% energy as protein, 25-35% energy from fat, 35-60% energy from  carbohydrates, less than 200mg  of dietary cholesterol, 20-35 gm of total fiber daily;Understanding of distribution of calorie intake throughout the day with the consumption of 4-5 meals/snacks    Hypertension Yes    Intervention Provide education on lifestyle modifcations including regular physical activity/exercise, weight management, moderate sodium restriction and increased consumption of fresh fruit, vegetables, and low fat dairy, alcohol moderation, and smoking cessation.;Monitor prescription use compliance.    Expected Outcomes Short Term: Continued assessment and intervention until BP is < 140/27mm HG in hypertensive participants. < 130/34mm HG in hypertensive participants with diabetes, heart failure or chronic kidney disease.;Long Term: Maintenance of blood pressure at goal levels.    Lipids Yes    Intervention Provide education and support for participant on nutrition & aerobic/resistive exercise along with prescribed medications to achieve LDL 70mg , HDL >40mg .    Expected Outcomes Short Term: Participant states understanding of desired  cholesterol values and is compliant with medications prescribed. Participant is following exercise prescription and nutrition guidelines.;Long Term: Cholesterol controlled with medications as prescribed, with individualized exercise RX and with personalized nutrition plan. Value goals: LDL < 70mg , HDL > 40 mg.    Stress Yes    Intervention Offer individual and/or small group education and counseling on adjustment to heart disease, stress management and health-related lifestyle change. Teach and support self-help strategies.;Refer participants experiencing significant psychosocial distress to appropriate mental health specialists for further evaluation and treatment. When possible, include family members and significant others in education/counseling sessions.    Expected Outcomes Short Term: Participant demonstrates changes in health-related behavior, relaxation and other stress management skills, ability to obtain effective social support, and compliance with psychotropic medications if prescribed.;Long Term: Emotional wellbeing is indicated by absence of clinically significant psychosocial distress or social isolation.           Core Components/Risk Factors/Patient Goals Review:   Goals and Risk Factor Review    Row Name 07/16/20 1521             Core Components/Risk Factors/Patient Goals Review   Personal Goals Review Weight Management/Obesity;Lipids;Stress;Hypertension       Review Ms. Laton has multiple CAD risk factors. She is eager to participate in cardiac rehab for risk factor modifications including stress management. Her personal goals are to improve her gait and balance, to start swimming again for exercise and attend group exercise classes. Longterm she wishes to improve her diet. She will work with department RD.       Expected Outcomes Ms. Marrs will continue to participate in CR for risk factor reduction.              Core Components/Risk Factors/Patient Goals at Discharge  (Final Review):   Goals and Risk Factor Review - 07/16/20 1521      Core Components/Risk Factors/Patient Goals Review   Personal Goals Review Weight Management/Obesity;Lipids;Stress;Hypertension    Review Ms. Horace has multiple CAD risk factors. She is eager to participate in cardiac rehab for risk factor modifications including stress management. Her personal goals are to improve her gait and balance, to start swimming again for exercise and attend group exercise classes. Longterm she wishes to improve her diet. She will work with department RD.    Expected Outcomes Ms. Comunale will continue to participate in CR for risk factor reduction.           ITP Comments:  ITP Comments    Row Name 07/12/20 1137 07/16/20 1514         ITP Comments Dr. Fransico Him,  Medical Director 30 day ITP review: Ms. Dillenbeck completed her first cardiac rehab exercist session today and tolerated very well. She denied cardiac complaints. VSS. Worked to an RPE of 13. Stated "this feels so good to exercise. I feel like I am finally taking a step forward towards good health". Will continue to monitor             Comments: see ITP comments

## 2020-07-16 NOTE — Progress Notes (Signed)
Daily Session Note  Patient Details  Name: Diane [name] MRN: [id number] Date of Birth: Feb 01, 1951 Referring Provider:     [facility] from 07/12/2020 in [place]  Referring Provider End, Harrell Gave MD      Encounter Date: 07/16/2020  Check In:  Session Check In - 07/16/20 1408      Check-In   Supervising physician immediately available to respond to emergencies [facility] immediately available    Physician(s) Dr. Pietro Cassis    Location [facility]    Staff Present Dorma Russell, MS,ACSM CEP, Exercise Physiologist;Maria Whitaker, RN, Deland Pretty, MS, ACSM CEP, Exercise Physiologist;Terius Jacuinde Rollene Rotunda, RN, Isaac Laud, MS, ACSM-CEP, Exercise Physiologist;David Makemson, MS, EP-C, CCRP    Virtual Visit No    Medication changes reported     No    Fall or balance concerns reported    No    Tobacco Cessation No Change    Warm-up and Cool-down Performed on first and last piece of equipment    Resistance Training Performed Yes    VAD Patient? No    PAD/SET Patient? No      Pain Assessment   Currently in Pain? No/denies    Pain Score 0-No pain    Multiple Pain Sites No           Capillary Blood Glucose: No results found for this or any previous visit (from the past 24 hour(s)).    Social History   Tobacco Use  Smoking Status Never Smoker  Smokeless Tobacco Never Used  Tobacco Comment   LIVES WITH 2 SMOKERS     Goals Met:  Exercise tolerated well No report of cardiac concerns or symptoms Strength training completed today  Goals Unmet:  Not Applicable  Comments: Pt started cardiac rehab today.  Pt tolerated light exercise without difficulty. VSS, telemetry-NSR, asymptomatic.  Medication list reconciled. Pt denies barriers to medicaiton compliance.  PSYCHOSOCIAL ASSESSMENT:  PHQ-0. Pt exhibits positive coping skills, hopeful outlook with supportive family. No psychosocial needs  identified at this time, no psychosocial interventions necessary.  Pt oriented to exercise equipment and routine. Understanding verbalized.   Dr. Fransico Him is Medical Director for Cardiac Rehab at [facility].

## 2020-07-18 ENCOUNTER — Other Ambulatory Visit: Payer: Self-pay

## 2020-07-18 ENCOUNTER — Other Ambulatory Visit: Payer: Self-pay | Admitting: Family Medicine

## 2020-07-18 ENCOUNTER — Encounter (HOSPITAL_COMMUNITY)
Admission: RE | Admit: 2020-07-18 | Discharge: 2020-07-18 | Disposition: A | Discharge status: Home / Self Care | Payer: Medicare HMO | Source: Ambulatory Visit | Attending: Internal Medicine | Admitting: Internal Medicine

## 2020-07-18 DIAGNOSIS — Z955 Presence of coronary angioplasty implant and graft: Secondary | ICD-10-CM | POA: Diagnosis not present

## 2020-07-20 ENCOUNTER — Ambulatory Visit: Payer: Medicare HMO | Admitting: Family

## 2020-07-20 ENCOUNTER — Encounter (HOSPITAL_COMMUNITY)
Admission: RE | Admit: 2020-07-20 | Discharge: 2020-07-20 | Disposition: A | Discharge status: Home / Self Care | Payer: Medicare HMO | Source: Ambulatory Visit | Attending: Internal Medicine | Admitting: Internal Medicine

## 2020-07-20 ENCOUNTER — Other Ambulatory Visit: Payer: Self-pay

## 2020-07-20 DIAGNOSIS — Z955 Presence of coronary angioplasty implant and graft: Secondary | ICD-10-CM

## 2020-07-23 ENCOUNTER — Encounter (HOSPITAL_COMMUNITY): Payer: Medicare HMO

## 2020-07-23 ENCOUNTER — Telehealth (HOSPITAL_COMMUNITY): Payer: Self-pay | Admitting: Family Medicine

## 2020-07-25 ENCOUNTER — Encounter (HOSPITAL_COMMUNITY): Payer: Medicare HMO

## 2020-07-26 ENCOUNTER — Encounter: Payer: Self-pay | Admitting: Internal Medicine

## 2020-07-27 ENCOUNTER — Encounter (HOSPITAL_COMMUNITY): Payer: Medicare HMO

## 2020-07-30 ENCOUNTER — Encounter (HOSPITAL_COMMUNITY): Payer: Medicare HMO

## 2020-07-30 ENCOUNTER — Other Ambulatory Visit: Payer: Self-pay | Admitting: Internal Medicine

## 2020-07-30 NOTE — Telephone Encounter (Signed)
*  STAT* If patient is at the pharmacy, call can be transferred to refill team.   1. Which medications need to be refilled? (please list name of each medication and dose if known) Benazepril-hydrochlorthiazide 20-12.5 MG 1 tablet daily Clopidogrel 75 MG 1 tablet daily Ezetimibe 10 MG 1 tablet daily    2. Which pharmacy/location (including street and city if local pharmacy) is medication to be sent to? [facility] in [place]  3. Do they need a 30 day or 90 day supply? Patient is out of town for a funeral and forgot medications - would like to know if we can send in a weeks worth for her to use this week.  Please call if needed.

## 2020-07-31 ENCOUNTER — Other Ambulatory Visit: Payer: Self-pay | Admitting: Internal Medicine

## 2020-07-31 MED ORDER — BENAZEPRIL-HYDROCHLOROTHIAZIDE 20-12.5 MG PO TABS: 1.0000 | ORAL_TABLET | Freq: Every day | ORAL | 0 refills | Status: DC

## 2020-07-31 MED ORDER — EZETIMIBE 10 MG PO TABS: 10.0000 mg | ORAL_TABLET | Freq: Every day | ORAL | 0 refills | Status: DC

## 2020-07-31 MED ORDER — CLOPIDOGREL BISULFATE 75 MG PO TABS: 75.0000 mg | ORAL_TABLET | Freq: Every day | ORAL | 0 refills | Status: DC

## 2020-08-01 ENCOUNTER — Encounter (HOSPITAL_COMMUNITY): Payer: Medicare HMO

## 2020-08-01 ENCOUNTER — Telehealth (HOSPITAL_COMMUNITY): Payer: Self-pay | Admitting: Family Medicine

## 2020-08-03 ENCOUNTER — Encounter (HOSPITAL_COMMUNITY): Payer: Medicare HMO

## 2020-08-06 ENCOUNTER — Telehealth (HOSPITAL_COMMUNITY): Payer: Self-pay | Admitting: Family Medicine

## 2020-08-06 ENCOUNTER — Encounter (HOSPITAL_COMMUNITY): Payer: Medicare HMO

## 2020-08-07 ENCOUNTER — Encounter (HOSPITAL_COMMUNITY): Payer: Self-pay

## 2020-08-07 DIAGNOSIS — Z955 Presence of coronary angioplasty implant and graft: Secondary | ICD-10-CM

## 2020-08-07 NOTE — Progress Notes (Signed)
Cardiac Individual Treatment Plan  Patient Details  Name: Diane [name] MRN: [id number] Date of Birth: [birth year] Referring Provider:     CARDIAC REHAB PHASE II ORIENTATION from 07/12/2020 in [facility]  Referring Provider End, Harrell Gave MD      Initial Encounter Date:    CARDIAC REHAB PHASE II ORIENTATION from 07/12/2020 in [facility]  Date 07/12/20      Visit Diagnosis: S/P DES RCA 06/01/20  Patient's Home Medications on Admission:  Current Outpatient Medications:    aspirin EC 81 MG tablet, Take 1 tablet (81 mg total) by mouth daily., Disp: 90 tablet, Rfl: 3   atorvastatin (LIPITOR) 80 MG tablet, Take 1 tablet (80 mg total) by mouth daily., Disp: 90 tablet, Rfl: 0   benazepril-hydrochlorthiazide (LOTENSIN HCT) 20-12.5 MG tablet, Take 1 tablet by mouth daily., Disp: 7 tablet, Rfl: 0   budesonide-formoterol (SYMBICORT) 80-4.5 MCG/ACT inhaler, Inhale 2 puffs into the lungs 2 (two) times daily. (Patient taking differently: Inhale 2 puffs into the lungs 2 (two) times daily as needed (asthma). ), Disp: 1 Inhaler, Rfl: 12   cholecalciferol (VITAMIN D) 1000 UNITS tablet, Take 1,000 Units by mouth daily., Disp: , Rfl:    clopidogrel (PLAVIX) 75 MG tablet, TAKE 1 TABLET BY MOUTH EVERY DAY, Disp: 90 tablet, Rfl: 0   diazepam (VALIUM) 5 MG tablet, Take 1 tablet (5 mg total) by mouth every 6 (six) hours as needed. for anxiety, Disp: 90 tablet, Rfl: 2   diltiazem (CARDIZEM CD) 180 MG 24 hr capsule, TAKE 1 CAPSULE BY MOUTH EVERY DAY, Disp: 90 capsule, Rfl: 0   esomeprazole (NEXIUM) 20 MG capsule, Take 20 mg by mouth 2 (two) times daily before a meal. , Disp: , Rfl:    ezetimibe (ZETIA) 10 MG tablet, Take 1 tablet (10 mg total) by mouth daily., Disp: 7 tablet, Rfl: 0   fluticasone (FLONASE) 50 MCG/ACT nasal spray, SPRAY 2 SPRAYS INTO EACH NOSTRIL EVERY DAY (Patient taking differently: Place 1 spray into both nostrils daily  as needed for allergies. ), Disp: 16 mL, Rfl: 5   nitroGLYCERIN (NITROSTAT) 0.4 MG SL tablet, Place 1 tablet (0.4 mg total) under the tongue every 5 (five) minutes as needed for chest pain. Maximum of 3 doses., Disp: 25 tablet, Rfl: 1   PROAIR HFA 108 (90 Base) MCG/ACT inhaler, INHALE 1 TO 2 PUFFS INTO THE LUNGS EVERY 6 HOURS AS NEEDED FOR WHEEZING OR SHORTNESS OF BREATH (Patient taking differently: Inhale 1-2 puffs into the lungs every 6 (six) hours as needed for wheezing or shortness of breath. ), Disp: 8.5 g, Rfl: 1   ranolazine (RANEXA) 500 MG 12 hr tablet, Take 1 tablet (500 mg total) by mouth 2 (two) times daily., Disp: 180 tablet, Rfl: 0   Rhubarb (ESTROVEN COMPLETE PO), Take 1 tablet by mouth daily., Disp: , Rfl:    sertraline (ZOLOFT) 100 MG tablet, TAKE 1 TABLET BY MOUTH EVERY DAY (PLZ SCHED VISIT WITH NEW PROVIDER FOR FUTURE FILLS), Disp: 30 tablet, Rfl: 1   valACYclovir (VALTREX) 1000 MG tablet, TAKE 1 TABLET BY MOUTH TWICE DAILY (Patient taking differently: Take 500 mg by mouth 2 (two) times daily as needed (fever blisters). ), Disp: 10 tablet, Rfl: 0  Past Medical History: Past Medical History:  Diagnosis Date   Arthritis    CAD (coronary artery disease)    a. 09/2013 Cath: LM nl, LAD 30p, 65m, LCX nl, RCA 30p, EF 55-60%; b. 04/2017 MV: EF>65%,  apical defect ->breast attenuation. Sm. area of isch cannot be excluded; c. 04/2020 Cor CTA: Ca2+ = 704 (96%). Sev RCA/LAD dzs; d. 05/2020 Cath/PCI: LM nl, LAD 54m, 70m/d, LCX 20p, RCA 80p (DFR 0.88-->2.5x12 Synergy XD DES).   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 01/2013 Echo: EF 55-60%, no rwma, Gr1 DD, PASP 34mmHg; b. 04/2017 Echo: EF 65-70%, no rwma, Gr1 DD.   GERD (gastroesophageal reflux disease)    Hyperlipidemia    Hypertension    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/2015     basal cell   Stroke [facility]) 2006   deaf in right ear    Tobacco Use: Social History   Tobacco Use  Smoking Status Never Smoker  Smokeless Tobacco Never Used  Tobacco Comment   LIVES WITH 2 SMOKERS     Labs: Recent Review Flowsheet Data    Labs for ITP Cardiac and Pulmonary Rehab Latest Ref Rng & Units 06/17/2017 11/11/2017 09/08/2018 05/18/2020 07/13/2020   Cholestrol 100 - 199 mg/dL 193 212(H) 164 177 166   LDLCALC 0 - 99 mg/dL 91 112(H) 68 92 70   LDLDIRECT 0 - 99 mg/dL - - - 94 59   HDL >39 mg/dL 78 70 65.30 62 71   Trlycerides 0 - 149 mg/dL 118 152(H) 154.0(H) 133 151(H)   Hemoglobin A1c <5.7 % - - - - -   TCO2 0 - 100 mmol/L - - - - -      Capillary Blood Glucose: No results found for: GLUCAP   Exercise Target Goals: Exercise Program Goal: Individual exercise prescription set using results from initial 6 min walk test and THRR while considering  patients activity barriers and safety.   Exercise Prescription Goal: Initial exercise prescription builds to 30-45 minutes a day of aerobic activity, 2-3 days per week.  Home exercise guidelines will be given to patient during program as part of exercise prescription that the participant will acknowledge.  Activity Barriers & Risk Stratification:  Activity Barriers & Cardiac Risk Stratification - 07/12/20 1157      Activity Barriers & Cardiac Risk Stratification   Activity Barriers Left Knee Replacement;Joint Problems;Balance Concerns    Cardiac Risk Stratification Moderate           6 Minute Walk:  6 Minute Walk    Row Name 07/12/20 1137         6 Minute Walk   Phase Initial     Distance 1254 feet     Walk Time 6 minutes     # of Rest Breaks 0     MPH 2.4     METS 2.8     RPE 12     Perceived Dyspnea  0     VO2 Peak 9.7     Symptoms Yes (comment)     Comments 5/10 Bilateral Hip Pain     Resting HR 77 bpm     Resting BP 122/78     Resting Oxygen Saturation  95 %     Exercise Oxygen Saturation  during  6 min walk 94 %     Max Ex. HR 98 bpm     Max Ex. BP 150/82     2 Minute Post BP 130/80  Oxygen Initial Assessment:   Oxygen Re-Evaluation:   Oxygen Discharge (Final Oxygen Re-Evaluation):   Initial Exercise Prescription:  Initial Exercise Prescription - 07/12/20 1100      Date of Initial Exercise RX and Referring Provider   Date 07/12/20    Referring Provider End, Harrell Gave MD    Expected Discharge Date 09/07/20      NuStep   Level 2    SPM 75    Minutes 15    METs 2.3      Arm Ergometer   Level 2    Watts 20    Minutes 15    METs 2.3      Prescription Details   Frequency (times per week) 3x    Duration Progress to 30 minutes of continuous aerobic without signs/symptoms of physical distress      Intensity   THRR 40-80% of Max Heartrate 61-122    Ratings of Perceived Exertion 11-13    Perceived Dyspnea 0-4      Progression   Progression Continue progressive overload as per policy without signs/symptoms or physical distress.      Resistance Training   Training Prescription Yes    Weight 3lbs    Reps 10-15           Perform Capillary Blood Glucose checks as needed.  Exercise Prescription Changes:   Exercise Prescription Changes    Row Name 07/16/20 1610             Response to Exercise   Blood Pressure (Admit) 100/54       Blood Pressure (Exercise) 130/80       Blood Pressure (Exit) 100/68       Heart Rate (Admit) 85 bpm       Heart Rate (Exercise) 89 bpm       Heart Rate (Exit) 72 bpm       Rating of Perceived Exertion (Exercise) 13       Symptoms None       Comments Pt's first day of exercise       Duration Progress to 30 minutes of  aerobic without signs/symptoms of physical distress       Intensity THRR unchanged         Resistance Training   Training Prescription Yes       Weight 3       Reps 10-15         Interval Training   Interval Training No         NuStep   Level 2       SPM 75       Minutes 15        METs 1.8         Arm Ergometer   Level 2       Watts 2       Minutes 15              Exercise Comments:   Exercise Goals and Review:   Exercise Goals    Row Name 07/12/20 1141             Exercise Goals   Increase Physical Activity Yes       Intervention Provide advice, education, support and counseling about physical activity/exercise needs.;Develop an individualized exercise prescription for aerobic and resistive training based on initial evaluation findings, risk stratification, comorbidities and participant's personal goals.       Expected Outcomes Short Term: Attend rehab on a regular basis to increase amount of  physical activity.;Long Term: Add in home exercise to make exercise part of routine and to increase amount of physical activity.;Long Term: Exercising regularly at least 3-5 days a week.       Increase Strength and Stamina Yes       Intervention Provide advice, education, support and counseling about physical activity/exercise needs.;Develop an individualized exercise prescription for aerobic and resistive training based on initial evaluation findings, risk stratification, comorbidities and participant's personal goals.       Expected Outcomes Short Term: Increase workloads from initial exercise prescription for resistance, speed, and METs.;Short Term: Perform resistance training exercises routinely during rehab and add in resistance training at home;Long Term: Improve cardiorespiratory fitness, muscular endurance and strength as measured by increased METs and functional capacity (6MWT)       Able to understand and use rate of perceived exertion (RPE) scale Yes       Intervention Provide education and explanation on how to use RPE scale       Expected Outcomes Short Term: Able to use RPE daily in rehab to express subjective intensity level;Long Term:  Able to use RPE to guide intensity level when exercising independently       Knowledge and understanding of Target Heart  Rate Range (THRR) Yes       Intervention Provide education and explanation of THRR including how the numbers were predicted and where they are located for reference       Expected Outcomes Short Term: Able to state/look up THRR;Long Term: Able to use THRR to govern intensity when exercising independently;Short Term: Able to use daily as guideline for intensity in rehab       Able to check pulse independently Yes       Intervention Provide education and demonstration on how to check pulse in carotid and radial arteries.;Review the importance of being able to check your own pulse for safety during independent exercise       Expected Outcomes Short Term: Able to explain why pulse checking is important during independent exercise;Long Term: Able to check pulse independently and accurately       Understanding of Exercise Prescription Yes       Intervention Provide education, explanation, and written materials on patient's individual exercise prescription       Expected Outcomes Short Term: Able to explain program exercise prescription;Long Term: Able to explain home exercise prescription to exercise independently              Exercise Goals Re-Evaluation :  Exercise Goals Re-Evaluation    Row Name 07/16/20 1610             Exercise Goal Re-Evaluation   Exercise Goals Review Increase Physical Activity;Increase Strength and Stamina;Able to understand and use rate of perceived exertion (RPE) scale;Able to check pulse independently;Understanding of Exercise Prescription;Knowledge and understanding of Target Heart Rate Range (THRR)       Comments Pt's first day of exercise in the [facility] program. Pt tolerated exercise prescription well and understnads the exercise prescription, RPE scale and THRR.       Expected Outcomes Will continue to monitor and progress patient as tolerated.              Discharge Exercise Prescription (Final Exercise Prescription Changes):  Exercise Prescription Changes -  07/16/20 1610      Response to Exercise   Blood Pressure (Admit) 100/54    Blood Pressure (Exercise) 130/80    Blood Pressure (Exit) 100/68    Heart Rate (  Admit) 85 bpm    Heart Rate (Exercise) 89 bpm    Heart Rate (Exit) 72 bpm    Rating of Perceived Exertion (Exercise) 13    Symptoms None    Comments Pt's first day of exercise    Duration Progress to 30 minutes of  aerobic without signs/symptoms of physical distress    Intensity THRR unchanged      Resistance Training   Training Prescription Yes    Weight 3    Reps 10-15      Interval Training   Interval Training No      NuStep   Level 2    SPM 75    Minutes 15    METs 1.8      Arm Ergometer   Level 2    Watts 2    Minutes 15           Nutrition:  Target Goals: Understanding of nutrition guidelines, daily intake of sodium 1500mg , cholesterol 200mg , calories 30% from fat and 7% or less from saturated fats, daily to have 5 or more servings of fruits and vegetables.  Biometrics:  Pre Biometrics - 07/12/20 1141      Pre Biometrics   Height 5\' 3"  (1.6 m)    Weight 184 lb 4.9 oz (83.6 kg)    Waist Circumference 43.5 inches    Hip Circumference 46 inches    Waist to Hip Ratio 0.95 %    BMI (Calculated) 32.66    Triceps Skinfold 35 mm    % Body Fat 46 %    Grip Strength 26 kg    Flexibility 15 in    Single Leg Stand 6.63 seconds            Nutrition Therapy Plan and Nutrition Goals:   Nutrition Assessments:   Nutrition Goals Re-Evaluation:   Nutrition Goals Re-Evaluation:   Nutrition Goals Discharge (Final Nutrition Goals Re-Evaluation):   Psychosocial: Target Goals: Acknowledge presence or absence of significant depression and/or stress, maximize coping skills, provide positive support system. Participant is able to verbalize types and ability to use techniques and skills needed for reducing stress and depression.  Initial Review & Psychosocial Screening:  Initial Psych Review & Screening  - 07/12/20 1225      Initial Review   Current issues with History of Depression;Current Psychotropic Meds      Family Dynamics   Good Support System? Yes   Diane [name] lives alone. Diane [name] has her children for support   Comments Diane [name]'s son has colon cancer.This has been a stressor for Diane [name] and her family      Barriers   Psychosocial barriers to participate in program The patient should benefit from training in stress management and relaxation.      Screening Interventions   Interventions Encouraged to exercise;Provide feedback about the scores to participant;To provide support and resources with identified psychosocial needs    Expected Outcomes Short Term goal: Identification and review with participant of any Quality of Life or Depression concerns found by scoring the questionnaire.;Long Term Goal: Stressors or current issues are controlled or eliminated.;Short Term goal: Utilizing psychosocial counselor, staff and physician to assist with identification of specific Stressors or current issues interfering with healing process. Setting desired goal for each stressor or current issue identified.           Quality of Life Scores:  Quality of Life - 07/12/20 1143      Quality of Life   Select Quality of Life  Quality of Life Scores   Health/Function Pre 16.37 %    Socioeconomic Pre 22.4 %    Psych/Spiritual Pre 24 %    Family Pre 21.5 %    GLOBAL Pre 19.78 %          Scores of 19 and below usually indicate a poorer quality of life in these areas.  A difference of  2-3 points is a clinically meaningful difference.  A difference of 2-3 points in the total score of the Quality of Life Index has been associated with significant improvement in overall quality of life, self-image, physical symptoms, and general health in studies assessing change in quality of life.  PHQ-9: Recent Review Flowsheet Data    Depression screen [facility] 2/9 07/12/2020 10/13/2019 10/12/2019 04/25/2019 09/29/2018   Decreased  Interest 0 0 0 0 0   Down, Depressed, Hopeless 0 0 0 1 0   PHQ - 2 Score 0 0 0 1 0   Altered sleeping - 3 - 3 -   Tired, decreased energy - 1 - 2 -   Change in appetite - 0 - 1 -   Feeling bad or failure about yourself  - 0 - 2 -   Trouble concentrating - 1 - 2 -   Moving slowly or fidgety/restless - 1 - 3 -   Suicidal thoughts - 0 - 0 -   PHQ-9 Score - 6 - 14 -   Difficult doing work/chores - Somewhat difficult - - -     Interpretation of Total Score  Total Score Depression Severity:  1-4 = Minimal depression, 5-9 = Mild depression, 10-14 = Moderate depression, 15-19 = Moderately severe depression, 20-27 = Severe depression   Psychosocial Evaluation and Intervention:  Psychosocial Evaluation - 07/16/20 1516      Psychosocial Evaluation & Interventions   Interventions Encouraged to exercise with the program and follow exercise prescription;Stress management education    Comments Diane [name] presents to her first cardiac rehab exercise session with a positive outlook and attitude. She does admit to chronic depression but feels her symptoms are managed at this time. Her son has colon cancer and this is a stressor for her and her family. She has a strong support system and enjoys sewing, quilting, and reading as hobbies and stress management tools. She is a retired [employer] scientist.    Expected Outcomes Diane [name] will continue to maintain a positive attitude and outlook. She will monitor her depression symptoms and notify cardiac rehab RN if interventions are needed.    Continue Psychosocial Services  Follow up required by staff           Psychosocial Re-Evaluation:  Psychosocial Re-Evaluation    [place] Name 08/07/20 1216 08/07/20 1217           Psychosocial Re-Evaluation   Current issues with None Identified --      Comments -- None identified at last exercise session. Will re-evaluate upon patients return.      Expected Outcomes -- Diane [name] will continue to maintain a positive attitude  and outlook. She will monitor her depression symptoms and notify cardiac rehab RN if interventions are needed.      Interventions Encouraged to attend Cardiac Rehabilitation for the exercise Encouraged to attend Cardiac Rehabilitation for the exercise      Continue Psychosocial Services  -- Follow up required by staff             Psychosocial Discharge (Final Psychosocial Re-Evaluation):  Psychosocial Re-Evaluation - 08/07/20 1217  Psychosocial Re-Evaluation   Comments None identified at last exercise session. Will re-evaluate upon patients return.    Expected Outcomes Diane [name] will continue to maintain a positive attitude and outlook. She will monitor her depression symptoms and notify cardiac rehab RN if interventions are needed.    Interventions Encouraged to attend Cardiac Rehabilitation for the exercise    Continue Psychosocial Services  Follow up required by staff           Vocational Rehabilitation: Provide vocational rehab assistance to qualifying candidates.   Vocational Rehab Evaluation & Intervention:  Vocational Rehab - 07/12/20 1229      Initial Vocational Rehab Evaluation & Intervention   Assessment shows need for Vocational Rehabilitation No   Diane [name] is retired and does not need vocational rehab at this time          Education: Education Goals: Education classes will be provided on a weekly basis, covering required topics. Participant will state understanding/return demonstration of topics presented.  Learning Barriers/Preferences:  Learning Barriers/Preferences - 07/12/20 1146      Learning Barriers/Preferences   Learning Barriers Sight    Learning Preferences Written Material;Skilled Demonstration;Verbal Instruction           Education Topics: Count Your Pulse:  -Group instruction provided by verbal instruction, demonstration, patient participation and written materials to support subject.  Instructors address importance of being able to find your  pulse and how to count your pulse when at home without a heart monitor.  Patients get hands on experience counting their pulse with staff help and individually.   Heart Attack, Angina, and Risk Factor Modification:  -Group instruction provided by verbal instruction, video, and written materials to support subject.  Instructors address signs and symptoms of angina and heart attacks.    Also discuss risk factors for heart disease and how to make changes to improve heart health risk factors.   Functional Fitness:  -Group instruction provided by verbal instruction, demonstration, patient participation, and written materials to support subject.  Instructors address safety measures for doing things around the house.  Discuss how to get up and down off the floor, how to pick things up properly, how to safely get out of a chair without assistance, and balance training.   Meditation and Mindfulness:  -Group instruction provided by verbal instruction, patient participation, and written materials to support subject.  Instructor addresses importance of mindfulness and meditation practice to help reduce stress and improve awareness.  Instructor also leads participants through a meditation exercise.    Stretching for Flexibility and Mobility:  -Group instruction provided by verbal instruction, patient participation, and written materials to support subject.  Instructors lead participants through series of stretches that are designed to increase flexibility thus improving mobility.  These stretches are additional exercise for major muscle groups that are typically performed during regular warm up and cool down.   Hands Only CPR:  -Group verbal, video, and participation provides a basic overview of AHA guidelines for community CPR. Role-play of emergencies allow participants the opportunity to practice calling for help and chest compression technique with discussion of AED use.   Hypertension: -Group verbal  and written instruction that provides a basic overview of hypertension including the most recent diagnostic guidelines, risk factor reduction with self-care instructions and medication management.    Nutrition I class: Heart Healthy Eating:  -Group instruction provided by PowerPoint slides, verbal discussion, and written materials to support subject matter. The instructor gives an explanation and review of the Therapeutic Lifestyle  Changes diet recommendations, which includes a discussion on lipid goals, dietary fat, sodium, fiber, plant stanol/sterol esters, sugar, and the components of a well-balanced, healthy diet.   Nutrition II class: Lifestyle Skills:  -Group instruction provided by PowerPoint slides, verbal discussion, and written materials to support subject matter. The instructor gives an explanation and review of label reading, grocery shopping for heart health, heart healthy recipe modifications, and ways to make healthier choices when eating out.   Diabetes Question & Answer:  -Group instruction provided by PowerPoint slides, verbal discussion, and written materials to support subject matter. The instructor gives an explanation and review of diabetes co-morbidities, pre- and post-prandial blood glucose goals, pre-exercise blood glucose goals, signs, symptoms, and treatment of hypoglycemia and hyperglycemia, and foot care basics.   Diabetes Blitz:  -Group instruction provided by PowerPoint slides, verbal discussion, and written materials to support subject matter. The instructor gives an explanation and review of the physiology behind type 1 and type 2 diabetes, diabetes medications and rational behind using different medications, pre- and post-prandial blood glucose recommendations and Hemoglobin A1c goals, diabetes diet, and exercise including blood glucose guidelines for exercising safely.    Portion Distortion:  -Group instruction provided by PowerPoint slides, verbal discussion,  written materials, and food models to support subject matter. The instructor gives an explanation of serving size versus portion size, changes in portions sizes over the last 20 years, and what consists of a serving from each food group.   Stress Management:  -Group instruction provided by verbal instruction, video, and written materials to support subject matter.  Instructors review role of stress in heart disease and how to cope with stress positively.     Exercising on Your Own:  -Group instruction provided by verbal instruction, power point, and written materials to support subject.  Instructors discuss benefits of exercise, components of exercise, frequency and intensity of exercise, and end points for exercise.  Also discuss use of nitroglycerin and activating EMS.  Review options of places to exercise outside of rehab.  Review guidelines for sex with heart disease.   Cardiac Drugs I:  -Group instruction provided by verbal instruction and written materials to support subject.  Instructor reviews cardiac drug classes: antiplatelets, anticoagulants, beta blockers, and statins.  Instructor discusses reasons, side effects, and lifestyle considerations for each drug class.   Cardiac Drugs II:  -Group instruction provided by verbal instruction and written materials to support subject.  Instructor reviews cardiac drug classes: angiotensin converting enzyme inhibitors (ACE-I), angiotensin II receptor blockers (ARBs), nitrates, and calcium channel blockers.  Instructor discusses reasons, side effects, and lifestyle considerations for each drug class.   Anatomy and Physiology of the Circulatory System:  Group verbal and written instruction and models provide basic cardiac anatomy and physiology, with the coronary electrical and arterial systems. Review of: AMI, Angina, Valve disease, Heart Failure, Peripheral Artery Disease, Cardiac Arrhythmia, Pacemakers, and the ICD.   Other Education:  -Group  or individual verbal, written, or video instructions that support the educational goals of the cardiac rehab program.   Holiday Eating Survival Tips:  -Group instruction provided by PowerPoint slides, verbal discussion, and written materials to support subject matter. The instructor gives patients tips, tricks, and techniques to help them not only survive but enjoy the holidays despite the onslaught of food that accompanies the holidays.   Knowledge Questionnaire Score:  Knowledge Questionnaire Score - 07/12/20 1143      Knowledge Questionnaire Score   Pre Score 19/24  Core Components/Risk Factors/Patient Goals at Admission:  Personal Goals and Risk Factors at Admission - 07/12/20 1229      Core Components/Risk Factors/Patient Goals on Admission    Weight Management Yes;Weight Loss    Intervention Weight Management: Develop a combined nutrition and exercise program designed to reach desired caloric intake, while maintaining appropriate intake of nutrient and fiber, sodium and fats, and appropriate energy expenditure required for the weight goal.;Weight Management: Provide education and appropriate resources to help participant work on and attain dietary goals.;Weight Management/Obesity: Establish reasonable short term and long term weight goals.;Obesity: Provide education and appropriate resources to help participant work on and attain dietary goals.    Admit Weight 184 lb 4.9 oz (83.6 kg)    Goal Weight: Long Term 174 lb (78.9 kg)    Expected Outcomes Short Term: Continue to assess and modify interventions until short term weight is achieved;Long Term: Adherence to nutrition and physical activity/exercise program aimed toward attainment of established weight goal;Weight Loss: Understanding of general recommendations for a balanced deficit meal plan, which promotes 1-2 lb weight loss per week and includes a negative energy balance of [phone] kcal/d;Understanding recommendations for  meals to include 15-35% energy as protein, 25-35% energy from fat, 35-60% energy from carbohydrates, less than 200mg  of dietary cholesterol, 20-35 gm of total fiber daily;Understanding of distribution of calorie intake throughout the day with the consumption of 4-5 meals/snacks    Hypertension Yes    Intervention Provide education on lifestyle modifcations including regular physical activity/exercise, weight management, moderate sodium restriction and increased consumption of fresh fruit, vegetables, and low fat dairy, alcohol moderation, and smoking cessation.;Monitor prescription use compliance.    Expected Outcomes Short Term: Continued assessment and intervention until BP is < 140/59mm HG in hypertensive participants. < 130/74mm HG in hypertensive participants with diabetes, heart failure or chronic kidney disease.;Long Term: Maintenance of blood pressure at goal levels.    Lipids Yes    Intervention Provide education and support for participant on nutrition & aerobic/resistive exercise along with prescribed medications to achieve LDL 70mg , HDL >40mg .    Expected Outcomes Short Term: Participant states understanding of desired cholesterol values and is compliant with medications prescribed. Participant is following exercise prescription and nutrition guidelines.;Long Term: Cholesterol controlled with medications as prescribed, with individualized exercise RX and with personalized nutrition plan. Value goals: LDL < 70mg , HDL > 40 mg.    Stress Yes    Intervention Offer individual and/or small group education and counseling on adjustment to heart disease, stress management and health-related lifestyle change. Teach and support self-help strategies.;Refer participants experiencing significant psychosocial distress to appropriate mental health specialists for further evaluation and treatment. When possible, include family members and significant others in education/counseling sessions.    Expected Outcomes  Short Term: Participant demonstrates changes in health-related behavior, relaxation and other stress management skills, ability to obtain effective social support, and compliance with psychotropic medications if prescribed.;Long Term: Emotional wellbeing is indicated by absence of clinically significant psychosocial distress or social isolation.           Core Components/Risk Factors/Patient Goals Review:   Goals and Risk Factor Review    Row Name 07/16/20 1521 08/07/20 1218           Core Components/Risk Factors/Patient Goals Review   Personal Goals Review Weight Management/Obesity;Lipids;Stress;Hypertension Weight Management/Obesity;Lipids;Stress;Hypertension      Review Diane [name] has multiple CAD risk factors. She is eager to participate in cardiac rehab for risk factor modifications including stress management. Her personal  goals are to improve her gait and balance, to start swimming again for exercise and attend group exercise classes. Longterm she wishes to improve her diet. She will work with department RD. Diane [name] has multiple CAD risk factors. She was eager at her last session to participate in cardiac rehab for risk factor modifications including stress management. Her personal goals are to improve her gait and balance, to start swimming again for exercise and attend group exercise classes. Longterm she wishes to improve her diet. She will work with department RD.      Expected Outcomes Diane [name] will continue to participate in CR for risk factor reduction. Diane [name] will continue to participate in CR for risk factor reduction.             Core Components/Risk Factors/Patient Goals at Discharge (Final Review):   Goals and Risk Factor Review - 08/07/20 1218      Core Components/Risk Factors/Patient Goals Review   Personal Goals Review Weight Management/Obesity;Lipids;Stress;Hypertension    Review Diane [name] has multiple CAD risk factors. She was eager at her last session to  participate in cardiac rehab for risk factor modifications including stress management. Her personal goals are to improve her gait and balance, to start swimming again for exercise and attend group exercise classes. Longterm she wishes to improve her diet. She will work with department RD.    Expected Outcomes Diane [name] will continue to participate in CR for risk factor reduction.           ITP Comments:  ITP Comments    Row Name 07/12/20 1137 07/16/20 1514 08/07/20 1205       ITP Comments Dr. Fransico Him, Medical Director 30 day ITP review: Diane [name] completed her first cardiac rehab exercist session today and tolerated very well. She denied cardiac complaints. VSS. Worked to an RPE of 13. Stated "this feels so good to exercise. I feel like I am finally taking a step forward towards good health". Will continue to monitor 30 day ITP review: Diane [name] has completed 3 exercise session since admission 07/12/20. She has had an extended secondary to the death of her mother [place]. She was scheduled to return 08/06/20 however call out of rehab related to running a low grade temp. Denied all other symptoms. Too few exercise sessions to evaluate progression towards goals of improving gait and balance and having the stamina and strength to participate in group exercise and swimming. Will re-evaluate over next 30 days.            Comments: see ITP Comments

## 2020-08-08 ENCOUNTER — Telehealth (HOSPITAL_COMMUNITY): Payer: Self-pay | Admitting: *Deleted

## 2020-08-08 ENCOUNTER — Other Ambulatory Visit: Payer: Self-pay

## 2020-08-08 ENCOUNTER — Encounter (HOSPITAL_COMMUNITY)
Admission: RE | Admit: 2020-08-08 | Discharge: 2020-08-08 | Disposition: A | Discharge status: Home / Self Care | Payer: Medicare HMO | Source: Ambulatory Visit | Attending: Internal Medicine | Admitting: Internal Medicine

## 2020-08-08 DIAGNOSIS — Z955 Presence of coronary angioplasty implant and graft: Secondary | ICD-10-CM | POA: Insufficient documentation

## 2020-08-08 NOTE — Telephone Encounter (Signed)
Spoke with the patient advised per infection prevention that she get tested for COVID 19 with results before returning to exercise at cardiac rehab. Patient states understanding. Will cancel appointments for the rest of the week.Barnet Pall, RN,BSN 08/08/2020 2:33 PM

## 2020-08-08 NOTE — Progress Notes (Signed)
Incomplete Session Note  Patient Details  Name: Diane [name] MRN: [id number] Date of Birth: 03/11/1951 Referring Provider:     CARDIAC REHAB PHASE II ORIENTATION from 07/12/2020 in [facility]  Referring Provider End, Harrell Gave MD      [name] did not complete her rehab session.  Patient reported that she had a temperature of 99.9 on Monday and had attended her mother's funeral on 08/02/20. Lelon Frohlich has not been vaccinated for COVID 19. After discussing with infection prevention it was advised that Ms Tuft not exercise today and be tested for COVID 19 prior to returning to group exercise. Ann did not exercise today.Barnet Pall, RN,BSN 08/08/2020 2:25 PM

## 2020-08-09 ENCOUNTER — Telehealth: Payer: Self-pay | Admitting: Internal Medicine

## 2020-08-09 NOTE — Telephone Encounter (Signed)
Patient had to delay rehab visit due to death of mother.   Rehab says she needs a covid test and order to resume program.  Please call

## 2020-08-10 ENCOUNTER — Encounter (HOSPITAL_COMMUNITY): Payer: Medicare HMO

## 2020-08-10 NOTE — Telephone Encounter (Signed)
Patient wanted Dr End to know she was not skipping cardiac rehab on purpose. Her mother recently died and when she called Cardiac Rehab they require a negative Covid test before beginning again since she was in a crowd at her house. She is in the process of finding a testing site and will get the covid test. Then she will restart Cardiac Rehab.  Advised I will note this in her chart. She was appreciative.

## 2020-08-13 ENCOUNTER — Encounter (HOSPITAL_COMMUNITY): Payer: Medicare HMO

## 2020-08-15 ENCOUNTER — Encounter (HOSPITAL_COMMUNITY): Payer: Medicare HMO

## 2020-08-17 ENCOUNTER — Telehealth (HOSPITAL_COMMUNITY): Payer: Self-pay

## 2020-08-17 ENCOUNTER — Encounter (HOSPITAL_COMMUNITY): Payer: Medicare HMO

## 2020-08-17 NOTE — Telephone Encounter (Signed)
Cardiac Rehab Note:  Unsuccessful telephone encounter to [name] to follow up on recent illness. [employer] compliant VM message left requesting call back to [phone].  Jayel Inks E. Rollene Rotunda RN, BSN [place]. [facility]  Cardiac and Pulmonary Rehabilitation Phone: [phone] Fax: [phone]

## 2020-08-20 ENCOUNTER — Encounter (HOSPITAL_COMMUNITY): Payer: Medicare HMO

## 2020-08-20 ENCOUNTER — Telehealth (HOSPITAL_COMMUNITY): Payer: Self-pay

## 2020-08-20 NOTE — Telephone Encounter (Signed)
Cardiac Rehab Note:  Incoming call from [name] requesting to return to her cardiac rehab exercise sessions. Ms. [name] had covid-like symptoms after attending her mothers funeral. She is now symptom free. States she had a negative covid test 08/08/20. She wishes to return to CR on Wednesday 08/22/20. She is instructed to bring a copy of her negative covid text. Patient agrees.  Diane [name] E. Diane Rotunda RN, BSN [place]. [facility]  Cardiac and Pulmonary Rehabilitation Phone: [phone] Fax: [phone]

## 2020-08-22 ENCOUNTER — Encounter (HOSPITAL_COMMUNITY)
Admission: RE | Admit: 2020-08-22 | Discharge: 2020-08-22 | Disposition: A | Discharge status: Home / Self Care | Payer: Medicare HMO | Source: Ambulatory Visit | Attending: Internal Medicine | Admitting: Internal Medicine

## 2020-08-22 ENCOUNTER — Other Ambulatory Visit: Payer: Self-pay

## 2020-08-22 VITALS — Wt 183.0 lb

## 2020-08-22 DIAGNOSIS — Z955 Presence of coronary angioplasty implant and graft: Secondary | ICD-10-CM | POA: Diagnosis present

## 2020-08-22 NOTE — Progress Notes (Signed)
Diane [name] 69 y.o. female Nutrition Note  Visit Diagnosis: S/P DES RCA 06/01/20  Past Medical History:  Diagnosis Date  . Arthritis   . CAD (coronary artery disease)    a. 09/2013 Cath: LM nl, LAD 30p, 25m, LCX nl, RCA 30p, EF 55-60%; b. 04/2017 MV: EF>65%, apical defect ->breast attenuation. Sm. area of isch cannot be excluded; c. 04/2020 Cor CTA: Ca2+ = 704 (96%). Sev RCA/LAD dzs; d. 05/2020 Cath/PCI: LM nl, LAD 30m, 70m/d, LCX 20p, RCA 80p (DFR 0.88-->2.5x12 Synergy XD DES).  . 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 Depression   . Diastolic dysfunction    a. 01/2013 Echo: EF 55-60%, no rwma, Gr1 DD, PASP 35mmHg; b. 04/2017 Echo: EF 65-70%, no rwma, Gr1 DD.  [name] GERD (gastroesophageal reflux disease)   . Hyperlipidemia   . Hypertension   . PONV (postoperative nausea and vomiting)   . Skin cancer of face 05/2015   basal cell  . Stroke [facility]) 2006   deaf in right ear     Medications reviewed.   Current Outpatient Medications:  .  aspirin EC 81 MG tablet, Take 1 tablet (81 mg total) by mouth daily., Disp: 90 tablet, Rfl: 3 .  atorvastatin (LIPITOR) 80 MG tablet, Take 1 tablet (80 mg total) by mouth daily., Disp: 90 tablet, Rfl: 0 .  benazepril-hydrochlorthiazide (LOTENSIN HCT) 20-12.5 MG tablet, Take 1 tablet by mouth daily., Disp: 7 tablet, Rfl: 0 .  budesonide-formoterol (SYMBICORT) 80-4.5 MCG/ACT inhaler, Inhale 2 puffs into the lungs 2 (two) times daily. (Patient taking differently: Inhale 2 puffs into the lungs 2 (two) times daily as needed (asthma). ), Disp: 1 Inhaler, Rfl: 12 .  cholecalciferol (VITAMIN D) 1000 UNITS tablet, Take 1,000 Units by mouth daily., Disp: , Rfl:  .  clopidogrel (PLAVIX) 75 MG tablet, TAKE 1 TABLET BY MOUTH EVERY DAY, Disp: 90 tablet, Rfl: 0 .  diazepam (VALIUM) 5 MG tablet, Take 1 tablet (5 mg total) by mouth every 6 (six) hours as needed. for anxiety, Disp: 90 tablet, Rfl:  2 .  diltiazem (CARDIZEM CD) 180 MG 24 hr capsule, TAKE 1 CAPSULE BY MOUTH EVERY DAY, Disp: 90 capsule, Rfl: 0 .  esomeprazole (NEXIUM) 20 MG capsule, Take 20 mg by mouth 2 (two) times daily before a meal. , Disp: , Rfl:  .  ezetimibe (ZETIA) 10 MG tablet, Take 1 tablet (10 mg total) by mouth daily., Disp: 7 tablet, Rfl: 0 .  fluticasone (FLONASE) 50 MCG/ACT nasal spray, SPRAY 2 SPRAYS INTO EACH NOSTRIL EVERY DAY (Patient taking differently: Place 1 spray into both nostrils daily as needed for allergies. ), Disp: 16 mL, Rfl: 5 .  nitroGLYCERIN (NITROSTAT) 0.4 MG SL tablet, Place 1 tablet (0.4 mg total) under the tongue every 5 (five) minutes as needed for chest pain. Maximum of 3 doses., Disp: 25 tablet, Rfl: 1 .  PROAIR HFA 108 (90 Base) MCG/ACT inhaler, INHALE 1 TO 2 PUFFS INTO THE LUNGS EVERY 6 HOURS AS NEEDED FOR WHEEZING OR SHORTNESS OF BREATH (Patient taking differently: Inhale 1-2 puffs into the lungs every 6 (six) hours as needed for wheezing or shortness of breath. ), Disp: 8.5 g, Rfl: 1 .  ranolazine (RANEXA) 500 MG 12 hr tablet, Take 1 tablet (500 mg total) by mouth 2 (two) times daily., Disp: 180 tablet, Rfl: 0 .  Rhubarb (ESTROVEN COMPLETE PO), Take 1  tablet by mouth daily., Disp: , Rfl:  .  sertraline (ZOLOFT) 100 MG tablet, TAKE 1 TABLET BY MOUTH EVERY DAY (PLZ SCHED VISIT WITH NEW PROVIDER FOR FUTURE FILLS), Disp: 30 tablet, Rfl: 1 .  valACYclovir (VALTREX) 1000 MG tablet, TAKE 1 TABLET BY MOUTH TWICE DAILY (Patient taking differently: Take 500 mg by mouth 2 (two) times daily as needed (fever blisters). ), Disp: 10 tablet, Rfl: 0   Ht Readings from Last 1 Encounters:  07/13/20 5\' 2"  (1.575 m)     Wt Readings from Last 3 Encounters:  07/13/20 183 lb (83 kg)  07/12/20 184 lb 4.9 oz (83.6 kg)  06/07/20 186 lb 2 oz (84.4 kg)     There is no height or weight on file to calculate BMI.   Social History   Tobacco Use  Smoking Status Never Smoker  Smokeless Tobacco Never Used   Tobacco Comment   LIVES WITH 2 SMOKERS      Lab Results  Component Value Date   CHOL 166 07/13/2020   Lab Results  Component Value Date   HDL 71 07/13/2020   Lab Results  Component Value Date   LDLCALC 70 07/13/2020   Lab Results  Component Value Date   TRIG 151 (H) 07/13/2020     Lab Results  Component Value Date   HGBA1C 5.2 10/28/2013     CBG (last 3)  No results for input(s): GLUCAP in the last 72 hours.   Nutrition Note  Spoke with pt. Nutrition Plan and Nutrition Survey goals reviewed with pt. Pt is following a Heart Healthy diet. Pt wants to lose wt. Pt has been trying to lose wt by trying to incorporate exercise.  She lost 130 lbs 7 years ago by walking 6 miles per day and decreasing portions. Gained 50 back over the years. Weight goal 160-165 lbs. Recently was less active and less mindful of nutrition due to depression/anxiety.  She watches sodium by reading labels, avoiding eating out, and no salt shaker.  Reviewed lipid panel.  She typically incorporates nutrient dense foods.  She enjoys exercise. She has been thinking about joining the [facility] to walk. Pt expressed understanding of the information reviewed.    Nutrition Diagnosis ? Obesity/overweight related to excessive energy intake as evidenced by a BMI 33.47 mg/m2  Nutrition Intervention ? Pt's individual nutrition plan reviewed with pt. ? Benefits of adopting Heart Healthy diet discussed when Medficts reviewed.   ? Continue client-centered nutrition education by RD, as part of interdisciplinary care.  Goal(s) ? Pt to identify food quantities necessary to achieve weight loss of 6-24 lb at graduation from cardiac rehab.  ? Pt to build a healthy plate including vegetables, fruits, whole grains, and low-fat dairy products in a heart healthy meal plan. ? Pt to begin home exercise and/or join a gym to exercise on days she is not in cardiac rehab  Plan:   Will provide client-centered nutrition  education as part of interdisciplinary care  Monitor and evaluate progress toward nutrition goal with team.   Michaele Offer, MS, RDN, LDN

## 2020-08-23 ENCOUNTER — Other Ambulatory Visit: Payer: Self-pay

## 2020-08-23 MED ORDER — BENAZEPRIL-HYDROCHLOROTHIAZIDE 20-12.5 MG PO TABS: 1.0000 | ORAL_TABLET | Freq: Every day | ORAL | 0 refills | Status: DC

## 2020-08-23 NOTE — Telephone Encounter (Signed)
Requested Prescriptions   Signed Prescriptions Disp Refills   benazepril-hydrochlorthiazide (LOTENSIN HCT) 20-12.5 MG tablet 90 tablet 0    Sig: Take 1 tablet by mouth daily.    Authorizing Provider: END, CHRISTOPHER    Ordering User: Raelene Bott, Tymia Streb L

## 2020-08-23 NOTE — Telephone Encounter (Signed)
*  STAT* If patient is at the pharmacy, call can be transferred to refill team.   1. Which medications need to be refilled? (please list name of each medication and dose if known) Benazepril-HCTZ   2. Which pharmacy/location (including street and city if local pharmacy) is medication to be sent to? [facility]. Do they need a 30 day or 90 day supply? [place]

## 2020-08-24 ENCOUNTER — Other Ambulatory Visit: Payer: Self-pay

## 2020-08-24 ENCOUNTER — Encounter (HOSPITAL_COMMUNITY)
Admission: RE | Admit: 2020-08-24 | Discharge: 2020-08-24 | Disposition: A | Discharge status: Home / Self Care | Payer: Medicare HMO | Source: Ambulatory Visit | Attending: Internal Medicine | Admitting: Internal Medicine

## 2020-08-24 DIAGNOSIS — Z955 Presence of coronary angioplasty implant and graft: Secondary | ICD-10-CM

## 2020-08-27 ENCOUNTER — Encounter (HOSPITAL_COMMUNITY): Payer: Medicare HMO

## 2020-08-29 ENCOUNTER — Encounter (HOSPITAL_COMMUNITY): Payer: Medicare HMO

## 2020-08-29 ENCOUNTER — Telehealth (HOSPITAL_COMMUNITY): Payer: Self-pay | Admitting: Family Medicine

## 2020-08-31 ENCOUNTER — Encounter (HOSPITAL_COMMUNITY): Payer: Medicare HMO

## 2020-09-04 ENCOUNTER — Encounter (HOSPITAL_COMMUNITY): Payer: Self-pay

## 2020-09-04 DIAGNOSIS — Z955 Presence of coronary angioplasty implant and graft: Secondary | ICD-10-CM

## 2020-09-04 NOTE — Progress Notes (Signed)
Cardiac Individual Treatment Plan  Patient Details  Name: Diane [name] MRN: [id number] Date of Birth: [birth year] Referring Provider:     CARDIAC REHAB PHASE II ORIENTATION from 07/12/2020 in [facility]  Referring Provider End, Harrell Gave MD      Initial Encounter Date:    CARDIAC REHAB PHASE II ORIENTATION from 07/12/2020 in [facility]  Date 07/12/20      Visit Diagnosis: S/P DES RCA 06/01/20  Patient's Home Medications on Admission:  Current Outpatient Medications:  .  aspirin EC 81 MG tablet, Take 1 tablet (81 mg total) by mouth daily., Disp: 90 tablet, Rfl: 3 .  atorvastatin (LIPITOR) 80 MG tablet, Take 1 tablet (80 mg total) by mouth daily., Disp: 90 tablet, Rfl: 0 .  benazepril-hydrochlorthiazide (LOTENSIN HCT) 20-12.5 MG tablet, Take 1 tablet by mouth daily., Disp: 90 tablet, Rfl: 0 .  budesonide-formoterol (SYMBICORT) 80-4.5 MCG/ACT inhaler, Inhale 2 puffs into the lungs 2 (two) times daily. (Patient taking differently: Inhale 2 puffs into the lungs 2 (two) times daily as needed (asthma). ), Disp: 1 Inhaler, Rfl: 12 .  cholecalciferol (VITAMIN D) 1000 UNITS tablet, Take 1,000 Units by mouth daily., Disp: , Rfl:  .  clopidogrel (PLAVIX) 75 MG tablet, TAKE 1 TABLET BY MOUTH EVERY DAY, Disp: 90 tablet, Rfl: 0 .  diazepam (VALIUM) 5 MG tablet, Take 1 tablet (5 mg total) by mouth every 6 (six) hours as needed. for anxiety, Disp: 90 tablet, Rfl: 2 .  diltiazem (CARDIZEM CD) 180 MG 24 hr capsule, TAKE 1 CAPSULE BY MOUTH EVERY DAY, Disp: 90 capsule, Rfl: 0 .  esomeprazole (NEXIUM) 20 MG capsule, Take 20 mg by mouth 2 (two) times daily before a meal. , Disp: , Rfl:  .  ezetimibe (ZETIA) 10 MG tablet, Take 1 tablet (10 mg total) by mouth daily., Disp: 7 tablet, Rfl: 0 .  fluticasone (FLONASE) 50 MCG/ACT nasal spray, SPRAY 2 SPRAYS INTO EACH NOSTRIL EVERY DAY (Patient taking differently: Place 1 spray into both nostrils  daily as needed for allergies. ), Disp: 16 mL, Rfl: 5 .  nitroGLYCERIN (NITROSTAT) 0.4 MG SL tablet, Place 1 tablet (0.4 mg total) under the tongue every 5 (five) minutes as needed for chest pain. Maximum of 3 doses., Disp: 25 tablet, Rfl: 1 .  PROAIR HFA 108 (90 Base) MCG/ACT inhaler, INHALE 1 TO 2 PUFFS INTO THE LUNGS EVERY 6 HOURS AS NEEDED FOR WHEEZING OR SHORTNESS OF BREATH (Patient taking differently: Inhale 1-2 puffs into the lungs every 6 (six) hours as needed for wheezing or shortness of breath. ), Disp: 8.5 g, Rfl: 1 .  ranolazine (RANEXA) 500 MG 12 hr tablet, Take 1 tablet (500 mg total) by mouth 2 (two) times daily., Disp: 180 tablet, Rfl: 0 .  Rhubarb (ESTROVEN COMPLETE PO), Take 1 tablet by mouth daily., Disp: , Rfl:  .  sertraline (ZOLOFT) 100 MG tablet, TAKE 1 TABLET BY MOUTH EVERY DAY (PLZ SCHED VISIT WITH NEW PROVIDER FOR FUTURE FILLS), Disp: 30 tablet, Rfl: 1 .  valACYclovir (VALTREX) 1000 MG tablet, TAKE 1 TABLET BY MOUTH TWICE DAILY (Patient taking differently: Take 500 mg by mouth 2 (two) times daily as needed (fever blisters). ), Disp: 10 tablet, Rfl: 0  Past Medical History: Past Medical History:  Diagnosis Date  . Arthritis   . CAD (coronary artery disease)    a. 09/2013 Cath: LM nl, LAD 30p, 72m, LCX nl, RCA 30p, EF 55-60%; b. 04/2017 MV: EF>65%,  apical defect ->breast attenuation. Sm. area of isch cannot be excluded; c. 04/2020 Cor CTA: Ca2+ = 704 (96%). Sev RCA/LAD dzs; d. 05/2020 Cath/PCI: LM nl, LAD 82m, 24m/d, LCX 20p, RCA 80p (DFR 0.88-->2.5x12 Synergy XD DES).  . 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 Depression   . Diastolic dysfunction    a. 01/2013 Echo: EF 55-60%, no rwma, Gr1 DD, PASP 85mmHg; b. 04/2017 Echo: EF 65-70%, no rwma, Gr1 DD.  [name] GERD (gastroesophageal reflux disease)   . Hyperlipidemia   . Hypertension   . PONV (postoperative nausea and vomiting)   . Skin cancer of face  05/2015   basal cell  . Stroke [facility]) 2006   deaf in right ear    Tobacco Use: Social History   Tobacco Use  Smoking Status Never Smoker  Smokeless Tobacco Never Used  Tobacco Comment   LIVES WITH 2 SMOKERS     Labs: Recent Review Flowsheet Data    Labs for ITP Cardiac and Pulmonary Rehab Latest Ref Rng & Units 06/17/2017 11/11/2017 09/08/2018 05/18/2020 07/13/2020   Cholestrol 100 - 199 mg/dL 193 212(H) 164 177 166   LDLCALC 0 - 99 mg/dL 91 112(H) 68 92 70   LDLDIRECT 0 - 99 mg/dL - - - 94 59   HDL >39 mg/dL 78 70 65.30 62 71   Trlycerides 0 - 149 mg/dL 118 152(H) 154.0(H) 133 151(H)   Hemoglobin A1c <5.7 % - - - - -   TCO2 0 - 100 mmol/L - - - - -      Capillary Blood Glucose: No results found for: GLUCAP   Exercise Target Goals: Exercise Program Goal: Individual exercise prescription set using results from initial 6 min walk test and THRR while considering  patient's activity barriers and safety.   Exercise Prescription Goal: Initial exercise prescription builds to 30-45 minutes a day of aerobic activity, 2-3 days per week.  Home exercise guidelines will be given to patient during program as part of exercise prescription that the participant will acknowledge.  Activity Barriers & Risk Stratification:  Activity Barriers & Cardiac Risk Stratification - 07/12/20 1157      Activity Barriers & Cardiac Risk Stratification   Activity Barriers Left Knee Replacement;Joint Problems;Balance Concerns    Cardiac Risk Stratification Moderate           6 Minute Walk:  6 Minute Walk    Row Name 07/12/20 1137         6 Minute Walk   Phase Initial     Distance 1254 feet     Walk Time 6 minutes     # of Rest Breaks 0     MPH 2.4     METS 2.8     RPE 12     Perceived Dyspnea  0     VO2 Peak 9.7     Symptoms Yes (comment)     Comments 5/10 Bilateral Hip Pain     Resting HR 77 bpm     Resting BP 122/78     Resting Oxygen Saturation  95 %     Exercise Oxygen Saturation   during 6 min walk 94 %     Max Ex. HR 98 bpm     Max Ex. BP 150/82     2 Minute Post BP 130/80  Oxygen Initial Assessment:   Oxygen Re-Evaluation:   Oxygen Discharge (Final Oxygen Re-Evaluation):   Initial Exercise Prescription:  Initial Exercise Prescription - 07/12/20 1100      Date of Initial Exercise RX and Referring Provider   Date 07/12/20    Referring Provider End, Harrell Gave MD    Expected Discharge Date 09/07/20      NuStep   Level 2    SPM 75    Minutes 15    METs 2.3      Arm Ergometer   Level 2    Watts 20    Minutes 15    METs 2.3      Prescription Details   Frequency (times per week) 3x    Duration Progress to 30 minutes of continuous aerobic without signs/symptoms of physical distress      Intensity   THRR 40-80% of Max Heartrate 61-122    Ratings of Perceived Exertion 11-13    Perceived Dyspnea 0-4      Progression   Progression Continue progressive overload as per policy without signs/symptoms or physical distress.      Resistance Training   Training Prescription Yes    Weight 3lbs    Reps 10-15           Perform Capillary Blood Glucose checks as needed.  Exercise Prescription Changes:   Exercise Prescription Changes    Row Name 07/16/20 1610             Response to Exercise   Blood Pressure (Admit) 100/54       Blood Pressure (Exercise) 130/80       Blood Pressure (Exit) 100/68       Heart Rate (Admit) 85 bpm       Heart Rate (Exercise) 89 bpm       Heart Rate (Exit) 72 bpm       Rating of Perceived Exertion (Exercise) 13       Symptoms None       Comments Pt's first day of exercise       Duration Progress to 30 minutes of  aerobic without signs/symptoms of physical distress       Intensity THRR unchanged         Resistance Training   Training Prescription Yes       Weight 3       Reps 10-15         Interval Training   Interval Training No         NuStep   Level 2       SPM 75       Minutes 15        METs 1.8         Arm Ergometer   Level 2       Watts 2       Minutes 15              Exercise Comments:   Exercise Comments    Row Name 08/03/20 0845 09/06/20 1437         Exercise Comments The patient has not attended [facility] program since 07/20/2020 due to the illness and subsequent death of her mother. She did complete 3 visits prior to her absence and was tolerating her exercise well. We look forward to the patient resuming the [facility] program soon. Pt has not attended [facility] program since 08/22/2020. Due to this fact there is no progress to report on this patient.  Exercise Goals and Review:   Exercise Goals    Row Name 07/12/20 1141             Exercise Goals   Increase Physical Activity Yes       Intervention Provide advice, education, support and counseling about physical activity/exercise needs.;Develop an individualized exercise prescription for aerobic and resistive training based on initial evaluation findings, risk stratification, comorbidities and participant's personal goals.       Expected Outcomes Short Term: Attend rehab on a regular basis to increase amount of physical activity.;Long Term: Add in home exercise to make exercise part of routine and to increase amount of physical activity.;Long Term: Exercising regularly at least 3-5 days a week.       Increase Strength and Stamina Yes       Intervention Provide advice, education, support and counseling about physical activity/exercise needs.;Develop an individualized exercise prescription for aerobic and resistive training based on initial evaluation findings, risk stratification, comorbidities and participant's personal goals.       Expected Outcomes Short Term: Increase workloads from initial exercise prescription for resistance, speed, and METs.;Short Term: Perform resistance training exercises routinely during rehab and add in resistance training at home;Long Term: Improve cardiorespiratory fitness,  muscular endurance and strength as measured by increased METs and functional capacity (6MWT)       Able to understand and use rate of perceived exertion (RPE) scale Yes       Intervention Provide education and explanation on how to use RPE scale       Expected Outcomes Short Term: Able to use RPE daily in rehab to express subjective intensity level;Long Term:  Able to use RPE to guide intensity level when exercising independently       Knowledge and understanding of Target Heart Rate Range (THRR) Yes       Intervention Provide education and explanation of THRR including how the numbers were predicted and where they are located for reference       Expected Outcomes Short Term: Able to state/look up THRR;Long Term: Able to use THRR to govern intensity when exercising independently;Short Term: Able to use daily as guideline for intensity in rehab       Able to check pulse independently Yes       Intervention Provide education and demonstration on how to check pulse in carotid and radial arteries.;Review the importance of being able to check your own pulse for safety during independent exercise       Expected Outcomes Short Term: Able to explain why pulse checking is important during independent exercise;Long Term: Able to check pulse independently and accurately       Understanding of Exercise Prescription Yes       Intervention Provide education, explanation, and written materials on patient's individual exercise prescription       Expected Outcomes Short Term: Able to explain program exercise prescription;Long Term: Able to explain home exercise prescription to exercise independently              Exercise Goals Re-Evaluation :  Exercise Goals Re-Evaluation    Row Name 07/16/20 1610             Exercise Goal Re-Evaluation   Exercise Goals Review Increase Physical Activity;Increase Strength and Stamina;Able to understand and use rate of perceived exertion (RPE) scale;Able to check pulse  independently;Understanding of Exercise Prescription;Knowledge and understanding of Target Heart Rate Range (THRR)       Comments Pt's first day of exercise in the  [facility] program. Pt tolerated exercise prescription well and understnads the exercise prescription, RPE scale and THRR.       Expected Outcomes Will continue to monitor and progress patient as tolerated.              Discharge Exercise Prescription (Final Exercise Prescription Changes):  Exercise Prescription Changes - 07/16/20 1610      Response to Exercise   Blood Pressure (Admit) 100/54    Blood Pressure (Exercise) 130/80    Blood Pressure (Exit) 100/68    Heart Rate (Admit) 85 bpm    Heart Rate (Exercise) 89 bpm    Heart Rate (Exit) 72 bpm    Rating of Perceived Exertion (Exercise) 13    Symptoms None    Comments Pt's first day of exercise    Duration Progress to 30 minutes of  aerobic without signs/symptoms of physical distress    Intensity THRR unchanged      Resistance Training   Training Prescription Yes    Weight 3    Reps 10-15      Interval Training   Interval Training No      NuStep   Level 2    SPM 75    Minutes 15    METs 1.8      Arm Ergometer   Level 2    Watts 2    Minutes 15           Nutrition:  Target Goals: Understanding of nutrition guidelines, daily intake of sodium 1500mg , cholesterol 200mg , calories 30% from fat and 7% or less from saturated fats, daily to have 5 or more servings of fruits and vegetables.  Biometrics:  Pre Biometrics - 07/12/20 1141      Pre Biometrics   Height 5\' 3"  (1.6 m)    Weight 184 lb 4.9 oz (83.6 kg)    Waist Circumference 43.5 inches    Hip Circumference 46 inches    Waist to Hip Ratio 0.95 %    BMI (Calculated) 32.66    Triceps Skinfold 35 mm    % Body Fat 46 %    Grip Strength 26 kg    Flexibility 15 in    Single Leg Stand 6.63 seconds            Nutrition Therapy Plan and Nutrition Goals:  Nutrition Therapy & Goals - 08/22/20 1438        Nutrition Therapy   Diet Heart Healthy    Drug/Food Interactions Statins/Certain Fruits      Personal Nutrition Goals   Nutrition Goal Pt to identify food quantities necessary to achieve weight loss of 6-24 lb at graduation from cardiac rehab.    Personal Goal #2 Pt to build a healthy plate including vegetables, fruits, whole grains, and low-fat dairy products in a heart healthy meal plan    Personal Goal #3 Pt to begin home exercise and/or join a gym to exercise on days she is not in cardiac rehab      Intervention Plan   Intervention Prescribe, educate and counsel regarding individualized specific dietary modifications aiming towards targeted core components such as weight, hypertension, lipid management, diabetes, heart failure and other comorbidities.;Nutrition handout(s) given to patient.    Expected Outcomes Short Term Goal: Understand basic principles of dietary content, such as calories, fat, sodium, cholesterol and nutrients.           Nutrition Assessments:   Nutrition Goals Re-Evaluation:  Nutrition Goals Re-Evaluation    [place] Name 08/22/20 1438  Goals   Current Weight 183 lb (83 kg)              Nutrition Goals Re-Evaluation:  Nutrition Goals Re-Evaluation    [place] Name 08/22/20 1438             Goals   Current Weight 183 lb (83 kg)              Nutrition Goals Discharge (Final Nutrition Goals Re-Evaluation):  Nutrition Goals Re-Evaluation - 08/22/20 1438      Goals   Current Weight 183 lb (83 kg)           Psychosocial: Target Goals: Acknowledge presence or absence of significant depression and/or stress, maximize coping skills, provide positive support system. Participant is able to verbalize types and ability to use techniques and skills needed for reducing stress and depression.  Initial Review & Psychosocial Screening:  Initial Psych Review & Screening - 07/12/20 1225      Initial Review   Current issues with History of  Depression;Current Psychotropic Meds      Family Dynamics   Good Support System? Yes   Ann lives alone. Lelon Frohlich has her children for support   Comments Ann's son has colon cancer.This has been a stressor for Lelon Frohlich and her family      Barriers   Psychosocial barriers to participate in program The patient should benefit from training in stress management and relaxation.      Screening Interventions   Interventions Encouraged to exercise;Provide feedback about the scores to participant;To provide support and resources with identified psychosocial needs    Expected Outcomes Short Term goal: Identification and review with participant of any Quality of Life or Depression concerns found by scoring the questionnaire.;Long Term Goal: Stressors or current issues are controlled or eliminated.;Short Term goal: Utilizing psychosocial counselor, staff and physician to assist with identification of specific Stressors or current issues interfering with healing process. Setting desired goal for each stressor or current issue identified.           Quality of Life Scores:  Quality of Life - 07/12/20 1143      Quality of Life   Select Quality of Life      Quality of Life Scores   Health/Function Pre 16.37 %    Socioeconomic Pre 22.4 %    Psych/Spiritual Pre 24 %    Family Pre 21.5 %    GLOBAL Pre 19.78 %          Scores of 19 and below usually indicate a poorer quality of life in these areas.  A difference of  2-3 points is a clinically meaningful difference.  A difference of 2-3 points in the total score of the Quality of Life Index has been associated with significant improvement in overall quality of life, self-image, physical symptoms, and general health in studies assessing change in quality of life.  PHQ-9: Recent Review Flowsheet Data    Depression screen [facility] 2/9 07/12/2020 10/13/2019 10/12/2019 04/25/2019 09/29/2018   Decreased Interest 0 0 0 0 0   Down, Depressed, Hopeless 0 0 0 1 0   PHQ - 2  Score 0 0 0 1 0   Altered sleeping - 3 - 3 -   Tired, decreased energy - 1 - 2 -   Change in appetite - 0 - 1 -   Feeling bad or failure about yourself  - 0 - 2 -   Trouble concentrating - 1 - 2 -   Moving slowly  or fidgety/restless - 1 - 3 -   Suicidal thoughts - 0 - 0 -   PHQ-9 Score - 6 - 14 -   Difficult doing work/chores - Somewhat difficult - - -     Interpretation of Total Score  Total Score Depression Severity:  1-4 = Minimal depression, 5-9 = Mild depression, 10-14 = Moderate depression, 15-19 = Moderately severe depression, 20-27 = Severe depression   Psychosocial Evaluation and Intervention:  Psychosocial Evaluation - 07/16/20 1516      Psychosocial Evaluation & Interventions   Interventions Encouraged to exercise with the program and follow exercise prescription;Stress management education    Comments Ms. Jerry presents to her first cardiac rehab exercise session with a positive outlook and attitude. She does admit to chronic depression but feels her symptoms are managed at this time. Her son has colon cancer and this is a stressor for her and her family. She has a strong support system and enjoys sewing, quilting, and reading as hobbies and stress management tools. She is a retired [employer] scientist.    Expected Outcomes Ms. Goldberger will continue to maintain a positive attitude and outlook. She will monitor her depression symptoms and notify cardiac rehab RN if interventions are needed.    Continue Psychosocial Services  Follow up required by staff           Psychosocial Re-Evaluation:  Psychosocial Re-Evaluation    [place] Name 08/07/20 1216 08/07/20 1217 09/04/20 1649         Psychosocial Re-Evaluation   Current issues with None Identified -- --     Comments -- None identified at last exercise session. Will re-evaluate upon patients return. None identified at last exercise session. Will re-evaluate upon patients return.     Expected Outcomes -- Ms. Warwick will continue to  maintain a positive attitude and outlook. She will monitor her depression symptoms and notify cardiac rehab RN if interventions are needed. Ms. Grays will continue to maintain a positive attitude and outlook. She will monitor her depression symptoms and notify cardiac rehab RN if interventions are needed.     Interventions Encouraged to attend Cardiac Rehabilitation for the exercise Encouraged to attend Cardiac Rehabilitation for the exercise Encouraged to attend Cardiac Rehabilitation for the exercise     Continue Psychosocial Services  -- Follow up required by staff Follow up required by staff            Psychosocial Discharge (Final Psychosocial Re-Evaluation):  Psychosocial Re-Evaluation - 09/04/20 1649      Psychosocial Re-Evaluation   Comments None identified at last exercise session. Will re-evaluate upon patients return.    Expected Outcomes Ms. Heckert will continue to maintain a positive attitude and outlook. She will monitor her depression symptoms and notify cardiac rehab RN if interventions are needed.    Interventions Encouraged to attend Cardiac Rehabilitation for the exercise    Continue Psychosocial Services  Follow up required by staff           Vocational Rehabilitation: Provide vocational rehab assistance to qualifying candidates.   Vocational Rehab Evaluation & Intervention:  Vocational Rehab - 07/12/20 1229      Initial Vocational Rehab Evaluation & Intervention   Assessment shows need for Vocational Rehabilitation No   Lelon Frohlich is retired and does not need vocational rehab at this time          Education: Education Goals: Education classes will be provided on a weekly basis, covering required topics. Participant will state understanding/return demonstration of topics presented.  Learning Barriers/Preferences:  Learning Barriers/Preferences - 07/12/20 1146      Learning Barriers/Preferences   Learning Barriers Sight    Learning Preferences Written  Material;Skilled Demonstration;Verbal Instruction           Education Topics: Count Your Pulse:  -Group instruction provided by verbal instruction, demonstration, patient participation and written materials to support subject.  Instructors address importance of being able to find your pulse and how to count your pulse when at home without a heart monitor.  Patients get hands on experience counting their pulse with staff help and individually.   Heart Attack, Angina, and Risk Factor Modification:  -Group instruction provided by verbal instruction, video, and written materials to support subject.  Instructors address signs and symptoms of angina and heart attacks.    Also discuss risk factors for heart disease and how to make changes to improve heart health risk factors.   Functional Fitness:  -Group instruction provided by verbal instruction, demonstration, patient participation, and written materials to support subject.  Instructors address safety measures for doing things around the house.  Discuss how to get up and down off the floor, how to pick things up properly, how to safely get out of a chair without assistance, and balance training.   Meditation and Mindfulness:  -Group instruction provided by verbal instruction, patient participation, and written materials to support subject.  Instructor addresses importance of mindfulness and meditation practice to help reduce stress and improve awareness.  Instructor also leads participants through a meditation exercise.    Stretching for Flexibility and Mobility:  -Group instruction provided by verbal instruction, patient participation, and written materials to support subject.  Instructors lead participants through series of stretches that are designed to increase flexibility thus improving mobility.  These stretches are additional exercise for major muscle groups that are typically performed during regular warm up and cool down.   Hands Only  CPR:  -Group verbal, video, and participation provides a basic overview of AHA guidelines for community CPR. Role-play of emergencies allow participants the opportunity to practice calling for help and chest compression technique with discussion of AED use.   Hypertension: -Group verbal and written instruction that provides a basic overview of hypertension including the most recent diagnostic guidelines, risk factor reduction with self-care instructions and medication management.    Nutrition I class: Heart Healthy Eating:  -Group instruction provided by PowerPoint slides, verbal discussion, and written materials to support subject matter. The instructor gives an explanation and review of the Therapeutic Lifestyle Changes diet recommendations, which includes a discussion on lipid goals, dietary fat, sodium, fiber, plant stanol/sterol esters, sugar, and the components of a well-balanced, healthy diet.   Nutrition II class: Lifestyle Skills:  -Group instruction provided by PowerPoint slides, verbal discussion, and written materials to support subject matter. The instructor gives an explanation and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

## 2020-09-05 ENCOUNTER — Encounter (HOSPITAL_COMMUNITY): Payer: Medicare HMO

## 2020-09-07 ENCOUNTER — Encounter (HOSPITAL_COMMUNITY): Payer: Medicare HMO

## 2020-09-12 ENCOUNTER — Encounter: Payer: Medicare HMO | Admitting: Family Medicine

## 2020-09-12 NOTE — Progress Notes (Deleted)
Diane [name] is a 69 y.o. female  No chief complaint on file.   HPI: Diane [name] is a 69 y.o. female who is formerly a patient of Dr. Deborra Medina who is seen today to discuss    Past Medical History:  Diagnosis Date  . Arthritis   . CAD (coronary artery disease)    a. 09/2013 Cath: LM nl, LAD 30p, 36m, LCX nl, RCA 30p, EF 55-60%; b. 04/2017 MV: EF>65%, apical defect ->breast attenuation. Sm. area of isch cannot be excluded; c. 04/2020 Cor CTA: Ca2+ = 704 (96%). Sev RCA/LAD dzs; d. 05/2020 Cath/PCI: LM nl, LAD 67m, 57m/d, LCX 20p, RCA 80p (DFR 0.88-->2.5x12 Synergy XD DES).  . 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 Depression   . Diastolic dysfunction    a. 01/2013 Echo: EF 55-60%, no rwma, Gr1 DD, PASP 67mmHg; b. 04/2017 Echo: EF 65-70%, no rwma, Gr1 DD.  [name] GERD (gastroesophageal reflux disease)   . Hyperlipidemia   . Hypertension   . PONV (postoperative nausea and vomiting)   . Skin cancer of face 05/2015   basal cell  . Stroke [facility]) 2006   deaf in right ear    Past Surgical History:  Procedure Laterality Date  . BREAST BIOPSY Left 1984   abcess  . CARDIAC CATHETERIZATION    . CESAREAN SECTION    . CHOLECYSTECTOMY  1990  . CHONDROPLASTY Left 01/05/2015   Procedure: CHONDROPLASTY;  Surgeon: Yvette Rack., MD;  Location: [facility];  Service: Orthopedics;  Laterality: Left;  . CORONARY STENT INTERVENTION N/A 06/01/2020   Procedure: CORONARY STENT INTERVENTION;  Surgeon: Nelva Bush, MD;  Location: [facility];  Service: Cardiovascular;  Laterality: N/A;  . INTRAVASCULAR PRESSURE WIRE/FFR STUDY N/A 06/01/2020   Procedure: INTRAVASCULAR PRESSURE WIRE/FFR STUDY;  Surgeon: Nelva Bush, MD;  Location: [facility];  Service: Cardiovascular;  Laterality: N/A;  . KNEE ARTHROSCOPY WITH MEDIAL MENISECTOMY Left 01/05/2015   Procedure: LEFT KNEE ARTHROSCOPY WITH MEDIAL  AND LATERAL MENISECTOMY; DEBRIDEMENT LATERAL PATELLA FEMORAL ;  Surgeon: Yvette Rack., MD;  Location: [facility];  Service: Orthopedics;  Laterality: Left;  . LEFT HEART CATH AND CORONARY ANGIOGRAPHY N/A 06/01/2020   Procedure: LEFT HEART CATH AND CORONARY ANGIOGRAPHY;  Surgeon: Nelva Bush, MD;  Location: [facility];  Service: Cardiovascular;  Laterality: N/A;  . LEFT HEART CATHETERIZATION WITH CORONARY ANGIOGRAM N/A 10/28/2013   Procedure: LEFT HEART CATHETERIZATION WITH CORONARY ANGIOGRAM;  Surgeon: Larey Dresser, MD;  Location: [facility];  Service: Cardiovascular;  Laterality: N/A;  . TOTAL KNEE ARTHROPLASTY Left 07/06/2015   Procedure: TOTAL KNEE ARTHROPLASTY;  Surgeon: Earlie Server, MD;  Location: [facility];  Service: Orthopedics;  Laterality: Left;  . TUBAL LIGATION      Social History   Socioeconomic History  . Marital status: Significant Other    Spouse name: [name]  . Number of children: 8  . Years of education: 11  . Highest education level: Not on file  Occupational History  . Occupation: Retired  Tobacco Use  . Smoking status: Never Smoker  . Smokeless tobacco: Never Used  . Tobacco comment: LIVES WITH 2 SMOKERS   Vaping Use  . Vaping Use: Never used  Substance and Sexual Activity  . Alcohol use: Yes    Comment: 1 glass of wine per  month  . Drug use: No  . Sexual activity: Yes    Birth control/protection: Post-menopausal  Other Topics Concern  . Not on file  Social History Narrative   Divorced, but has fiancee.  Lives with fiancee in [place].  Moved here recently from [place], [place].     She 8 children- ages 7- 8.               Social Determinants of Health   Financial Resource Strain:   . Difficulty of Paying Living Expenses: Not on file  Food Insecurity:   . Worried About Charity fundraiser in the Last Year: Not on file  . Ran Out of Food in the Last Year: Not on file  Transportation Needs:   . Lack of  Transportation (Medical): Not on file  . Lack of Transportation (Non-Medical): Not on file  Physical Activity:   . Days of Exercise per Week: Not on file  . Minutes of Exercise per Session: Not on file  Stress:   . Feeling of Stress : Not on file  Social Connections:   . Frequency of Communication with Friends and Family: Not on file  . Frequency of Social Gatherings with Friends and Family: Not on file  . Attends Religious Services: Not on file  . Active Member of Clubs or Organizations: Not on file  . Attends Archivist Meetings: Not on file  . Marital Status: Not on file  Intimate Partner Violence:   . Fear of Current or Ex-Partner: Not on file  . Emotionally Abused: Not on file  . Physically Abused: Not on file  . Sexually Abused: Not on file    Family History  Problem Relation Age of Onset  . Healthy Mother   . Hypertension Mother   . Stroke Mother   . Stroke Father   . Heart failure Father   . Heart disease Father        CABG  . Ovarian cancer Paternal Grandmother   . Liver cancer Paternal Grandfather   . Stroke Maternal Grandmother   . Heart failure Maternal Grandmother   . Heart failure Maternal Grandfather   . Juvenile Diabetes Son   . Healthy Son   . Healthy Son   . Hypertension Son   . Healthy Son   . Hypertension Son   . Healthy Son   . Healthy Daughter   . Healthy Daughter   . Healthy Daughter   . Healthy Daughter   . Colon cancer Neg Hx   . Stomach cancer Neg Hx   . Rectal cancer Neg Hx   . Esophageal cancer Neg Hx   . Breast cancer Neg Hx      Immunization History  Administered Date(s) Administered  . Influenza,inj,Quad PF,6+ Mos 02/27/2015, 09/08/2018  . Influenza-Unspecified 10/04/2015, 11/13/2016  . Pneumococcal Conjugate-13 12/31/2017  . Pneumococcal Polysaccharide-23 01/06/2019  . Tdap 07/24/2014  . Zoster Recombinat (Shingrix) 04/21/2018, 08/05/2018    Outpatient Encounter Medications as of 09/12/2020  Medication Sig  .  aspirin EC 81 MG tablet Take 1 tablet (81 mg total) by mouth daily.  [name] atorvastatin (LIPITOR) 80 MG tablet Take 1 tablet (80 mg total) by mouth daily.  . benazepril-hydrochlorthiazide (LOTENSIN HCT) 20-12.5 MG tablet Take 1 tablet by mouth daily.  . budesonide-formoterol (SYMBICORT) 80-4.5 MCG/ACT inhaler Inhale 2 puffs into the lungs 2 (two) times daily. (Patient taking differently: Inhale 2 puffs into the lungs 2 (two) times daily as needed (asthma). )  . cholecalciferol (VITAMIN  D) 1000 UNITS tablet Take 1,000 Units by mouth daily.  . clopidogrel (PLAVIX) 75 MG tablet TAKE 1 TABLET BY MOUTH EVERY DAY  . diazepam (VALIUM) 5 MG tablet Take 1 tablet (5 mg total) by mouth every 6 (six) hours as needed. for anxiety  . diltiazem (CARDIZEM CD) 180 MG 24 hr capsule TAKE 1 CAPSULE BY MOUTH EVERY DAY  . esomeprazole (NEXIUM) 20 MG capsule Take 20 mg by mouth 2 (two) times daily before a meal.   . ezetimibe (ZETIA) 10 MG tablet Take 1 tablet (10 mg total) by mouth daily.  . fluticasone (FLONASE) 50 MCG/ACT nasal spray SPRAY 2 SPRAYS INTO EACH NOSTRIL EVERY DAY (Patient taking differently: Place 1 spray into both nostrils daily as needed for allergies. )  . nitroGLYCERIN (NITROSTAT) 0.4 MG SL tablet Place 1 tablet (0.4 mg total) under the tongue every 5 (five) minutes as needed for chest pain. Maximum of 3 doses.  [name] PROAIR HFA 108 (90 Base) MCG/ACT inhaler INHALE 1 TO 2 PUFFS INTO THE LUNGS EVERY 6 HOURS AS NEEDED FOR WHEEZING OR SHORTNESS OF BREATH (Patient taking differently: Inhale 1-2 puffs into the lungs every 6 (six) hours as needed for wheezing or shortness of breath. )  . ranolazine (RANEXA) 500 MG 12 hr tablet Take 1 tablet (500 mg total) by mouth 2 (two) times daily.  . Rhubarb (ESTROVEN COMPLETE PO) Take 1 tablet by mouth daily.  . sertraline (ZOLOFT) 100 MG tablet TAKE 1 TABLET BY MOUTH EVERY DAY (PLZ SCHED VISIT WITH NEW PROVIDER FOR FUTURE FILLS)  . valACYclovir (VALTREX) 1000 MG tablet TAKE 1  TABLET BY MOUTH TWICE DAILY (Patient taking differently: Take 500 mg by mouth 2 (two) times daily as needed (fever blisters). )   No facility-administered encounter medications on file as of 09/12/2020.     ROS: Pertinent positives and negatives noted in HPI. Remainder of ROS non-contributory    Allergies  Allergen Reactions  . Boniva [Ibandronic Acid]     Joint Pain and Stiffness   . Fosamax [Alendronate Sodium] Other (See Comments)    GERD and joint pain  . Prolia [Denosumab] Other (See Comments)    Joint pain and in bed for 3 days    There were no vitals taken for this visit.   BP Readings from Last 3 Encounters:  07/13/20 122/72  07/12/20 122/78  06/07/20 140/90   Pulse Readings from Last 3 Encounters:  07/13/20 72  07/12/20 77  06/07/20 65   Wt Readings from Last 3 Encounters:  08/22/20 183 lb (83 kg)  07/13/20 183 lb (83 kg)  07/12/20 184 lb 4.9 oz (83.6 kg)    Physical Exam   A/P:     This visit occurred during the SARS-CoV-2 public health emergency.  Safety protocols were in place, including screening questions prior to the visit, additional usage of staff PPE, and extensive cleaning of exam room while observing appropriate contact time as indicated for disinfecting solutions.

## 2020-09-14 ENCOUNTER — Encounter (HOSPITAL_COMMUNITY): Payer: Self-pay

## 2020-09-14 DIAGNOSIS — Z955 Presence of coronary angioplasty implant and graft: Secondary | ICD-10-CM
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
## 2020-09-20 ENCOUNTER — Ambulatory Visit (INDEPENDENT_AMBULATORY_CARE_PROVIDER_SITE_OTHER): Payer: Medicare HMO | Admitting: Family Medicine

## 2020-09-20 ENCOUNTER — Encounter: Payer: Self-pay | Admitting: Family Medicine

## 2020-09-20 VITALS — BP 124/72 | HR 76 | Temp 97.8°F | Ht 62.0 in | Wt 186.6 lb

## 2020-09-20 DIAGNOSIS — Z1231 Encounter for screening mammogram for malignant neoplasm of breast: Secondary | ICD-10-CM | POA: Diagnosis not present

## 2020-09-20 DIAGNOSIS — B001 Herpesviral vesicular dermatitis: Secondary | ICD-10-CM

## 2020-09-20 DIAGNOSIS — E785 Hyperlipidemia, unspecified: Secondary | ICD-10-CM

## 2020-09-20 DIAGNOSIS — Z23 Encounter for immunization: Secondary | ICD-10-CM

## 2020-09-20 DIAGNOSIS — F339 Major depressive disorder, recurrent, unspecified: Secondary | ICD-10-CM | POA: Diagnosis not present

## 2020-09-20 DIAGNOSIS — K219 Gastro-esophageal reflux disease without esophagitis: Secondary | ICD-10-CM

## 2020-09-20 DIAGNOSIS — I10 Essential (primary) hypertension: Secondary | ICD-10-CM

## 2020-09-20 DIAGNOSIS — F411 Generalized anxiety disorder: Secondary | ICD-10-CM

## 2020-09-20 DIAGNOSIS — G8929 Other chronic pain: Secondary | ICD-10-CM

## 2020-09-20 DIAGNOSIS — R519 Headache, unspecified: Secondary | ICD-10-CM

## 2020-09-20 MED ORDER — VALACYCLOVIR HCL 1 G PO TABS: 500.0000 mg | ORAL_TABLET | Freq: Two times a day (BID) | ORAL | 1 refills | Status: DC

## 2020-09-20 MED ORDER — BENAZEPRIL-HYDROCHLOROTHIAZIDE 20-12.5 MG PO TABS: 1.0000 | ORAL_TABLET | Freq: Every day | ORAL | 3 refills | Status: DC

## 2020-09-20 MED ORDER — DIAZEPAM 5 MG PO TABS: 5.0000 mg | ORAL_TABLET | Freq: Three times a day (TID) | ORAL | 2 refills | Status: DC | PRN

## 2020-09-20 MED ORDER — ATORVASTATIN CALCIUM 80 MG PO TABS: 80.0000 mg | ORAL_TABLET | Freq: Every day | ORAL | 3 refills | Status: DC

## 2020-09-20 MED ORDER — BUTALBITAL-ASA-CAFF-CODEINE 50-325-40-30 MG PO CAPS: 1.0000 | ORAL_CAPSULE | ORAL | 2 refills | Status: DC | PRN

## 2020-09-20 MED ORDER — SERTRALINE HCL 100 MG PO TABS: 100.0000 mg | ORAL_TABLET | Freq: Every day | ORAL | 3 refills | Status: DC

## 2020-09-20 MED ORDER — ESOMEPRAZOLE MAGNESIUM 20 MG PO CPDR: 20.0000 mg | DELAYED_RELEASE_CAPSULE | Freq: Two times a day (BID) | ORAL | 3 refills | Status: DC

## 2020-09-20 NOTE — Progress Notes (Signed)
Diane [name] is a 69 y.o. female  Chief Complaint  Patient presents with  . Follow-up    med refills, declines Covid and wants Flu shot toda, also needs order for mammogram    HPI: Diane [name] is a 69 y.o. female who is formerly a patient of Dr. Deborra Medina and is seen today for f/u on chronic medical issues and medication refills. Pt endorses lots of stress - Mother passed away at beginning of 10-04-2020, significant other hospitalized d/t cancer and blood clots. Son with stage IV colon cancer, currently being treated. Pts PMHx is significant for CAD, HTN, anxiety, depression, chronic headaches.  Pt follows with cardio Dr. Harrell Gave End is s/p PTCA and stent placement in 05/2020. She completed cardiac rehab. She also follows with [facility].  Pt requests referral for mammogram. She would like flu vaccine today.  Past Medical History:  Diagnosis Date  . Arthritis   . CAD (coronary artery disease)    a. 09/2013 Cath: LM nl, LAD 30p, 30m, LCX nl, RCA 30p, EF 55-60%; b. 04/2017 MV: EF>65%, apical defect ->breast attenuation. Sm. area of isch cannot be excluded; c. 04/2020 Cor CTA: Ca2+ = 704 (96%). Sev RCA/LAD dzs; d. 05/2020 Cath/PCI: LM nl, LAD 27m, 36m/d, LCX 20p, RCA 80p (DFR 0.88-->2.5x12 Synergy XD DES).  . 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 Depression   . Diastolic dysfunction    a. 01/2013 Echo: EF 55-60%, no rwma, Gr1 DD, PASP 51mmHg; b. 04/2017 Echo: EF 65-70%, no rwma, Gr1 DD.  [name] GERD (gastroesophageal reflux disease)   . Hyperlipidemia   . Hypertension   . PONV (postoperative nausea and vomiting)   . Skin cancer of face 05/2015   basal cell  . Stroke [facility]) 2006   deaf in right ear    Past Surgical History:  Procedure Laterality Date  . BREAST BIOPSY Left 1984   abcess  . CARDIAC CATHETERIZATION    . CESAREAN SECTION    . CHOLECYSTECTOMY  1990  . CHONDROPLASTY  Left 01/05/2015   Procedure: CHONDROPLASTY;  Surgeon: Yvette Rack., MD;  Location: [place];  Service: Orthopedics;  Laterality: Left;  . CORONARY STENT INTERVENTION N/A 06/01/2020   Procedure: CORONARY STENT INTERVENTION;  Surgeon: Nelva Bush, MD;  Location: [place] CV LAB;  Service: Cardiovascular;  Laterality: N/A;  . INTRAVASCULAR PRESSURE WIRE/FFR STUDY N/A 06/01/2020   Procedure: INTRAVASCULAR PRESSURE WIRE/FFR STUDY;  Surgeon: Nelva Bush, MD;  Location: [place] CV LAB;  Service: Cardiovascular;  Laterality: N/A;  . KNEE ARTHROSCOPY WITH MEDIAL MENISECTOMY Left 01/05/2015   Procedure: LEFT KNEE ARTHROSCOPY WITH MEDIAL AND LATERAL MENISECTOMY; DEBRIDEMENT LATERAL PATELLA FEMORAL ;  Surgeon: Yvette Rack., MD;  Location: [place];  Service: Orthopedics;  Laterality: Left;  . LEFT HEART CATH AND CORONARY ANGIOGRAPHY N/A 06/01/2020   Procedure: LEFT HEART CATH AND CORONARY ANGIOGRAPHY;  Surgeon: Nelva Bush, MD;  Location: [place] CV [facility];  Service: Cardiovascular;  Laterality: N/A;  . LEFT HEART CATHETERIZATION WITH CORONARY ANGIOGRAM N/A 10/28/2013   Procedure: LEFT HEART CATHETERIZATION WITH CORONARY ANGIOGRAM;  Surgeon: Larey Dresser, MD;  Location: [facility] CATH LAB;  Service: Cardiovascular;  Laterality: N/A;  . TOTAL KNEE ARTHROPLASTY Left 07/06/2015   Procedure: TOTAL KNEE ARTHROPLASTY;  Surgeon: Earlie Server, MD;  Location: [place];  Service: Orthopedics;  Laterality:  Left;  . TUBAL LIGATION      Social History   Socioeconomic History  . Marital status: Significant Other    Spouse name: [name]  . Number of children: 8  . Years of education: 37  . Highest education level: Not on file  Occupational History  . Occupation: Retired  Tobacco Use  . Smoking status: Never Smoker  . Smokeless tobacco: Never Used  . Tobacco comment: LIVES WITH 2 SMOKERS   Vaping Use  . Vaping Use: Never used  Substance and Sexual Activity  .  Alcohol use: Yes    Comment: 1 glass of wine per month  . Drug use: No  . Sexual activity: Yes    Birth control/protection: Post-menopausal  Other Topics Concern  . Not on file  Social History Narrative   Divorced, but has fiancee.  Lives with fiancee in [place].  Moved here recently from [place], [place].     She 8 children- ages 31- 84.               Social Determinants of Health   Financial Resource Strain:   . Difficulty of Paying Living Expenses: Not on file  Food Insecurity:   . Worried About Charity fundraiser in the Last Year: Not on file  . Ran Out of Food in the Last Year: Not on file  Transportation Needs:   . Lack of Transportation (Medical): Not on file  . Lack of Transportation (Non-Medical): Not on file  Physical Activity:   . Days of Exercise per Week: Not on file  . Minutes of Exercise per Session: Not on file  Stress:   . Feeling of Stress : Not on file  Social Connections:   . Frequency of Communication with Friends and Family: Not on file  . Frequency of Social Gatherings with Friends and Family: Not on file  . Attends Religious Services: Not on file  . Active Member of Clubs or Organizations: Not on file  . Attends Archivist Meetings: Not on file  . Marital Status: Not on file  Intimate Partner Violence:   . Fear of Current or Ex-Partner: Not on file  . Emotionally Abused: Not on file  . Physically Abused: Not on file  . Sexually Abused: Not on file    Family History  Problem Relation Age of Onset  . Healthy Mother   . Hypertension Mother   . Stroke Mother   . Stroke Father   . Heart failure Father   . Heart disease Father        CABG  . Ovarian cancer Paternal Grandmother   . Liver cancer Paternal Grandfather   . Stroke Maternal Grandmother   . Heart failure Maternal Grandmother   . Heart failure Maternal Grandfather   . Juvenile Diabetes Son   . Healthy Son   . Healthy Son   . Hypertension Son   . Healthy Son   .  Hypertension Son   . Healthy Son   . Healthy Daughter   . Healthy Daughter   . Healthy Daughter   . Healthy Daughter   . Colon cancer Neg Hx   . Stomach cancer Neg Hx   . Rectal cancer Neg Hx   . Esophageal cancer Neg Hx   . Breast cancer Neg Hx      Immunization History  Administered Date(s) Administered  . Fluad Quad(high Dose 65+) 09/20/2020  . Influenza,inj,Quad PF,6+ Mos 02/27/2015, 09/08/2018  . Influenza-Unspecified 10/04/2015, 11/13/2016  .  Pneumococcal Conjugate-13 12/31/2017  . Pneumococcal Polysaccharide-23 01/06/2019  . Tdap 07/24/2014  . Zoster Recombinat (Shingrix) 04/21/2018, 08/05/2018    Outpatient Encounter Medications as of 09/20/2020  Medication Sig  . aspirin EC 81 MG tablet Take 1 tablet (81 mg total) by mouth daily.  [name] atorvastatin (LIPITOR) 80 MG tablet Take 1 tablet (80 mg total) by mouth daily.  . benazepril-hydrochlorthiazide (LOTENSIN HCT) 20-12.5 MG tablet Take 1 tablet by mouth daily.  . budesonide-formoterol (SYMBICORT) 80-4.5 MCG/ACT inhaler Inhale 2 puffs into the lungs 2 (two) times daily. (Patient taking differently: Inhale 2 puffs into the lungs 2 (two) times daily as needed (asthma). )  . butalbital-aspirin-caffeine-codeine (FIORINAL WITH CODEINE) 50-325-40-30 MG capsule Take 1-2 capsules by mouth every 4 (four) hours as needed for pain.  . cholecalciferol (VITAMIN D) 1000 UNITS tablet Take 1,000 Units by mouth daily.  . clopidogrel (PLAVIX) 75 MG tablet TAKE 1 TABLET BY MOUTH EVERY DAY  . diazepam (VALIUM) 5 MG tablet Take 1 tablet (5 mg total) by mouth every 8 (eight) hours as needed. for anxiety  . diltiazem (CARDIZEM CD) 180 MG 24 hr capsule TAKE 1 CAPSULE BY MOUTH EVERY DAY  . esomeprazole (NEXIUM) 20 MG capsule Take 1 capsule (20 mg total) by mouth 2 (two) times daily before a meal.  . ezetimibe (ZETIA) 10 MG tablet Take 1 tablet (10 mg total) by mouth daily.  . fluticasone (FLONASE) 50 MCG/ACT nasal spray SPRAY 2 SPRAYS INTO EACH NOSTRIL  EVERY DAY (Patient taking differently: Place 1 spray into both nostrils daily as needed for allergies. )  . nitroGLYCERIN (NITROSTAT) 0.4 MG SL tablet Place 1 tablet (0.4 mg total) under the tongue every 5 (five) minutes as needed for chest pain. Maximum of 3 doses.  [name] PROAIR HFA 108 (90 Base) MCG/ACT inhaler INHALE 1 TO 2 PUFFS INTO THE LUNGS EVERY 6 HOURS AS NEEDED FOR WHEEZING OR SHORTNESS OF BREATH (Patient taking differently: Inhale 1-2 puffs into the lungs every 6 (six) hours as needed for wheezing or shortness of breath. )  . ranolazine (RANEXA) 500 MG 12 hr tablet Take 1 tablet (500 mg total) by mouth 2 (two) times daily.  . Rhubarb (ESTROVEN COMPLETE PO) Take 1 tablet by mouth daily.  . sertraline (ZOLOFT) 100 MG tablet Take 1 tablet (100 mg total) by mouth daily.  . valACYclovir (VALTREX) 1000 MG tablet Take 0.5 tablets (500 mg total) by mouth 2 (two) times daily.  . [DISCONTINUED] atorvastatin (LIPITOR) 80 MG tablet Take 1 tablet (80 mg total) by mouth daily.  . [DISCONTINUED] benazepril-hydrochlorthiazide (LOTENSIN HCT) 20-12.5 MG tablet Take 1 tablet by mouth daily.  . [DISCONTINUED] butalbital-aspirin-caffeine-codeine (FIORINAL WITH CODEINE) 50-325-40-30 MG capsule Take 1-2 capsules by mouth every 4 (four) hours as needed for pain.  . [DISCONTINUED] diazepam (VALIUM) 5 MG tablet Take 1 tablet (5 mg total) by mouth every 6 (six) hours as needed. for anxiety  . [DISCONTINUED] esomeprazole (NEXIUM) 20 MG capsule Take 20 mg by mouth 2 (two) times daily before a meal.   . [DISCONTINUED] sertraline (ZOLOFT) 100 MG tablet TAKE 1 TABLET BY MOUTH EVERY DAY (PLZ SCHED VISIT WITH NEW PROVIDER FOR FUTURE FILLS)  . [DISCONTINUED] valACYclovir (VALTREX) 1000 MG tablet TAKE 1 TABLET BY MOUTH TWICE DAILY (Patient taking differently: Take 500 mg by mouth 2 (two) times daily as needed (fever blisters). )   No facility-administered encounter medications on file as of 09/20/2020.     ROS: Pertinent  positives and negatives noted in HPI. Remainder  of ROS non-contributory   Allergies  Allergen Reactions  . Boniva [Ibandronic Acid]     Joint Pain and Stiffness   . Fosamax [Alendronate Sodium] Other (See Comments)    GERD and joint pain  . Prolia [Denosumab] Other (See Comments)    Joint pain and in bed for 3 days    BP 124/72   Pulse 76   Temp 97.8 F (36.6 C) (Temporal)   Ht 5\' 2"  (1.575 m)   Wt 186 lb 9.6 oz (84.6 kg)   SpO2 97%   BMI 34.13 kg/m   Physical Exam Vitals reviewed.  Constitutional:      Appearance: She is not ill-appearing or toxic-appearing.  Cardiovascular:     Rate and Rhythm: Normal rate and regular rhythm.     Pulses: Normal pulses.  Pulmonary:     Effort: Pulmonary effort is normal. No respiratory distress.     Breath sounds: Normal breath sounds.  Musculoskeletal:     Right lower leg: No edema.     Left lower leg: No edema.  Neurological:     General: No focal deficit present.     Mental Status: She is alert and oriented to person, place, and time.  Psychiatric:        Mood and Affect: Mood normal.        Behavior: Behavior normal.      A/P:   1. Encounter for screening mammogram for malignant neoplasm of breast - MM DIGITAL SCREENING BILATERAL; Future  2. Generalized anxiety disorder 3. Depression, recurrent ([place]) - stable, pt does endorese lots of stress with mother's death, son with CRC, significant other with likely cancer (hospitalized currently) - database reviewed and appropriate Refill: - diazepam (VALIUM) 5 MG tablet; Take 1 tablet (5 mg total) by mouth every 8 (eight) hours as needed. for anxiety  Dispense: 90 tablet; Refill: 2 - sertraline (ZOLOFT) 100 MG tablet; Take 1 tablet (100 mg total) by mouth daily.  Dispense: 90 tablet; Refill: 3  4. Essential hypertension - controlled, at goal Refill: - benazepril-hydrochlorthiazide (LOTENSIN HCT) 20-12.5 MG tablet; Take 1 tablet by mouth daily.  Dispense: 90 tablet; Refill:  3 - cont cardizem  5. Gastroesophageal reflux disease, unspecified whether esophagitis present - controlled Refill: - esomeprazole (NEXIUM) 20 MG capsule; Take 1 capsule (20 mg total) by mouth 2 (two) times daily before a meal.  Dispense: 180 capsule; Refill: 3  6. Hyperlipidemia LDL goal <70 - last FLP in 06/2020 - zetia added in 06/2020 Refill: - atorvastatin (LIPITOR) 80 MG tablet; Take 1 tablet (80 mg total) by mouth daily.  Dispense: 90 tablet; Refill: 3  7. Recurrent cold sores - chronic, very intermittent, at baseline Refill: - valACYclovir (VALTREX) 1000 MG tablet; Take 0.5 tablets (500 mg total) by mouth 2 (two) times daily.  Dispense: 30 tablet; Refill: 1  8. Chronic nonintractable headache, unspecified headache type - chronic, stable Refill: - butalbital-aspirin-caffeine-codeine (FIORINAL WITH CODEINE) 50-325-40-30 MG capsule; Take 1-2 capsules by mouth every 4 (four) hours as needed for pain.  Dispense: 30 capsule; Refill: 2   This visit occurred during the SARS-CoV-2 public health emergency.  Safety protocols were in place, including screening questions prior to the visit, additional usage of staff PPE, and extensive cleaning of exam room while observing appropriate contact time as indicated for disinfecting solutions.

## 2020-10-10 ENCOUNTER — Ambulatory Visit
Admission: RE | Admit: 2020-10-10 | Discharge: 2020-10-10 | Disposition: A | Discharge status: Home / Self Care | Payer: Medicare HMO | Source: Ambulatory Visit | Attending: Family Medicine | Admitting: Family Medicine

## 2020-10-10 ENCOUNTER — Other Ambulatory Visit: Payer: Self-pay

## 2020-10-10 DIAGNOSIS — Z1231 Encounter for screening mammogram for malignant neoplasm of breast: Secondary | ICD-10-CM

## 2020-10-11 ENCOUNTER — Other Ambulatory Visit: Payer: Self-pay

## 2020-10-12 ENCOUNTER — Other Ambulatory Visit: Payer: Self-pay | Admitting: Internal Medicine

## 2020-10-12 ENCOUNTER — Other Ambulatory Visit: Payer: Self-pay | Admitting: Family Medicine

## 2020-10-12 ENCOUNTER — Ambulatory Visit (INDEPENDENT_AMBULATORY_CARE_PROVIDER_SITE_OTHER): Payer: Medicare HMO | Admitting: Family Medicine

## 2020-10-12 ENCOUNTER — Encounter: Payer: Self-pay | Admitting: Family Medicine

## 2020-10-12 VITALS — BP 136/74 | HR 78 | Temp 97.3°F | Ht 62.5 in | Wt 181.8 lb

## 2020-10-12 DIAGNOSIS — Z1211 Encounter for screening for malignant neoplasm of colon: Secondary | ICD-10-CM

## 2020-10-12 DIAGNOSIS — F4321 Adjustment disorder with depressed mood: Secondary | ICD-10-CM | POA: Diagnosis not present

## 2020-10-12 DIAGNOSIS — I5032 Chronic diastolic (congestive) heart failure: Secondary | ICD-10-CM

## 2020-10-12 DIAGNOSIS — Z86010 Personal history of colonic polyps: Secondary | ICD-10-CM

## 2020-10-12 DIAGNOSIS — I10 Essential (primary) hypertension: Secondary | ICD-10-CM | POA: Diagnosis not present

## 2020-10-12 DIAGNOSIS — M810 Age-related osteoporosis without current pathological fracture: Secondary | ICD-10-CM

## 2020-10-12 DIAGNOSIS — I25119 Atherosclerotic heart disease of native coronary artery with unspecified angina pectoris: Secondary | ICD-10-CM

## 2020-10-12 DIAGNOSIS — B001 Herpesviral vesicular dermatitis: Secondary | ICD-10-CM

## 2020-10-12 DIAGNOSIS — E785 Hyperlipidemia, unspecified: Secondary | ICD-10-CM | POA: Diagnosis not present

## 2020-10-12 MED ORDER — CLOPIDOGREL BISULFATE 75 MG PO TABS: 75.0000 mg | ORAL_TABLET | Freq: Every day | ORAL | 0 refills | Status: DC

## 2020-10-12 NOTE — Patient Instructions (Signed)
Due for colonoscopy Due for bone density mammo up to date

## 2020-10-12 NOTE — Progress Notes (Signed)
Diane [name] is a 69 y.o. female  Chief Complaint  Patient presents with  . Annual Exam    TOC- CPE/labs. fasting today.  having some anxiety(over dealth of significant other October 18, 2020)    HPI: Diane [name] is a 69 y.o. female seen today for annual exam, and f/u on chronic medical issues - HTN, HLD, GERD, osteoporosis.  She has cardio appt nect week.  Her partner, who was also my patient, passed away on October 18, 2020 from metastatic liver cancer so she is understandably upset/anxious and grieving this loss. Her appetite is decreased and she is not sleeping well. She has been offered grief counseling.   Last mammo: 10/10/20 Last Dexa: 07/2018 - osteoporosis - T-score = -2.5 - due Last colonoscopy: 01/2017 - due in 01/2020 - Dr. Fuller Plan ([facility])  Med refills needed today? none  She is UTD on labs.  Lab Results  Component Value Date   CHOL 166 07/13/2020   HDL 71 07/13/2020   LDLCALC 70 07/13/2020   LDLDIRECT 59 07/13/2020   TRIG 151 (H) 07/13/2020   CHOLHDL 2.3 07/13/2020   Lab Results  Component Value Date   ALT 23 07/13/2020   AST 18 07/13/2020   ALKPHOS 91 07/13/2020   BILITOT 0.4 07/13/2020   Lab Results  Component Value Date   CREATININE 0.75 07/13/2020   BUN 14 07/13/2020   NA 140 07/13/2020   K 4.3 07/13/2020   CL 99 07/13/2020   CO2 24 07/13/2020   Lab Results  Component Value Date   NA 140 07/13/2020   K 4.3 07/13/2020   CREATININE 0.75 07/13/2020   GFRNONAA 83 07/13/2020   GFRAA 95 07/13/2020   GLUCOSE 98 07/13/2020   Lab Results  Component Value Date   WBC 5.0 05/18/2020   HGB 15.0 05/18/2020   HCT 44.2 05/18/2020   MCV 97 05/18/2020   PLT 184 05/18/2020    Past Medical History:  Diagnosis Date  . Arthritis   . CAD (coronary artery disease)    a. 09/2013 Cath: LM nl, LAD 30p, 66m, LCX nl, RCA 30p, EF 55-60%; b. 04/2017 MV: EF>65%, apical defect ->breast attenuation. Sm. area of isch cannot be excluded; c. 04/2020 Cor CTA: Ca2+ = 704 (96%).  Sev RCA/LAD dzs; d. 05/2020 Cath/PCI: LM nl, LAD 71m, 53m/d, LCX 20p, RCA 80p (DFR 0.88-->2.5x12 Synergy XD DES).  . 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 Depression   . Diastolic dysfunction    a. 01/2013 Echo: EF 55-60%, no rwma, Gr1 DD, PASP 40mmHg; b. 04/2017 Echo: EF 65-70%, no rwma, Gr1 DD.  [name] GERD (gastroesophageal reflux disease)   . Hyperlipidemia   . Hypertension   . PONV (postoperative nausea and vomiting)   . Skin cancer of face 05/2015   basal cell  . Stroke [facility]) 2006   deaf in right ear    Past Surgical History:  Procedure Laterality Date  . BREAST BIOPSY Left 1984   abcess  . CARDIAC CATHETERIZATION    . CESAREAN SECTION    . CHOLECYSTECTOMY  1990  . CHONDROPLASTY Left 01/05/2015   Procedure: CHONDROPLASTY;  Surgeon: Yvette Rack., MD;  Location: [place];  Service: Orthopedics;  Laterality: Left;  . CORONARY STENT INTERVENTION N/A 06/01/2020   Procedure: CORONARY STENT INTERVENTION;  Surgeon: Nelva Bush, MD;  Location: [place] CV LAB;  Service: Cardiovascular;  Laterality: N/A;  . INTRAVASCULAR PRESSURE WIRE/FFR STUDY N/A 06/01/2020   Procedure: INTRAVASCULAR PRESSURE WIRE/FFR STUDY;  Surgeon: Nelva Bush, MD;  Location: [place] CV LAB;  Service: Cardiovascular;  Laterality: N/A;  . KNEE ARTHROSCOPY WITH MEDIAL MENISECTOMY Left 01/05/2015   Procedure: LEFT KNEE ARTHROSCOPY WITH MEDIAL AND LATERAL MENISECTOMY; DEBRIDEMENT LATERAL PATELLA FEMORAL ;  Surgeon: Yvette Rack., MD;  Location: [place];  Service: Orthopedics;  Laterality: Left;  . LEFT HEART CATH AND CORONARY ANGIOGRAPHY N/A 06/01/2020   Procedure: LEFT HEART CATH AND CORONARY ANGIOGRAPHY;  Surgeon: Nelva Bush, MD;  Location: [place] CV LAB;  Service: Cardiovascular;  Laterality: N/A;  . LEFT HEART CATHETERIZATION WITH CORONARY ANGIOGRAM N/A 10/28/2013   Procedure: LEFT HEART  CATHETERIZATION WITH CORONARY ANGIOGRAM;  Surgeon: Larey Dresser, MD;  Location: [facility] CATH LAB;  Service: Cardiovascular;  Laterality: N/A;  . TOTAL KNEE ARTHROPLASTY Left 07/06/2015   Procedure: TOTAL KNEE ARTHROPLASTY;  Surgeon: Earlie Server, MD;  Location: [place];  Service: Orthopedics;  Laterality: Left;  . TUBAL LIGATION      Social History   Socioeconomic History  . Marital status: Significant Other    Spouse name: [name]  . Number of children: 8  . Years of education: 20  . Highest education level: Not on file  Occupational History  . Occupation: Retired  Tobacco Use  . Smoking status: Never Smoker  . Smokeless tobacco: Never Used  . Tobacco comment: LIVES WITH 2 SMOKERS   Vaping Use  . Vaping Use: Never used  Substance and Sexual Activity  . Alcohol use: Yes    Comment: 1 glass of wine per month  . Drug use: No  . Sexual activity: Yes    Birth control/protection: Post-menopausal  Other Topics Concern  . Not on file  Social History Narrative   Divorced, but has fiancee.  Lives with fiancee in [place].  Moved here recently from [place], [place].     She 8 children- ages 66- 87.               Social Determinants of Health   Financial Resource Strain:   . Difficulty of Paying Living Expenses: Not on file  Food Insecurity:   . Worried About Charity fundraiser in the Last Year: Not on file  . Ran Out of Food in the Last Year: Not on file  Transportation Needs:   . Lack of Transportation (Medical): Not on file  . Lack of Transportation (Non-Medical): Not on file  Physical Activity:   . Days of Exercise per Week: Not on file  . Minutes of Exercise per Session: Not on file  Stress:   . Feeling of Stress : Not on file  Social Connections:   . Frequency of Communication with Friends and Family: Not on file  . Frequency of Social Gatherings with Friends and Family: Not on file  . Attends Religious Services: Not on file  . Active Member of Clubs or  Organizations: Not on file  . Attends Archivist Meetings: Not on file  . Marital Status: Not on file  Intimate Partner Violence:   . Fear of Current or Ex-Partner: Not on file  . Emotionally Abused: Not on file  . Physically Abused: Not on file  . Sexually Abused: Not on file    Family History  Problem Relation Age of Onset  . Hypertension Mother   . Stroke Mother   . Breast cancer Mother   .  Stroke Father   . Heart failure Father   . Heart disease Father        CABG  . Ovarian cancer Paternal Grandmother   . Liver cancer Paternal Grandfather   . Stroke Maternal Grandmother   . Heart failure Maternal Grandmother   . Heart failure Maternal Grandfather   . Juvenile Diabetes Son   . Healthy Son   . Healthy Son   . Hypertension Son   . Healthy Son   . Hypertension Son   . Healthy Son   . Healthy Daughter   . Healthy Daughter   . Healthy Daughter   . Healthy Daughter   . Colon cancer Neg Hx   . Stomach cancer Neg Hx   . Rectal cancer Neg Hx   . Esophageal cancer Neg Hx      Immunization History  Administered Date(s) Administered  . Fluad Quad(high Dose 65+) 09/20/2020  . Influenza,inj,Quad PF,6+ Mos 02/27/2015, 09/08/2018  . Influenza-Unspecified 10/04/2015, 11/13/2016  . Pneumococcal Conjugate-13 12/31/2017  . Pneumococcal Polysaccharide-23 01/06/2019  . Tdap 07/24/2014  . Zoster Recombinat (Shingrix) 04/21/2018, 08/05/2018    Outpatient Encounter Medications as of 10/12/2020  Medication Sig  . aspirin EC 81 MG tablet Take 1 tablet (81 mg total) by mouth daily.  [name] atorvastatin (LIPITOR) 80 MG tablet Take 1 tablet (80 mg total) by mouth daily.  . benazepril-hydrochlorthiazide (LOTENSIN HCT) 20-12.5 MG tablet Take 1 tablet by mouth daily.  . budesonide-formoterol (SYMBICORT) 80-4.5 MCG/ACT inhaler Inhale 2 puffs into the lungs 2 (two) times daily. (Patient taking differently: Inhale 2 puffs into the lungs 2 (two) times daily as needed (asthma). )  .  butalbital-aspirin-caffeine-codeine (FIORINAL WITH CODEINE) 50-325-40-30 MG capsule Take 1-2 capsules by mouth every 4 (four) hours as needed for pain.  . cholecalciferol (VITAMIN D) 1000 UNITS tablet Take 1,000 Units by mouth daily.  . clopidogrel (PLAVIX) 75 MG tablet TAKE 1 TABLET BY MOUTH EVERY DAY  . diazepam (VALIUM) 5 MG tablet Take 1 tablet (5 mg total) by mouth every 8 (eight) hours as needed. for anxiety  . diltiazem (CARDIZEM CD) 180 MG 24 hr capsule TAKE 1 CAPSULE BY MOUTH EVERY DAY  . esomeprazole (NEXIUM) 20 MG capsule Take 1 capsule (20 mg total) by mouth 2 (two) times daily before a meal.  . ezetimibe (ZETIA) 10 MG tablet Take 1 tablet (10 mg total) by mouth daily.  . fluticasone (FLONASE) 50 MCG/ACT nasal spray SPRAY 2 SPRAYS INTO EACH NOSTRIL EVERY DAY (Patient taking differently: Place 1 spray into both nostrils daily as needed for allergies. )  . nitroGLYCERIN (NITROSTAT) 0.4 MG SL tablet Place 1 tablet (0.4 mg total) under the tongue every 5 (five) minutes as needed for chest pain. Maximum of 3 doses.  [name] PROAIR HFA 108 (90 Base) MCG/ACT inhaler INHALE 1 TO 2 PUFFS INTO THE LUNGS EVERY 6 HOURS AS NEEDED FOR WHEEZING OR SHORTNESS OF BREATH (Patient taking differently: Inhale 1-2 puffs into the lungs every 6 (six) hours as needed for wheezing or shortness of breath. )  . ranolazine (RANEXA) 500 MG 12 hr tablet Take 1 tablet (500 mg total) by mouth 2 (two) times daily.  . Rhubarb (ESTROVEN COMPLETE PO) Take 1 tablet by mouth daily.  . sertraline (ZOLOFT) 100 MG tablet Take 1 tablet (100 mg total) by mouth daily.  . valACYclovir (VALTREX) 1000 MG tablet Take 0.5 tablets (500 mg total) by mouth 2 (two) times daily.   No facility-administered encounter medications on file as of  10/12/2020.     ROS: Gen: no fever, chills  Skin: no rash, itching ENT: no ear pain, ear drainage, nasal congestion, rhinorrhea, sinus pressure, sore throat Eyes: no blurry vision, double vision Resp: no  cough, wheeze,SOB CV: no CP, palpitations, LE edema,  GI: no heartburn, n/v/d/c, abd pain GU: no dysuria, urgency, frequency, hematuria MSK: no joint pain, myalgias, back pain Neuro: no dizziness, headache, weakness Psych: as per HPI   Allergies  Allergen Reactions  . Boniva [Ibandronic Acid]     Joint Pain and Stiffness   . Fosamax [Alendronate Sodium] Other (See Comments)    GERD and joint pain  . Prolia [Denosumab] Other (See Comments)    Joint pain and in bed for 3 days    BP 136/74   Pulse 78   Temp (!) 97.3 F (36.3 C) (Temporal)   Ht 5' 2.5" (1.588 m)   Wt 181 lb 12.8 oz (82.5 kg)   SpO2 96%   BMI 32.72 kg/m   Physical Exam Constitutional:      General: She is not in acute distress.    Appearance: She is well-developed.  HENT:     Head: Normocephalic and atraumatic.     Right Ear: Tympanic membrane and ear canal normal.     Left Ear: Tympanic membrane and ear canal normal.     Nose: Nose normal.  Eyes:     Conjunctiva/sclera: Conjunctivae normal.     Pupils: Pupils are equal, round, and reactive to light.  Neck:     Thyroid: No thyromegaly.  Cardiovascular:     Rate and Rhythm: Normal rate and regular rhythm.     Heart sounds: Normal heart sounds. No murmur heard.   Pulmonary:     Effort: Pulmonary effort is normal. No respiratory distress.     Breath sounds: Normal breath sounds. No wheezing or rhonchi.  Abdominal:     General: Bowel sounds are normal. There is no distension.     Palpations: Abdomen is soft. There is no mass.     Tenderness: There is no abdominal tenderness.  Musculoskeletal:     Cervical back: Neck supple.     Right lower leg: No edema.     Left lower leg: No edema.  Lymphadenopathy:     Cervical: No cervical adenopathy.  Skin:    General: Skin is warm and dry.     Findings: Bruising (left anterior shin from fall on stairs) present.  Neurological:     Mental Status: She is alert and oriented to person, place, and time.      Motor: No abnormal muscle tone.     Coordination: Coordination normal.  Psychiatric:        Behavior: Behavior normal.     A/P:   1. Grief reaction - long term partner passed away on 09-29-20 after recent dx of metastatic liver cancer - pt has support of father, children - she is looking into grief counseling as provided from home hospice in [place] sertraline 100mg  daily - she has previous Rx for valium 5mg  and has taken 3 times in the past 2 wks. She wonders if she can take qHS to help with sleep and I expressed that I think this is fine under her current circumstance. She does not need a refill at this time  2. Hyperlipidemia LDL goal <70 - at goal on FLP in 06/2020 - cont lipitor 80mg  daily, zetia 10mg  daily  3. Essential hypertension - cont, at goal -  cont lotensin HCT 20-12.5mg  daily, cardizem 180mg  daily  4. Chronic diastolic heart failure (HCC) - stable - cont cardizem, lotensin hct at current dosages  5. Coronary artery disease involving native coronary artery of native heart with angina pectoris ([place]) - s/p stent in 05/2020 - has cardio f/u next week - cont renaxa, lipitor, cardizem, zetia Refill: - clopidogrel (PLAVIX) 75 MG tablet; Take 1 tablet (75 mg total) by mouth daily.  Dispense: 30 tablet; Refill: 0  6. Age-related osteoporosis without current pathological fracture - T-score = -2.5 in 07/2018 - taking Vit D 1000IU daily - DG Bone Density; Future  7. Screening for colon cancer 8. Personal history of colonic polyps - was due in 01/2020 for colonoscopy - Ambulatory referral to Gastroenterology  I spent 40 min with the patient today obtaining HPI, discussing medical issues, reviewing labs from 06/2020 and 04/2020, and discussing plan of care.  This visit occurred during the SARS-CoV-2 public health emergency.  Safety protocols were in place, including screening questions prior to the visit, additional usage of staff PPE, and extensive cleaning of  exam room while observing appropriate contact time as indicated for disinfecting solutions.

## 2020-10-12 NOTE — Telephone Encounter (Signed)
Patient calling  States that [facility] in [place] says that they have not received refill request  Please resend - patient completely out of Plavix

## 2020-10-15 ENCOUNTER — Ambulatory Visit: Payer: Medicare HMO | Admitting: Family

## 2020-10-15 ENCOUNTER — Other Ambulatory Visit: Payer: Self-pay

## 2020-10-15 ENCOUNTER — Encounter: Payer: Self-pay | Admitting: Family

## 2020-10-15 VITALS — BP 130/80 | HR 76 | Ht 62.5 in | Wt 182.0 lb

## 2020-10-15 DIAGNOSIS — E785 Hyperlipidemia, unspecified: Secondary | ICD-10-CM | POA: Diagnosis not present

## 2020-10-15 DIAGNOSIS — I10 Essential (primary) hypertension: Secondary | ICD-10-CM

## 2020-10-15 DIAGNOSIS — I25118 Atherosclerotic heart disease of native coronary artery with other forms of angina pectoris: Secondary | ICD-10-CM | POA: Diagnosis not present

## 2020-10-15 DIAGNOSIS — I25119 Atherosclerotic heart disease of native coronary artery with unspecified angina pectoris: Secondary | ICD-10-CM

## 2020-10-15 DIAGNOSIS — B001 Herpesviral vesicular dermatitis: Secondary | ICD-10-CM

## 2020-10-15 MED ORDER — VALACYCLOVIR HCL 1 G PO TABS: 500.0000 mg | ORAL_TABLET | Freq: Two times a day (BID) | ORAL | 1 refills | Status: DC

## 2020-10-15 MED ORDER — CLOPIDOGREL BISULFATE 75 MG PO TABS: 75.0000 mg | ORAL_TABLET | Freq: Every day | ORAL | 3 refills | Status: DC

## 2020-10-15 NOTE — Progress Notes (Signed)
Office Visit    Patient Name: Diane [name] Date of Encounter: 10/15/2020  Primary Care Provider:  Shireen Quan, MD Primary Cardiologist:  Nelva Bush, MD Electrophysiologist:  None   Chief Complaint    Diane [name] is a 69 y.o. female with a hx of CAD, HTN, HLD, diastolic dysfunction, CVA, GERD presents today for follow up of CAD.  Past Medical History    Past Medical History:  Diagnosis Date  . Arthritis   . CAD (coronary artery disease)    a. 09/2013 Cath: LM nl, LAD 30p, 25m, LCX nl, RCA 30p, EF 55-60%; b. 04/2017 MV: EF>65%, apical defect ->breast attenuation. Sm. area of isch cannot be excluded; c. 04/2020 Cor CTA: Ca2+ = 704 (96%). Sev RCA/LAD dzs; d. 05/2020 Cath/PCI: LM nl, LAD 73m, 35m/d, LCX 20p, RCA 80p (DFR 0.88-->2.5x12 Synergy XD DES).  . 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 Depression   . Diastolic dysfunction    a. 01/2013 Echo: EF 55-60%, no rwma, Gr1 DD, PASP 17mmHg; b. 04/2017 Echo: EF 65-70%, no rwma, Gr1 DD.  [name] GERD (gastroesophageal reflux disease)   . Hyperlipidemia   . Hypertension   . PONV (postoperative nausea and vomiting)   . Skin cancer of face 05/2015   basal cell  . Stroke [facility]) 2006   deaf in right ear   Past Surgical History:  Procedure Laterality Date  . BREAST BIOPSY Left 1984   abcess  . CARDIAC CATHETERIZATION    . CESAREAN SECTION    . CHOLECYSTECTOMY  1990  . CHONDROPLASTY Left 01/05/2015   Procedure: CHONDROPLASTY;  Surgeon: Yvette Rack., MD;  Location: [place];  Service: Orthopedics;  Laterality: Left;  . CORONARY STENT INTERVENTION N/A 06/01/2020   Procedure: CORONARY STENT INTERVENTION;  Surgeon: Nelva Bush, MD;  Location: [place] CV LAB;  Service: Cardiovascular;  Laterality: N/A;  . INTRAVASCULAR PRESSURE WIRE/FFR STUDY N/A 06/01/2020   Procedure: INTRAVASCULAR PRESSURE WIRE/FFR STUDY;  Surgeon: Nelva Bush, MD;  Location: [place] CV LAB;  Service: Cardiovascular;  Laterality: N/A;  . KNEE ARTHROSCOPY WITH MEDIAL MENISECTOMY Left 01/05/2015   Procedure: LEFT KNEE ARTHROSCOPY WITH MEDIAL AND LATERAL MENISECTOMY; DEBRIDEMENT LATERAL PATELLA FEMORAL ;  Surgeon: Yvette Rack., MD;  Location: [place];  Service: Orthopedics;  Laterality: Left;  . LEFT HEART CATH AND CORONARY ANGIOGRAPHY N/A 06/01/2020   Procedure: LEFT HEART CATH AND CORONARY ANGIOGRAPHY;  Surgeon: Nelva Bush, MD;  Location: [place] CV LAB;  Service: Cardiovascular;  Laterality: N/A;  . LEFT HEART CATHETERIZATION WITH CORONARY ANGIOGRAM N/A 10/28/2013   Procedure: LEFT HEART CATHETERIZATION WITH CORONARY ANGIOGRAM;  Surgeon: Larey Dresser, MD;  Location: [facility] CATH LAB;  Service: Cardiovascular;  Laterality: N/A;  . TOTAL KNEE ARTHROPLASTY Left 07/06/2015   Procedure: TOTAL KNEE ARTHROPLASTY;  Surgeon: Earlie Server, MD;  Location: [place];  Service: Orthopedics;  Laterality: Left;  . TUBAL LIGATION      Allergies  Allergies  Allergen Reactions  . Boniva [Ibandronic Acid]     Joint Pain and Stiffness   . Fosamax [Alendronate Sodium] Other (See Comments)    GERD and joint pain  . Prolia [Denosumab] Other (See Comments)    Joint pain and in bed for 3 days    History of Present Illness    Diane [name] is a  69 y.o. female with a hx of CAD, HTN, HLD, diastolic dysfunction, CVA, GERD last seen 07/13/20.  Previous cardiac workup includes cardiac catheterization 09/2013 with moderate, nonobstructive CAD. Stress test May 2018 with breast attenuation though small area ischemia unable to excluded, EF 65%, Seen in clinic 01/2020 with chest discomfort and DOE. Coronary CTA 04/2020 with significant two-vessel CAD involving large LAD and codominant RCA. Diagnostic catheterization 06/01/2020 with 80% stenosis prox RCA with abnormal diastolic hyperemia free ratio (DFR) of 0.88. Otherwise nonobstructive  disease of LAD and circumflex. RCA trested with DES. Seen in clinic 06/07/20 for follow up feeling overall well, noted mildly elevated BP though home readings were good, and started on Zetia 10mg  daily. Seen in follow up 07/13/20 tolerating Zetia, her LDL on labs was 59.  She was doing well from a cardiac perspective and no changes were made.  Presents today for follow-up.  Unfortunately since her last visit both her mother and her partner have passed away.  Offered my condolences.  She plans to participate in grief counseling with hospice and her children have been very supportive.  She reports since her significant other's passing she has had intermittent sensations of chest heaviness at rest that she attributes this to her grief process.  She reports no exertional chest pain or dyspnea on exertion.  She continues to walk regularly without difficulty.    EKGs/Labs/Other Studies Reviewed:   The following studies were reviewed today:  LHC 06/01/20 Conclusions: 1. Severe single-vessel coronary artery disease with 80% proximal RCA stenosis that is hemodynamically significant by DFR (0.88). 2. Mild to moderate, non-obstructive coronary artery disease involving the LAD and LCx. 3. Hyperdynamic left ventricular systolic function with normal filling pressure. 4. Successful PCI to proximal RCA using Syergy 2.5 x 12 mm drug-eluting stent with 0% residual stenosis and TIMI-3 flow.   Recommendations: 1. Dual antiplatelet therapy with aspirin and clopidogrel for at least 6 months. 2. Aggressive secondary prevention. 3. Anticipate same-day discharge if no post-cath complications.  EKG:  No EKG today. EKG independently reviewed 06/07/20 NSR 76 bpm with left axis and no acute ST/T wave changes.   Recent Labs: 05/18/2020: Hemoglobin 15.0; Platelets 184 07/13/2020: ALT 23; BUN 14; Creatinine, Ser 0.75; Potassium 4.3; Sodium 140  Recent Lipid Panel    Component Value Date/Time   CHOL 166 07/13/2020 1216    TRIG 151 (H) 07/13/2020 1216   HDL 71 07/13/2020 1216   CHOLHDL 2.3 07/13/2020 1216   CHOLHDL 3 09/08/2018 0811   VLDL 30.8 09/08/2018 0811   LDLCALC 70 07/13/2020 1216   LDLDIRECT 59 07/13/2020 1216    Home Medications   No outpatient medications have been marked as taking for the 10/15/20 encounter (Office Visit) with Loel Dubonnet, NP.    Review of Systems   All other systems reviewed and are otherwise negative except as noted above.  Physical Exam    VS:  Ht 5' 2.5" (1.588 m)   BMI 32.72 kg/m  , BMI Body mass index is 32.72 kg/m. GEN: Well nourished, overweight, well developed, in no acute distress. HEENT: normal. Neck: Supple, no JVD, carotid bruits, or masses. Cardiac: RRR, no murmurs, rubs, or gallops. No clubbing, cyanosis, edema.  Radials/DP/PT 2+ and equal bilaterally.  Respiratory:  Respirations regular and unlabored, clear to auscultation bilaterally. GI: Soft, nontender, nondistended, BS + x 4. MS: No deformity or atrophy. Skin: Warm and dry, no rash. Neuro:  Strength and sensation are intact. Psych: Normal affect.  Assessment & Plan  1. CAD - S/p DES to RCA 06/01/20.  Recommended for DAPT for at least at least 6 months. Can consider discontinuation at follow up.  GDMT presently includes aspirin, Plavix, statin, Plavix, ranolazine, Zetia.  No anginal symptoms and no indication for ischemic evaluation at this time.  Continue low-sodium, heart healthy diet.  Continue regular cardiovascular exercise.  2. HTN - BP well controlled. Continue current antihypertensive regimen including benazepril-hydrochlorothiazide 20-12.5 mg daily, diltiazem 180 mg daily..   3. HLD, LDL goal <70 -07/13/2020 total cholesterol 166, HDL 71, LDL 59, triglycerides 151.  Continue Atorvastatin 80 mg daily, Zetia 10 mg daily.  4. Grief - Both her mother and significant other have passed away over the last 3 months.  Offered my condolences.  She plans to participate in grief counseling at  hospice.  We discussed that her intermittent sensation of chest heaviness is likely a grief response and the symptoms are very atypical for angina (occur at rest, self resolve within seconds to minutes, no dyspnea). Her chest discomfort is different than her previous anginal equivalent. No indication for ischemic evaluation at this time.  Disposition: Follow up in 4 month(s) with Dr. Saunders Revel or APP.   Loel Dubonnet, NP 10/15/2020, 11:32 AM

## 2020-10-15 NOTE — Patient Instructions (Addendum)
Medication Instructions:  No medication changes today.   *If you need a refill on your cardiac medications before your next appointment, please call your pharmacy*  Lab Work: No lab work today.   Testing/Procedures: None ordered today.    Follow-Up: At [facility], you and your health needs are our priority.  As part of our continuing mission to provide you with exceptional heart care, we have created designated Provider Care Teams.  These Care Teams include your primary Cardiologist (physician) and Advanced Practice Providers (APPs -  Physician Assistants and Nurse Practitioners) who all work together to provide you with the care you need, when you need it.  We recommend signing up for the patient portal called "MyChart".  Sign up information is provided on this After Visit Summary.  MyChart is used to connect with patients for Virtual Visits (Telemedicine).  Patients are able to view lab/test results, encounter notes, upcoming appointments, etc.  Non-urgent messages can be sent to your provider as well.   To learn more about what you can do with MyChart, go to [email or web address].    Your next appointment:   4 month(s)  The format for your next appointment:   In Person  Provider:   You may see Nelva Bush, MD or one of the following Advanced Practice Providers on your designated Care Team:    Murray Hodgkins, NP  Christell Faith, PA-C  Laurann [place], NP  Marrianne Mood, PA-C  Cadence Kathlen Mody, [place]

## 2020-10-15 NOTE — Telephone Encounter (Signed)
Contacted pharmacy to verify they didn't received RX then resent to pharmacy due to E-scribe error on 10/12/20.   Dm/cma

## 2020-10-16 ENCOUNTER — Other Ambulatory Visit: Payer: Self-pay | Admitting: Family Medicine

## 2020-10-16 DIAGNOSIS — R928 Other abnormal and inconclusive findings on diagnostic imaging of breast: Secondary | ICD-10-CM

## 2020-10-26 ENCOUNTER — Other Ambulatory Visit: Payer: Self-pay | Admitting: Family Medicine

## 2020-10-26 ENCOUNTER — Ambulatory Visit
Admission: RE | Admit: 2020-10-26 | Discharge: 2020-10-26 | Disposition: A | Discharge status: Home / Self Care | Payer: Medicare HMO | Source: Ambulatory Visit | Attending: Family Medicine | Admitting: Family Medicine

## 2020-10-26 ENCOUNTER — Other Ambulatory Visit: Payer: Self-pay

## 2020-10-26 DIAGNOSIS — R928 Other abnormal and inconclusive findings on diagnostic imaging of breast: Secondary | ICD-10-CM

## 2020-11-06 ENCOUNTER — Other Ambulatory Visit: Payer: Self-pay | Admitting: Family Medicine

## 2020-11-06 DIAGNOSIS — B001 Herpesviral vesicular dermatitis: Secondary | ICD-10-CM

## 2020-11-13 ENCOUNTER — Telehealth: Payer: Self-pay | Admitting: Family Medicine

## 2020-11-13 NOTE — Telephone Encounter (Signed)
Please see message and advise.  Thank you. Last OV 10/12/20 Last fill 09/20/20  #90/2

## 2020-11-13 NOTE — Telephone Encounter (Signed)
Left message on voicemail to call office.  

## 2020-11-13 NOTE — Telephone Encounter (Signed)
Patient is calling to speak to the nurse regarding her Valium. She has had to increase the dosage and will be running out soon. She states since she lost her mom and husband a month apart she's had trouble sleeping even after taking prescribed amount so she increased it. Please call her back at [phone] and advise.

## 2020-11-14 NOTE — Telephone Encounter (Signed)
I spoke with pt and she informed me that she is currently taking 3 Valium's QD, one in the am and two in the pm along with Tylenol PM.  Pt explains the she can only sleep when she takes the Tylenol PM and not just the Valium alone.  I scheduled pt for a VV, 1st available.  FYI

## 2020-11-14 NOTE — Telephone Encounter (Signed)
Pt was given 90 tabs with 2RF less than 2 mo ago. If she is out or running low on meds, please schedule VV so I can discuss pts understandable issues with sleep, grief and figure out a better medication option

## 2020-11-16 ENCOUNTER — Other Ambulatory Visit: Payer: Self-pay | Admitting: Family Medicine

## 2020-11-16 DIAGNOSIS — R519 Headache, unspecified: Secondary | ICD-10-CM

## 2020-11-16 DIAGNOSIS — G8929 Other chronic pain: Secondary | ICD-10-CM

## 2020-11-16 NOTE — Telephone Encounter (Signed)
Patient has upcoming appt on 11/28/20. Spoke to pharmacy and she has used up all the refills on her [employer]. Her last fill was on 10/20/20.   Please review and advise.  Thanks.  Dm/cma

## 2020-11-16 NOTE — Telephone Encounter (Signed)
Last OV 10/12/20 Last fill 09/20/20  330/2

## 2020-11-16 NOTE — Telephone Encounter (Signed)
Rx refilled for 30 tabs Please call pt to schedule VV to discuss headaches as I am concerned she is taking too much of this medication since she has gone thru 90 tabs in about 2 mo

## 2020-11-16 NOTE — Telephone Encounter (Signed)
She is already scheduled for a visit on 11/28/20.  Dm/cma

## 2020-11-28 ENCOUNTER — Telehealth (INDEPENDENT_AMBULATORY_CARE_PROVIDER_SITE_OTHER): Payer: Medicare HMO | Admitting: Family Medicine

## 2020-11-28 ENCOUNTER — Encounter: Payer: Self-pay | Admitting: Family Medicine

## 2020-11-28 VITALS — Ht 62.5 in

## 2020-11-28 DIAGNOSIS — G4700 Insomnia, unspecified: Secondary | ICD-10-CM | POA: Diagnosis not present

## 2020-11-28 DIAGNOSIS — I10 Essential (primary) hypertension: Secondary | ICD-10-CM | POA: Diagnosis not present

## 2020-11-28 DIAGNOSIS — F411 Generalized anxiety disorder: Secondary | ICD-10-CM

## 2020-11-28 MED ORDER — BENAZEPRIL-HYDROCHLOROTHIAZIDE 20-12.5 MG PO TABS: 1.0000 | ORAL_TABLET | Freq: Every day | ORAL | 3 refills | Status: DC

## 2020-11-28 MED ORDER — DIAZEPAM 5 MG PO TABS: 5.0000 mg | ORAL_TABLET | Freq: Two times a day (BID) | ORAL | 2 refills | Status: DC | PRN

## 2020-11-28 NOTE — Progress Notes (Signed)
Virtual Visit via Video Note  I connected with Diane [name] on 11/28/20 at  4:00 PM EST by a video enabled telemedicine application and verified that I am speaking with the correct person using two identifiers. Location patient: home Location provider: work  Persons participating in the virtual visit: patient, provider  I discussed the limitations of evaluation and management by telemedicine and the availability of in person appointments. The patient expressed understanding and agreed to proceed.  Chief Complaint  Patient presents with  . Follow-up    Med discussion     HPI: Diane [name] is a 69 y.o. female who is seen today to discuss insomnia, anxiety. She was taking valium 5mg  1 tab in AM and 2 tabs qHS along with tylenol or advil PM. She is now taking just 1 5mg  valium qHS and maybe 1-2x/wk she takes 5mg  valium once during the day.  Pt's significant other recently passed away as did her mother, and pts son is undergoing treatment for colon cancer. She did grief counseing and says she is "doing better". She is feeling like she "can handle life better than I was". She is no longer having panic attacks and is able to calm herself down without medication. She does a pre-bedtime routine to calm herself.   She also needs a refill of her BP med.   Past Medical History:  Diagnosis Date  . Arthritis   . CAD (coronary artery disease)    a. 09/2013 Cath: LM nl, LAD 30p, 42m, LCX nl, RCA 30p, EF 55-60%; b. 04/2017 MV: EF>65%, apical defect ->breast attenuation. Sm. area of isch cannot be excluded; c. 04/2020 Cor CTA: Ca2+ = 704 (96%). Sev RCA/LAD dzs; d. 05/2020 Cath/PCI: LM nl, LAD 51m, 41m/d, LCX 20p, RCA 80p (DFR 0.88-->2.5x12 Synergy XD DES).  . 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 Depression   . Diastolic dysfunction    a. 01/2013 Echo: EF 55-60%, no rwma, Gr1 DD, PASP 62mmHg; b. 04/2017 Echo: EF  65-70%, no rwma, Gr1 DD.  [name] GERD (gastroesophageal reflux disease)   . Hyperlipidemia   . Hypertension   . PONV (postoperative nausea and vomiting)   . Skin cancer of face 05/2015   basal cell  . Stroke [facility]) 2006   deaf in right ear    Past Surgical History:  Procedure Laterality Date  . BREAST BIOPSY Left 1984   abcess  . CARDIAC CATHETERIZATION    . CESAREAN SECTION    . CHOLECYSTECTOMY  1990  . CHONDROPLASTY Left 01/05/2015   Procedure: CHONDROPLASTY;  Surgeon: Yvette Rack., MD;  Location: [facility];  Service: Orthopedics;  Laterality: Left;  . CORONARY STENT INTERVENTION N/A 06/01/2020   Procedure: CORONARY STENT INTERVENTION;  Surgeon: Nelva Bush, MD;  Location: [facility];  Service: Cardiovascular;  Laterality: N/A;  . INTRAVASCULAR PRESSURE WIRE/FFR STUDY N/A 06/01/2020   Procedure: INTRAVASCULAR PRESSURE WIRE/FFR STUDY;  Surgeon: Nelva Bush, MD;  Location: [place] [facility];  Service: Cardiovascular;  Laterality: N/A;  . KNEE ARTHROSCOPY WITH MEDIAL MENISECTOMY Left 01/05/2015   Procedure: LEFT KNEE ARTHROSCOPY WITH MEDIAL AND LATERAL MENISECTOMY; DEBRIDEMENT LATERAL PATELLA FEMORAL ;  Surgeon: Yvette Rack., MD;  Location: [facility];  Service: Orthopedics;  Laterality: Left;  . LEFT HEART CATH AND CORONARY ANGIOGRAPHY N/A 06/01/2020   Procedure: LEFT HEART CATH AND  CORONARY ANGIOGRAPHY;  Surgeon: Nelva Bush, MD;  Location: [facility];  Service: Cardiovascular;  Laterality: N/A;  . LEFT HEART CATHETERIZATION WITH CORONARY ANGIOGRAM N/A 10/28/2013   Procedure: LEFT HEART CATHETERIZATION WITH CORONARY ANGIOGRAM;  Surgeon: Larey Dresser, MD;  Location: [facility] C[facility];  Service: Cardiovascular;  Laterality: N/A;  . TOTAL KNEE ARTHROPLASTY Left 07/06/2015   Procedure: TOTAL KNEE ARTHROPLASTY;  Surgeon: Earlie Server, MD;  Location: [place];  Service: Orthopedics;  Laterality: Left;  . TUBAL LIGATION      Family History   Problem Relation Age of Onset  . Hypertension Mother   . Stroke Mother   . Breast cancer Mother   . Stroke Father   . Heart failure Father   . Heart disease Father        CABG  . Ovarian cancer Paternal Grandmother   . Liver cancer Paternal Grandfather   . Stroke Maternal Grandmother   . Heart failure Maternal Grandmother   . Heart failure Maternal Grandfather   . Juvenile Diabetes Son   . Healthy Son   . Healthy Son   . Hypertension Son   . Healthy Son   . Hypertension Son   . Healthy Son   . Healthy Daughter   . Healthy Daughter   . Healthy Daughter   . Healthy Daughter   . Colon cancer Neg Hx   . Stomach cancer Neg Hx   . Rectal cancer Neg Hx   . Esophageal cancer Neg Hx     Social History   Tobacco Use  . Smoking status: Never Smoker  . Smokeless tobacco: Never Used  . Tobacco comment: LIVES WITH 2 SMOKERS   Vaping Use  . Vaping Use: Never used  Substance Use Topics  . Alcohol use: Yes    Comment: 1 glass of wine per month  . Drug use: No     Current Outpatient Medications:  .  ASCOMP-CODEINE 50-325-40-30 MG capsule, TAKE 1-2 CAPSULES BY MOUTH EVERY 4 (FOUR) HOURS AS NEEDED FOR PAIN., Disp: 30 capsule, Rfl: 0 .  aspirin EC 81 MG tablet, Take 1 tablet (81 mg total) by mouth daily., Disp: 90 tablet, Rfl: 3 .  atorvastatin (LIPITOR) 80 MG tablet, Take 1 tablet (80 mg total) by mouth daily., Disp: 90 tablet, Rfl: 3 .  benazepril-hydrochlorthiazide (LOTENSIN HCT) 20-12.5 MG tablet, Take 1 tablet by mouth daily., Disp: 90 tablet, Rfl: 3 .  budesonide-formoterol (SYMBICORT) 80-4.5 MCG/ACT inhaler, Inhale 2 puffs into the lungs 2 (two) times daily. (Patient taking differently: Inhale 2 puffs into the lungs 2 (two) times daily as needed (asthma). ), Disp: 1 Inhaler, Rfl: 12 .  cholecalciferol (VITAMIN D) 1000 UNITS tablet, Take 1,000 Units by mouth daily., Disp: , Rfl:  .  clopidogrel (PLAVIX) 75 MG tablet, Take 1 tablet (75 mg total) by mouth daily., Disp: 30 tablet,  Rfl: 3 .  diazepam (VALIUM) 5 MG tablet, Take 1 tablet (5 mg total) by mouth every 8 (eight) hours as needed. for anxiety, Disp: 90 tablet, Rfl: 2 .  diltiazem (CARDIZEM CD) 180 MG 24 hr capsule, TAKE 1 CAPSULE BY MOUTH EVERY DAY, Disp: 90 capsule, Rfl: 3 .  esomeprazole (NEXIUM) 20 MG capsule, Take 1 capsule (20 mg total) by mouth 2 (two) times daily before a meal., Disp: 180 capsule, Rfl: 3 .  nitroGLYCERIN (NITROSTAT) 0.4 MG SL tablet, Place 1 tablet (0.4 mg total) under the tongue every 5 (five) minutes as needed for chest pain. Maximum of 3  doses., Disp: 25 tablet, Rfl: 1 .  PROAIR HFA 108 (90 Base) MCG/ACT inhaler, INHALE 1 TO 2 PUFFS INTO THE LUNGS EVERY 6 HOURS AS NEEDED FOR WHEEZING OR SHORTNESS OF BREATH (Patient taking differently: Inhale 1-2 puffs into the lungs every 6 (six) hours as needed for wheezing or shortness of breath. ), Disp: 8.5 g, Rfl: 1 .  ranolazine (RANEXA) 500 MG 12 hr tablet, TAKE 1 TABLET BY MOUTH TWICE A DAY, Disp: 180 tablet, Rfl: 3 .  Rhubarb (ESTROVEN COMPLETE PO), Take 1 tablet by mouth daily., Disp: , Rfl:  .  sertraline (ZOLOFT) 100 MG tablet, Take 1 tablet (100 mg total) by mouth daily., Disp: 90 tablet, Rfl: 3 .  valACYclovir (VALTREX) 1000 MG tablet, Take 0.5 tablets (500 mg total) by mouth 2 (two) times daily., Disp: 30 tablet, Rfl: 1 .  ezetimibe (ZETIA) 10 MG tablet, Take 1 tablet (10 mg total) by mouth daily., Disp: 7 tablet, Rfl: 0 .  fluticasone (FLONASE) 50 MCG/ACT nasal spray, SPRAY 2 SPRAYS INTO EACH NOSTRIL EVERY DAY (Patient not taking: Reported on 11/28/2020), Disp: 16 mL, Rfl: 5  Allergies  Allergen Reactions  . Boniva [Ibandronic Acid]     Joint Pain and Stiffness   . Fosamax [Alendronate Sodium] Other (See Comments)    GERD and joint pain  . Prolia [Denosumab] Other (See Comments)    Joint pain and in bed for 3 days      ROS: See pertinent positives and negatives per HPI.   EXAM:  VITALS per patient if applicable: Ht 5' 2.5" (1.588  m)   BMI 32.76 kg/m    GENERAL: alert, oriented, appears well and in no acute distress  NECK: normal movements of the head and neck  LUNGS: on inspection no signs of respiratory distress, breathing rate appears normal, no obvious gross SOB, gasping or wheezing, no conversational dyspnea  CV: no obvious cyanosis  PSYCH/NEURO: pleasant and cooperative, speech and thought processing grossly intact   ASSESSMENT AND PLAN:  1. Essential hypertension - stable, controlled Refill: - benazepril-hydrochlorthiazide (LOTENSIN HCT) 20-12.5 MG tablet; Take 1 tablet by mouth daily.  Dispense: 90 tablet; Refill: 3  2. Generalized anxiety disorder 3. Insomnia, unspecified type - on zoloft 100mg  daily - pt is taking part in grief counseling and has found this very helpful Refill: - diazepam (VALIUM) 5 MG tablet; Take 1 tablet (5 mg total) by mouth every 12 (twelve) hours as needed (anxiety, sleep). for anxiety  Dispense: 60 tablet; Refill: 2 - f/u in 3 mo or sooner PRN    I discussed the assessment and treatment plan with the patient. The patient was provided an opportunity to ask questions and all were answered. The patient agreed with the plan and demonstrated an understanding of the instructions.   The patient was advised to call back or seek an in-person evaluation if the symptoms worsen or if the condition fails to improve as anticipated.   Letta Median, DO

## 2020-12-04 ENCOUNTER — Other Ambulatory Visit: Payer: Self-pay | Admitting: Family Medicine

## 2020-12-04 DIAGNOSIS — G8929 Other chronic pain: Secondary | ICD-10-CM

## 2020-12-04 NOTE — Telephone Encounter (Signed)
Received a refill for: ASCOMP-CODEINE 50-325-40-30 MG capsule LR  11/16/20, #30, 0 rfs LOV 11/28/20 FOV.  None scheduled.  Please advise.   Thanks. Dm/cma

## 2020-12-11 ENCOUNTER — Ambulatory Visit: Payer: Medicare HMO | Admitting: Gastroenterology

## 2020-12-15 ENCOUNTER — Other Ambulatory Visit: Payer: Self-pay | Admitting: Internal Medicine

## 2020-12-15 DIAGNOSIS — I25119 Atherosclerotic heart disease of native coronary artery with unspecified angina pectoris: Secondary | ICD-10-CM

## 2020-12-24 ENCOUNTER — Other Ambulatory Visit: Payer: Self-pay

## 2020-12-25 ENCOUNTER — Ambulatory Visit: Payer: Medicare HMO

## 2020-12-28 ENCOUNTER — Telehealth: Payer: Self-pay | Admitting: Family Medicine

## 2020-12-28 DIAGNOSIS — R519 Headache, unspecified: Secondary | ICD-10-CM

## 2020-12-28 DIAGNOSIS — G8929 Other chronic pain: Secondary | ICD-10-CM

## 2020-12-31 NOTE — Telephone Encounter (Signed)
Last VV 11/28/20 Last fill 12/05/20  #30/0

## 2021-01-02 MED ORDER — BUTALBITAL-APAP-CAFF-COD 50-325-40-30 MG PO CAPS
1.0000 | ORAL_CAPSULE | Freq: Four times a day (QID) | ORAL | 0 refills | Status: DC | PRN
Start: 2021-01-02 — End: 2021-02-06

## 2021-01-02 NOTE — Telephone Encounter (Signed)
[facility] advised pt the RX has zero refills but the notes say it can be refilled. They need a new RX to dispense. Pt is out of the headache medication since Thursday 12/27/2020.

## 2021-01-02 NOTE — Telephone Encounter (Signed)
Rx sent 

## 2021-01-02 NOTE — Addendum Note (Signed)
Addended by: Overton Mam on: 01/02/2021 05:10 PM   Modules accepted: Orders

## 2021-01-03 NOTE — Telephone Encounter (Signed)
Notified pt that RX was sent. She was appreciative and said she talked to the pharmacy and they will be ready to pick up today.

## 2021-01-08 ENCOUNTER — Telehealth: Payer: Self-pay

## 2021-01-08 ENCOUNTER — Ambulatory Visit (INDEPENDENT_AMBULATORY_CARE_PROVIDER_SITE_OTHER): Payer: Medicare Other

## 2021-01-08 ENCOUNTER — Other Ambulatory Visit: Payer: Self-pay

## 2021-01-08 VITALS — BP 118/70 | HR 66 | Temp 97.9°F | Resp 16 | Ht 62.5 in | Wt 185.0 lb

## 2021-01-08 DIAGNOSIS — Z0000 Encounter for general adult medical examination without abnormal findings: Secondary | ICD-10-CM | POA: Diagnosis not present

## 2021-01-08 MED ORDER — TRAZODONE HCL 50 MG PO TABS: 50.0000 mg | ORAL_TABLET | Freq: Every day | ORAL | 2 refills | Status: DC

## 2021-01-08 NOTE — Addendum Note (Signed)
Addended by: Ronnald Nian on: 01/08/2021 03:58 PM   Modules accepted: Orders

## 2021-01-08 NOTE — Telephone Encounter (Signed)
During Medicare Wellness visit, patient states she is still having trouble sleeping. She says the valium helps her calm down if she is feeling anxious but it does not help her sleep. She wants to know if she can try something else or does she need to be seen again?

## 2021-01-08 NOTE — Patient Instructions (Signed)
[name] , Thank you for taking time to come for your Medicare Wellness Visit. I appreciate your ongoing commitment to your health goals. Please review the following plan we discussed and let me know if I can assist you in the future.   Screening recommendations/referrals: Colonoscopy: Due-Per our conversation, you already have an appt with GI Mammogram: Completed 10/10/2020-Follow recommendation form provider regarding when to repeat. Bone Density: Scheduled for 04/10/2021 Recommended yearly ophthalmology/optometry visit for glaucoma screening and checkup Recommended yearly dental visit for hygiene and checkup  Vaccinations: Influenza vaccine: Up to date Pneumococcal vaccine: Completed vaccines Tdap vaccine: Up to date-Due-07/24/2024 Shingles vaccine: Completed vaccines   Covid-19:Declined  Advanced directives: Please bring a copy for your chart  Conditions/risks identified: See problem list  Next appointment: Follow up in one year for your annual wellness visit 01/14/2022 @ 10:30   Preventive Care 65 Years and Older, Female Preventive care refers to lifestyle choices and visits with your health care provider that can promote health and wellness. What does preventive care include?  A yearly physical exam. This is also called an annual well check.  Dental exams once or twice a year.  Routine eye exams. Ask your health care provider how often you should have your eyes checked.  Personal lifestyle choices, including:  Daily care of your teeth and gums.  Regular physical activity.  Eating a healthy diet.  Avoiding tobacco and drug use.  Limiting alcohol use.  Practicing safe sex.  Taking low-dose aspirin every day.  Taking vitamin and mineral supplements as recommended by your health care provider. What happens during an annual well check? The services and screenings done by your health care provider during your annual well check will depend on your age, overall  health, lifestyle risk factors, and family history of disease. Counseling  Your health care provider may ask you questions about your:  Alcohol use.  Tobacco use.  Drug use.  Emotional well-being.  Home and relationship well-being.  Sexual activity.  Eating habits.  History of falls.  Memory and ability to understand (cognition).  Work and work Statistician.  Reproductive health. Screening  You may have the following tests or measurements:  Height, weight, and BMI.  Blood pressure.  Lipid and cholesterol levels. These may be checked every 5 years, or more frequently if you are over 64 years old.  Skin check.  Lung cancer screening. You may have this screening every year starting at age 101 if you have a 30-pack-year history of smoking and currently smoke or have quit within the past 15 years.  Fecal occult blood test (FOBT) of the stool. You may have this test every year starting at age 69.  Flexible sigmoidoscopy or colonoscopy. You may have a sigmoidoscopy every 5 years or a colonoscopy every 10 years starting at age 33.  Hepatitis C blood test.  Hepatitis B blood test.  Sexually transmitted disease (STD) testing.  Diabetes screening. This is done by checking your blood sugar (glucose) after you have not eaten for a while (fasting). You may have this done every 1-3 years.  Bone density scan. This is done to screen for osteoporosis. You may have this done starting at age 65.  Mammogram. This may be done every 1-2 years. Talk to your health care provider about how often you should have regular mammograms. Talk with your health care provider about your test results, treatment options, and if necessary, the need for more tests. Vaccines  Your health care provider may recommend certain  vaccines, such as:  Influenza vaccine. This is recommended every year.  Tetanus, diphtheria, and acellular pertussis (Tdap, Td) vaccine. You may need a Td booster every 10  years.  Zoster vaccine. You may need this after age 5.  Pneumococcal 13-valent conjugate (PCV13) vaccine. One dose is recommended after age 76.  Pneumococcal polysaccharide (PPSV23) vaccine. One dose is recommended after age 75. Talk to your health care provider about which screenings and vaccines you need and how often you need them. This information is not intended to replace advice given to you by your health care provider. Make sure you discuss any questions you have with your health care provider. Document Released: 01/11/2016 Document Revised: 09/03/2016 Document Reviewed: 10/16/2015 Elsevier Interactive Patient Education  2017 [place] Prevention in the Home Falls can cause injuries. They can happen to people of all ages. There are many things you can do to make your home safe and to help prevent falls. What can I do on the outside of my home?  Regularly fix the edges of walkways and driveways and fix any cracks.  Remove anything that might make you trip as you walk through a door, such as a raised step or threshold.  Trim any bushes or trees on the path to your home.  Use bright outdoor lighting.  Clear any walking paths of anything that might make someone trip, such as rocks or tools.  Regularly check to see if handrails are loose or broken. Make sure that both sides of any steps have handrails.  Any raised decks and porches should have guardrails on the edges.  Have any leaves, snow, or ice cleared regularly.  Use sand or salt on walking paths during winter.  Clean up any spills in your garage right away. This includes oil or grease spills. What can I do in the bathroom?  Use night lights.  Install grab bars by the toilet and in the tub and shower. Do not use towel bars as grab bars.  Use non-skid mats or decals in the tub or shower.  If you need to sit down in the shower, use a plastic, non-slip stool.  Keep the floor dry. Clean up any water that  spills on the floor as soon as it happens.  Remove soap buildup in the tub or shower regularly.  Attach bath mats securely with double-sided non-slip rug tape.  Do not have throw rugs and other things on the floor that can make you trip. What can I do in the bedroom?  Use night lights.  Make sure that you have a light by your bed that is easy to reach.  Do not use any sheets or blankets that are too big for your bed. They should not hang down onto the floor.  Have a firm chair that has side arms. You can use this for support while you get dressed.  Do not have throw rugs and other things on the floor that can make you trip. What can I do in the kitchen?  Clean up any spills right away.  Avoid walking on wet floors.  Keep items that you use a lot in easy-to-reach places.  If you need to reach something above you, use a strong step stool that has a grab bar.  Keep electrical cords out of the way.  Do not use floor polish or wax that makes floors slippery. If you must use wax, use non-skid floor wax.  Do not have throw rugs and other things  on the floor that can make you trip. What can I do with my stairs?  Do not leave any items on the stairs.  Make sure that there are handrails on both sides of the stairs and use them. Fix handrails that are broken or loose. Make sure that handrails are as long as the stairways.  Check any carpeting to make sure that it is firmly attached to the stairs. Fix any carpet that is loose or worn.  Avoid having throw rugs at the top or bottom of the stairs. If you do have throw rugs, attach them to the floor with carpet tape.  Make sure that you have a light switch at the top of the stairs and the bottom of the stairs. If you do not have them, ask someone to add them for you. What else can I do to help prevent falls?  Wear shoes that:  Do not have high heels.  Have rubber bottoms.  Are comfortable and fit you well.  Are closed at the  toe. Do not wear sandals.  If you use a stepladder:  Make sure that it is fully opened. Do not climb a closed stepladder.  Make sure that both sides of the stepladder are locked into place.  Ask someone to hold it for you, if possible.  Clearly mark and make sure that you can see:  Any grab bars or handrails.  First and last steps.  Where the edge of each step is.  Use tools that help you move around (mobility aids) if they are needed. These include:  Canes.  Walkers.  Scooters.  Crutches.  Turn on the lights when you go into a dark area. Replace any light bulbs as soon as they burn out.  Set up your furniture so you have a clear path. Avoid moving your furniture around.  If any of your floors are uneven, fix them.  If there are any pets around you, be aware of where they are.  Review your medicines with your doctor. Some medicines can make you feel dizzy. This can increase your chance of falling. Ask your doctor what other things that you can do to help prevent falls. This information is not intended to replace advice given to you by your health care provider. Make sure you discuss any questions you have with your health care provider. Document Released: 10/11/2009 Document Revised: 05/22/2016 Document Reviewed: 01/19/2015 Elsevier Interactive Patient Education  2017 [employer].

## 2021-01-08 NOTE — Telephone Encounter (Signed)
I wil send Rx for trazodone 50mg  1 tab qHS to pharm. She should not take this at the same time as valium. Please make her aware. Thanks!

## 2021-01-08 NOTE — Telephone Encounter (Signed)
Patient notified & instructed not to take trazodone & valium at the same time. Patient voiced understanding.

## 2021-01-08 NOTE — Progress Notes (Signed)
Subjective:   Diane [name] is a 70 y.o. female who presents for Medicare Annual (Subsequent) preventive examination.  Review of Systems     Cardiac Risk Factors include: advanced age (>53men, >94 women);hypertension;dyslipidemia;obesity (BMI >30kg/m2)     Objective:    Today's Vitals   01/08/21 1035 01/08/21 1038  BP: 118/70   Pulse: 66   Resp: 16   Temp: 97.9 F (36.6 C)   TempSrc: Temporal   SpO2: 97%   Weight: 185 lb (83.9 kg)   Height: 5' 2.5" (1.588 m)   PainSc:  5    Body mass index is 33.3 kg/m.  Advanced Directives 01/08/2021 06/01/2020 12/25/2019 12/25/2019 10/12/2019 09/29/2018 03/27/2017  Does Patient Have a Medical Advance Directive? Yes No Yes No No No No  Type of Advance Directive Healthcare Power of [place] in Chart? No - copy requested - No - copy requested - - - -  Would patient like information on creating a medical advance directive? - No - Patient declined - Yes (ED - Information included in AVS) No - Patient declined Yes (MAU/Ambulatory/Procedural Areas - Information given) No - Patient declined  Pre-existing out of facility DNR order (yellow form or pink MOST form) - - - - - - -    Current Medications (verified) Outpatient Encounter Medications as of 01/08/2021  Medication Sig  . aspirin EC 81 MG tablet Take 1 tablet (81 mg total) by mouth daily.  [name] atorvastatin (LIPITOR) 80 MG tablet Take 1 tablet (80 mg total) by mouth daily.  . benazepril-hydrochlorthiazide (LOTENSIN HCT) 20-12.5 MG tablet Take 1 tablet by mouth daily.  . budesonide-formoterol (SYMBICORT) 80-4.5 MCG/ACT inhaler Inhale 2 puffs into the lungs 2 (two) times daily. (Patient taking differently: Inhale 2 puffs into the lungs 2 (two) times daily as needed (asthma).)  . butalbital-acetaminophen-caffeine (FIORICET WITH CODEINE) 50-325-40-30 MG capsule Take 1 capsule by mouth every 6 (six) hours as needed for  headache.  . cholecalciferol (VITAMIN D) 1000 UNITS tablet Take 1,000 Units by mouth daily.  . clopidogrel (PLAVIX) 75 MG tablet TAKE 1 TABLET BY MOUTH EVERY DAY  . diazepam (VALIUM) 5 MG tablet Take 1 tablet (5 mg total) by mouth every 12 (twelve) hours as needed (anxiety, sleep). for anxiety  . diltiazem (CARDIZEM CD) 180 MG 24 hr capsule TAKE 1 CAPSULE BY MOUTH EVERY DAY  . esomeprazole (NEXIUM) 20 MG capsule Take 1 capsule (20 mg total) by mouth 2 (two) times daily before a meal.  . nitroGLYCERIN (NITROSTAT) 0.4 MG SL tablet Place 1 tablet (0.4 mg total) under the tongue every 5 (five) minutes as needed for chest pain. Maximum of 3 doses.  [name] PROAIR HFA 108 (90 Base) MCG/ACT inhaler INHALE 1 TO 2 PUFFS INTO THE LUNGS EVERY 6 HOURS AS NEEDED FOR WHEEZING OR SHORTNESS OF BREATH (Patient taking differently: Inhale 1-2 puffs into the lungs every 6 (six) hours as needed for wheezing or shortness of breath.)  . ranolazine (RANEXA) 500 MG 12 hr tablet TAKE 1 TABLET BY MOUTH TWICE A DAY  . Rhubarb (ESTROVEN COMPLETE PO) Take 1 tablet by mouth daily.  . sertraline (ZOLOFT) 100 MG tablet Take 1 tablet (100 mg total) by mouth daily.  . valACYclovir (VALTREX) 1000 MG tablet Take 0.5 tablets (500 mg total) by mouth 2 (two) times daily.  [name] ezetimibe (ZETIA) 10 MG tablet Take 1 tablet (10 mg total) by mouth daily.  [name]  fluticasone (FLONASE) 50 MCG/ACT nasal spray SPRAY 2 SPRAYS INTO EACH NOSTRIL EVERY DAY (Patient not taking: No sig reported)   No facility-administered encounter medications on file as of 01/08/2021.    Allergies (verified) Boniva [ibandronic acid], Fosamax [alendronate sodium], and Prolia [denosumab]   History: Past Medical History:  Diagnosis Date  . Arthritis   . CAD (coronary artery disease)    a. 09/2013 Cath: LM nl, LAD 30p, 80m, LCX nl, RCA 30p, EF 55-60%; b. 04/2017 MV: EF>65%, apical defect ->breast attenuation. Sm. area of isch cannot be excluded; c. 04/2020 Cor CTA: Ca2+ = 704  (96%). Sev RCA/LAD dzs; d. 05/2020 Cath/PCI: LM nl, LAD 14m, 68m/d, LCX 20p, RCA 80p (DFR 0.88-->2.5x12 Synergy XD DES).  . 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 Depression   . Diastolic dysfunction    a. 01/2013 Echo: EF 55-60%, no rwma, Gr1 DD, PASP 28mmHg; b. 04/2017 Echo: EF 65-70%, no rwma, Gr1 DD.  [name] GERD (gastroesophageal reflux disease)   . Hyperlipidemia   . Hypertension   . PONV (postoperative nausea and vomiting)   . Skin cancer of face 05/2015   basal cell  . Stroke [facility]) 2006   deaf in right ear   Past Surgical History:  Procedure Laterality Date  . BREAST BIOPSY Left 1984   abcess  . CARDIAC CATHETERIZATION    . CESAREAN SECTION    . CHOLECYSTECTOMY  1990  . CHONDROPLASTY Left 01/05/2015   Procedure: CHONDROPLASTY;  Surgeon: Yvette Rack., MD;  Location: [place];  Service: Orthopedics;  Laterality: Left;  . CORONARY STENT INTERVENTION N/A 06/01/2020   Procedure: CORONARY STENT INTERVENTION;  Surgeon: Nelva Bush, MD;  Location: [place] CV LAB;  Service: Cardiovascular;  Laterality: N/A;  . INTRAVASCULAR PRESSURE WIRE/FFR STUDY N/A 06/01/2020   Procedure: INTRAVASCULAR PRESSURE WIRE/FFR STUDY;  Surgeon: Nelva Bush, MD;  Location: [place] CV LAB;  Service: Cardiovascular;  Laterality: N/A;  . KNEE ARTHROSCOPY WITH MEDIAL MENISECTOMY Left 01/05/2015   Procedure: LEFT KNEE ARTHROSCOPY WITH MEDIAL AND LATERAL MENISECTOMY; DEBRIDEMENT LATERAL PATELLA FEMORAL ;  Surgeon: Yvette Rack., MD;  Location: [place];  Service: Orthopedics;  Laterality: Left;  . LEFT HEART CATH AND CORONARY ANGIOGRAPHY N/A 06/01/2020   Procedure: LEFT HEART CATH AND CORONARY ANGIOGRAPHY;  Surgeon: Nelva Bush, MD;  Location: [place] CV LAB;  Service: Cardiovascular;  Laterality: N/A;  . LEFT HEART CATHETERIZATION WITH CORONARY ANGIOGRAM N/A 10/28/2013   Procedure: LEFT  HEART CATHETERIZATION WITH CORONARY ANGIOGRAM;  Surgeon: Larey Dresser, MD;  Location: [facility] C[facility] LAB;  Service: Cardiovascular;  Laterality: N/A;  . TOTAL KNEE ARTHROPLASTY Left 07/06/2015   Procedure: TOTAL KNEE ARTHROPLASTY;  Surgeon: Earlie Server, MD;  Location: [place];  Service: Orthopedics;  Laterality: Left;  . TUBAL LIGATION     Family History  Problem Relation Age of Onset  . Hypertension Mother   . Stroke Mother   . Breast cancer Mother   . Stroke Father   . Heart failure Father   . Heart disease Father        CABG  . Ovarian cancer Paternal Grandmother   . Liver cancer Paternal Grandfather   . Stroke Maternal Grandmother   . Heart failure Maternal Grandmother   . Heart failure Maternal Grandfather   . Juvenile Diabetes Son   . Healthy Son   .  Healthy Son   . Hypertension Son   . Healthy Son   . Hypertension Son   . Healthy Son   . Healthy Daughter   . Healthy Daughter   . Healthy Daughter   . Healthy Daughter   . Colon cancer Neg Hx   . Stomach cancer Neg Hx   . Rectal cancer Neg Hx   . Esophageal cancer Neg Hx    Social History   Socioeconomic History  . Marital status: Widowed    Spouse name: [name]  . Number of children: 8  . Years of education: 29  . Highest education level: Not on file  Occupational History  . Occupation: Retired  Tobacco Use  . Smoking status: Never Smoker  . Smokeless tobacco: Never Used  . Tobacco comment: LIVES WITH 2 SMOKERS   Vaping Use  . Vaping Use: Never used  Substance and Sexual Activity  . Alcohol use: Yes    Comment: 1 glass of wine per month  . Drug use: No  . Sexual activity: Yes    Birth control/protection: Post-menopausal  Other Topics Concern  . Not on file  Social History Narrative   Divorced, but has fiancee.  Lives with fiancee in [place].  Moved here recently from [place], [place].     She 8 children- ages 66- 17.               Social Determinants of Health   Financial Resource Strain:  Medium Risk  . Difficulty of Paying Living Expenses: Somewhat hard  Food Insecurity: Food Insecurity Present  . Worried About Charity fundraiser in the Last Year: Sometimes true  . Ran Out of Food in the Last Year: Never true  Transportation Needs: Unmet Transportation Needs  . Lack of Transportation (Medical): Yes  . Lack of Transportation (Non-Medical): No  Physical Activity: Insufficiently Active  . Days of Exercise per Week: 3 days  . Minutes of Exercise per Session: 30 min  Stress: Stress Concern Present  . Feeling of Stress : To some extent  Social Connections: Moderately Isolated  . Frequency of Communication with Friends and Family: More than three times a week  . Frequency of Social Gatherings with Friends and Family: More than three times a week  . Attends Religious Services: More than 4 times per year  . Active Member of Clubs or Organizations: No  . Attends Archivist Meetings: Never  . Marital Status: Widowed    Tobacco Counseling Counseling given: Not Answered Comment: LIVES WITH 2 SMOKERS    Clinical Intake:  Pre-visit preparation completed: Yes  Pain : 0-10 Pain Score: 5  Pain Type: Chronic pain Pain Location: Knee Pain Onset: More than a month ago Pain Frequency: Intermittent     Nutritional Status: BMI > 30  Obese Nutritional Risks: None Diabetes: No  How often do you need to have someone help you when you read instructions, pamphlets, or other written materials from your doctor or pharmacy?: 1 - Never  Diabetic?No  Interpreter Needed?: No  Information entered by :: Caroleen Hamman LPN   Activities of Daily Living In your present state of health, do you have any difficulty performing the following activities: 01/08/2021 06/01/2020  Hearing? N Y  Comment - deaf in right ear  Vision? N Y  Difficulty concentrating or making decisions? N N  Walking or climbing stairs? N N  Dressing or bathing? N N  Doing errands, shopping? N -   Preparing Food and eating ?  N -  Using the Toilet? N -  In the past six months, have you accidently leaked urine? N -  Do you have problems with loss of bowel control? N -  Managing your Medications? N -  Managing your Finances? N -  Housekeeping or managing your Housekeeping? N -  Some recent data might be hidden    Patient Care Team: Ronnald Nian, DO as PCP - General (Family Medicine) End, Harrell Gave, MD as PCP - Cardiology (Cardiology)  Indicate any recent Medical Services you may have received from other than [facility] providers in the past year (date may be approximate).     Assessment:   This is a routine wellness examination for [place].  Hearing/Vision screen  Hearing Screening   125Hz  250Hz  500Hz  1000Hz  2000Hz  3000Hz  4000Hz  6000Hz  8000Hz   Right ear:           Left ear:           Comments: Mild hearing loss  Vision Screening Comments: Reading glasses Last eye exam-2020-[facility]-Has an upcoming appt  Dietary issues and exercise activities discussed: Current Exercise Habits: Home exercise routine, Type of exercise: walking, Time (Minutes): 25, Frequency (Times/Week): 3, Weekly Exercise (Minutes/Week): 75, Intensity: Mild, Exercise limited by: None identified  Goals    . Exercise 150 min/wk Moderate Activity    . Patient Stated     Eat healthier, cut back on bread & fried foods      Depression Screen PHQ 2/9 Scores 01/08/2021 10/12/2020 07/12/2020 10/13/2019 10/12/2019 04/25/2019 09/29/2018  PHQ - 2 Score 1 4 0 0 0 1 0  PHQ- 9 Score - 17 - 6 - 14 -    Fall Risk Fall Risk  01/08/2021 10/12/2020 10/13/2019 10/12/2019 09/29/2018  Falls in the past year? 1 1 1  0 Yes  Number falls in past yr: 1 1 0 0 1  Injury with Fall? 1 1 0 0 No  Risk for fall due to : History of fall(s) - - - -  Follow up Falls prevention discussed - - - Education provided;Falls prevention discussed    FALL RISK PREVENTION PERTAINING TO THE HOME:  Any stairs in or around the home? No  Home  free of loose throw rugs in walkways, pet beds, electrical cords, etc? Yes  Adequate lighting in your home to reduce risk of falls? Yes   ASSISTIVE DEVICES UTILIZED TO PREVENT FALLS:  Life alert? No  Use of a cane, walker or w/c? No  Grab bars in the bathroom? Yes  Shower chair or bench in shower? Yes  Elevated toilet seat or a handicapped toilet? No   TIMED UP AND GO:  Was the test performed? Yes .  Length of time to ambulate 10 feet: 9 sec.   Gait steady and fast without use of assistive device  Cognitive Function:Normal cognitive status assessed by direct observation by this Nurse Health Advisor. No abnormalities found.          Immunizations Immunization History  Administered Date(s) Administered  . Fluad Quad(high Dose 65+) 09/20/2020  . Influenza,inj,Quad PF,6+ Mos 02/27/2015, 09/08/2018  . Influenza-Unspecified 10/04/2015, 11/13/2016  . Pneumococcal Conjugate-13 12/31/2017  . Pneumococcal Polysaccharide-23 01/06/2019  . Tdap 07/24/2014  . Zoster Recombinat (Shingrix) 04/21/2018, 08/05/2018    TDAP status: Up to date  Flu Vaccine status: Up to date  Pneumococcal vaccine status: Up to date  Covid-19 vaccine status: Declined, Education has been provided regarding the importance of this vaccine but patient still declined. Advised may receive this  vaccine at local pharmacy or Health Dept.or vaccine clinic. Aware to provide a copy of the vaccination record if obtained from local pharmacy or Health Dept. Verbalized acceptance and understanding.  Qualifies for Shingles Vaccine? No   Zostavax completed No   Shingrix Completed?: Yes  Screening Tests Health Maintenance  Topic Date Due  . COLONOSCOPY (Pts 45-41yrs Insurance coverage will need to be confirmed)  02/06/2020  . COVID-19 Vaccine (1) 01/24/2021 (Originally 09/04/1963)  . MAMMOGRAM  10/10/2021  . TETANUS/TDAP  07/24/2024  . INFLUENZA VACCINE  Completed  . DEXA SCAN  Completed  . Hepatitis C Screening   Completed  . PNA vac Low Risk Adult  Completed    Health Maintenance  Health Maintenance Due  Topic Date Due  . COLONOSCOPY (Pts 45-26yrs Insurance coverage will need to be confirmed)  02/06/2020    Colorectal cancer screening: Due-Patient states she has an upcoming appt with GI.  Mammogram status: Bilateral completed 10/10/2020-Abnormal To be repeated in 6 months  Bone Density status: Ordered Previosly. Pt provided with contact info and advised to call to schedule appt. Appt 04/10/2021   Lung Cancer Screening: (Low Dose CT Chest recommended if Age 33-80 years, 30 pack-year currently smoking OR have quit w/in 15years.) does not qualify.    Additional Screening:  Hepatitis C Screening:Completed 11/01/2015  Vision Screening: Recommended annual ophthalmology exams for early detection of glaucoma and other disorders of the eye. Is the patient up to date with their annual eye exam?  No  Who is the provider or what is the name of the office in which the patient attends annual eye exams? [facility] Patient states she has an upcoming appt  Dental Screening: Recommended annual dental exams for proper oral hygiene  Community Resource Referral / Chronic Care Management: CRR required this visit?  No   CCM required this visit?  No      Plan:     I have personally reviewed and noted the following in the patient's chart:   . Medical and social history . Use of alcohol, tobacco or illicit drugs  . Current medications and supplements . Functional ability and status . Nutritional status . Physical activity . Advanced directives . List of other physicians . Hospitalizations, surgeries, and ER visits in previous 12 months . Vitals . Screenings to include cognitive, depression, and falls . Referrals and appointments  In addition, I have reviewed and discussed with patient certain preventive protocols, quality metrics, and best practice recommendations. A written personalized care plan  for preventive services as well as general preventive health recommendations were provided to patient.     Marta Antu, LPN   04/14/4080   Nurse Notes: Patient states she is still having trouble sleeping. Message sent to PCP.

## 2021-01-11 ENCOUNTER — Telehealth: Payer: Self-pay | Admitting: Family Medicine

## 2021-01-11 NOTE — Telephone Encounter (Signed)
   Telephone encounter was:  Successful.  01/11/2021 Name: [name] MRN: [id number] DOB: 1951/07/26  Diane [name] is a 70 y.o. year old female who is a primary care patient of Ronnald Nian, DO . The community resource team was consulted for assistance with Food Insecurity, Transportation, Financial Assistance  Care guide performed the following interventions: Patient provided with information about care guide support team and interviewed to confirm resource needs Informed patient that Care Guide can send a referral for [place] since she has appointments at [facility]. Also informed patient that Care Guide will research food delivery services in the area and provide information for financial assistance. .  Follow Up Plan:  Care guide will follow up with patient by phone over the next week.   [place], Care Management Phone: [phone] Email: [email or web address]

## 2021-01-23 ENCOUNTER — Encounter: Payer: Self-pay | Admitting: Family Medicine

## 2021-01-23 NOTE — Telephone Encounter (Signed)
   Telephone encounter was:  Successful.  01/23/2021 Name: Diane [name] MRN: [id number] DOB: Jul 25, 1951  Diane [name] is a 70 y.o. year old female who is a primary care patient of Ronnald Nian, DO . The community resource team was consulted for assistance with Transportation Needs , Food Insecurity, Financial Difficulties related to utilities.  and Spoke with patient today regarding referral. Patient stated that she would like a resource letter to be sent to her with list of food pantires in her area and also any financial resources that can assist her in the future. Care Guide completed referral to [place]  for  [name]'s appointments on February 8th and 9th. Informed patient that she will receive a call from [facility] to verify and confirm both of her rides. Patient stated understanding.  Care guide performed the following interventions: Discussed resources to assist with food insecurity, transportation, and financial assistance. .  Follow Up Plan:  No further follow up planned at this time. The patient has been provided with needed resources. and Care Guide has sent letter to [facility] Caroleen Hamman to print and mail to patient. Once [facility] has confirmed both rides for [name] referral will be closed.   [place], Care Management Phone: [phone] Email: [email or web address]

## 2021-01-24 ENCOUNTER — Telehealth: Payer: Self-pay | Admitting: Gastroenterology

## 2021-01-24 NOTE — Telephone Encounter (Signed)
Hey Dr Fuller Plan, this pt is requesting to be seen by you again for a colonoscopy but it looks like the pt was seen by [facility] on 04/2020 for RUQ pain, would you agree to take this pt on as a patient again? Please advise on scheduling, thank you.

## 2021-01-24 NOTE — Telephone Encounter (Signed)
OK to return to [place] for colonoscopy and her ongoing GI care. Please advise her that for best medical care we strongly recommend she continues care with only one Gastroenterology group.

## 2021-01-28 ENCOUNTER — Telehealth: Payer: Self-pay

## 2021-01-28 ENCOUNTER — Telehealth: Payer: Self-pay | Admitting: Family Medicine

## 2021-01-28 NOTE — Telephone Encounter (Signed)
   Telephone encounter was:  Successful.  01/28/2021 Name: Diane [name] MRN: [id number] DOB: 1951/07/21  Diane [name] is a 70 y.o. year old female who is a primary care patient of Ronnald Nian, DO . The community resource team was consulted for assistance with Transportation Needs   Care guide performed the following interventions: Spoke with patient today regarding referral for transportation. Informed Ms. Lovan that Care Guide completed and sent referral to [facility]. Patient stated that she has not heard from the organziation yet, but her appointment for February 05, 2021 was rescheduled to March 05, 2021. Informed patietn that Care Guide will send changes to [facility] and that she will receive a call from the organization.  Paitent stated understanding. .  Follow Up Plan:  Care Guide will follow up with patient by phone over the next week.   [place], Care Management Phone: [phone] Email: [email or web address]

## 2021-01-28 NOTE — Telephone Encounter (Signed)
Manuel from [employer], calling to inform provider/nurse that he will be faxing a 2 pg form regarding the pt receiving a back brace.  The form needs to be signed and faxed back.

## 2021-01-29 NOTE — Telephone Encounter (Signed)
Form is on your desk to be signed.

## 2021-01-29 NOTE — Telephone Encounter (Signed)
   Telephone encounter was:  Successful.  01/29/2021 Name: Diane [name] MRN: [id number] DOB: 1951/01/15  Diane [name] is a 70 y.o. year old female who is a primary care patient of Ronnald Nian, DO . The community resource team was consulted for assistance with Transportation Needs   Care guide performed the following interventions: Spoke with patient today regarding referral for transportation.  Informed patient that Care Guide has spoken with [facility] and they have received the referral and she will be receiving a call today regarding scheduling rides for her appointments on 02/06/21 and 03/05/21. Patient stated understanding and has no addtional needs at this time. .  Follow Up Plan:  No further follow up planned at this time. The patient has been provided with needed resources.  [place], Care Management Phone: [phone] Email: [email or web address]

## 2021-01-30 NOTE — Telephone Encounter (Signed)
Form filled out and faxed back to [facility] products @ [phone]. Dm/cma

## 2021-01-30 NOTE — Telephone Encounter (Signed)
Signed and on your desk 

## 2021-01-31 ENCOUNTER — Other Ambulatory Visit: Payer: Self-pay | Admitting: Family Medicine

## 2021-01-31 ENCOUNTER — Telehealth: Payer: Self-pay | Admitting: Family Medicine

## 2021-01-31 DIAGNOSIS — G8929 Other chronic pain: Secondary | ICD-10-CM

## 2021-01-31 NOTE — Telephone Encounter (Signed)
[employer] calling about back brace order put in and that insurance required any notes or imaging that showed any limitation of motion for patient. [employer] Fax: [phone] Margreta Journey

## 2021-01-31 NOTE — Telephone Encounter (Signed)
Patient coming in for an OV tomorrow.   Dm/cma

## 2021-01-31 NOTE — Telephone Encounter (Signed)
Patient scheduled for a VV to discuss this to send notes to [employer]. Dm/cma

## 2021-02-01 NOTE — Telephone Encounter (Signed)
lft VM to rtn call to confirm medication. Dm/cma

## 2021-02-01 NOTE — Telephone Encounter (Signed)
Please confirm pt is taking this med. I refilled this last month and then pt said it was wrong Rx. She takes PRN butalbital-acetaminophen-caffeine (FIORICET WITH CODEINE) 50-325-40-30 MG capsule so the med requested for refill is similar and pt should not be taking both.

## 2021-02-01 NOTE — Telephone Encounter (Signed)
Refill request for:  Ascomp-Codeine 50-325-40-30  RX d/c 01/02/21  Please review and advise.   Thanks. Dm/cma

## 2021-02-02 ENCOUNTER — Other Ambulatory Visit: Payer: Self-pay | Admitting: Family Medicine

## 2021-02-04 NOTE — Telephone Encounter (Signed)
Please see message and advise.  Thank you. ° °

## 2021-02-04 NOTE — Telephone Encounter (Signed)
Last VV 11/28/20 Last fill 01/02/21  #30/0 Next OV 02/07/21

## 2021-02-05 ENCOUNTER — Ambulatory Visit: Payer: Medicare HMO | Admitting: Gastroenterology

## 2021-02-05 NOTE — Telephone Encounter (Signed)
I will wait for Dr. C to respond to this. She will be in office tomorrow

## 2021-02-06 ENCOUNTER — Other Ambulatory Visit: Payer: Self-pay

## 2021-02-06 ENCOUNTER — Encounter: Payer: Self-pay | Admitting: Internal Medicine

## 2021-02-06 ENCOUNTER — Ambulatory Visit: Payer: Medicare HMO | Admitting: Internal Medicine

## 2021-02-06 VITALS — BP 110/70 | HR 65 | Ht 62.0 in | Wt 186.1 lb

## 2021-02-06 DIAGNOSIS — I10 Essential (primary) hypertension: Secondary | ICD-10-CM

## 2021-02-06 DIAGNOSIS — E785 Hyperlipidemia, unspecified: Secondary | ICD-10-CM | POA: Diagnosis not present

## 2021-02-06 DIAGNOSIS — I25118 Atherosclerotic heart disease of native coronary artery with other forms of angina pectoris: Secondary | ICD-10-CM

## 2021-02-06 MED ORDER — BENAZEPRIL-HYDROCHLOROTHIAZIDE 20-12.5 MG PO TABS: 0.5000 | ORAL_TABLET | Freq: Every day | ORAL | 3 refills | Status: DC

## 2021-02-06 NOTE — Patient Instructions (Signed)
Medication Instructions:  Your physician has recommended you make the following change in your medication:   REDUCE Benazepril HCT to 1/2 tablet ( 10-6.25 mg) daily.  *If you need a refill on your cardiac medications before your next appointment, please call your pharmacy*   Lab Work: None ordered If you have labs (blood work) drawn today and your tests are completely normal, you will receive your results only by: [name] MyChart Message (if you have MyChart) OR . A paper copy in the mail If you have any lab test that is abnormal or we need to change your treatment, we will call you to review the results.   Testing/Procedures: None ordered   Follow-Up: At [facility], you and your health needs are our priority.  As part of our continuing mission to provide you with exceptional heart care, we have created designated Provider Care Teams.  These Care Teams include your primary Cardiologist (physician) and Advanced Practice Providers (APPs -  Physician Assistants and Nurse Practitioners) who all work together to provide you with the care you need, when you need it.  We recommend signing up for the patient portal called "MyChart".  Sign up information is provided on this After Visit Summary.  MyChart is used to connect with patients for Virtual Visits (Telemedicine).  Patients are able to view lab/test results, encounter notes, upcoming appointments, etc.  Non-urgent messages can be sent to your provider as well.   To learn more about what you can do with MyChart, go to [email or web address].    Your next appointment:   3 month(s)  The format for your next appointment:   In Person  Provider:   You may see Nelva Bush, MD or one of the following Advanced Practice Providers on your designated Care Team:    Murray Hodgkins, NP  Christell Faith, PA-C  Marrianne Mood, PA-C  Cadence [place], [place]  Laurann [place], NP    Other Instructions N/A

## 2021-02-06 NOTE — Telephone Encounter (Signed)
Please see message and advise.  Thank you. ° °

## 2021-02-06 NOTE — Progress Notes (Signed)
Follow-up Outpatient Visit Date: 02/06/2021  Primary Care Provider: Ronnald Nian, DO [place] [address]  Chief Complaint: Follow-up coronary artery disease  HPI:  [name] is a 70 y.o. female with history of CAD status post PCI to proximal RCA (02/3831), diastolic dysfunction, HTN, HLD, stroke, and GERD, who presents for follow-up of CAD and HFpEF.  She was last seen in our office in mid October by Laurann Montana, NP, at which time she was dealing with recent death of her mother and partner.  She reported occasional chest heaviness that she attributed to stress.  No medication changes or additional testing were pursued.  Today, Diane [name] reports that she has been feeling relatively well.  She notes occasional nonexertional chest pain that improves with belching and applying pressure to her sternum.  It usually happens after eating.  Pain is not reminiscent of what she experienced prior to PCI to the RCA last June.  She has not had any shortness of breath or palpitations but notes some orthostatic lightheadedness.  She has chronic left lower extremity edema and stable 2-3 pillow orthopnea.  She also notes some flank pain with recent diagnosis of kidney stones.  --------------------------------------------------------------------------------------------------  Past Medical History:  Diagnosis Date  . Arthritis   . CAD (coronary artery disease)    a. 09/2013 Cath: LM nl, LAD 30p, 34m, LCX nl, RCA 30p, EF 55-60%; b. 04/2017 MV: EF>65%, apical defect ->breast attenuation. Sm. area of isch cannot be excluded; c. 04/2020 Cor CTA: Ca2+ = 704 (96%). Sev RCA/LAD dzs; d. 05/2020 Cath/PCI: LM nl, LAD 31m, 16m/d, LCX 20p, RCA 80p (DFR 0.88-->2.5x12 Synergy XD DES).  . 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 Depression   . Diastolic dysfunction    a. 01/2013 Echo: EF 55-60%, no rwma, Gr1 DD,  PASP 27mmHg; b. 04/2017 Echo: EF 65-70%, no rwma, Gr1 DD.  [name] GERD (gastroesophageal reflux disease)   . Hyperlipidemia   . Hypertension   . PONV (postoperative nausea and vomiting)   . Skin cancer of face 05/2015   basal cell  . Stroke [facility]) 2006   deaf in right ear   Past Surgical History:  Procedure Laterality Date  . BREAST BIOPSY Left 1984   abcess  . CARDIAC CATHETERIZATION    . CESAREAN SECTION    . CHOLECYSTECTOMY  1990  . CHONDROPLASTY Left 01/05/2015   Procedure: CHONDROPLASTY;  Surgeon: Yvette Rack., MD;  Location: [place];  Service: Orthopedics;  Laterality: Left;  . CORONARY STENT INTERVENTION N/A 06/01/2020   Procedure: CORONARY STENT INTERVENTION;  Surgeon: Nelva Bush, MD;  Location: [place] CV LAB;  Service: Cardiovascular;  Laterality: N/A;  . INTRAVASCULAR PRESSURE WIRE/FFR STUDY N/A 06/01/2020   Procedure: INTRAVASCULAR PRESSURE WIRE/FFR STUDY;  Surgeon: Nelva Bush, MD;  Location: [place] CV LAB;  Service: Cardiovascular;  Laterality: N/A;  . KNEE ARTHROSCOPY WITH MEDIAL MENISECTOMY Left 01/05/2015   Procedure: LEFT KNEE ARTHROSCOPY WITH MEDIAL AND LATERAL MENISECTOMY; DEBRIDEMENT LATERAL PATELLA FEMORAL ;  Surgeon: Yvette Rack., MD;  Location: [place];  Service: Orthopedics;  Laterality: Left;  . LEFT HEART CATH AND CORONARY ANGIOGRAPHY N/A 06/01/2020   Procedure: LEFT HEART CATH AND CORONARY ANGIOGRAPHY;  Surgeon: Nelva Bush, MD;  Location: [place] CV LAB;  Service: Cardiovascular;  Laterality: N/A;  . LEFT HEART CATHETERIZATION  WITH CORONARY ANGIOGRAM N/A 10/28/2013   Procedure: LEFT HEART CATHETERIZATION WITH CORONARY ANGIOGRAM;  Surgeon: Larey Dresser, MD;  Location: [facility] CATH LAB;  Service: Cardiovascular;  Laterality: N/A;  . TOTAL KNEE ARTHROPLASTY Left 07/06/2015   Procedure: TOTAL KNEE ARTHROPLASTY;  Surgeon: Earlie Server, MD;  Location: [place];  Service: Orthopedics;  Laterality: Left;  . TUBAL  LIGATION      Current Meds  Medication Sig  . atorvastatin (LIPITOR) 80 MG tablet Take 1 tablet (80 mg total) by mouth daily.  . benazepril-hydrochlorthiazide (LOTENSIN HCT) 20-12.5 MG tablet Take 1 tablet by mouth daily.  . budesonide-formoterol (SYMBICORT) 80-4.5 MCG/ACT inhaler Inhale 2 puffs into the lungs 2 (two) times daily. (Patient taking differently: Inhale 2 puffs into the lungs 2 (two) times daily as needed (asthma).)  . butalbital-acetaminophen-caffeine (FIORICET WITH CODEINE) 50-325-40-30 MG capsule TAKE 1 CAPSULE BY MOUTH EVERY 6 (SIX) HOURS AS NEEDED FOR HEADACHE.  . cholecalciferol (VITAMIN D) 1000 UNITS tablet Take 1,000 Units by mouth daily.  . clopidogrel (PLAVIX) 75 MG tablet TAKE 1 TABLET BY MOUTH EVERY DAY  . diazepam (VALIUM) 5 MG tablet Take 1 tablet (5 mg total) by mouth every 12 (twelve) hours as needed (anxiety, sleep). for anxiety  . diltiazem (CARDIZEM CD) 180 MG 24 hr capsule TAKE 1 CAPSULE BY MOUTH EVERY DAY  . esomeprazole (NEXIUM) 20 MG capsule Take 1 capsule (20 mg total) by mouth 2 (two) times daily before a meal.  . ezetimibe (ZETIA) 10 MG tablet Take 10 mg by mouth daily.  . nitroGLYCERIN (NITROSTAT) 0.4 MG SL tablet Place 1 tablet (0.4 mg total) under the tongue every 5 (five) minutes as needed for chest pain. Maximum of 3 doses.  [name] PROAIR HFA 108 (90 Base) MCG/ACT inhaler INHALE 1 TO 2 PUFFS INTO THE LUNGS EVERY 6 HOURS AS NEEDED FOR WHEEZING OR SHORTNESS OF BREATH (Patient taking differently: Inhale 1-2 puffs into the lungs every 6 (six) hours as needed for wheezing or shortness of breath.)  . ranolazine (RANEXA) 500 MG 12 hr tablet TAKE 1 TABLET BY MOUTH TWICE A DAY  . Rhubarb (ESTROVEN COMPLETE PO) Take 1 tablet by mouth daily.  . sertraline (ZOLOFT) 100 MG tablet Take 1 tablet (100 mg total) by mouth daily.  . traZODone (DESYREL) 50 MG tablet TAKE 1 TABLET BY MOUTH EVERYDAY AT BEDTIME  . valACYclovir (VALTREX) 1000 MG tablet Take 0.5 tablets (500 mg  total) by mouth 2 (two) times daily.  . [DISCONTINUED] aspirin EC 81 MG tablet Take 1 tablet (81 mg total) by mouth daily.  . [DISCONTINUED] ezetimibe (ZETIA) 10 MG tablet Take 1 tablet (10 mg total) by mouth daily.    Allergies: Boniva [ibandronic acid], Fosamax [alendronate sodium], and Prolia [denosumab]  Social History   Tobacco Use  . Smoking status: Never Smoker  . Smokeless tobacco: Never Used  . Tobacco comment: LIVES WITH 2 SMOKERS   Vaping Use  . Vaping Use: Never used  Substance Use Topics  . Alcohol use: Yes    Comment: 1 glass of wine per month  . Drug use: No    Family History  Problem Relation Age of Onset  . Hypertension Mother   . Stroke Mother   . Breast cancer Mother   . Stroke Father   . Heart failure Father   . Heart disease Father        CABG  . Ovarian cancer Paternal Grandmother   . Liver cancer Paternal Grandfather   . Stroke Maternal  Grandmother   . Heart failure Maternal Grandmother   . Heart failure Maternal Grandfather   . Juvenile Diabetes Son   . Healthy Son   . Healthy Son   . Hypertension Son   . Healthy Son   . Hypertension Son   . Healthy Son   . Healthy Daughter   . Healthy Daughter   . Healthy Daughter   . Healthy Daughter   . Colon cancer Neg Hx   . Stomach cancer Neg Hx   . Rectal cancer Neg Hx   . Esophageal cancer Neg Hx     Review of Systems: A 12-system review of systems was performed and was negative except as noted in the HPI.  --------------------------------------------------------------------------------------------------  Physical Exam: BP 110/70 (BP Location: Left Arm, Patient Position: Sitting, Cuff Size: Normal)   Pulse 65   Ht 5\' 2"  (1.575 m)   Wt 186 lb 2 oz (84.4 kg)   BMI 34.04 kg/m   General:  NAD. Neck: No JVD or HJR. Lungs: Clear to auscultation bilaterally without wheezes or crackles. Heart: Regular rate and rhythm without murmurs, rubs, or gallops. Abdomen: Soft, nontender,  nondistended. Extremities: Trace pretibial edema.  EKG: Normal sinus rhythm without abnormality.  Lab Results  Component Value Date   WBC 5.0 05/18/2020   HGB 15.0 05/18/2020   HCT 44.2 05/18/2020   MCV 97 05/18/2020   PLT 184 05/18/2020    Lab Results  Component Value Date   NA 140 07/13/2020   K 4.3 07/13/2020   CL 99 07/13/2020   CO2 24 07/13/2020   BUN 14 07/13/2020   CREATININE 0.75 07/13/2020   GLUCOSE 98 07/13/2020   ALT 23 07/13/2020    Lab Results  Component Value Date   CHOL 166 07/13/2020   HDL 71 07/13/2020   LDLCALC 70 07/13/2020   LDLDIRECT 59 07/13/2020   TRIG 151 (H) 07/13/2020   CHOLHDL 2.3 07/13/2020    --------------------------------------------------------------------------------------------------  ASSESSMENT AND PLAN: Coronary artery disease with stable angina: Intermittent nonexertional chest pain, usually after eating, is most consistent with GI etiology.  Notably, chest pain is not reminiscent of what she experienced leading up to PCI to the RCA last year.  We will defer ischemia evaluation.  I have advised Diane [name] to mention her symptoms to her gastroenterologist, whom she is scheduled to see in the near future to arrange for colonoscopy.  We will plan to continue indefinite clopidogrel for secondary prevention, as well as antianginal therapy with diltiazem and ranolazine.  Should she need to undergo elective procedures, such as colonoscopy, I think it would be reasonable to temporarily discontinue clopidogrel and use aspirin 81 mg daily, if possible, during the periprocedural period.  Hypertension: Blood pressure low normal today with reports of intermittent orthostatic lightheadedness.  We will cut benazepril-HCTZ in half to 10-6.25 mg daily.  Continue current dose of diltiazem.  Hyperlipidemia: LDL well controlled with borderline elevated triglycerides on last check.  We will continue atorvastatin 80 mg daily and ezetimibe 10 mg daily.  I  encouraged Diane [name] to work on lifestyle modifications.  Follow-up: Return to clinic in 3 months.  Nelva Bush, MD 02/06/2021 2:58 PM

## 2021-02-07 ENCOUNTER — Encounter: Payer: Self-pay | Admitting: Family Medicine

## 2021-02-07 ENCOUNTER — Telehealth: Payer: Self-pay | Admitting: Family Medicine

## 2021-02-07 ENCOUNTER — Ambulatory Visit (INDEPENDENT_AMBULATORY_CARE_PROVIDER_SITE_OTHER): Payer: Medicare HMO | Admitting: Family Medicine

## 2021-02-07 ENCOUNTER — Encounter: Payer: Self-pay | Admitting: Internal Medicine

## 2021-02-07 VITALS — BP 124/78 | HR 70 | Temp 97.8°F | Ht 62.0 in | Wt 185.8 lb

## 2021-02-07 DIAGNOSIS — M545 Low back pain, unspecified: Secondary | ICD-10-CM

## 2021-02-07 DIAGNOSIS — M25552 Pain in left hip: Secondary | ICD-10-CM

## 2021-02-07 DIAGNOSIS — Z1159 Encounter for screening for other viral diseases: Secondary | ICD-10-CM

## 2021-02-07 DIAGNOSIS — M25551 Pain in right hip: Secondary | ICD-10-CM | POA: Diagnosis not present

## 2021-02-07 DIAGNOSIS — G8929 Other chronic pain: Secondary | ICD-10-CM | POA: Diagnosis not present

## 2021-02-07 NOTE — Telephone Encounter (Signed)
ERROR

## 2021-02-07 NOTE — Progress Notes (Signed)
Diane [name] is a 70 y.o. female  Chief Complaint  Patient presents with  . Follow-up    F/u to discuss getting back brace.     HPI: Diane [name] is a 70 y.o. female who is seen today to discuss need for back brace. She has chronic LBP with radiation to B/L hips. Typically occurs when she rides in a car for a while, stands for a while. She is also not able to lay directly on her hip due to pain. No radiation of pain and no LE numbness or tingling.  She had not worn a back brace previously.  She takes tylenol as needed but does not feel this is or has ever been effective. She also uses OTC topical like icy hot. She can't take NSAIDs d/t cardiac issues and on plavix.  She has tried exercises/stretches/PT.  She walks .78 miles per day.    Past Medical History:  Diagnosis Date  . Arthritis   . CAD (coronary artery disease)    a. 09/2013 Cath: LM nl, LAD 30p, 43m, LCX nl, RCA 30p, EF 55-60%; b. 04/2017 MV: EF>65%, apical defect ->breast attenuation. Sm. area of isch cannot be excluded; c. 04/2020 Cor CTA: Ca2+ = 704 (96%). Sev RCA/LAD dzs; d. 05/2020 Cath/PCI: LM nl, LAD 79m, 2m/d, LCX 20p, RCA 80p (DFR 0.88-->2.5x12 Synergy XD DES).  . 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 Depression   . Diastolic dysfunction    a. 01/2013 Echo: EF 55-60%, no rwma, Gr1 DD, PASP 10mmHg; b. 04/2017 Echo: EF 65-70%, no rwma, Gr1 DD.  [name] GERD (gastroesophageal reflux disease)   . Hyperlipidemia   . Hypertension   . PONV (postoperative nausea and vomiting)   . Skin cancer of face 05/2015   basal cell  . Stroke [facility]) 2006   deaf in right ear    Past Surgical History:  Procedure Laterality Date  . BREAST BIOPSY Left 1984   abcess  . CARDIAC CATHETERIZATION    . CESAREAN SECTION    . CHOLECYSTECTOMY  1990  . CHONDROPLASTY Left 01/05/2015   Procedure: CHONDROPLASTY;  Surgeon: Yvette Rack., MD;  Location:  [place];  Service: Orthopedics;  Laterality: Left;  . CORONARY STENT INTERVENTION N/A 06/01/2020   Procedure: CORONARY STENT INTERVENTION;  Surgeon: Nelva Bush, MD;  Location: [place] CV LAB;  Service: Cardiovascular;  Laterality: N/A;  . INTRAVASCULAR PRESSURE WIRE/FFR STUDY N/A 06/01/2020   Procedure: INTRAVASCULAR PRESSURE WIRE/FFR STUDY;  Surgeon: Nelva Bush, MD;  Location: [place] CV LAB;  Service: Cardiovascular;  Laterality: N/A;  . KNEE ARTHROSCOPY WITH MEDIAL MENISECTOMY Left 01/05/2015   Procedure: LEFT KNEE ARTHROSCOPY WITH MEDIAL AND LATERAL MENISECTOMY; DEBRIDEMENT LATERAL PATELLA FEMORAL ;  Surgeon: Yvette Rack., MD;  Location: [place];  Service: Orthopedics;  Laterality: Left;  . LEFT HEART CATH AND CORONARY ANGIOGRAPHY N/A 06/01/2020   Procedure: LEFT HEART CATH AND CORONARY ANGIOGRAPHY;  Surgeon: Nelva Bush, MD;  Location: [place] CV LAB;  Service: Cardiovascular;  Laterality: N/A;  . LEFT HEART CATHETERIZATION WITH CORONARY ANGIOGRAM N/A 10/28/2013   Procedure: LEFT HEART CATHETERIZATION WITH CORONARY ANGIOGRAM;  Surgeon: Larey Dresser, MD;  Location: [facility] CATH LAB;  Service: Cardiovascular;  Laterality: N/A;  . TOTAL KNEE ARTHROPLASTY Left 07/06/2015   Procedure: TOTAL KNEE ARTHROPLASTY;  Surgeon: Earlie Server, MD;  Location:  [place] OR;  Service: Orthopedics;  Laterality: Left;  . TUBAL LIGATION      Social History   Socioeconomic History  . Marital status: Widowed    Spouse name: [name]  . Number of children: 8  . Years of education: 45  . Highest education level: Not on file  Occupational History  . Occupation: Retired  Tobacco Use  . Smoking status: Never Smoker  . Smokeless tobacco: Never Used  . Tobacco comment: LIVES WITH 2 SMOKERS   Vaping Use  . Vaping Use: Never used  Substance and Sexual Activity  . Alcohol use: Yes    Comment: 1 glass of wine per month  . Drug use: No  . Sexual activity:  Yes    Birth control/protection: Post-menopausal  Other Topics Concern  . Not on file  Social History Narrative   Divorced, but has fiancee.  Lives with fiancee in [place].  Moved here recently from [place], [place].     She 8 children- ages 33- 36.               Social Determinants of Health   Financial Resource Strain: Medium Risk  . Difficulty of Paying Living Expenses: Somewhat hard  Food Insecurity: Food Insecurity Present  . Worried About Charity fundraiser in the Last Year: Sometimes true  . Ran Out of Food in the Last Year: Never true  Transportation Needs: Unmet Transportation Needs  . Lack of Transportation (Medical): Yes  . Lack of Transportation (Non-Medical): No  Physical Activity: Insufficiently Active  . Days of Exercise per Week: 3 days  . Minutes of Exercise per Session: 30 min  Stress: Stress Concern Present  . Feeling of Stress : To some extent  Social Connections: Moderately Isolated  . Frequency of Communication with Friends and Family: More than three times a week  . Frequency of Social Gatherings with Friends and Family: More than three times a week  . Attends Religious Services: More than 4 times per year  . Active Member of Clubs or Organizations: No  . Attends Archivist Meetings: Never  . Marital Status: Widowed  Intimate Partner Violence: Not At Risk  . Fear of Current or Ex-Partner: No  . Emotionally Abused: No  . Physically Abused: No  . Sexually Abused: No    Family History  Problem Relation Age of Onset  . Hypertension Mother   . Stroke Mother   . Breast cancer Mother   . Stroke Father   . Heart failure Father   . Heart disease Father        CABG  . Ovarian cancer Paternal Grandmother   . Liver cancer Paternal Grandfather   . Stroke Maternal Grandmother   . Heart failure Maternal Grandmother   . Heart failure Maternal Grandfather   . Juvenile Diabetes Son   . Healthy Son   . Healthy Son   . Hypertension Son   .  Healthy Son   . Hypertension Son   . Healthy Son   . Healthy Daughter   . Healthy Daughter   . Healthy Daughter   . Healthy Daughter   . Colon cancer Neg Hx   . Stomach cancer Neg Hx   . Rectal cancer Neg Hx   . Esophageal cancer Neg Hx      Immunization History  Administered Date(s) Administered  . Fluad Quad(high Dose 65+) 09/20/2020  . Influenza,inj,Quad PF,6+ Mos 02/27/2015, 09/08/2018  . Influenza-Unspecified 10/04/2015, 11/13/2016  . Pneumococcal Conjugate-13 12/31/2017  .  Pneumococcal Polysaccharide-23 01/06/2019  . Tdap 07/24/2014  . Zoster Recombinat (Shingrix) 04/21/2018, 08/05/2018    Outpatient Encounter Medications as of 02/07/2021  Medication Sig  . atorvastatin (LIPITOR) 80 MG tablet Take 1 tablet (80 mg total) by mouth daily.  . benazepril-hydrochlorthiazide (LOTENSIN HCT) 20-12.5 MG tablet Take 0.5 tablets by mouth daily.  . budesonide-formoterol (SYMBICORT) 80-4.5 MCG/ACT inhaler Inhale 2 puffs into the lungs 2 (two) times daily. (Patient taking differently: Inhale 2 puffs into the lungs 2 (two) times daily as needed (asthma).)  . butalbital-acetaminophen-caffeine (FIORICET WITH CODEINE) 50-325-40-30 MG capsule TAKE 1 CAPSULE BY MOUTH EVERY 6 (SIX) HOURS AS NEEDED FOR HEADACHE.  . cholecalciferol (VITAMIN D) 1000 UNITS tablet Take 1,000 Units by mouth daily.  . clopidogrel (PLAVIX) 75 MG tablet TAKE 1 TABLET BY MOUTH EVERY DAY  . diazepam (VALIUM) 5 MG tablet Take 1 tablet (5 mg total) by mouth every 12 (twelve) hours as needed (anxiety, sleep). for anxiety  . diltiazem (CARDIZEM CD) 180 MG 24 hr capsule TAKE 1 CAPSULE BY MOUTH EVERY DAY  . esomeprazole (NEXIUM) 20 MG capsule Take 1 capsule (20 mg total) by mouth 2 (two) times daily before a meal.  . ezetimibe (ZETIA) 10 MG tablet Take 10 mg by mouth daily.  . Multiple Minerals-Vitamins (CAL-MAG-ZINC-D) TABS Take by mouth.  . nitroGLYCERIN (NITROSTAT) 0.4 MG SL tablet Place 1 tablet (0.4 mg total) under the  tongue every 5 (five) minutes as needed for chest pain. Maximum of 3 doses.  [name] PROAIR HFA 108 (90 Base) MCG/ACT inhaler INHALE 1 TO 2 PUFFS INTO THE LUNGS EVERY 6 HOURS AS NEEDED FOR WHEEZING OR SHORTNESS OF BREATH (Patient taking differently: Inhale 1-2 puffs into the lungs every 6 (six) hours as needed for wheezing or shortness of breath.)  . ranolazine (RANEXA) 500 MG 12 hr tablet TAKE 1 TABLET BY MOUTH TWICE A DAY  . Rhubarb (ESTROVEN COMPLETE PO) Take 1 tablet by mouth daily.  . sertraline (ZOLOFT) 100 MG tablet Take 1 tablet (100 mg total) by mouth daily.  . traZODone (DESYREL) 50 MG tablet TAKE 1 TABLET BY MOUTH EVERYDAY AT BEDTIME  . valACYclovir (VALTREX) 1000 MG tablet Take 0.5 tablets (500 mg total) by mouth 2 (two) times daily.   No facility-administered encounter medications on file as of 02/07/2021.     ROS: Pertinent positives and negatives noted in HPI. Remainder of ROS non-contributory    Allergies  Allergen Reactions  . Boniva [Ibandronic Acid]     Joint Pain and Stiffness   . Fosamax [Alendronate Sodium] Other (See Comments)    GERD and joint pain  . Prolia [Denosumab] Other (See Comments)    Joint pain and in bed for 3 days    BP 124/78   Pulse 70   Temp 97.8 F (36.6 C) (Temporal)   Ht 5\' 2"  (1.575 m)   Wt 185 lb 12.8 oz (84.3 kg)   SpO2 95%   BMI 33.98 kg/m    Wt Readings from Last 3 Encounters:  02/07/21 185 lb 12.8 oz (84.3 kg)  02/06/21 186 lb 2 oz (84.4 kg)  01/08/21 185 lb (83.9 kg)   Temp Readings from Last 3 Encounters:  02/07/21 97.8 F (36.6 C) (Temporal)  01/08/21 97.9 F (36.6 C) (Temporal)  10/12/20 (!) 97.3 F (36.3 C) (Temporal)   BP Readings from Last 3 Encounters:  02/07/21 124/78  02/06/21 110/70  01/08/21 118/70   Pulse Readings from Last 3 Encounters:  02/07/21 70  02/06/21 65  01/08/21 66    Physical Exam Constitutional:      General: She is not in acute distress.    Appearance: She is obese. She is not  ill-appearing.  Musculoskeletal:     Lumbar back: Tenderness and bony tenderness present. No swelling, edema or spasms. Decreased range of motion.     Right lower leg: No edema.     Left lower leg: No edema.  Neurological:     Mental Status: She is alert and oriented to person, place, and time.  Psychiatric:        Mood and Affect: Mood normal.        Behavior: Behavior normal.      A/P:  1. Chronic bilateral low back pain without sciatica 2. Chronic pain of both hips - chronic, ongoing issue - pt cannot tolerate NSAIDs due cardio issues and on plavix - tylenol not effective, topicals with minimal relief - cont with regular exercises - agree with trial of back brace  3. Encounter for hepatitis C screening test for low risk patient - Hepatitis C Antibody   This visit occurred during the SARS-CoV-2 public health emergency.  Safety protocols were in place, including screening questions prior to the visit, additional usage of staff PPE, and extensive cleaning of exam room while observing appropriate contact time as indicated for disinfecting solutions.

## 2021-02-08 ENCOUNTER — Telehealth: Payer: Self-pay

## 2021-02-08 LAB — HEPATITIS C ANTIBODY
Hepatitis C Ab: NONREACTIVE
SIGNAL TO CUT-OFF: 0.01 (ref <1.00)

## 2021-02-08 NOTE — Telephone Encounter (Signed)
DJ, calling from [facility], to request notes in regards to the back brace.  Please fax notes from yesterday's visit to  [phone].  Please advise. Thank you

## 2021-02-08 NOTE — Telephone Encounter (Signed)
OV note faxed to [facility] for back brace @ [phone].  Dm/cma

## 2021-02-08 NOTE — Telephone Encounter (Signed)
OV notes were already faxed this morning to number below @10 :05am. Dm/cma

## 2021-02-21 ENCOUNTER — Other Ambulatory Visit: Payer: Self-pay | Admitting: Internal Medicine

## 2021-02-21 DIAGNOSIS — I25119 Atherosclerotic heart disease of native coronary artery with unspecified angina pectoris: Secondary | ICD-10-CM

## 2021-02-25 ENCOUNTER — Other Ambulatory Visit: Payer: Self-pay | Admitting: Family Medicine

## 2021-02-25 ENCOUNTER — Other Ambulatory Visit: Payer: Self-pay

## 2021-02-25 NOTE — Telephone Encounter (Signed)
Pt called to follow up on this, she said the pharmacy said there was an issue and that they said they were going to call us

## 2021-02-25 NOTE — Telephone Encounter (Signed)
Sent to the provider of the day.  Pt only has 1 left.  Please see message and advised.  Thank you.

## 2021-02-25 NOTE — Telephone Encounter (Signed)
Already addressed by Dr. Loletha Grayer

## 2021-02-25 NOTE — Telephone Encounter (Signed)
Called pharmacy to find out why they will not fill rx.  Spoke with Pharmacist, Magan and she informed me that it has to be refills on the rx before they can go by the note to pharmacy.

## 2021-02-26 ENCOUNTER — Other Ambulatory Visit: Payer: Self-pay | Admitting: Family Medicine

## 2021-02-26 NOTE — Telephone Encounter (Signed)
Good morning, so the pharmacy would not fill medication for patient because it had #30/0.  It has now been rewritten for #30/1.  Please send in for pt.  Thank you

## 2021-02-26 NOTE — Telephone Encounter (Signed)
I will let Dr. Loletha Grayer address this when she is in office tomorrow

## 2021-02-26 NOTE — Telephone Encounter (Signed)
Please see message and advise.  Thank you. ° °

## 2021-02-27 ENCOUNTER — Telehealth: Payer: Self-pay

## 2021-02-27 ENCOUNTER — Other Ambulatory Visit: Payer: Self-pay | Admitting: Family Medicine

## 2021-02-27 MED ORDER — BUTALBITAL-APAP-CAFF-COD 50-325-40-30 MG PO CAPS
1.0000 | ORAL_CAPSULE | Freq: Four times a day (QID) | ORAL | 1 refills | Status: DC | PRN
Start: 2021-02-27 — End: 2021-04-24

## 2021-02-27 MED ORDER — BUTALBITAL-APAP-CAFF-COD 50-325-40-30 MG PO CAPS
1.0000 | ORAL_CAPSULE | Freq: Four times a day (QID) | ORAL | 1 refills | Status: DC | PRN
Start: 2021-02-27 — End: 2021-02-27

## 2021-02-27 NOTE — Telephone Encounter (Signed)
Rx faxed to [facility] @ [phone].  Message left on patient's number to advise. Dm/cma

## 2021-02-27 NOTE — Telephone Encounter (Addendum)
Pt needs a refill for butalbital-acetaminophen-caffeine (FIORICET WITH CODEINE) 50-325-40-30 MG capsule [[id number]]   They pharmacy did not want to refill with 5 refills before July. I see where the RX was written today by Dr C. If it can be sent to the Pharmacy pt would like [facility] [place].  Thank you

## 2021-03-05 ENCOUNTER — Ambulatory Visit: Payer: Medicare Other | Admitting: Gastroenterology

## 2021-03-05 ENCOUNTER — Telehealth: Payer: Self-pay | Admitting: Family Medicine

## 2021-03-05 DIAGNOSIS — F411 Generalized anxiety disorder: Secondary | ICD-10-CM

## 2021-03-05 NOTE — Telephone Encounter (Signed)
Last OV 02/07/21 Last fill 11/28/20  #60/2

## 2021-03-07 NOTE — Telephone Encounter (Signed)
Patient states the pharmacy will not fill her Diazepam because it's an early refill. They said that they need a code from the provider in order to fill it early. Please call patient at  [phone] if you have any questions.

## 2021-03-08 NOTE — Telephone Encounter (Signed)
This was sent on 03/06/21. Ok to refill

## 2021-03-20 ENCOUNTER — Ambulatory Visit (INDEPENDENT_AMBULATORY_CARE_PROVIDER_SITE_OTHER): Payer: Medicare HMO | Admitting: Nurse Practitioner

## 2021-03-20 ENCOUNTER — Telehealth: Payer: Self-pay

## 2021-03-20 ENCOUNTER — Encounter: Payer: Self-pay | Admitting: Nurse Practitioner

## 2021-03-20 VITALS — BP 128/74 | HR 72 | Ht 62.0 in | Wt 186.0 lb

## 2021-03-20 DIAGNOSIS — Z7901 Long term (current) use of anticoagulants: Secondary | ICD-10-CM | POA: Diagnosis not present

## 2021-03-20 DIAGNOSIS — R079 Chest pain, unspecified: Secondary | ICD-10-CM | POA: Diagnosis not present

## 2021-03-20 DIAGNOSIS — Z86010 Personal history of colonic polyps: Secondary | ICD-10-CM | POA: Diagnosis not present

## 2021-03-20 MED ORDER — PLENVU 140 G PO SOLR: 1.0000 | ORAL | 0 refills | Status: DC

## 2021-03-20 NOTE — Progress Notes (Signed)
Reviewed and agree with management plan.  Malcolm T. Stark, MD FACG [phone]  

## 2021-03-20 NOTE — Telephone Encounter (Signed)
   Primary Cardiologist: Nelva Bush, MD  Chart reviewed as part of pre-operative protocol coverage. Given past medical history and time since last visit, based on ACC/AHA guidelines, Diane [name] would be at acceptable risk for the planned procedure without further cardiovascular testing.   She may hold her clopidogrel for 5 days prior to the procedure.  Dr. Saunders Revel would like her to be on aspirin daily while off clopidogrel.  Please have her resume clopidogrel as soon as hemostasis is achieved and stop aspirin.  I will route this recommendation to the requesting party via Epic fax function and remove from pre-op pool.  Please call with questions.  [name]. Cleaver NP-C    03/20/2021, 4:46 PM [place] [address] Office [phone] Fax [phone]

## 2021-03-20 NOTE — Patient Instructions (Signed)
If you are age 69 or older, your body mass index should be between 23-30. Your Body mass index is 34.02 kg/m. If this is out of the aforementioned range listed, please consider follow up with your Primary Care Provider.  If you are age 37 or younger, your body mass index should be between 19-25. Your Body mass index is 34.02 kg/m. If this is out of the aformentioned range listed, please consider follow up with your Primary Care Provider.   You have been scheduled for an endoscopy and colonoscopy. Please follow the written instructions given to you at your visit today. Please pick up your prep supplies at the pharmacy within the next 1-3 days. If you use inhalers (even only as needed), please bring them with you on the day of your procedure.  You will be contacted by our office prior to your procedure for directions on holding your Plavix.  If you do not hear from our office 1 week prior to your scheduled procedure, please call [phone] to discuss.   Restart your Nexium 40 mg 1 capsule every morning.  Follow up pending.  Thank you for entrusting me with your care and choosing [facility].  Tye Savoy, NP-C

## 2021-03-20 NOTE — Telephone Encounter (Signed)
[facility] Pre-operative Risk Assessment     Request for surgical clearance:     Endoscopy Procedure  What type of surgery is being performed?     Colonoscopy/Endoscopy  When is this surgery scheduled?     05/17/2021  What type of clearance is required ?   Pharmacy  Are there any medications that need to be held prior to surgery and how long? Plavix starting 5 days prior ( Dr. Darnelle Bos last office note gave clearance for her to hold this, we just need okay for 5 days.)  Practice name and name of physician performing surgery?      [facility]  What is your office phone and fax number?      Phone- [phone]  [phone]  Anesthesia type (None, local, MAC, general) ?       MAC

## 2021-03-20 NOTE — Progress Notes (Signed)
ASSESSMENT AND PLAN    # 70 year old female with a history of adenomatous and sessile serrated colon polyps.  She is due for 3-year interval colonoscopy --Patient will be scheduled for a colonoscopy. The risks and benefits of colonoscopy with possible polypectomy / biopsies were discussed and the patient agrees to proceed.   # Nonexertional chest pain.  Having postprandial chest pain over the last few weeks.  Evaluated  by Cardiology who does not feel pain is of cardiac origin.  Patient was on chronic PPI but stopped it last week as she did not think it was helping the chest pain.  Etiology of postprandial chest pain unclear.  Suppose that she could be having breakthrough GERD symptoms.  --For further evaluation will add on an EGD at the the time of colonoscopy.The risks and benefits of EGD were discussed and the patient agrees to proceed.  --In the interim I have asked her to resume Nexium 40 mg daily. She stopped it, but only a week ago, because it wasn't helping the chest pain. She has been on Nexium for history of GERD. [name]  --She has a history of Candida esophagitis in 2018.  Sounds like she is diligent in rinsing mouth following steroid inhalers --Chest pain may be secondary to anxiety / stress.   # CAD, status post PCI June 2021. On Plavix.  --Hold Plavix for 5 days before procedure - will instruct when and how to resume after procedure. Patient understands that there is a low but real risk of cardiovascular event such as heart attack, stroke, or embolism /  thrombosis, or ischemia while off Plavix. The patient consents to proceed. Will communicate by phone or EMR with patient's prescribing provider to confirm that holding Plavix is reasonable in this case.   HISTORY OF PRESENT ILLNESS     Chief Complaint : time for colonoscopy and also having chest pain   Diane [name] is a 70 y.o. female known to Dr. Fuller Plan with a past medical history significant for GERD, hiatal hernia, colon  polyps, diverticulosis,  CAD s/p PCI June 6270, diastolic dysfunction, HTN, hyperlipidemia, CVA.   Patient referred by PCP for history of colon polyps . She is due for polyp surveillance colonoscopy. Patient's son had stage IV colon cancer in his 85's and is remission. Patient has no lower GI symptoms other than occasional fecal leakage at times when she thinks she is passing flatus.    Patient gives a 3-week history of postprandial chest pain.  The pain starts under her left breast and radiates upward into her mid chest. The pain can last a couple of hours. Belching helps but she feels unable to belch sometimes. Alka Seltzer also helps the chest pain.   She saw her Cardiologist who didn't feel pain was cardiac in nature. Patient feels like the chest pain could be related to anxiety. The pain gets worse when she is upset. No dysphagia. She complains of LUQ "tenderness" without pain.  She apparently fractured several of her left anterior ribs a year ago. No NSAID use.   Patient has a history of GERD. She stopped Nexium last week because she felt like it wasn't helping the chest pain.  Off treatment she has had a few episodes of reflux. She has a history of candida esophagitis in 2018. She uses inhalers but is careful to rinse her mouth afterwards.    Data Reviewed: May 2018 echocardiogram --LVEF 65 to 70% --Left ventricle: The cavity size was normal. Wall  thickness was  normal. Systolic function was vigorous. The estimated ejection  fraction was in the range of 65% to 70%. Wall motion was normal;  there were no regional wall motion abnormalities. Doppler  parameters are consistent with abnormal left ventricular  relaxation (grade 1 diastolic dysfunction).  - Pulmonary arteries: Systolic pressure could not be accurately  estimated  PREVIOUS EVALUATIONS:   Feb 2018 Screening colonoscopy -- One 12 mm polyp in the ascending colon, removed with a hot snare. Resected and retrieved. -  Four 4 to 5 mm polyps in the transverse colon and in the ascending colon, removed with a cold biopsy forceps. Resected and retrieved. - One 7 mm polyp in the ascending colon, removed with a cold snare. Resected and retrieved. - Diverticulosis in the sigmoid colon. - The examination was otherwise normal on direct and retroflexion views.  Surgical [P], transverse and ascending, polyps (6) - FRAGMENTS OF TUBULAR ADENOMA (3) AND FRAGMENTS OF SESSILE SERRATED ADENOMA (3). NO HIGH GRADE DYSPLASIA OR MALIGNANCY IDENTIFIED  Feb 2018  EGD - Monilial esophagitis. - Normal stomach. - Normal duodenal bulb and second portion of the duodenum. - No specimens collected.   Past Medical History:  Diagnosis Date  . Arthritis   . CAD (coronary artery disease)    a. 09/2013 Cath: LM nl, LAD 30p, 53m, LCX nl, RCA 30p, EF 55-60%; b. 04/2017 MV: EF>65%, apical defect ->breast attenuation. Sm. area of isch cannot be excluded; c. 04/2020 Cor CTA: Ca2+ = 704 (96%). Sev RCA/LAD dzs; d. 05/2020 Cath/PCI: LM nl, LAD 4m, 66m/d, LCX 20p, RCA 80p (DFR 0.88-->2.5x12 Synergy XD DES).  . 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 Depression   . Diastolic dysfunction    a. 01/2013 Echo: EF 55-60%, no rwma, Gr1 DD, PASP 92mmHg; b. 04/2017 Echo: EF 65-70%, no rwma, Gr1 DD.  [name] GERD (gastroesophageal reflux disease)   . Hyperlipidemia   . Hypertension   . PONV (postoperative nausea and vomiting)   . Skin cancer of face 05/2015   basal cell  . Stroke [facility]) 2006   deaf in right ear  . Tubular adenoma of colon 2018     Past Surgical History:  Procedure Laterality Date  . BREAST BIOPSY Left 1984   abcess  . CARDIAC CATHETERIZATION    . CESAREAN SECTION    . CHOLECYSTECTOMY  1990  . CHONDROPLASTY Left 01/05/2015   Procedure: CHONDROPLASTY;  Surgeon: Yvette Rack., MD;  Location: [place];  Service: Orthopedics;  Laterality: Left;   . CORONARY STENT INTERVENTION N/A 06/01/2020   Procedure: CORONARY STENT INTERVENTION;  Surgeon: Nelva Bush, MD;  Location: [place] [facility];  Service: Cardiovascular;  Laterality: N/A;  . INTRAVASCULAR PRESSURE WIRE/FFR STUDY N/A 06/01/2020   Procedure: INTRAVASCULAR PRESSURE WIRE/FFR STUDY;  Surgeon: Nelva Bush, MD;  Location: [place] CV [facility];  Service: Cardiovascular;  Laterality: N/A;  . KNEE ARTHROSCOPY WITH MEDIAL MENISECTOMY Left 01/05/2015   Procedure: LEFT KNEE ARTHROSCOPY WITH MEDIAL AND LATERAL MENISECTOMY; DEBRIDEMENT LATERAL PATELLA FEMORAL ;  Surgeon: Yvette Rack., MD;  Location: [place];  Service: Orthopedics;  Laterality: Left;  . LEFT HEART CATH AND CORONARY ANGIOGRAPHY N/A 06/01/2020   Procedure: LEFT HEART CATH AND CORONARY ANGIOGRAPHY;  Surgeon: Nelva Bush, MD;  Location: [place] [facility];  Service: Cardiovascular;  Laterality: N/A;  . LEFT HEART CATHETERIZATION  WITH CORONARY ANGIOGRAM N/A 10/28/2013   Procedure: LEFT HEART CATHETERIZATION WITH CORONARY ANGIOGRAM;  Surgeon: Larey Dresser, MD;  Location: [facility] CATH LAB;  Service: Cardiovascular;  Laterality: N/A;  . TOTAL KNEE ARTHROPLASTY Left 07/06/2015   Procedure: TOTAL KNEE ARTHROPLASTY;  Surgeon: Earlie Server, MD;  Location: [place];  Service: Orthopedics;  Laterality: Left;  . TUBAL LIGATION     Family History  Problem Relation Age of Onset  . Hypertension Mother   . Stroke Mother   . Breast cancer Mother   . Stroke Father   . Heart failure Father   . Heart disease Father        CABG  . Ovarian cancer Paternal Grandmother   . Liver cancer Paternal Grandfather   . Stroke Maternal Grandmother   . Heart failure Maternal Grandmother   . Heart failure Maternal Grandfather   . Juvenile Diabetes Son   . Healthy Son   . Healthy Son   . Hypertension Son   . Healthy Son   . Hypertension Son   . Healthy Son   . Healthy Daughter   . Healthy Daughter   . Healthy Daughter   .  Healthy Daughter   . Colon cancer Neg Hx   . Stomach cancer Neg Hx   . Rectal cancer Neg Hx   . Esophageal cancer Neg Hx    Social History   Tobacco Use  . Smoking status: Never Smoker  . Smokeless tobacco: Never Used  . Tobacco comment: LIVES WITH 2 SMOKERS   Vaping Use  . Vaping Use: Never used  Substance Use Topics  . Alcohol use: Yes    Comment: 1 glass of wine per month  . Drug use: No   Current Outpatient Medications  Medication Sig Dispense Refill  . atorvastatin (LIPITOR) 80 MG tablet Take 1 tablet (80 mg total) by mouth daily. 90 tablet 3  . benazepril-hydrochlorthiazide (LOTENSIN HCT) 20-12.5 MG tablet Take 0.5 tablets by mouth daily. (Patient taking differently: Take 1 tablet by mouth daily.) 90 tablet 3  . budesonide-formoterol (SYMBICORT) 80-4.5 MCG/ACT inhaler Inhale 2 puffs into the lungs 2 (two) times daily. (Patient taking differently: Inhale 2 puffs into the lungs 2 (two) times daily as needed (asthma).) 1 Inhaler 12  . butalbital-acetaminophen-caffeine (FIORICET WITH CODEINE) 50-325-40-30 MG capsule Take 1 capsule by mouth every 6 (six) hours as needed for headache. 30 capsule 1  . cholecalciferol (VITAMIN D) 1000 UNITS tablet Take 1,000 Units by mouth daily.    . clopidogrel (PLAVIX) 75 MG tablet TAKE 1 TABLET BY MOUTH EVERY DAY 90 tablet 0  . diazepam (VALIUM) 5 MG tablet TAKE 1 TABLET (5 MG TOTAL) BY MOUTH EVERY 12 (TWELVE) HOURS AS NEEDED (ANXIETY, SLEEP). FOR ANXIETY 60 tablet 2  . diltiazem (CARDIZEM CD) 180 MG 24 hr capsule TAKE 1 CAPSULE BY MOUTH EVERY DAY 90 capsule 3  . ezetimibe (ZETIA) 10 MG tablet Take 10 mg by mouth daily.    . Multiple Minerals-Vitamins (CAL-MAG-ZINC-D) TABS Take by mouth.    . nitroGLYCERIN (NITROSTAT) 0.4 MG SL tablet Place 1 tablet (0.4 mg total) under the tongue every 5 (five) minutes as needed for chest pain. Maximum of 3 doses. 25 tablet 1  . PROAIR HFA 108 (90 Base) MCG/ACT inhaler INHALE 1 TO 2 PUFFS INTO THE LUNGS EVERY 6  HOURS AS NEEDED FOR WHEEZING OR SHORTNESS OF BREATH (Patient taking differently: Inhale 1-2 puffs into the lungs every 6 (six) hours as needed for wheezing or shortness  of breath.) 8.5 g 1  . ranolazine (RANEXA) 500 MG 12 hr tablet TAKE 1 TABLET BY MOUTH TWICE A DAY 180 tablet 3  . Rhubarb (ESTROVEN COMPLETE PO) Take 1 tablet by mouth daily.    . sertraline (ZOLOFT) 100 MG tablet Take 1 tablet (100 mg total) by mouth daily. 90 tablet 3  . valACYclovir (VALTREX) 1000 MG tablet Take 0.5 tablets (500 mg total) by mouth 2 (two) times daily. (Patient taking differently: Take 500 mg by mouth as needed.) 30 tablet 1   No current facility-administered medications for this visit.   Allergies  Allergen Reactions  . Boniva [Ibandronic Acid]     Joint Pain and Stiffness   . Fosamax [Alendronate Sodium] Other (See Comments)    GERD and joint pain  . Prolia [Denosumab] Other (See Comments)    Joint pain and in bed for 3 days     Review of Systems: All systems reviewed and negative except where noted in HPI.   PHYSICAL EXAM :    Wt Readings from Last 3 Encounters:  03/20/21 186 lb (84.4 kg)  02/07/21 185 lb 12.8 oz (84.3 kg)  02/06/21 186 lb 2 oz (84.4 kg)    BP 128/74   Pulse 72   Ht 5\' 2"  (1.575 m)   Wt 186 lb (84.4 kg)   SpO2 98%   BMI 34.02 kg/m  Constitutional:  Pleasant female in no acute distress. Psychiatric: Normal mood and affect. Behavior is normal. EENT: Pupils normal.  Conjunctivae are normal. No scleral icterus. Neck supple.  Cardiovascular: Normal rate, regular rhythm. No edema Pulmonary/chest: Effort normal and breath sounds normal. No wheezing, rales or rhonchi. Abdominal: Soft, nondistended, nontender. Bowel sounds active throughout. There are no masses palpable. No hepatomegaly. Neurological: Alert and oriented to person place and time. Skin: Skin is warm and dry. No rashes noted.  Tye Savoy, NP  03/20/2021, 2:25 PM  Cc:  Referring Provider Ronnald Nian, DO

## 2021-03-27 ENCOUNTER — Other Ambulatory Visit: Payer: Self-pay | Admitting: Nurse Practitioner

## 2021-03-28 ENCOUNTER — Other Ambulatory Visit: Payer: Self-pay | Admitting: Family Medicine

## 2021-03-28 DIAGNOSIS — F411 Generalized anxiety disorder: Secondary | ICD-10-CM

## 2021-03-28 DIAGNOSIS — F339 Major depressive disorder, recurrent, unspecified: Secondary | ICD-10-CM

## 2021-03-28 NOTE — Telephone Encounter (Signed)
Pt called to f/u. She has 1 pill left. The pharmacy sent it last week to the wrong provider. Advised pt of 48 hours typical refill time.

## 2021-04-10 ENCOUNTER — Other Ambulatory Visit: Payer: Medicare HMO

## 2021-04-11 NOTE — Telephone Encounter (Signed)
Patient has been notified to stop her Clopidogrel 5 days prior to her procedure and take ASA. She is aware that she will restart afterwards and stop the ASA.

## 2021-04-23 ENCOUNTER — Other Ambulatory Visit: Payer: Self-pay | Admitting: Family Medicine

## 2021-04-24 ENCOUNTER — Other Ambulatory Visit: Payer: Self-pay | Admitting: Family Medicine

## 2021-04-24 DIAGNOSIS — N63 Unspecified lump in unspecified breast: Secondary | ICD-10-CM

## 2021-04-24 NOTE — Telephone Encounter (Signed)
Called patient and left message to call the office and schedule an appointment.

## 2021-04-26 ENCOUNTER — Telehealth (INDEPENDENT_AMBULATORY_CARE_PROVIDER_SITE_OTHER): Payer: Medicare HMO | Admitting: Family Medicine

## 2021-04-26 ENCOUNTER — Encounter: Payer: Self-pay | Admitting: Family Medicine

## 2021-04-26 DIAGNOSIS — M542 Cervicalgia: Secondary | ICD-10-CM

## 2021-04-26 DIAGNOSIS — R519 Headache, unspecified: Secondary | ICD-10-CM | POA: Diagnosis not present

## 2021-04-26 DIAGNOSIS — G8929 Other chronic pain: Secondary | ICD-10-CM

## 2021-04-26 DIAGNOSIS — J301 Allergic rhinitis due to pollen: Secondary | ICD-10-CM

## 2021-04-26 NOTE — Progress Notes (Signed)
Virtual Visit via Video Note  I connected with [name] on 04/26/21 at  9:00 AM EDT by a video enabled telemedicine application and verified that I am speaking with the correct person using two identifiers. Location patient: home Location provider: work Persons participating in the virtual visit: patient, provider  I discussed the limitations of evaluation and management by telemedicine and the availability of in person appointments. The patient expressed understanding and agreed to proceed.  Chief Complaint  Patient presents with  . Medication Refill    Pt will discuss medication/butalbital-acetaminophen-caffeine (FIORICET WITH CODEINE) 50-325-40-30 MG     HPI: Diane [name] is a 70 y.o. female patient seen today to discuss pts headaches and use of fioricet w/ codeine.  She estimates she was having 2 headaches per week but in the past 2 mo she is having 3 headaches per week. When she has a headache She takes extra strength tylenol but she feels it does not get rid of the headache. Pt feels seasonal allergies are causing her to have more headaches. She is using flonase and taking xyzal. She has been trying to walk daily for her mental health and arthritis but feels this worsens her allergies.  She states she has arthritis in her neck but that is not being treated. She states she had a reaction to 3 or more of the meds that have been tried. She feels driving in the car will trigger a headache and she drives 1 hr 35WSF to see her kids She was not able to tolerate imitrex d/t elevated BP. She cannot take/tolerate NSAIDs d/t plavix.   Past Medical History:  Diagnosis Date  . Arthritis   . CAD (coronary artery disease)    a. 09/2013 Cath: LM nl, LAD 30p, 69m LCX nl, RCA 30p, EF 55-60%; b. 04/2017 MV: EF>65%, apical defect ->breast attenuation. Sm. area of isch cannot be excluded; c. 04/2020 Cor CTA: Ca2+ = 704 (96%). Sev RCA/LAD dzs; d. 05/2020 Cath/PCI: LM nl, LAD 369m4519m  LCX 20p, RCA 80p (DFR 0.88-->2.5x12 Synergy XD DES).  . 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 Depression   . Diastolic dysfunction    a. 01/2013 Echo: EF 55-60%, no rwma, Gr1 DD, PASP 15m49m b. 04/2017 Echo: EF 65-70%, no rwma, Gr1 DD.  . GEMarland KitchenD (gastroesophageal reflux disease)   . Hyperlipidemia   . Hypertension   . PONV (postoperative nausea and vomiting)   . Skin cancer of face 05/2015   basal cell  . [facility]   deaf in right ear  . Tubular adenoma of colon 2018    Past Surgical History:  Procedure Laterality Date  . BREAST BIOPSY Left 1984   abcess  . CARDIAC CATHETERIZATION    . CESAREAN SECTION    . CHOLECYSTECTOMY  1990  . CHONDROPLASTY Left 01/05/2015   Procedure: CHONDROPLASTY;  Surgeon: W D Yvette RackD;  [place];  Laterality: Left;  . CORONARY STENT INTERVENTION N/A 06/01/2020   Procedure: CORONARY STENT INTERVENTION;  Surgeon: End,Nelva Bush;  [facility];  Service: Cardiovascular;  Laterality: N/A;  . INTRAVASCULAR PRESSURE WIRE/FFR STUDY N/A 06/01/2020   Procedure: INTRAVASCULAR PRESSURE WIRE/FFR STUDY;  Surgeon: End,Nelva Bush;  [facility] [place];  Service: Cardiovascular;  Laterality: N/A;  . KNEE ARTHROSCOPY WITH MEDIAL MENISECTOMY Left 01/05/2015  Procedure: LEFT KNEE ARTHROSCOPY WITH MEDIAL AND LATERAL MENISECTOMY; DEBRIDEMENT LATERAL PATELLA FEMORAL ;  Surgeon: Yvette Rack., MD;  Location: [place];  Service: Orthopedics;  Laterality: Left;  . LEFT HEART CATH AND CORONARY ANGIOGRAPHY N/A 06/01/2020   Procedure: LEFT HEART CATH AND CORONARY ANGIOGRAPHY;  Surgeon: Nelva Bush, MD;  Location: [place] CV LAB;  Service: Cardiovascular;  Laterality: N/A;  . LEFT HEART CATHETERIZATION WITH CORONARY ANGIOGRAM N/A 10/28/2013   Procedure: LEFT HEART CATHETERIZATION WITH  CORONARY ANGIOGRAM;  Surgeon: Larey Dresser, MD;  Location: [facility] CATH LAB;  Service: Cardiovascular;  Laterality: N/A;  . TOTAL KNEE ARTHROPLASTY Left 07/06/2015   Procedure: TOTAL KNEE ARTHROPLASTY;  Surgeon: Earlie Server, MD;  Location: [place];  Service: Orthopedics;  Laterality: Left;  . TUBAL LIGATION      Family History  Problem Relation Age of Onset  . Hypertension Mother   . Stroke Mother   . Breast cancer Mother   . Stroke Father   . Heart failure Father   . Heart disease Father        CABG  . Ovarian cancer Paternal Grandmother   . Liver cancer Paternal Grandfather   . Stroke Maternal Grandmother   . Heart failure Maternal Grandmother   . Heart failure Maternal Grandfather   . Juvenile Diabetes Son   . Healthy Son   . Colon cancer Son 76  . Testicular cancer Son   . Liver cancer Son   . Healthy Son   . Hypertension Son   . Healthy Son   . Hypertension Son   . Healthy Son   . Healthy Daughter   . Healthy Daughter   . Healthy Daughter   . Healthy Daughter   . Stomach cancer Neg Hx   . Rectal cancer Neg Hx   . Esophageal cancer Neg Hx     Social History   Tobacco Use  . Smoking status: Never Smoker  . Smokeless tobacco: Never Used  . Tobacco comment: LIVES WITH 2 SMOKERS   Vaping Use  . Vaping Use: Never used  Substance Use Topics  . Alcohol use: Yes    Comment: 1 glass of wine per month  . Drug use: No     Current Outpatient Medications:  .  atorvastatin (LIPITOR) 80 MG tablet, Take 1 tablet (80 mg total) by mouth daily., Disp: 90 tablet, Rfl: 3 .  benazepril-hydrochlorthiazide (LOTENSIN HCT) 20-12.5 MG tablet, Take 0.5 tablets by mouth daily. (Patient taking differently: Take 1 tablet by mouth daily.), Disp: 90 tablet, Rfl: 3 .  budesonide-formoterol (SYMBICORT) 80-4.5 MCG/ACT inhaler, Inhale 2 puffs into the lungs 2 (two) times daily. (Patient taking differently: Inhale 2 puffs into the lungs 2 (two) times daily as needed (asthma).), Disp: 1 Inhaler,  Rfl: 12 .  butalbital-acetaminophen-caffeine (FIORICET WITH CODEINE) 50-325-40-30 MG capsule, TAKE 1 CAPSULE EVERY 6 HOURS AS NEEDED FOR HEADACHE, Disp: 30 capsule, Rfl: 1 .  cholecalciferol (VITAMIN D) 1000 UNITS tablet, Take 1,000 Units by mouth daily., Disp: , Rfl:  .  clopidogrel (PLAVIX) 75 MG tablet, TAKE 1 TABLET BY MOUTH EVERY DAY, Disp: 90 tablet, Rfl: 0 .  diazepam (VALIUM) 5 MG tablet, TAKE 1 TABLET (5 MG TOTAL) BY MOUTH EVERY 12 (TWELVE) HOURS AS NEEDED (ANXIETY, SLEEP). FOR ANXIETY, Disp: 60 tablet, Rfl: 2 .  diltiazem (CARDIZEM CD) 180 MG 24 hr capsule, TAKE 1 CAPSULE BY MOUTH EVERY DAY, Disp: 90 capsule, Rfl: 3 .  ezetimibe (ZETIA) 10 MG tablet, Take  10 mg by mouth daily., Disp: , Rfl:  .  Multiple Minerals-Vitamins (CAL-MAG-ZINC-D) TABS, Take by mouth., Disp: , Rfl:  .  nitroGLYCERIN (NITROSTAT) 0.4 MG SL tablet, Place 1 tablet (0.4 mg total) under the tongue every 5 (five) minutes as needed for chest pain. Maximum of 3 doses., Disp: 25 tablet, Rfl: 1 .  PEG-KCl-NaCl-NaSulf-Na Asc-C (PLENVU) 140 g SOLR, Take 1 kit by mouth as directed., Disp: 1 each, Rfl: 0 .  PROAIR HFA 108 (90 Base) MCG/ACT inhaler, INHALE 1 TO 2 PUFFS INTO THE LUNGS EVERY 6 HOURS AS NEEDED FOR WHEEZING OR SHORTNESS OF BREATH (Patient taking differently: Inhale 1-2 puffs into the lungs every 6 (six) hours as needed for wheezing or shortness of breath.), Disp: 8.5 g, Rfl: 1 .  ranolazine (RANEXA) 500 MG 12 hr tablet, TAKE 1 TABLET BY MOUTH TWICE A DAY, Disp: 180 tablet, Rfl: 3 .  Rhubarb (ESTROVEN COMPLETE PO), Take 1 tablet by mouth daily., Disp: , Rfl:  .  sertraline (ZOLOFT) 100 MG tablet, TAKE 1 TABLET BY MOUTH EVERY DAY, Disp: 90 tablet, Rfl: 3 .  valACYclovir (VALTREX) 1000 MG tablet, Take 0.5 tablets (500 mg total) by mouth 2 (two) times daily. (Patient taking differently: Take 500 mg by mouth as needed.), Disp: 30 tablet, Rfl: 1  Allergies  Allergen Reactions  . Boniva [Ibandronic Acid]     Joint Pain and  Stiffness   . Fosamax [Alendronate Sodium] Other (See Comments)    GERD and joint pain  . Prolia [Denosumab] Other (See Comments)    Joint pain and in bed for 3 days      ROS: See pertinent positives and negatives per HPI.   EXAM:  VITALS per patient if applicable: There were no vitals taken for this visit.  Wt Readings from Last 3 Encounters:  03/20/21 186 lb (84.4 kg)  02/07/21 185 lb 12.8 oz (84.3 kg)  02/06/21 186 lb 2 oz (84.4 kg)   Temp Readings from Last 3 Encounters:  02/07/21 97.8 F (36.6 C) (Temporal)  01/08/21 97.9 F (36.6 C) (Temporal)  10/12/20 (!) 97.3 F (36.3 C) (Temporal)   BP Readings from Last 3 Encounters:  03/20/21 128/74  02/07/21 124/78  02/06/21 110/70   Pulse Readings from Last 3 Encounters:  03/20/21 72  02/07/21 70  02/06/21 65     GENERAL: alert, oriented, in no acute distress  HEENT: atraumatic, conjunctiva clear, no obvious abnormalities on inspection of external nose and ears  NECK: normal movements of the head and neck  LUNGS: on inspection no signs of respiratory distress, breathing rate appears normal, no obvious gross SOB, gasping or wheezing, no conversational dyspnea  CV: no obvious cyanosis  PSYCH/NEURO: pleasant and cooperative, no obvious depression or anxiety, speech and thought processing grossly intact   ASSESSMENT AND PLAN:  1. Chronic nonintractable headache, unspecified headache type 2. Chronic neck pain 3. Seasonal allergic rhinitis due to pollen - pt states she had 2-3 headaches per week but notes more in the past 2 mo. She attributes this mainly to allergy symptoms but also to chronic neck pain which she feels triggers headaches - she cannot tolerate NSAIDs and has been tried on triptan in the past but has reaction of elevated BP - discussed need to get underlying conditions better controlled in order to reduce the frequency of headaches and therefore the use of fioricet w/ codeine. She states she does not  take this daily but takes about 3 tabs (every 6hrs) when she does  have a headache.  - recommended dose of extra strength tylenol at onset of headache then if needed 1 tab of fioricet and then repeat tylenol if needed - referral to PT for chronic neck pain - Ambulatory referral to Physical Therapy - cont xyzal and flonase but increase flonase to BID x 2 wks and add nasal saline spray and cool mist humidifier at night and change clothes after being outside - cont PRN fioricet w/ codeine, does not need refill at this time - f/u in 4 wks if no/minimal improvement  I discussed the assessment and treatment plan with the patient. The patient was provided an opportunity to ask questions and all were answered. The patient agreed with the plan and demonstrated an understanding of the instructions.   The patient was advised to call back or seek an in-person evaluation if the symptoms worsen or if the condition fails to improve as anticipated.   Letta Median, DO

## 2021-04-29 ENCOUNTER — Ambulatory Visit
Admission: RE | Admit: 2021-04-29 | Discharge: 2021-04-29 | Disposition: A | Discharge status: Home / Self Care | Payer: Medicare HMO | Source: Ambulatory Visit | Attending: Family Medicine | Admitting: Family Medicine

## 2021-04-29 ENCOUNTER — Ambulatory Visit: Payer: Medicare HMO

## 2021-04-29 ENCOUNTER — Other Ambulatory Visit: Payer: Self-pay

## 2021-04-29 DIAGNOSIS — R928 Other abnormal and inconclusive findings on diagnostic imaging of breast: Secondary | ICD-10-CM | POA: Diagnosis not present

## 2021-05-08 ENCOUNTER — Ambulatory Visit: Payer: Medicare HMO | Admitting: Internal Medicine

## 2021-05-10 ENCOUNTER — Encounter: Payer: Self-pay | Admitting: Gastroenterology

## 2021-05-14 ENCOUNTER — Other Ambulatory Visit: Payer: Self-pay

## 2021-05-14 ENCOUNTER — Encounter: Payer: Self-pay | Admitting: Family

## 2021-05-14 ENCOUNTER — Ambulatory Visit: Payer: Medicare HMO | Admitting: Family

## 2021-05-14 VITALS — BP 124/72 | HR 66 | Ht 62.0 in | Wt 188.0 lb

## 2021-05-14 DIAGNOSIS — I25118 Atherosclerotic heart disease of native coronary artery with other forms of angina pectoris: Secondary | ICD-10-CM | POA: Diagnosis not present

## 2021-05-14 DIAGNOSIS — E785 Hyperlipidemia, unspecified: Secondary | ICD-10-CM | POA: Diagnosis not present

## 2021-05-14 DIAGNOSIS — I10 Essential (primary) hypertension: Secondary | ICD-10-CM

## 2021-05-14 MED ORDER — BENAZEPRIL-HYDROCHLOROTHIAZIDE 20-12.5 MG PO TABS: 1.0000 | ORAL_TABLET | Freq: Every day | ORAL | 3 refills | Status: DC

## 2021-05-14 NOTE — Progress Notes (Signed)
Office Visit    Patient Name: Diane [name] Date of Encounter: 05/14/2021  Primary Care Provider:  Ronnald Nian, DO Primary Cardiologist:  Diane Bush, MD Electrophysiologist:  None   Chief Complaint    Diane [name] is a 70 y.o. female with a hx of CAD, HTN, HLD, diastolic dysfunction, CVA, GERD presents today for follow up of CAD.  Past Medical History    Past Medical History:  Diagnosis Date  . Arthritis   . CAD (coronary artery disease)    a. 09/2013 Cath: LM nl, LAD 30p, 83m LCX nl, RCA 30p, EF 55-60%; b. 04/2017 MV: EF>65%, apical defect ->breast attenuation. Sm. area of isch cannot be excluded; c. 04/2020 Cor CTA: Ca2+ = 704 (96%). Sev RCA/LAD dzs; d. 05/2020 Cath/PCI: LM nl, LAD 359m451m LCX 20p, RCA 80p (DFR 0.88-->2.5x12 Synergy XD DES).  . 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 Depression   . Diastolic dysfunction    a. 01/2013 Echo: EF 55-60%, no rwma, Gr1 DD, PASP 22m90m b. 04/2017 Echo: EF 65-70%, no rwma, Gr1 DD.  . GEMarland KitchenD (gastroesophageal reflux disease)   . Hyperlipidemia   . Hypertension   . PONV (postoperative nausea and vomiting)   . Skin cancer of face 05/2015   basal cell  . [facility]   deaf in right ear  . Tubular adenoma of colon 2018   Past Surgical History:  Procedure Laterality Date  . BREAST BIOPSY Left 1984   abcess  . CARDIAC CATHETERIZATION    . CESAREAN SECTION    . CHOLECYSTECTOMY  1990  . CHONDROPLASTY Left 01/05/2015   Procedure: CHONDROPLASTY;  Surgeon: Diane D Yvette RackD;  [place];  Laterality: Left;  . CORONARY STENT INTERVENTION N/A 06/01/2020   Procedure: CORONARY STENT INTERVENTION;  Surgeon: DianeDiane [name];  [facility] [place];  Service: Cardiovascular;  Laterality: N/A;  . INTRAVASCULAR PRESSURE WIRE/FFR STUDY N/A 06/01/2020   Procedure: INTRAVASCULAR  PRESSURE WIRE/FFR STUDY;  Surgeon: DianeDiane [name];  [facility];  Service: Cardiovascular;  Laterality: N/A;  . KNEE ARTHROSCOPY WITH MEDIAL MENISECTOMY Left 01/05/2015   Procedure: LEFT KNEE ARTHROSCOPY WITH MEDIAL AND LATERAL MENISECTOMY; DEBRIDEMENT LATERAL PATELLA FEMORAL ;  Surgeon: Diane D Yvette RackD;  [place];  Laterality: Left;  . LEFT HEART CATH AND CORONARY ANGIOGRAPHY N/A 06/01/2020   Procedure: LEFT HEART CATH AND CORONARY ANGIOGRAPHY;  Surgeon: DianeDiane [name];  [facility];  Service: Cardiovascular;  Laterality: N/A;  . LEFT HEART CATHETERIZATION WITH CORONARY ANGIOGRAM N/A 10/28/2013   Procedure: LEFT HEART CATHETERIZATION WITH CORONARY ANGIOGRAM;  Surgeon: DaltLarey [name];  [facility];  Service: Cardiovascular;  Laterality: N/A;  . TOTAL KNEE ARTHROPLASTY Left 07/06/2015   Procedure: TOTAL KNEE ARTHROPLASTY;  Surgeon: DaniEarlie [name];  [facility];  Laterality: Left;  . TUBAL LIGATION      Allergies  Allergies  Allergen Reactions  . Boniva [Ibandronic Acid]     Joint Pain and Stiffness   . Fosamax [Alendronate Sodium] Other (See Comments)    GERD and joint pain  . Prolia [Denosumab] Other (See Comments)    Joint pain and in bed for 3 days    History of Present Illness  Diane [name] is a 70 y.o. female with a hx of CAD, HTN, HLD, diastolic dysfunction, CVA, GERD last seen [phone] 02/06/21.  Previous cardiac workup includes cardiac catheterization 09/2013 with moderate, nonobstructive CAD. Stress test May 2018 with breast attenuation though small area ischemia unable to excluded, EF 65%, Seen in clinic 01/2020 with chest discomfort and DOE. Coronary CTA 04/2020 with significant two-vessel CAD involving large LAD and codominant RCA. Diagnostic catheterization 06/01/2020 with 80% stenosis prox RCA with abnormal diastolic hyperemia free ratio  (DFR) of 0.88. Otherwise nonobstructive disease of LAD and circumflex. RCA trested with DES. Seen in clinic 06/07/20 for follow up feeling overall well, noted mildly elevated BP though home readings were good, and started on Zetia 28m daily. Seen in follow up 07/13/20 tolerating Zetia, her LDL on labs was 59.  She was doing well from a cardiac perspective and no changes were made.  She was seen in follow up 10/15/20 noting intermittent sensations of chest heaviness at rest which she attributed to grief after losing her mother and partner. She was walking regularly for exercise without difficulty. No changes were made. She was seen 02/06/21 by Dr. ESaunders Revelnoting occasional nonexertional chest pain which improved with belching and was different than her anginal equivalent. Ischemic evaluation was not recommended. Her BP was low normal with intermittent lightheadedness and Benazepril-HCTZ was reduced by half.  She presents today for follow up.  She reports feeling overall well and improved over the last 3 months.  She did notice her blood pressure was escalating to return to her whole tablet of benazepril hydrochlorothiazide.  Her blood pressure since that time has been well controlled at home.  Tells me her indigestion has improved. She has a colonscopy and endoscopy scheduled for Friday and has appropriately been holding Plavix as recommended by Dr. ESaunders RevelReports no shortness of breath nor dyspnea on exertion. Reports no chest pain, pressure, or tightness. No edema, orthopnea, PND. Reports no palpitations.   EKGs/Labs/Other Studies Reviewed:   The following studies were reviewed today:  LHC 06/01/20 Conclusions: 1. Severe single-vessel coronary artery disease with 80% proximal RCA stenosis that is hemodynamically significant by DFR (0.88). 2. Mild to moderate, non-obstructive coronary artery disease involving the LAD and LCx. 3. Hyperdynamic left ventricular systolic function with normal filling  pressure. 4. Successful PCI to proximal RCA using Syergy 2.5 x 12 mm drug-eluting stent with 0% residual stenosis and TIMI-3 flow.   Recommendations: 1. Dual antiplatelet therapy with aspirin and clopidogrel for at least 6 months. 2. Aggressive secondary prevention. 3. Anticipate same-day discharge if no post-cath complications.  EKG:  No EKG today.   Recent Labs: 05/18/2020: Hemoglobin 15.0; Platelets 184 07/13/2020: ALT 23; BUN 14; Creatinine, Ser 0.75; Potassium 4.3; Sodium 140  Recent Lipid Panel    Component Value Date/Time   CHOL 166 07/13/2020 1216   TRIG 151 (H) 07/13/2020 1216   HDL 71 07/13/2020 1216   CHOLHDL 2.3 07/13/2020 1216   CHOLHDL 3 09/08/2018 0811   VLDL 30.8 09/08/2018 0811   LDLCALC 70 07/13/2020 1216   LDLDIRECT 59 07/13/2020 1216    Home Medications   Current Meds  Medication Sig  . albuterol (VENTOLIN HFA) 108 (90 Base) MCG/ACT inhaler Inhale 1-2 puffs into the lungs every 6 (six) hours as needed for wheezing or shortness of breath.  .Marland Kitchenatorvastatin (LIPITOR) 80 MG tablet Take 1 tablet (80 mg total) by mouth daily.  . benazepril-hydrochlorthiazide (LOTENSIN HCT) 20-12.5 MG tablet Take 1 tablet by mouth daily.  .[name]  budesonide-formoterol (SYMBICORT) 80-4.5 MCG/ACT inhaler Inhale 2 puffs into the lungs 2 (two) times daily as needed.  . butalbital-acetaminophen-caffeine (FIORICET WITH CODEINE) 50-325-40-30 MG capsule TAKE 1 CAPSULE EVERY 6 HOURS AS NEEDED FOR HEADACHE  . cholecalciferol (VITAMIN D) 1000 UNITS tablet Take 1,000 Units by mouth daily.  . clopidogrel (PLAVIX) 75 MG tablet TAKE 1 TABLET BY MOUTH EVERY DAY  . diazepam (VALIUM) 5 MG tablet TAKE 1 TABLET (5 MG TOTAL) BY MOUTH EVERY 12 (TWELVE) HOURS AS NEEDED (ANXIETY, SLEEP). FOR ANXIETY  . diltiazem (CARDIZEM CD) 180 MG 24 hr capsule TAKE 1 CAPSULE BY MOUTH EVERY DAY  . ezetimibe (ZETIA) 10 MG tablet Take 10 mg by mouth daily.  . Multiple Minerals-Vitamins (CAL-MAG-ZINC-D) TABS Take by mouth.  .  nitroGLYCERIN (NITROSTAT) 0.4 MG SL tablet Place 1 tablet (0.4 mg total) under the tongue every 5 (five) minutes as needed for chest pain. Maximum of 3 doses.  [name] PEG-KCl-NaCl-NaSulf-Na Asc-C (PLENVU) 140 g SOLR Take 1 kit by mouth as directed.  . ranolazine (RANEXA) 500 MG 12 hr tablet TAKE 1 TABLET BY MOUTH TWICE A DAY  . Rhubarb (ESTROVEN COMPLETE PO) Take 1 tablet by mouth daily.  . sertraline (ZOLOFT) 100 MG tablet TAKE 1 TABLET BY MOUTH EVERY DAY  . valACYclovir (VALTREX) 1000 MG tablet Take 0.5 tablets (500 mg total) by mouth 2 (two) times daily. (Patient taking differently: Take 500 mg by mouth as needed.)  . [DISCONTINUED] benazepril-hydrochlorthiazide (LOTENSIN HCT) 20-12.5 MG tablet Take 0.5 tablets by mouth daily. (Patient taking differently: Take 1 tablet by mouth daily.)  . [DISCONTINUED] budesonide-formoterol (SYMBICORT) 80-4.5 MCG/ACT inhaler Inhale 2 puffs into the lungs 2 (two) times daily. (Patient taking differently: Inhale 2 puffs into the lungs 2 (two) times daily as needed (asthma).)  . [DISCONTINUED] PROAIR HFA 108 (90 Base) MCG/ACT inhaler INHALE 1 TO 2 PUFFS INTO THE LUNGS EVERY 6 HOURS AS NEEDED FOR WHEEZING OR SHORTNESS OF BREATH (Patient taking differently: Inhale 1-2 puffs into the lungs every 6 (six) hours as needed for wheezing or shortness of breath.)    Review of Systems   All other systems reviewed and are otherwise negative except as noted above.  Physical Exam    VS:  BP (!) 144/92   Pulse 66   Ht 5' 2"  (1.575 m)   Wt 188 lb (85.3 kg)   BMI 34.39 kg/m  , BMI Body mass index is 34.39 kg/m. GEN: Well nourished, overweight, well developed, in no acute distress. HEENT: normal. Neck: Supple, no JVD, carotid bruits, or masses. Cardiac: RRR, no murmurs, rubs, or gallops. No clubbing, cyanosis, edema.  Radials/DP/PT 2+ and equal bilaterally.  Respiratory:  Respirations regular and unlabored, clear to auscultation bilaterally. GI: Soft, nontender, nondistended,  BS + x 4. MS: No deformity or atrophy. Skin: Warm and dry, no rash. Neuro:  Strength and sensation are intact. Psych: Normal affect.  Assessment & Plan   1. CAD - S/p DES to RCA 06/01/20. Per primary cardiologist continue Plavix/Asa for indefinite secondary prevention.  May hold Plavix as needed for procedures such as colonoscopy.  Denies anginal symptoms.  No indication for ischemic evaluation at this time.  GDMT presently includes aspirin, Plavix, statin, Plavix, Ranolazine, Zetia.  Heart healthy diet and regular cardiovascular exercise encouraged. CBC, CMP, lipid for monitoring.   2. HTN -BP at goal.  Continue benazepril-HCTZ 20-12.46m daily. Refill provided.   3. HLD, LDL goal <70 - CMP, lipid panel today for monitoring. Continue Atorvastatin 80 mg daily, Zetia  10 mg daily.  Disposition: Follow up in 4 month(s)  with Dr. Saunders [name] or APP.   Loel Dubonnet, NP 05/14/2021, 2:28 PM

## 2021-05-14 NOTE — Patient Instructions (Addendum)
Medication Instructions:  Continue your current medications.   *If you need a refill on your cardiac medications before your next appointment, please call your pharmacy*  Lab Work: Your provider recommends lab work today: lipid panel, CMP, CBC  Testing/Procedures: None ordered today.   Follow-Up: At [facility], you and your health needs are our priority.  As part of our continuing mission to provide you with exceptional heart care, we have created designated Provider Care Teams.  These Care Teams include your primary Cardiologist (physician) and Advanced Practice Providers (APPs -  Physician Assistants and Nurse Practitioners) who all work together to provide you with the care you need, when you need it.   Your next appointment:   4 month(s)  The format for your next appointment:   In Person  Provider:   You may see Nelva Bush, MD or one of the following Advanced Practice Providers on your designated Care Team:    Murray Hodgkins, NP  Christell Faith, PA-C  Marrianne Mood, PA-C  Cadence Kathlen Mody, [place]   Other Instructions  Heart Healthy Diet Recommendations: A low-salt diet is recommended. Meats should be grilled, baked, or boiled. Avoid fried foods. Focus on lean protein sources like fish or chicken with vegetables and fruits. The American Heart Association is a Microbiologist!  American Heart Association Diet and Lifeystyle Recommendations   Exercise recommendations: The American Heart Association recommends 150 minutes of moderate intensity exercise weekly. Try 30 minutes of moderate intensity exercise 4-5 times per week. This could include walking, jogging, or swimming.  Tips to Measure your Blood Pressure Correctly  Here's what you can do to ensure a correct reading: . Don't drink a caffeinated beverage or smoke during the 30 minutes before the test. . Sit quietly for five minutes before the test begins. . During the measurement, sit in a chair with your feet  on the floor and your arm supported so your elbow is at about heart level. . The inflatable part of the cuff should completely cover at least 80% of your upper arm, and the cuff should be placed on bare skin, not over a shirt. . Don't talk during the measurement. . Have your blood pressure measured twice, with a brief break in between. If the readings are different by 5 points or more, have it done a third time.  Blood pressure categories  Blood pressure category SYSTOLIC (upper number)  DIASTOLIC (lower number)  Normal Less than 120 mm Hg and Less than 80 mm Hg  Elevated 120-129 mm Hg and Less than 80 mm Hg  High blood pressure: Stage 1 hypertension 130-139 mm Hg or 80-89 mm Hg  High blood pressure: Stage 2 hypertension 140 mm Hg or higher or 90 mm Hg or higher  Hypertensive crisis (consult your doctor immediately) Higher than 180 mm Hg and/or Higher than 120 mm Hg  Source: American Heart Association and American Stroke Association. For more on getting your blood pressure under control, buy Controlling Your Blood Pressure, a Special Health Report from [facility].   Blood Pressure Log   Date   Time  Blood Pressure  Position  Example: Nov 1 9 AM 124/78 sitting

## 2021-05-15 LAB — COMPREHENSIVE METABOLIC PANEL
ALT: 32 IU/L (ref 0–32)
AST: 24 IU/L (ref 0–40)
Albumin/Globulin Ratio: 1.9 (ref 1.2–2.2)
Albumin: 4.7 g/dL (ref 3.8–4.8)
Alkaline Phosphatase: 80 IU/L (ref 44–121)
BUN/Creatinine Ratio: 12 (ref 12–28)
BUN: 10 mg/dL (ref 8–27)
Bilirubin Total: 0.4 mg/dL (ref 0.0–1.2)
CO2: 25 mmol/L (ref 20–29)
Calcium: 9.7 mg/dL (ref 8.7–10.3)
Chloride: 96 mmol/L (ref 96–106)
Creatinine, Ser: 0.81 mg/dL (ref 0.57–1.00)
Globulin, Total: 2.5 g/dL (ref 1.5–4.5)
Glucose: 88 mg/dL (ref 65–99)
Potassium: 4.5 mmol/L (ref 3.5–5.2)
Sodium: 137 mmol/L (ref 134–144)
Total Protein: 7.2 g/dL (ref 6.0–8.5)
eGFR: 79 mL/min/{1.73_m2} (ref >59)

## 2021-05-15 LAB — CBC
Hematocrit: 43.6 % (ref 34.0–46.6)
Hemoglobin: 14.3 g/dL (ref 11.1–15.9)
MCH: 32.6 pg (ref 26.6–33.0)
MCHC: 32.8 g/dL (ref 31.5–35.7)
MCV: 99 fL — ABNORMAL HIGH (ref 79–97)
Platelets: 196 10*3/uL (ref 150–450)
RBC: 4.39 x10E6/uL (ref 3.77–5.28)
RDW: 11.8 % (ref 11.7–15.4)
WBC: 4.5 10*3/uL (ref 3.4–10.8)

## 2021-05-15 LAB — LIPID PANEL
Chol/HDL Ratio: 2.6 ratio (ref 0.0–4.4)
Cholesterol, Total: 156 mg/dL (ref 100–199)
HDL: 59 mg/dL (ref >39)
LDL Chol Calc (NIH): 77 mg/dL (ref 0–99)
Triglycerides: 110 mg/dL (ref 0–149)
VLDL Cholesterol Cal: 20 mg/dL (ref 5–40)

## 2021-05-16 ENCOUNTER — Telehealth: Payer: Self-pay | Admitting: Family

## 2021-05-16 DIAGNOSIS — I10 Essential (primary) hypertension: Secondary | ICD-10-CM

## 2021-05-16 MED ORDER — NITROGLYCERIN 0.4 MG SL SUBL: 0.4000 mg | SUBLINGUAL_TABLET | SUBLINGUAL | 1 refills | Status: DC | PRN

## 2021-05-16 MED ORDER — BENAZEPRIL-HYDROCHLOROTHIAZIDE 20-12.5 MG PO TABS: 1.0000 | ORAL_TABLET | Freq: Every day | ORAL | 0 refills | Status: DC

## 2021-05-16 NOTE — Telephone Encounter (Signed)
Requested Prescriptions   Signed Prescriptions Disp Refills   nitroGLYCERIN (NITROSTAT) 0.4 MG SL tablet 25 tablet 1    Sig: Place 1 tablet (0.4 mg total) under the tongue every 5 (five) minutes as needed for chest pain. Maximum of 3 doses.    Authorizing Provider: Loel Dubonnet    Ordering User: NEWCOMER MCCLAIN, Salem Mastrogiovanni L   benazepril-hydrochlorthiazide (LOTENSIN HCT) 20-12.5 MG tablet 90 tablet 0    Sig: Take 1 tablet by mouth daily.    Authorizing Provider: Loel Dubonnet    Ordering User: Raelene Bott, Kele Withem L

## 2021-05-16 NOTE — Telephone Encounter (Signed)
*  STAT* If patient is at the pharmacy, call can be transferred to refill team.   1. Which medications need to be refilled? (please list name of each medication and dose if known) nitroglycerin, benazepril hct  2. Which pharmacy/location (including street and city if local pharmacy) [facility] [place]  3. Do they need a 30 day or 90 day supply? [place]

## 2021-05-17 ENCOUNTER — Encounter: Payer: Self-pay | Admitting: Gastroenterology

## 2021-05-17 ENCOUNTER — Other Ambulatory Visit: Payer: Self-pay

## 2021-05-17 ENCOUNTER — Ambulatory Visit (AMBULATORY_SURGERY_CENTER): Payer: Medicare HMO | Admitting: Gastroenterology

## 2021-05-17 VITALS — BP 116/67 | HR 75 | Temp 97.3°F | Resp 14 | Ht 62.0 in | Wt 188.0 lb

## 2021-05-17 DIAGNOSIS — I10 Essential (primary) hypertension: Secondary | ICD-10-CM | POA: Diagnosis not present

## 2021-05-17 DIAGNOSIS — D122 Benign neoplasm of ascending colon: Secondary | ICD-10-CM | POA: Diagnosis not present

## 2021-05-17 DIAGNOSIS — D123 Benign neoplasm of transverse colon: Secondary | ICD-10-CM

## 2021-05-17 DIAGNOSIS — Z800 Family history of malignant neoplasm of digestive organs: Secondary | ICD-10-CM

## 2021-05-17 DIAGNOSIS — R079 Chest pain, unspecified: Secondary | ICD-10-CM

## 2021-05-17 DIAGNOSIS — K2960 Other gastritis without bleeding: Secondary | ICD-10-CM | POA: Diagnosis not present

## 2021-05-17 DIAGNOSIS — Z86010 Personal history of colonic polyps: Secondary | ICD-10-CM | POA: Diagnosis not present

## 2021-05-17 DIAGNOSIS — I2510 Atherosclerotic heart disease of native coronary artery without angina pectoris: Secondary | ICD-10-CM | POA: Diagnosis not present

## 2021-05-17 DIAGNOSIS — K219 Gastro-esophageal reflux disease without esophagitis: Secondary | ICD-10-CM

## 2021-05-17 DIAGNOSIS — K3189 Other diseases of stomach and duodenum: Secondary | ICD-10-CM | POA: Diagnosis not present

## 2021-05-17 HISTORY — PX: COLONOSCOPY: SHX174

## 2021-05-17 HISTORY — PX: UPPER GASTROINTESTINAL ENDOSCOPY: SHX188

## 2021-05-17 MED ORDER — PANTOPRAZOLE SODIUM 40 MG PO TBEC: 40.0000 mg | DELAYED_RELEASE_TABLET | Freq: Every day | ORAL | 3 refills | Status: DC

## 2021-05-17 MED ORDER — SODIUM CHLORIDE 0.9 % IV SOLN: 500.0000 mL | Freq: Once | INTRAVENOUS | Status: DC

## 2021-05-17 NOTE — Progress Notes (Signed)
Called to room to assist during endoscopic procedure.  Patient ID and intended procedure confirmed with present staff. Received instructions for my participation in the procedure from the performing physician.  

## 2021-05-17 NOTE — Progress Notes (Signed)
Pt's states no medical or surgical changes since previsit or office visit.  [facility]

## 2021-05-17 NOTE — Progress Notes (Signed)
Per Dr. Fuller Plan pt to restart her PLAVIX on Monday, May 20, 2021.  Rx for Pantoprazole 40 mg daily refill x3 #90 best to take on empty stomach 20-30 minutes before food.  No problems noted in the recovery room. maw

## 2021-05-17 NOTE — Progress Notes (Signed)
Report to PACU, RN, vss, BBS= Clear.  

## 2021-05-17 NOTE — Patient Instructions (Addendum)
Handouts were given to your care partner on polyps, diverticulosis, gastritis and a hiatal hernia. Per Dr. Fuller Plan restart your PLAVIX in three days, on Monday, May 23rd. A prescription for PROTONIX ( Pantrprazole) 40 mg take daily.  It is best to take this on an empty stomach.  20-30 minutes before food.  Pick up rx from your pharmacy. You may resume your other current medications today. Await biopsy results.  May take 1-3 weeks to receive pathology results. Please call if any questions or concerns.     YOU HAD AN ENDOSCOPIC PROCEDURE TODAY AT [facility]:   Refer to the procedure report that was given to you for any specific questions about what was found during the examination.  If the procedure report does not answer your questions, please call your gastroenterologist to clarify.  If you requested that your care partner not be given the details of your procedure findings, then the procedure report has been included in a sealed envelope for you to review at your convenience later.  YOU SHOULD EXPECT: Some feelings of bloating in the abdomen. Passage of more gas than usual.  Walking can help get rid of the air that was put into your GI tract during the procedure and reduce the bloating. If you had a lower endoscopy (such as a colonoscopy or flexible sigmoidoscopy) you may notice spotting of blood in your stool or on the toilet paper. If you underwent a bowel prep for your procedure, you may not have a normal bowel movement for a few days.  Please Note:  You might notice some irritation and congestion in your nose or some drainage.  This is from the oxygen used during your procedure.  There is no need for concern and it should clear up in a day or so.  SYMPTOMS TO REPORT IMMEDIATELY:   Following lower endoscopy (colonoscopy or flexible sigmoidoscopy):  Excessive amounts of blood in the stool  Significant tenderness or worsening of abdominal pains  Swelling of the abdomen that is  new, acute  Fever of 100F or higher   Following upper endoscopy (EGD)  Vomiting of blood or coffee ground material  New chest pain or pain under the shoulder blades  Painful or persistently difficult swallowing  New shortness of breath  Fever of 100F or higher  Black, tarry-looking stools  For urgent or emergent issues, a gastroenterologist can be reached at any hour by calling [phone]. Do not use MyChart messaging for urgent concerns.    DIET:  We do recommend a small meal at first, but then you may proceed to your regular diet.  Drink plenty of fluids but you should avoid alcoholic beverages for 24 hours.  ACTIVITY:  You should plan to take it easy for the rest of today and you should NOT DRIVE or use heavy machinery until tomorrow (because of the sedation medicines used during the test).    FOLLOW UP: Our staff will call the number listed on your records 48-72 hours following your procedure to check on you and address any questions or concerns that you may have regarding the information given to you following your procedure. If we do not reach you, we will leave a message.  We will attempt to reach you two times.  During this call, we will ask if you have developed any symptoms of COVID 19. If you develop any symptoms (ie: fever, flu-like symptoms, shortness of breath, cough etc.) before then, please call [phone].  If you test  positive for Covid 19 in the 2 weeks post procedure, please call and report this information to [place].    If any biopsies were taken you will be contacted by phone or by letter within the next 1-3 weeks.  Please call us at [phone] if you have not heard about the biopsies in 3 weeks.    SIGNATURES/CONFIDENTIALITY: You and/or your care partner have signed paperwork which will be entered into your electronic medical record.  These signatures attest to the fact that that the information above on your After Visit Summary has been reviewed and is  understood.  Full responsibility of the confidentiality of this discharge information lies with you and/or your care-partner.

## 2021-05-17 NOTE — Op Note (Signed)
[place] Patient Name: Diane [name] Procedure Date: 05/17/2021 3:54 PM MRN: [id number] Endoscopist: Ladene Artist , MD Age: 70 Referring MD:  Date of Birth: 12/27/1951 Gender: Female Account #: [id number] Procedure:                Upper GI endoscopy Indications:              Unexplained chest pain Medicines:                Monitored Anesthesia Care Procedure:                Pre-Anesthesia Assessment:                           - Prior to the procedure, a History and Physical                            was performed, and patient medications and                            allergies were reviewed. The patient's tolerance of                            previous anesthesia was also reviewed. The risks                            and benefits of the procedure and the sedation                            options and risks were discussed with the patient.                            All questions were answered, and informed consent                            was obtained. Prior Anticoagulants: The patient has                            taken Plavix (clopidogrel), last dose was 5 days                            prior to procedure. ASA Grade Assessment: II - A                            patient with mild systemic disease. After reviewing                            the risks and benefits, the patient was deemed in                            satisfactory condition to undergo the procedure.                           After obtaining informed consent, the endoscope was  passed under direct vision. Throughout the                            procedure, the patient's blood pressure, pulse, and                            oxygen saturations were monitored continuously. The                            Endoscope was introduced through the mouth, and                            advanced to the second part of duodenum. The upper                            GI endoscopy was  accomplished without difficulty.                            The patient tolerated the procedure well. Scope In: Scope Out: Findings:                 The examined esophagus was normal.                           A few localized medium erosions with no bleeding                            and no stigmata of recent bleeding were found on                            the greater curvature of the stomach. Biopsies were                            taken with a cold forceps for histology.                           A small hiatal hernia was present.                           The exam of the stomach was otherwise normal.                           The duodenal bulb and second portion of the                            duodenum were normal. Complications:            No immediate complications. Estimated Blood Loss:     Estimated blood loss was minimal. Impression:               - Normal esophagus.                           - Erosive gastropathy with no bleeding and no  stigmata of recent bleeding. Biopsied.                           - Small hiatal hernia.                           - Normal duodenal bulb and second portion of the                            duodenum. Recommendation:           - Patient has a contact number available for                            emergencies. The signs and symptoms of potential                            delayed complications were discussed with the                            patient. Return to normal activities tomorrow.                            Written discharge instructions were provided to the                            patient.                           - Resume previous diet.                           - Continue present medications.                           - Resume Plavix (clopidogrel) at prior dose in 3                            days. Refer to managing physician for further                            adjustment of therapy.                            - Await pathology results.                           - Protonix (pantoprazole) 40 mg PO daily, 1 year of                            refills. Ladene Artist, MD 05/17/2021 4:35:58 PM This report has been signed electronically.

## 2021-05-17 NOTE — Op Note (Signed)
[place] Patient Name: Diane [name] Procedure Date: 05/17/2021 3:54 PM MRN: [id number] Endoscopist: Ladene Artist , MD Age: 70 Referring MD:  Date of Birth: 11/26/1951 Gender: Female Account #: [id number] Procedure:                Colonoscopy Indications:              High risk colon cancer surveillance: Personal                            history of sessile serrated colon polyp (10 mm or                            greater in size). Personal history of tubular                            adenomas. Family history of colon cancer, first                            degree relative < 60 Medicines:                Monitored Anesthesia Care Procedure:                Pre-Anesthesia Assessment:                           - Prior to the procedure, a History and Physical                            was performed, and patient medications and                            allergies were reviewed. The patient's tolerance of                            previous anesthesia was also reviewed. The risks                            and benefits of the procedure and the sedation                            options and risks were discussed with the patient.                            All questions were answered, and informed consent                            was obtained. Prior Anticoagulants: The patient has                            taken Plavix (clopidogrel), last dose was 5 days                            prior to procedure. ASA Grade Assessment: II - A  patient with mild systemic disease. After reviewing                            the risks and benefits, the patient was deemed in                            satisfactory condition to undergo the procedure.                           After obtaining informed consent, the colonoscope                            was passed under direct vision. Throughout the                            procedure, the patient's blood  pressure, pulse, and                            oxygen saturations were monitored continuously. The                            Olympus CF-[id number]) Colonoscope was                            introduced through the anus and advanced to the the                            cecum, identified by appendiceal orifice and                            ileocecal valve. The ileocecal valve, appendiceal                            orifice, and rectum were photographed. The quality                            of the bowel preparation was good. The colonoscopy                            was performed without difficulty. The patient                            tolerated the procedure well. Scope In: 4:01:24 PM Scope Out: 7:67:34 PM Scope Withdrawal Time: 0 hours 13 minutes 3 seconds  Total Procedure Duration: 0 hours 14 minutes 53 seconds  Findings:                 The perianal and digital rectal examinations were                            normal.                           Six sessile polyps were found in the transverse  colon (4) and ascending colon (2). The polyps were                            5 to 8 mm in size. These polyps were removed with a                            cold snare. Resection and retrieval were complete.                           A few small-mouthed diverticula were found in the                            left colon. There was no evidence of diverticular                            bleeding.                           The exam was otherwise without abnormality on                            direct and retroflexion views. Complications:            No immediate complications. Estimated blood loss:                            None. Estimated Blood Loss:     Estimated blood loss: none. Impression:               - Six 5 to 8 mm polyps in the transverse colon and                            in the ascending colon, removed with a cold snare.                             Resected and retrieved.                           - Mild diverticulosis in the left colon.                           - The examination was otherwise normal on direct                            and retroflexion views. Recommendation:           - Repeat colonoscopy in 3 - 5 years for                            surveillance based on pathology results.                           - Resume Plavix (clopidogrel) in 3 days at prior  dose. Refer to managing physician for further                            adjustment of therapy.                           - Patient has a contact number available for                            emergencies. The signs and symptoms of potential                            delayed complications were discussed with the                            patient. Return to normal activities tomorrow.                            Written discharge instructions were provided to the                            patient.                           - Resume previous diet.                           - Continue present medications.                           - Await pathology results. Ladene Artist, MD 05/17/2021 4:32:13 PM This report has been signed electronically.

## 2021-05-21 ENCOUNTER — Telehealth: Payer: Self-pay

## 2021-05-21 NOTE — Telephone Encounter (Signed)
  Follow up Call-  Call back number 05/17/2021  Post procedure Call Back phone  # [phone]  Permission to leave phone message Yes  Some recent data might be hidden     Patient questions:  Do you have a fever, pain , or abdominal swelling? No. Pain Score  0 *  Have you tolerated food without any problems? Yes.    Have you been able to return to your normal activities? Yes.    Do you have any questions about your discharge instructions: Diet   No. Medications  No. Follow up visit  No.  Do you have questions or concerns about your Care? No.  Actions: * If pain score is 4 or above: No action needed, pain <4.  1. Have you developed a fever since your procedure? no  2.   Have you had an respiratory symptoms (SOB or cough) since your procedure? no  3.   Have you tested positive for COVID 19 since your procedure no  4.   Have you had any family members/close contacts diagnosed with the COVID 19 since your procedure?  no   If yes to any of these questions please route to Joylene John, RN and Joella Prince, RN

## 2021-05-24 ENCOUNTER — Encounter: Payer: Self-pay | Admitting: Gastroenterology

## 2021-06-07 ENCOUNTER — Other Ambulatory Visit: Payer: Self-pay | Admitting: Internal Medicine

## 2021-06-07 DIAGNOSIS — I25119 Atherosclerotic heart disease of native coronary artery with unspecified angina pectoris: Secondary | ICD-10-CM

## 2021-06-24 ENCOUNTER — Other Ambulatory Visit: Payer: Self-pay | Admitting: Internal Medicine

## 2021-06-24 DIAGNOSIS — I25119 Atherosclerotic heart disease of native coronary artery with unspecified angina pectoris: Secondary | ICD-10-CM

## 2021-06-25 ENCOUNTER — Telehealth: Payer: Self-pay | Admitting: Internal Medicine

## 2021-06-25 MED ORDER — EZETIMIBE 10 MG PO TABS: 10.0000 mg | ORAL_TABLET | Freq: Every day | ORAL | 0 refills | Status: DC

## 2021-06-25 NOTE — Telephone Encounter (Signed)
*  STAT* If patient is at the pharmacy, call can be transferred to refill team.   1. Which medications need to be refilled? (please list name of each medication and dose if known) zetia  2. Which pharmacy/location (including street and city if local pharmacy) [facility] [place]  3. Do they need a 30 day or 90 day supply? [place]

## 2021-06-25 NOTE — Telephone Encounter (Signed)
Requested Prescriptions   Signed Prescriptions Disp Refills   ezetimibe (ZETIA) 10 MG tablet 90 tablet 0    Sig: Take 1 tablet (10 mg total) by mouth daily.    Authorizing Provider: END, CHRISTOPHER    Ordering User: NEWCOMER MCCLAIN, Maebry Obrien L  ;

## 2021-06-27 ENCOUNTER — Other Ambulatory Visit: Payer: Self-pay | Admitting: Family Medicine

## 2021-06-27 DIAGNOSIS — F411 Generalized anxiety disorder: Secondary | ICD-10-CM

## 2021-07-07 ENCOUNTER — Other Ambulatory Visit: Payer: Self-pay | Admitting: Family

## 2021-07-13 DIAGNOSIS — S8261XA Displaced fracture of lateral malleolus of right fibula, initial encounter for closed fracture: Secondary | ICD-10-CM | POA: Diagnosis not present

## 2021-07-13 DIAGNOSIS — Z6833 Body mass index (BMI) 33.0-33.9, adult: Secondary | ICD-10-CM | POA: Diagnosis not present

## 2021-07-13 DIAGNOSIS — S99911A Unspecified injury of right ankle, initial encounter: Secondary | ICD-10-CM | POA: Diagnosis not present

## 2021-07-14 ENCOUNTER — Ambulatory Visit (HOSPITAL_COMMUNITY): Payer: Medicare HMO

## 2021-07-14 DIAGNOSIS — S82401A Unspecified fracture of shaft of right fibula, initial encounter for closed fracture: Secondary | ICD-10-CM | POA: Diagnosis not present

## 2021-07-16 DIAGNOSIS — S8264XA Nondisplaced fracture of lateral malleolus of right fibula, initial encounter for closed fracture: Secondary | ICD-10-CM | POA: Diagnosis not present

## 2021-07-16 DIAGNOSIS — M25571 Pain in right ankle and joints of right foot: Secondary | ICD-10-CM | POA: Diagnosis not present

## 2021-07-23 DIAGNOSIS — S8264XD Nondisplaced fracture of lateral malleolus of right fibula, subsequent encounter for closed fracture with routine healing: Secondary | ICD-10-CM | POA: Diagnosis not present

## 2021-08-12 ENCOUNTER — Other Ambulatory Visit: Payer: Self-pay | Admitting: Gastroenterology

## 2021-08-12 DIAGNOSIS — K219 Gastro-esophageal reflux disease without esophagitis: Secondary | ICD-10-CM

## 2021-08-12 DIAGNOSIS — K2960 Other gastritis without bleeding: Secondary | ICD-10-CM

## 2021-08-15 ENCOUNTER — Other Ambulatory Visit: Payer: Self-pay | Admitting: Gastroenterology

## 2021-08-15 ENCOUNTER — Telehealth: Payer: Self-pay

## 2021-08-15 DIAGNOSIS — K219 Gastro-esophageal reflux disease without esophagitis: Secondary | ICD-10-CM

## 2021-08-15 DIAGNOSIS — S8264XD Nondisplaced fracture of lateral malleolus of right fibula, subsequent encounter for closed fracture with routine healing: Secondary | ICD-10-CM | POA: Diagnosis not present

## 2021-08-15 DIAGNOSIS — K2960 Other gastritis without bleeding: Secondary | ICD-10-CM

## 2021-08-15 NOTE — Telephone Encounter (Signed)
[facility] needs PA through [facility]. PA initiated through cover my meds.

## 2021-08-16 NOTE — Telephone Encounter (Signed)
Received fax from [facility] stating PA for pantoprazole was approved and the authorization is good until 12/28/2021

## 2021-08-19 ENCOUNTER — Telehealth: Payer: Self-pay | Admitting: Gastroenterology

## 2021-08-19 DIAGNOSIS — K219 Gastro-esophageal reflux disease without esophagitis: Secondary | ICD-10-CM

## 2021-08-19 DIAGNOSIS — K2960 Other gastritis without bleeding: Secondary | ICD-10-CM

## 2021-08-19 MED ORDER — PANTOPRAZOLE SODIUM 40 MG PO TBEC: 40.0000 mg | DELAYED_RELEASE_TABLET | Freq: Every day | ORAL | 3 refills | Status: AC

## 2021-08-19 NOTE — Telephone Encounter (Signed)
Prescription for pantoprazole has been sent to the pharmacy.

## 2021-08-19 NOTE — Telephone Encounter (Signed)
Inbound call from patient requesting refill for protonix.

## 2021-09-10 ENCOUNTER — Telehealth: Payer: Self-pay | Admitting: Nurse Practitioner

## 2021-09-10 NOTE — Progress Notes (Signed)
  Chronic Care Management   Outreach Note  09/10/2021 Name: Diane [name] MRN: [id number] DOB: 09-01-1951  Referred by: No primary care provider on file. Reason for referral : No chief complaint on file.   An unsuccessful telephone outreach was attempted today. The patient was referred to the pharmacist for assistance with care management and care coordination.   Follow Up Plan:   Tatjana Dellinger Upstream Scheduler

## 2021-09-12 DIAGNOSIS — S8264XD Nondisplaced fracture of lateral malleolus of right fibula, subsequent encounter for closed fracture with routine healing: Secondary | ICD-10-CM | POA: Diagnosis not present

## 2021-09-17 ENCOUNTER — Telehealth: Payer: Self-pay | Admitting: Nurse Practitioner

## 2021-09-17 NOTE — Progress Notes (Signed)
  Chronic Care Management   Outreach Note  09/17/2021 Name: Lovada Barwick MRN: [id number] DOB: [birth year]  Referred by: No primary care provider on file. Reason for referral : No chief complaint on file.   A second unsuccessful telephone outreach was attempted today. The patient was referred to pharmacist for assistance with care management and care coordination.  Follow Up Plan:   Tatjana Dellinger Upstream Scheduler

## 2021-09-18 ENCOUNTER — Encounter: Payer: Self-pay | Admitting: Internal Medicine

## 2021-09-18 ENCOUNTER — Ambulatory Visit: Payer: Medicare HMO | Admitting: Internal Medicine

## 2021-09-18 ENCOUNTER — Other Ambulatory Visit: Payer: Self-pay

## 2021-09-18 VITALS — BP 110/70 | HR 72 | Ht 62.0 in | Wt 188.0 lb

## 2021-09-18 DIAGNOSIS — I5032 Chronic diastolic (congestive) heart failure: Secondary | ICD-10-CM

## 2021-09-18 DIAGNOSIS — E785 Hyperlipidemia, unspecified: Secondary | ICD-10-CM

## 2021-09-18 DIAGNOSIS — I10 Essential (primary) hypertension: Secondary | ICD-10-CM | POA: Diagnosis not present

## 2021-09-18 DIAGNOSIS — I25118 Atherosclerotic heart disease of native coronary artery with other forms of angina pectoris: Secondary | ICD-10-CM | POA: Diagnosis not present

## 2021-09-18 MED ORDER — EZETIMIBE 10 MG PO TABS: 10.0000 mg | ORAL_TABLET | Freq: Every day | ORAL | 0 refills | Status: DC

## 2021-09-18 NOTE — Patient Instructions (Signed)
Medication Instructions:   Your physician recommends that you continue on your current medications as directed. Please refer to the Current Medication list given to you today.  *If you need a refill on your cardiac medications before your next appointment, please call your pharmacy*   Lab Work:  None ordered  Testing/Procedures:  None ordered   Follow-Up: At [facility], you and your health needs are our priority.  As part of our continuing mission to provide you with exceptional heart care, we have created designated Provider Care Teams.  These Care Teams include your primary Cardiologist (physician) and Advanced Practice Providers (APPs -  Physician Assistants and Nurse Practitioners) who all work together to provide you with the care you need, when you need it.  We recommend signing up for the patient portal called "MyChart".  Sign up information is provided on this After Visit Summary.  MyChart is used to connect with patients for Virtual Visits (Telemedicine).  Patients are able to view lab/test results, encounter notes, upcoming appointments, etc.  Non-urgent messages can be sent to your provider as well.   To learn more about what you can do with MyChart, go to [email or web address].    Your next appointment:   3 month(s)  The format for your next appointment:   In Person  Provider:   You may see Nelva Bush, MD or one of the following Advanced Practice Providers on your designated Care Team:   Murray Hodgkins, NP Christell Faith, PA-C Marrianne Mood, PA-C Cadence Crestline, [place]

## 2021-09-18 NOTE — Progress Notes (Signed)
Follow-up Outpatient Visit Date: 09/18/2021  Primary Care Provider: Pcp, No No address on file  Chief Complaint: Follow-up coronary artery disease and diastolic dysfunction  HPI:  Diane [name] is a 70 y.o. female with history of CAD status post PCI to proximal RCA (12/930), diastolic dysfunction, HTN, HLD, stroke, and GERD, who presents for follow-up of coronary artery disease and HFpEF.  She was last seen in our office in May by Diane Montana, NP, at which time she was doing relatively well.  No medication changes or additional testing were pursued at that time.  Today, Diane [name] reports that she has been under quite a bit of stress recently.  Her father is hospitalized in [place], [place], with heart failure and valve problems.  She also fractured her right ankle in July and remains in a removable brace.  This is making it somewhat challenging for her to drive.  She is hopeful that she will be able to avoid surgery on her ankle.  She noticed a little bit of shortness of breath yesterday after returning from the store.  She was upset at the time and thinks that may have contributed.  Before fracturing her ankle this summer, she was able to walk regularly without shortness of breath.  She also has not had any chest pain, palpitations, or lightheadedness.  She has experienced some swelling in the right foot and ankle since her fracture but otherwise no lower extremity edema.  --------------------------------------------------------------------------------------------------  Past Medical History:  Diagnosis Date   Arthritis    CAD (coronary artery disease)    a. 09/2013 Cath: LM nl, LAD 30p, 76m, LCX nl, RCA 30p, EF 55-60%; b. 04/2017 MV: EF>65%, apical defect ->breast attenuation. Sm. area of isch cannot be excluded; c. 04/2020 Cor CTA: Ca2+ = 704 (96%). Sev RCA/LAD dzs; d. 05/2020 Cath/PCI: LM nl, LAD 80m, 36m/d, LCX 20p, RCA 80p (DFR 0.88-->2.5x12 Synergy XD DES).   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 01/2013 Echo: EF 55-60%, no rwma, Gr1 DD, PASP 89mmHg; b. 04/2017 Echo: EF 65-70%, no rwma, Gr1 DD.   GERD (gastroesophageal reflux disease)    Hyperlipidemia    Hypertension    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/2015   basal cell   Stroke [facility]) 2006   deaf in right ear   Tubular adenoma of colon 2018   Past Surgical History:  Procedure Laterality Date   BREAST BIOPSY Left 1984   abcess   [place]   CHONDROPLASTY Left 01/05/2015   Procedure: CHONDROPLASTY;  Surgeon: Yvette Rack., MD;  Location: [facility];  Service: Orthopedics;  Laterality: Left;   COLONOSCOPY  05/17/2021   CORONARY STENT INTERVENTION N/A 06/01/2020   Procedure: CORONARY STENT INTERVENTION;  Surgeon: Nelva Bush, MD;  Location: [place] [facility];  Service: Cardiovascular;  Laterality: N/A;   INTRAVASCULAR PRESSURE WIRE/FFR STUDY N/A 06/01/2020   Procedure: INTRAVASCULAR PRESSURE WIRE/FFR STUDY;  Surgeon: Nelva Bush, MD;  Location: [place] [facility];  Service: Cardiovascular;  Laterality: N/A;   KNEE ARTHROSCOPY WITH MEDIAL MENISECTOMY Left 01/05/2015   Procedure: LEFT KNEE ARTHROSCOPY WITH MEDIAL AND LATERAL MENISECTOMY; DEBRIDEMENT LATERAL PATELLA FEMORAL ;  Surgeon: Yvette Rack., MD;  Location: [place];  Service: Orthopedics;  Laterality: Left;   LEFT HEART CATH AND CORONARY ANGIOGRAPHY N/A 06/01/2020   Procedure: LEFT HEART CATH AND CORONARY ANGIOGRAPHY;  Surgeon: Nelva Bush, MD;  Location: [place] [facility];  Service: Cardiovascular;  Laterality: N/A;   LEFT HEART CATHETERIZATION WITH CORONARY ANGIOGRAM N/A 10/28/2013   Procedure: LEFT HEART CATHETERIZATION WITH CORONARY ANGIOGRAM;  Surgeon: Larey Dresser, MD;  Location: [facility] CATH [facility];  Service:  Cardiovascular;  Laterality: N/A;   TOTAL KNEE ARTHROPLASTY Left 07/06/2015   Procedure: TOTAL KNEE ARTHROPLASTY;  Surgeon: Earlie Server, MD;  Location: [place];  Service: Orthopedics;  Laterality: Left;   TUBAL LIGATION     UPPER GASTROINTESTINAL ENDOSCOPY  05/17/2021     Current Meds  Medication Sig   albuterol (VENTOLIN HFA) 108 (90 Base) MCG/ACT inhaler Inhale 1-2 puffs into the lungs every 6 (six) hours as needed for wheezing or shortness of breath.   atorvastatin (LIPITOR) 80 MG tablet Take 1 tablet (80 mg total) by mouth daily.   benazepril-hydrochlorthiazide (LOTENSIN HCT) 20-12.5 MG tablet Take 1 tablet by mouth daily.   budesonide-formoterol (SYMBICORT) 80-4.5 MCG/ACT inhaler Inhale 2 puffs into the lungs 2 (two) times daily as needed.   butalbital-acetaminophen-caffeine (FIORICET WITH CODEINE) 50-325-40-30 MG capsule TAKE 1 CAPSULE EVERY 6 HOURS AS NEEDED FOR HEADACHE   cholecalciferol (VITAMIN D) 1000 UNITS tablet Take 1,000 Units by mouth daily.   clopidogrel (PLAVIX) 75 MG tablet TAKE 1 TABLET BY MOUTH EVERY DAY   diazepam (VALIUM) 5 MG tablet TAKE 1 TABLET (5 MG TOTAL) BY MOUTH EVERY 12 (TWELVE) HOURS AS NEEDED (ANXIETY, SLEEP). FOR ANXIETY   diltiazem (CARDIZEM CD) 180 MG 24 hr capsule TAKE 1 CAPSULE BY MOUTH EVERY DAY   ezetimibe (ZETIA) 10 MG tablet Take 1 tablet (10 mg total) by mouth daily.   Multiple Minerals-Vitamins (CAL-MAG-ZINC-D) TABS Take by mouth.   nitroGLYCERIN (NITROSTAT) 0.4 MG SL tablet PLACE 1 TABLET (0.4 MG TOTAL) UNDER THE TONGUE EVERY 5 (FIVE) MINUTES AS NEEDED FOR CHEST PAIN. MAXIMUM OF 3 DOSES.   pantoprazole (PROTONIX) 40 MG tablet Take 1 tablet (40 mg total) by mouth daily.   ranolazine (RANEXA) 500 MG 12 hr tablet TAKE 1 TABLET BY MOUTH TWICE A DAY   Rhubarb (ESTROVEN COMPLETE PO) Take 1 tablet by mouth daily.   sertraline (ZOLOFT) 100 MG tablet TAKE 1 TABLET BY MOUTH EVERY DAY   valACYclovir (VALTREX) 1000 MG tablet Take 500 mg by mouth 2 (two) times  daily as needed.    Allergies: Boniva [ibandronic acid], Fosamax [alendronate sodium], and Prolia [denosumab]  Social History   Tobacco Use   Smoking status: Never   Smokeless tobacco: Never   Tobacco comments:    LIVES WITH 2 SMOKERS   Vaping Use   Vaping Use: Never used  Substance Use Topics   Alcohol use: Not Currently    Comment: 1 glass of wine per month   Drug use: No    Family History  Problem Relation Age of Onset   Hypertension Mother    Stroke Mother    Breast cancer Mother    Stroke Father    Heart failure Father    Heart disease Father        CABG   Ovarian cancer Paternal Grandmother    Liver cancer Paternal Grandfather    Stroke Maternal Grandmother    Heart failure Maternal Grandmother    Heart failure Maternal Grandfather    Juvenile Diabetes Son  Healthy Son    Testicular cancer Son    Liver cancer Son    Colon cancer Son    Healthy Son    Hypertension Son    Healthy Son    Hypertension Son    Healthy Son    Healthy Daughter    Healthy Daughter    Healthy Daughter    Healthy Daughter    Stomach cancer Neg Hx    Rectal cancer Neg Hx    Esophageal cancer Neg Hx     Review of Systems: A 12-system review of systems was performed and was negative except as noted in the HPI.  --------------------------------------------------------------------------------------------------  Physical Exam: BP 110/70 (BP Location: Left Arm, Patient Position: Sitting, Cuff Size: Large)   Pulse 72   Ht 5\' 2"  (1.575 m)   Wt 188 lb (85.3 kg)   SpO2 98%   BMI 34.39 kg/m   General:  NAD. Neck: No JVD or HJR. Lungs: Clear to auscultation bilaterally without wheezes or crackles. Heart: Regular rate and rhythm with 1/6 systolic murmur.  No rubs or gallops. Abdomen: Soft, nontender, nondistended. Extremities: No lower extremity edema.  Rigid brace noted about right ankle/foot.  EKG: Normal sinus rhythm with borderline LVH and nonspecific ST/T changes.   Compared with 02/06/2021, nonspecific ST/T changes are now present.  Lab Results  Component Value Date   WBC 4.5 05/14/2021   HGB 14.3 05/14/2021   HCT 43.6 05/14/2021   MCV 99 (H) 05/14/2021   PLT 196 05/14/2021    Lab Results  Component Value Date   NA 137 05/14/2021   K 4.5 05/14/2021   CL 96 05/14/2021   CO2 25 05/14/2021   BUN 10 05/14/2021   CREATININE 0.81 05/14/2021   GLUCOSE 88 05/14/2021   ALT 32 05/14/2021    Lab Results  Component Value Date   CHOL 156 05/14/2021   HDL 59 05/14/2021   LDLCALC 77 05/14/2021   LDLDIRECT 59 07/13/2020   TRIG 110 05/14/2021   CHOLHDL 2.6 05/14/2021    --------------------------------------------------------------------------------------------------  ASSESSMENT AND PLAN: Coronary artery disease with stable angina: Ms. Leisure has not had any recurrent angina following her PCI to the RCA in 05/2020.  We will continue her current regimen of clopidogrel, atorvastatin, and ezetimibe for secondary prevention as well as antianginal therapy with diltiazem and ranolazine.  Diastolic dysfunction: Ms. Griffo appears euvolemic with NYHA class II symptoms.  Defer medication changes at this time.  Hypertension: BP well controlled today.  No medication changes.  Hyperlipidemia: LDL just above goal on last check in 04/2021 with ongoing atorvastatin and ezetimibe use.  Recommend continue working on lifestyle modifications to help achieve LDL less than 70.  Follow-up: Return to clinic in 3 months.  Nelva Bush, MD 09/18/2021 1:53 PM

## 2021-09-20 ENCOUNTER — Ambulatory Visit
Admission: RE | Admit: 2021-09-20 | Discharge: 2021-09-20 | Disposition: A | Discharge status: Home / Self Care | Payer: Medicare HMO | Source: Ambulatory Visit | Attending: Family Medicine | Admitting: Family Medicine

## 2021-09-20 ENCOUNTER — Encounter: Payer: Self-pay | Admitting: Internal Medicine

## 2021-09-20 ENCOUNTER — Other Ambulatory Visit: Payer: Self-pay

## 2021-09-20 DIAGNOSIS — Z780 Asymptomatic menopausal state: Secondary | ICD-10-CM | POA: Diagnosis not present

## 2021-09-20 DIAGNOSIS — M8589 Other specified disorders of bone density and structure, multiple sites: Secondary | ICD-10-CM | POA: Diagnosis not present

## 2021-09-20 DIAGNOSIS — M810 Age-related osteoporosis without current pathological fracture: Secondary | ICD-10-CM

## 2021-09-23 ENCOUNTER — Telehealth: Payer: Self-pay

## 2021-09-23 NOTE — Chronic Care Management (AMB) (Signed)
  Chronic Care Management   Outreach Note  09/23/2021 Name: Diane [name] MRN: [id number] DOB: January 19, 1951  Referred by: Pcp, No Reason for referral : No chief complaint on file.   Third unsuccessful telephone outreach was attempted today. The patient was referred to the pharmacist for assistance with care management and care coordination.   Follow Up Plan:   Tatjana Dellinger Upstream Scheduler

## 2021-09-26 ENCOUNTER — Other Ambulatory Visit: Payer: Self-pay

## 2021-09-27 ENCOUNTER — Ambulatory Visit: Payer: Medicare HMO | Admitting: Nurse Practitioner

## 2021-09-30 ENCOUNTER — Telehealth: Payer: Self-pay | Admitting: Nurse Practitioner

## 2021-09-30 ENCOUNTER — Other Ambulatory Visit: Payer: Self-pay | Admitting: Family

## 2021-09-30 ENCOUNTER — Telehealth: Payer: Self-pay

## 2021-09-30 DIAGNOSIS — F411 Generalized anxiety disorder: Secondary | ICD-10-CM

## 2021-09-30 MED ORDER — DIAZEPAM 5 MG PO TABS: 5.0000 mg | ORAL_TABLET | Freq: Two times a day (BID) | ORAL | 2 refills | Status: DC | PRN

## 2021-09-30 MED ORDER — BUTALBITAL-APAP-CAFF-COD 50-325-40-30 MG PO CAPS: ORAL_CAPSULE | ORAL | 1 refills | Status: DC

## 2021-09-30 NOTE — Telephone Encounter (Signed)
Patient notified and verbalized understanding. 

## 2021-09-30 NOTE — Telephone Encounter (Signed)
Refill request for:  Diazapam 5 mg LR 06/28/21, #60, 2 rfs  Fioricat 50-325-40-30 mg LR 04/24/21, #30, 1 rf LOV 04/26/21  (Dr Bryan Lemma) [place] 11/13/21 Baldo Ash)  Please review and advise.  Thanks.  Dm/cma

## 2021-09-30 NOTE — Telephone Encounter (Signed)
Pt called because she has an appt with Baldo Ash for [facility] on 11/13/21 and said she is almost out of medication and the pharmacy is sending over a refill request. She did have an appt for 09/27/21 but wasn't able to leave her driveway because a fallen over tree. She wanted to know if she can have her meds filled until appt date. Please advise butalbital-acetaminophen-caffeine (FIORICET WITH CODEINE) 50-325-40-30 MG capsule diazepam (VALIUM) 5 MG tablet

## 2021-09-30 NOTE — Telephone Encounter (Signed)
Please advise 

## 2021-10-04 ENCOUNTER — Other Ambulatory Visit: Payer: Self-pay

## 2021-10-07 ENCOUNTER — Ambulatory Visit: Payer: Medicare HMO | Admitting: Nurse Practitioner

## 2021-10-10 ENCOUNTER — Other Ambulatory Visit: Payer: Self-pay

## 2021-10-10 DIAGNOSIS — E785 Hyperlipidemia, unspecified: Secondary | ICD-10-CM

## 2021-10-10 MED ORDER — ATORVASTATIN CALCIUM 80 MG PO TABS: 80.0000 mg | ORAL_TABLET | Freq: Every day | ORAL | 0 refills | Status: DC

## 2021-10-14 ENCOUNTER — Other Ambulatory Visit: Payer: Self-pay | Admitting: Internal Medicine

## 2021-11-13 ENCOUNTER — Encounter: Payer: Self-pay | Admitting: Nurse Practitioner

## 2021-11-13 ENCOUNTER — Other Ambulatory Visit: Payer: Self-pay

## 2021-11-13 ENCOUNTER — Ambulatory Visit (INDEPENDENT_AMBULATORY_CARE_PROVIDER_SITE_OTHER): Payer: Medicare HMO | Admitting: Nurse Practitioner

## 2021-11-13 VITALS — BP 106/60 | HR 70 | Temp 97.4°F | Wt 187.9 lb

## 2021-11-13 DIAGNOSIS — G8929 Other chronic pain: Secondary | ICD-10-CM | POA: Diagnosis not present

## 2021-11-13 DIAGNOSIS — E785 Hyperlipidemia, unspecified: Secondary | ICD-10-CM

## 2021-11-13 DIAGNOSIS — R519 Headache, unspecified: Secondary | ICD-10-CM

## 2021-11-13 DIAGNOSIS — F339 Major depressive disorder, recurrent, unspecified: Secondary | ICD-10-CM

## 2021-11-13 DIAGNOSIS — Z23 Encounter for immunization: Secondary | ICD-10-CM | POA: Diagnosis not present

## 2021-11-13 DIAGNOSIS — F411 Generalized anxiety disorder: Secondary | ICD-10-CM

## 2021-11-13 MED ORDER — SERTRALINE HCL 100 MG PO TABS: 100.0000 mg | ORAL_TABLET | Freq: Every day | ORAL | 1 refills | Status: DC

## 2021-11-13 MED ORDER — AMITRIPTYLINE HCL 25 MG PO TABS: 25.0000 mg | ORAL_TABLET | Freq: Every day | ORAL | 5 refills | Status: DC

## 2021-11-13 MED ORDER — BUTALBITAL-APAP-CAFFEINE 50-325-40 MG PO TABS: 1.0000 | ORAL_TABLET | Freq: Three times a day (TID) | ORAL | 0 refills | Status: DC | PRN

## 2021-11-13 MED ORDER — ATORVASTATIN CALCIUM 80 MG PO TABS: 80.0000 mg | ORAL_TABLET | Freq: Every day | ORAL | 0 refills | Status: DC

## 2021-11-13 NOTE — Assessment & Plan Note (Addendum)
Reports 2-3 migraine episodes per month Associated with light sensitivity Denies any neurologic deficit with headaches. Ongoing for years, improved in frequency. Unable to tolerate imitrex in past (elevated BP per patient) Improves with fioricet-codeine and rest. Advised about the risk of sedation and dependence when combining an opioid with benzodiazepine. I informed her I will not refill fioricet with codeine. Also informed her I do not recommend use of an opioid for headache management. PMP database review: fioricet filled 10/28/21, 10/01/21, 06/08/21. She also filled other opioids from other provider: hydrocodone and tramadol.  last brain imaging 2014 due to acute CVA MRI  Changed rx to fioricet.  Add elavil at hs Consider ref to neurology if no improvement F/up in 34month

## 2021-11-13 NOTE — Assessment & Plan Note (Signed)
Repeat lipid panel Current use of zetia and atorvastatin Maintain med doses

## 2021-11-13 NOTE — Assessment & Plan Note (Addendum)
Chronic, waxing and waning, exacerbated by deather of husband in past and recent father's poor heath. Minimal improvement with zoloft 100mg  and valium 5mg  BID. No previous medication used Hx of PTSD and accidental overdose in 2020 Has difficulty with insomnia, no improvement with trazodone in past Previous appt with counselor but d/c due to covid pandemic. PMP database reviewed: valium 5mg  prescribed since 2020, last 3 refills: 10/29/21, 01/01/21, 08/27/21  Maintain zoloft dose Add elavil at hs due to hx of chronic pain, insomnia and chronic headache. Adverse about possible side effects. Advised to schedule appt with counselor. Provided contact information F/up in 79month

## 2021-11-13 NOTE — Assessment & Plan Note (Deleted)
Repeat lipid panel Current use of zetia and atorvastatin Maintain med doses

## 2021-11-13 NOTE — Patient Instructions (Addendum)
Schedule appt with Marya Amsler: [phone]  Maintain zoloft 100mg  in AM Start elavil at bedtime. Refilled fioricet (without codeine).  Go to lab for blood draw.  F/up in 19month

## 2021-11-13 NOTE — Progress Notes (Signed)
Subjective:  Patient ID: Diane [name], female    DOB: Apr 28, 1951  Age: 70 y.o. MRN: [id number]  CC: Establish Care (TOC-Dr. C/Medication refills needed. Pt states she would also like to discuss plans for medications, should she be coming off of any or will they be something she will have to continue long term. )  HPI  Depression, recurrent (HCC) Chronic, waxing and waning, exacerbated by deather of husband in past and recent father's poor heath. Minimal improvement with zoloft 100mg  and valium 5mg  BID. No previous medication used Hx of PTSD and accidental overdose in 2020 Has difficulty with insomnia, no improvement with trazodone in past Previous appt with counselor but d/c due to covid pandemic. PMP database reviewed: valium 5mg  prescribed since 2020, last 3 refills: 10/29/21, 01/01/21, 08/27/21  Maintain zoloft dose Add elavil at hs due to hx of chronic pain, insomnia and chronic headache. Adverse about possible side effects. Advised to schedule appt with counselor. Provided contact information F/up in 53month  Hyperlipidemia Repeat lipid panel Current use of zetia and atorvastatin Maintain med doses  Chronic headaches Reports 2-3 migraine episodes per month Associated with light sensitivity Denies any neurologic deficit with headaches. Ongoing for years, improved in frequency. Unable to tolerate imitrex in past (elevated BP per patient) Improves with fioricet-codeine and rest. Advised about the risk of sedation and dependence when combining an opioid with benzodiazepine. I informed her I will not refill fioricet with codeine. Also informed her I do not recommend use of an opioid for headache management. PMP database review: fioricet filled 10/28/21, 10/01/21, 06/08/21. She also filled other opioids from other provider: hydrocodone and tramadol.  last brain imaging 2014 due to acute CVA MRI  Changed rx to fioricet.  Add elavil at hs Consider ref to neurology if no  improvement F/up in 10month  Reviewed past Medical, Social and Family history today.  Outpatient Medications Prior to Visit  Medication Sig Dispense Refill   albuterol (VENTOLIN HFA) 108 (90 Base) MCG/ACT inhaler Inhale 1-2 puffs into the lungs every 6 (six) hours as needed for wheezing or shortness of breath.     benazepril-hydrochlorthiazide (LOTENSIN HCT) 20-12.5 MG tablet Take 1 tablet by mouth daily. 90 tablet 0   budesonide-formoterol (SYMBICORT) 80-4.5 MCG/ACT inhaler Inhale 2 puffs into the lungs 2 (two) times daily as needed.     cholecalciferol (VITAMIN D) 1000 UNITS tablet Take 1,000 Units by mouth daily.     clopidogrel (PLAVIX) 75 MG tablet TAKE 1 TABLET BY MOUTH EVERY DAY 90 tablet 0   diazepam (VALIUM) 5 MG tablet Take 1 tablet (5 mg total) by mouth every 12 (twelve) hours as needed (anxiety, sleep). for anxiety 60 tablet 2   diltiazem (CARDIZEM CD) 180 MG 24 hr capsule TAKE 1 CAPSULE BY MOUTH EVERY DAY 90 capsule 3   ezetimibe (ZETIA) 10 MG tablet Take 1 tablet (10 mg total) by mouth daily. 90 tablet 0   Multiple Minerals-Vitamins (CAL-MAG-ZINC-D) TABS Take by mouth.     nitroGLYCERIN (NITROSTAT) 0.4 MG SL tablet PLACE 1 TABLET (0.4 MG TOTAL) UNDER THE TONGUE EVERY 5 (FIVE) MINUTES AS NEEDED FOR CHEST PAIN. MAXIMUM OF 3 DOSES. 25 tablet 3   pantoprazole (PROTONIX) 40 MG tablet Take 1 tablet (40 mg total) by mouth daily. 90 tablet 3   ranolazine (RANEXA) 500 MG 12 hr tablet TAKE 1 TABLET BY MOUTH TWICE A DAY 180 tablet 3   Rhubarb (ESTROVEN COMPLETE PO) Take 1 tablet by mouth daily.  valACYclovir (VALTREX) 1000 MG tablet Take 500 mg by mouth 2 (two) times daily as needed.     atorvastatin (LIPITOR) 80 MG tablet Take 1 tablet (80 mg total) by mouth daily. 90 tablet 0   butalbital-acetaminophen-caffeine (FIORICET WITH CODEINE) 50-325-40-30 MG capsule TAKE 1 CAPSULE EVERY 6 HOURS AS NEEDED FOR HEADACHE 30 capsule 1   sertraline (ZOLOFT) 100 MG tablet TAKE 1 TABLET BY MOUTH EVERY  DAY 90 tablet 3   No facility-administered medications prior to visit.    ROS See HPI  Objective:  BP 106/60 (BP Location: Left Arm, Patient Position: Sitting, Cuff Size: Normal)   Pulse 70   Temp (!) 97.4 F (36.3 C) (Temporal)   Wt 187 lb 14.4 oz (85.2 kg)   SpO2 97%   BMI 34.37 kg/m   Physical Exam Vitals reviewed.  Cardiovascular:     Rate and Rhythm: Normal rate.     Pulses: Normal pulses.  Pulmonary:     Effort: Pulmonary effort is normal.  Neurological:     Mental Status: She is alert.  Psychiatric:        Attention and Perception: Attention normal.        Mood and Affect: Mood is anxious.        Speech: Speech normal.        Behavior: Behavior is cooperative.        Thought Content: Thought content normal.        Cognition and Memory: Cognition and memory normal.   Assessment & Plan:  This visit occurred during the SARS-CoV-2 public health emergency.  Safety protocols were in place, including screening questions prior to the visit, additional usage of staff PPE, and extensive cleaning of exam room while observing appropriate contact time as indicated for disinfecting solutions.   Diane [name] was seen today for establish care.  Diagnoses and all orders for this visit:  Depression, recurrent ([place]) -     Comprehensive metabolic panel -     TSH -     sertraline (ZOLOFT) 100 MG tablet; Take 1 tablet (100 mg total) by mouth daily. -     amitriptyline (ELAVIL) 25 MG tablet; Take 1 tablet (25 mg total) by mouth at bedtime.  Flu vaccine need -     Flu Vaccine QUAD High Dose(Fluad)  Generalized anxiety disorder -     Comprehensive metabolic panel -     TSH -     sertraline (ZOLOFT) 100 MG tablet; Take 1 tablet (100 mg total) by mouth daily. -     amitriptyline (ELAVIL) 25 MG tablet; Take 1 tablet (25 mg total) by mouth at bedtime.  Chronic nonintractable headache, unspecified headache type -     amitriptyline (ELAVIL) 25 MG tablet; Take 1 tablet (25 mg total) by  mouth at bedtime. -     butalbital-acetaminophen-caffeine (FIORICET) 50-325-40 MG tablet; Take 1 tablet by mouth every 8 (eight) hours as needed for headache or migraine.  Hyperlipidemia LDL goal <70 -     atorvastatin (LIPITOR) 80 MG tablet; Take 1 tablet (80 mg total) by mouth daily. -     Comprehensive metabolic panel -     Lipid panel  Problem List Items Addressed This Visit       Other   Chronic headaches    Reports 2-3 migraine episodes per month Associated with light sensitivity Denies any neurologic deficit with headaches. Ongoing for years, improved in frequency. Unable to tolerate imitrex in past (elevated BP per patient) Improves with fioricet-codeine and  rest. Advised about the risk of sedation and dependence when combining an opioid with benzodiazepine. I informed her I will not refill fioricet with codeine. Also informed her I do not recommend use of an opioid for headache management. PMP database review: fioricet filled 10/28/21, 10/01/21, 06/08/21. She also filled other opioids from other provider: hydrocodone and tramadol.  last brain imaging 2014 due to acute CVA MRI  Changed rx to fioricet.  Add elavil at hs Consider ref to neurology if no improvement F/up in 60month      Relevant Medications   sertraline (ZOLOFT) 100 MG tablet   amitriptyline (ELAVIL) 25 MG tablet   butalbital-acetaminophen-caffeine (FIORICET) 50-325-40 MG tablet   Depression, recurrent (HCC) - Primary    Chronic, waxing and waning, exacerbated by deather of husband in past and recent father's poor heath. Minimal improvement with zoloft 100mg  and valium 5mg  BID. No previous medication used Hx of PTSD and accidental overdose in 2020 Has difficulty with insomnia, no improvement with trazodone in past Previous appt with counselor but d/c due to covid pandemic. PMP database reviewed: valium 5mg  prescribed since 2020, last 3 refills: 10/29/21, 01/01/21, 08/27/21  Maintain zoloft dose Add  elavil at hs due to hx of chronic pain, insomnia and chronic headache. Adverse about possible side effects. Advised to schedule appt with counselor. Provided contact information F/up in 17month      Relevant Medications   sertraline (ZOLOFT) 100 MG tablet   amitriptyline (ELAVIL) 25 MG tablet   Other Relevant Orders   Comprehensive metabolic panel   TSH   Generalized anxiety disorder   Relevant Medications   sertraline (ZOLOFT) 100 MG tablet   amitriptyline (ELAVIL) 25 MG tablet   Other Relevant Orders   Comprehensive metabolic panel   TSH   Hyperlipidemia    Repeat lipid panel Current use of zetia and atorvastatin Maintain med doses      Relevant Medications   atorvastatin (LIPITOR) 80 MG tablet   Other Visit Diagnoses     Flu vaccine need       Relevant Orders   Flu Vaccine QUAD High Dose(Fluad) (Completed)       Follow-up: Return in about 4 weeks (around 12/11/2021) for anxiety, depression and headcahe.  Wilfred Lacy, NP

## 2021-11-14 LAB — COMPREHENSIVE METABOLIC PANEL
ALT: 27 U/L (ref 0–35)
AST: 19 U/L (ref 0–37)
Albumin: 4.3 g/dL (ref 3.5–5.2)
Alkaline Phosphatase: 62 U/L (ref 39–117)
BUN: 14 mg/dL (ref 6–23)
CO2: 32 mEq/L (ref 19–32)
Calcium: 9.5 mg/dL (ref 8.4–10.5)
Chloride: 99 mEq/L (ref 96–112)
Creatinine, Ser: 0.83 mg/dL (ref 0.40–1.20)
GFR: 72.04 mL/min (ref >60.00)
Glucose, Bld: 88 mg/dL (ref 70–99)
Potassium: 4.2 mEq/L (ref 3.5–5.1)
Sodium: 138 mEq/L (ref 135–145)
Total Bilirubin: 0.5 mg/dL (ref 0.2–1.2)
Total Protein: 6.7 g/dL (ref 6.0–8.3)

## 2021-11-14 LAB — LIPID PANEL
Cholesterol: 135 mg/dL (ref 0–200)
HDL: 73.4 mg/dL (ref >39.00)
LDL Cholesterol: 43 mg/dL (ref 0–99)
NonHDL: 61.79
Total CHOL/HDL Ratio: 2
Triglycerides: 95 mg/dL (ref 0.0–149.0)
VLDL: 19 mg/dL (ref 0.0–40.0)

## 2021-11-14 LAB — TSH: TSH: 1.09 u[IU]/mL (ref 0.35–5.50)

## 2021-11-14 NOTE — Progress Notes (Signed)
Normal results

## 2021-11-17 ENCOUNTER — Other Ambulatory Visit: Payer: Self-pay | Admitting: Internal Medicine

## 2021-11-17 DIAGNOSIS — I25119 Atherosclerotic heart disease of native coronary artery with unspecified angina pectoris: Secondary | ICD-10-CM

## 2021-11-29 ENCOUNTER — Other Ambulatory Visit: Payer: Self-pay | Admitting: Internal Medicine

## 2021-12-06 ENCOUNTER — Other Ambulatory Visit: Payer: Self-pay | Admitting: Nurse Practitioner

## 2021-12-06 DIAGNOSIS — G8929 Other chronic pain: Secondary | ICD-10-CM

## 2021-12-06 DIAGNOSIS — F339 Major depressive disorder, recurrent, unspecified: Secondary | ICD-10-CM

## 2021-12-06 DIAGNOSIS — F411 Generalized anxiety disorder: Secondary | ICD-10-CM

## 2021-12-07 ENCOUNTER — Other Ambulatory Visit: Payer: Self-pay | Admitting: Internal Medicine

## 2021-12-09 ENCOUNTER — Telehealth: Payer: Self-pay | Admitting: Nurse Practitioner

## 2021-12-09 NOTE — Telephone Encounter (Signed)
Pt called and said she had to cancel appt for 12/11/21 because she is in [place] still because her dad is dying. She said the appt was following up about the new medication she was put on and she wanted to let charlotte know that it is working really well. She said she will call back when she gets back to reschedule.

## 2021-12-11 ENCOUNTER — Ambulatory Visit: Payer: Medicare HMO | Admitting: Nurse Practitioner

## 2021-12-25 ENCOUNTER — Ambulatory Visit: Payer: Medicare HMO | Admitting: Internal Medicine

## 2021-12-30 ENCOUNTER — Other Ambulatory Visit: Payer: Self-pay | Admitting: Internal Medicine

## 2021-12-30 ENCOUNTER — Other Ambulatory Visit: Payer: Self-pay | Admitting: Family

## 2021-12-30 DIAGNOSIS — F411 Generalized anxiety disorder: Secondary | ICD-10-CM

## 2021-12-30 DIAGNOSIS — I25119 Atherosclerotic heart disease of native coronary artery with unspecified angina pectoris: Secondary | ICD-10-CM

## 2021-12-31 ENCOUNTER — Other Ambulatory Visit: Payer: Self-pay

## 2021-12-31 ENCOUNTER — Telehealth: Payer: Self-pay | Admitting: Internal Medicine

## 2021-12-31 MED ORDER — EZETIMIBE 10 MG PO TABS: 10.0000 mg | ORAL_TABLET | Freq: Every day | ORAL | 0 refills | Status: DC

## 2021-12-31 NOTE — Telephone Encounter (Signed)
ezetimibe (ZETIA) 10 MG tablet 90 tablet 0 12/31/2021    Sig - Route: Take 1 tablet (10 mg total) by mouth daily. - Oral   Sent to pharmacy as: ezetimibe (ZETIA) 10 MG tablet   E-Prescribing Status: Receipt confirmed by pharmacy (12/31/2021 12:11 PM EST)    Pharmacy  [id number] - [place], [place] - [address]

## 2021-12-31 NOTE — Telephone Encounter (Signed)
°*  STAT* If patient is at the pharmacy, call can be transferred to refill team.   1. Which medications need to be refilled? (please list name of each medication and dose if known) zetia 10 mg po q d   2. Which pharmacy/location (including street and city if local pharmacy) is medication to be sent to? [place] [facility]   3. Do they need a 30 day or 90 day supply? [place]

## 2022-01-04 ENCOUNTER — Other Ambulatory Visit: Payer: Self-pay | Admitting: Nurse Practitioner

## 2022-01-04 DIAGNOSIS — F411 Generalized anxiety disorder: Secondary | ICD-10-CM

## 2022-01-04 DIAGNOSIS — G8929 Other chronic pain: Secondary | ICD-10-CM

## 2022-01-04 DIAGNOSIS — F339 Major depressive disorder, recurrent, unspecified: Secondary | ICD-10-CM

## 2022-01-04 DIAGNOSIS — R519 Other chronic pain: Secondary | ICD-10-CM

## 2022-01-06 NOTE — Telephone Encounter (Signed)
Chart supports rx refill Last ov: 11/13/21 Last refill: 12/11/21

## 2022-01-08 ENCOUNTER — Other Ambulatory Visit: Payer: Self-pay | Admitting: Nurse Practitioner

## 2022-01-08 DIAGNOSIS — G8929 Other chronic pain: Secondary | ICD-10-CM

## 2022-01-08 DIAGNOSIS — R519 Headache, unspecified: Secondary | ICD-10-CM

## 2022-01-14 ENCOUNTER — Ambulatory Visit: Payer: Medicare Other

## 2022-02-07 ENCOUNTER — Telehealth: Payer: Self-pay | Admitting: Nurse Practitioner

## 2022-02-07 DIAGNOSIS — F411 Generalized anxiety disorder: Secondary | ICD-10-CM

## 2022-02-07 NOTE — Telephone Encounter (Signed)
What is the name of the medication? diazepam (VALIUM) 5 MG tablet [[id number]]   Have you contacted your pharmacy to request a refill? Yes, pt is requesting a refill.  Which pharmacy would you like this sent to? [id number] - [place], [place] - [address]  [address], [place] [address]  Phone:  [phone]  Fax:  [phone]  DEA #:  [id number]   Patient notified that their request is being sent to the clinical staff for review and that they should receive a call once it is complete. If they do not receive a call within 72 hours they can check with their pharmacy or our office.

## 2022-02-10 MED ORDER — DIAZEPAM 5 MG PO TABS: 5.0000 mg | ORAL_TABLET | Freq: Two times a day (BID) | ORAL | 0 refills | Status: DC | PRN

## 2022-02-10 NOTE — Telephone Encounter (Signed)
Pt called to follow up on this and said she has been without for 3 days, she said she called originally about this last Monday but hasnt heard anything back. I dont see any notes from last Monday, I told her I would send back as urgent. Please advise

## 2022-02-11 NOTE — Telephone Encounter (Signed)
Patient notified and verbalized understanding Appointment scheduled for 04/08/22

## 2022-02-13 ENCOUNTER — Other Ambulatory Visit: Payer: Self-pay | Admitting: Nurse Practitioner

## 2022-02-13 DIAGNOSIS — R519 Headache, unspecified: Secondary | ICD-10-CM

## 2022-02-13 DIAGNOSIS — G8929 Other chronic pain: Secondary | ICD-10-CM

## 2022-02-13 MED ORDER — BUTALBITAL-APAP-CAFFEINE 50-325-40 MG PO TABS: 1.0000 | ORAL_TABLET | Freq: Three times a day (TID) | ORAL | 0 refills | Status: DC | PRN

## 2022-02-13 NOTE — Telephone Encounter (Signed)
Chart supports Rx  Last seen 11/13/21 Next OV 04/08/22  PMP database last 3 fill dates are  10/28/21 10/01/21 06/08/21

## 2022-02-20 ENCOUNTER — Ambulatory Visit: Payer: Medicare HMO | Admitting: Internal Medicine

## 2022-02-20 NOTE — Progress Notes (Unsigned)
Follow-up Outpatient Visit Date: 02/20/2022  Primary Care Provider: Flossie Buffy, NP [place] [id number]  Chief Complaint: ***  HPI:  Diane [name] is a 71 y.o. female with history of CAD status post PCI to proximal RCA (03/6269), diastolic dysfunction, HTN, HLD, stroke, and GERD, who presents for follow-up of coronary artery disease and HFpEF.  I last saw her in 08/2021, at which time she was under stress related to her father being hospitalized in [place] with heart failure.  She had also fractured her ankle a few months earlier and remained in a brace.  She had been without cardiac symptoms.  --------------------------------------------------------------------------------------------------  Past Medical History:  Diagnosis Date   Arthritis    CAD (coronary artery disease)    a. 09/2013 Cath: LM nl, LAD 30p, 106m, LCX nl, RCA 30p, EF 55-60%; b. 04/2017 MV: EF>65%, apical defect ->breast attenuation. Sm. area of isch cannot be excluded; c. 04/2020 Cor CTA: Ca2+ = 704 (96%). Sev RCA/LAD dzs; d. 05/2020 Cath/PCI: LM nl, LAD 65m, 67m/d, LCX 20p, RCA 80p (DFR 0.88-->2.5x12 Synergy XD DES).   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 01/2013 Echo: EF 55-60%, no rwma, Gr1 DD, PASP 72mmHg; b. 04/2017 Echo: EF 65-70%, no rwma, Gr1 DD.   GERD (gastroesophageal reflux disease)    Hyperlipidemia    Hypertension    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/2015   basal cell   Stroke [facility]) 2006   deaf in right ear   Tubular adenoma of colon 2018   Past Surgical History:  Procedure Laterality Date   BREAST BIOPSY Left 1984   abcess   [place]   CHONDROPLASTY Left 01/05/2015   Procedure: CHONDROPLASTY;  Surgeon: Yvette Rack., MD;  Location: [place];  Service: Orthopedics;  Laterality: Left;   COLONOSCOPY  05/17/2021   CORONARY STENT INTERVENTION N/A 06/01/2020   Procedure: CORONARY STENT INTERVENTION;  Surgeon: Nelva Bush, MD;  Location: [place] CV LAB;  Service: Cardiovascular;  Laterality: N/A;   INTRAVASCULAR PRESSURE WIRE/FFR STUDY N/A 06/01/2020   Procedure: INTRAVASCULAR PRESSURE WIRE/FFR STUDY;  Surgeon: Nelva Bush, MD;  Location: [place] CV LAB;  Service: Cardiovascular;  Laterality: N/A;   KNEE ARTHROSCOPY WITH MEDIAL MENISECTOMY Left 01/05/2015   Procedure: LEFT KNEE ARTHROSCOPY WITH MEDIAL AND LATERAL MENISECTOMY; DEBRIDEMENT LATERAL PATELLA FEMORAL ;  Surgeon: Yvette Rack., MD;  Location: [place];  Service: Orthopedics;  Laterality: Left;   LEFT HEART CATH AND CORONARY ANGIOGRAPHY N/A 06/01/2020   Procedure: LEFT HEART CATH AND CORONARY ANGIOGRAPHY;  Surgeon: Nelva Bush, MD;  Location: [place] CV LAB;  Service: Cardiovascular;  Laterality: N/A;   LEFT HEART CATHETERIZATION WITH CORONARY ANGIOGRAM N/A 10/28/2013   Procedure: LEFT HEART CATHETERIZATION WITH CORONARY ANGIOGRAM;  Surgeon: Larey Dresser, MD;  Location: [facility] CATH LAB;  Service: Cardiovascular;  Laterality: N/A;   TOTAL KNEE ARTHROPLASTY Left 07/06/2015   Procedure: TOTAL KNEE ARTHROPLASTY;  Surgeon: Earlie Server, MD;  Location: [place];  Service: Orthopedics;  Laterality: Left;   TUBAL LIGATION     UPPER GASTROINTESTINAL ENDOSCOPY  05/17/2021    No outpatient medications have been marked as taking  for the 02/20/22 encounter (Appointment) with Verdun Rackley, Harrell Gave, MD.    Allergies: Boniva [ibandronic acid], Fosamax [alendronate sodium], and Prolia [denosumab]  Social History   Tobacco Use   Smoking status: Never   Smokeless tobacco: Never   Tobacco comments:    LIVES WITH 2 SMOKERS   Vaping Use   Vaping Use: Never used  Substance Use Topics   Alcohol use: Not Currently    Comment: 1 glass of wine per  month   Drug use: No    Family History  Problem Relation Age of Onset   Hypertension Mother    Stroke Mother    Breast cancer Mother    Stroke Father    Heart failure Father    Heart disease Father        CABG   Ovarian cancer Paternal Grandmother    Liver cancer Paternal Grandfather    Stroke Maternal Grandmother    Heart failure Maternal Grandmother    Heart failure Maternal Grandfather    Juvenile Diabetes Son    Healthy Son    Testicular cancer Son    Liver cancer Son    Colon cancer Son    Healthy Son    Hypertension Son    Healthy Son    Hypertension Son    Healthy Son    Healthy Daughter    Healthy Daughter    Healthy Daughter    Healthy Daughter    Stomach cancer Neg Hx    Rectal cancer Neg Hx    Esophageal cancer Neg Hx     Review of Systems: A 12-system review of systems was performed and was negative except as noted in the HPI.  --------------------------------------------------------------------------------------------------  Physical Exam: There were no vitals taken for this visit.  General:  NAD. Neck: No JVD or HJR. Lungs: Clear to auscultation bilaterally without wheezes or crackles. Heart: Regular rate and rhythm without murmurs, rubs, or gallops. Abdomen: Soft, nontender, nondistended. Extremities: No lower extremity edema.  EKG:  ***  Lab Results  Component Value Date   WBC 4.5 05/14/2021   HGB 14.3 05/14/2021   HCT 43.6 05/14/2021   MCV 99 (H) 05/14/2021   PLT 196 05/14/2021    Lab Results  Component Value Date   NA 138 11/13/2021   K 4.2 11/13/2021   CL 99 11/13/2021   CO2 32 11/13/2021   BUN 14 11/13/2021   CREATININE 0.83 11/13/2021   GLUCOSE 88 11/13/2021   ALT 27 11/13/2021    Lab Results  Component Value Date   CHOL 135 11/13/2021   HDL 73.40 11/13/2021   LDLCALC 43 11/13/2021   LDLDIRECT 59 07/13/2020   TRIG 95.0 11/13/2021   CHOLHDL 2 11/13/2021     --------------------------------------------------------------------------------------------------  ASSESSMENT AND PLAN: Harrell Gave [name], MD 02/20/2022 7:30 AM

## 2022-03-10 ENCOUNTER — Other Ambulatory Visit: Payer: Self-pay | Admitting: Internal Medicine

## 2022-03-14 ENCOUNTER — Other Ambulatory Visit: Payer: Self-pay | Admitting: Internal Medicine

## 2022-03-17 NOTE — Progress Notes (Deleted)
? ?Cardiology Office Note   ? ?Date:  03/17/2022  ? ?ID:  [name], DOB 11/08/1951, MRN [id number] ? ?PCP:  Flossie Buffy, NP  ?Cardiologist:  Nelva Bush, MD  ?Electrophysiologist:  None  ? ?Chief Complaint: Follow up ? ?History of Present Illness:  ? ?Diane [name] is a 71 y.o. female with history of CAD status post PCI to the proximal RCA in 01/9936, diastolic dysfunction, stroke, HTN, HLD, and GERD who presents for follow-up of CAD. ? ?Remote LHC in 09/2013 showed nonobstructive CAD.  Stress testing in 2018 with breast attenuation, though a small area of ischemia was unable to be excluded with normal LVSF.  She was seen with chest discomfort and exertional dyspnea in 01/2020 with subsequent coronary CTA in 04/2020 demonstrating significant two-vessel CAD involving a large LAD and codominant RCA.  LHC in 05/2020 demonstrated severe single-vessel CAD with 80% proximal RCA stenosis that was hemodynamically significant by DFR (0.88).  There was mild to moderate nonobstructive disease involving the LAD and LCx.  Hyperdynamic LV systolic function with normal filling pressure.  She underwent successful PCI/DES to the RCA.  Since undergoing PCI, she has had intermittent chest discomfort without indication for further ischemic testing.  She was last seen in the office in 08/2021 and was under increased stress surrounding the health of her father, and after having suffered a fracture right ankle back in the summer which was limiting her functional status. ? ? ?Labs independently reviewed: ?10/2021 - TC 135, TG 95, HDL 73, LDL 43, TSH normal, potassium 4.2, BUN 14, serum creatinine 0.83, albumin 4.3, AST/ALT normal ?04/2021 - Hgb 14.3, PLT 196 ? ?Past Medical History:  ?Diagnosis Date  ? Arthritis   ? CAD (coronary artery disease)   ? a. 09/2013 Cath: LM nl, LAD 30p, 54m LCX nl, RCA 30p, EF 55-60%; b. 04/2017 MV: EF>65%, apical defect ->breast attenuation. Sm. area of isch cannot be excluded; c. 04/2020  Cor CTA: Ca2+ = 704 (96%). Sev RCA/LAD dzs; d. 05/2020 Cath/PCI: LM nl, LAD [id number] LCX 20p, RCA 80p (DFR 0.88-->2.5x12 Synergy XD DES).  ? CVA (cerebral infarction) 1997  ? right sided weakeness and deaf in right ear  ? Deafness in right ear   ? from cva  ? Depression   ? Diastolic dysfunction   ? a. 01/2013 Echo: EF 55-60%, no rwma, Gr1 DD, PASP 2m61m b. 04/2017 Echo: EF 65-70%, no rwma, Gr1 DD.  ? GERD (gastroesophageal reflux disease)   ? Hyperlipidemia   ? Hypertension   ? PONV (postoperative nausea and vomiting)   ? 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 [facility]  ? deaf in right ear  ? Tubular adenoma of colon 2018  ? ? ?Past Surgical History:  ?Procedure Laterality Date  ? BREAST BIOPSY Left 1984  ? abcess  ? CARDIAC CATHETERIZATION    ? CESAREAN SECTION    ? CHOLECYSTECTOMY  1990  ? CHONDROPLASTY Left 01/05/2015  ? Procedure: CHONDROPLASTY;  Surgeon: W D Yvette RackD;  [facility] [place];  Laterality: Left;  ? COLONOSCOPY  05/17/2021  ? CORONARY STENT INTERVENTION N/A 06/01/2020  ? Procedure: CORONARY STENT INTERVENTION;  Surgeon: End,Nelva Bush;  [facility];  Service: Cardiovascular;  Laterality: N/A;  ? INTRAVASCULAR PRESSURE WIRE/FFR STUDY N/A 06/01/2020  ? Procedure: INTRAVASCULAR PRESSURE WIRE/FFR STUDY;  Surgeon: End,Nelva Bush;  [facility];  Service: Cardiovascular;  Laterality: N/A;  ? KNEE ARTHROSCOPY WITH MEDIAL MENISECTOMY Left 01/05/2015  ? Procedure: LEFT KNEE ARTHROSCOPY WITH MEDIAL AND LATERAL MENISECTOMY; DEBRIDEMENT LATERAL PATELLA FEMORAL ;  Surgeon: Yvette Rack., MD;  Location: [place];  Service: Orthopedics;  Laterality: Left;  ? LEFT HEART CATH AND CORONARY ANGIOGRAPHY N/A 06/01/2020  ? Procedure: LEFT HEART CATH AND CORONARY ANGIOGRAPHY;  Surgeon: Nelva Bush, MD;  Location: [place] CV LAB;  Service: Cardiovascular;  Laterality: N/A;  ? LEFT HEART  CATHETERIZATION WITH CORONARY ANGIOGRAM N/A 10/28/2013  ? Procedure: LEFT HEART CATHETERIZATION WITH CORONARY ANGIOGRAM;  Surgeon: Larey Dresser, MD;  Location: [facility] CATH LAB;  Service: Cardiovascular;  Laterality: N/A;  ? TOTAL KNEE ARTHROPLASTY Left 07/06/2015  ? Procedure: TOTAL KNEE ARTHROPLASTY;  Surgeon: Earlie Server, MD;  Location: [place];  Service: Orthopedics;  Laterality: Left;  ? TUBAL LIGATION    ? UPPER GASTROINTESTINAL ENDOSCOPY  05/17/2021  ? ? ?Current Medications: ?No outpatient medications have been marked as taking for the 03/19/22 encounter (Appointment) with Rise Mu, PA-C.  ? ? ?Allergies:   Boniva [ibandronic acid], Fosamax [alendronate sodium], and Prolia [denosumab]  ? ?Social History  ? ?Socioeconomic History  ? Marital status: Widowed  ?  Spouse name: [name]  ? Number of children: 8  ? Years of education: 19  ? Highest education level: Not on file  ?Occupational History  ? Occupation: Retired  ?Tobacco Use  ? Smoking status: Never  ? Smokeless tobacco: Never  ? Tobacco comments:  ?  LIVES WITH 2 SMOKERS   ?Vaping Use  ? Vaping Use: Never used  ?Substance and Sexual Activity  ? Alcohol use: Not Currently  ?  Comment: 1 glass of wine per month  ? Drug use: No  ? Sexual activity: Yes  ?  Birth control/protection: Post-menopausal  ?Other Topics Concern  ? Not on file  ?Social History Narrative  ? Divorced, but has fiancee.  Lives with fiancee in [place].  Moved here recently from [place].    ? She 8 children- ages 47- 76.  ?   ?   ?   ?   ? ?Social Determinants of Health  ? ?Financial Resource Strain: Not on file  ?Food Insecurity: Not on file  ?Transportation Needs: Not on file  ?Physical Activity: Not on file  ?Stress: Not on file  ?Social Connections: Not on file  ?  ? ?Family History:  ?The patient's family history includes Breast cancer in her mother; Colon cancer in her son; Healthy in her daughter, daughter, daughter, daughter, son, son, son, and son; Heart disease in  her father; Heart failure in her father, maternal grandfather, and maternal grandmother; Hypertension in her mother, son, and son; Juvenile Diabetes in her son; Liver cancer in her paternal grandfather and son; Ovarian cancer in her paternal grandmother; Stroke in her father, maternal grandmother, and mother; Testicular cancer in her son. There is no history of Stomach cancer, Rectal cancer, or Esophageal cancer. ? ?ROS:   ?ROS ? ? ?EKGs/Labs/Other Studies Reviewed:   ? ?Studies reviewed were summarized above. The additional studies were reviewed today: ? ?LHC 06/01/2020: ?Conclusions: ?Severe single-vessel coronary artery disease with 80% proximal RCA stenosis that is hemodynamically significant by DFR (0.88). ?Mild to moderate, non-obstructive coronary artery disease involving the LAD and LCx. ?Hyperdynamic left ventricular systolic function with normal filling pressure. ?Successful PCI to proximal RCA using Syergy 2.5 x 12 mm drug-eluting stent with 0% residual stenosis and TIMI-3 flow. ?  ?  Recommendations: ?Dual antiplatelet therapy with aspirin and clopidogrel for at least 6 months. ?Aggressive secondary prevention. ?Anticipate same-day discharge if no post-cath complications. ?__________ ? ?Coronary CTA 05/10/2020: ?Aorta: Normal size. Mild aortic root and descending aorta ?calcifications. No dissection. ?  ?Aortic Valve:  Trileaflet.  There is mild calcifications. ?  ?Coronary Arteries:  Normal coronary origin.  Co-dominance. ?  ?RCA is a medium co-dominant artery that gives rise to PDA and PLVB. ?There is severe (>70) calcified plaque in the proximal RCA. The mid ?portion with moderate (50-69%) calcified plaque. ?  ?Left main is a large artery that gives rise to LAD and LCX arteries. ?There is a moderate (50-69%) calcified plaque. ?  ?LAD is a large vessel. There is a segmental lesion in the proximal ?portion of the LAD- the first is short moderate (50-69%) calcified ?lesion and the second is the long severe  (>70%) tubular lesion. The ?mid portion of the LAD with mild (25-49%) calcified plaque. There ?another mid LAD plaque which is severe (>70%). The mid-distal LAD ?with moderate (50-69%) calcified plaque. D1 with a m

## 2022-03-19 ENCOUNTER — Ambulatory Visit: Payer: Medicare HMO | Admitting: Physician Assistant

## 2022-03-28 ENCOUNTER — Other Ambulatory Visit: Payer: Self-pay | Admitting: Nurse Practitioner

## 2022-03-28 DIAGNOSIS — E785 Hyperlipidemia, unspecified: Secondary | ICD-10-CM

## 2022-04-01 ENCOUNTER — Other Ambulatory Visit: Payer: Self-pay | Admitting: Internal Medicine

## 2022-04-03 ENCOUNTER — Other Ambulatory Visit: Payer: Self-pay | Admitting: Nurse Practitioner

## 2022-04-03 DIAGNOSIS — F411 Generalized anxiety disorder: Secondary | ICD-10-CM

## 2022-04-04 ENCOUNTER — Other Ambulatory Visit: Payer: Self-pay | Admitting: Nurse Practitioner

## 2022-04-04 DIAGNOSIS — F411 Generalized anxiety disorder: Secondary | ICD-10-CM

## 2022-04-04 DIAGNOSIS — R519 Headache, unspecified: Secondary | ICD-10-CM

## 2022-04-04 DIAGNOSIS — F339 Major depressive disorder, recurrent, unspecified: Secondary | ICD-10-CM

## 2022-04-08 ENCOUNTER — Ambulatory Visit (INDEPENDENT_AMBULATORY_CARE_PROVIDER_SITE_OTHER): Payer: Medicare HMO | Admitting: Nurse Practitioner

## 2022-04-08 ENCOUNTER — Encounter: Payer: Self-pay | Admitting: Nurse Practitioner

## 2022-04-08 VITALS — BP 120/74 | HR 74 | Temp 97.2°F | Ht 62.0 in | Wt 197.6 lb

## 2022-04-08 DIAGNOSIS — I25118 Atherosclerotic heart disease of native coronary artery with other forms of angina pectoris: Secondary | ICD-10-CM

## 2022-04-08 DIAGNOSIS — R519 Headache, unspecified: Secondary | ICD-10-CM

## 2022-04-08 DIAGNOSIS — F332 Major depressive disorder, recurrent severe without psychotic features: Secondary | ICD-10-CM | POA: Diagnosis not present

## 2022-04-08 DIAGNOSIS — F411 Generalized anxiety disorder: Secondary | ICD-10-CM

## 2022-04-08 DIAGNOSIS — I5032 Chronic diastolic (congestive) heart failure: Secondary | ICD-10-CM

## 2022-04-08 DIAGNOSIS — G8929 Other chronic pain: Secondary | ICD-10-CM | POA: Diagnosis not present

## 2022-04-08 LAB — BASIC METABOLIC PANEL
BUN: 12 mg/dL (ref 6–23)
CO2: 29 mEq/L (ref 19–32)
Calcium: 9.7 mg/dL (ref 8.4–10.5)
Chloride: 99 mEq/L (ref 96–112)
Creatinine, Ser: 0.69 mg/dL (ref 0.40–1.20)
GFR: 88.44 mL/min (ref >60.00)
Glucose, Bld: 89 mg/dL (ref 70–99)
Potassium: 4.4 mEq/L (ref 3.5–5.1)
Sodium: 137 mEq/L (ref 135–145)

## 2022-04-08 MED ORDER — BUTALBITAL-APAP-CAFFEINE 50-325-40 MG PO TABS: 1.0000 | ORAL_TABLET | Freq: Three times a day (TID) | ORAL | 1 refills | Status: DC | PRN

## 2022-04-08 MED ORDER — DIAZEPAM 5 MG PO TABS: 5.0000 mg | ORAL_TABLET | Freq: Two times a day (BID) | ORAL | 2 refills | Status: DC | PRN

## 2022-04-08 MED ORDER — AMITRIPTYLINE HCL 25 MG PO TABS: ORAL_TABLET | ORAL | 3 refills | Status: DC

## 2022-04-08 MED ORDER — DULOXETINE HCL 20 MG PO CPEP: 20.0000 mg | ORAL_CAPSULE | Freq: Every day | ORAL | 5 refills | Status: DC

## 2022-04-08 NOTE — Progress Notes (Signed)
? ?             Established Patient Visit ? ?Patient: Diane [name]   DOB: 21-Apr-1951   71 y.o. Female  MRN: [id number] ?Visit Date: 04/08/2022 ? ?Subjective:  ?  ?Chief Complaint  ?Patient presents with  ? Follow-up  ?  Follow up on depression.   ? ?HPI ?Depression, recurrent ([place]) ?Chronic, waxing and waning, minimal improvement with sertraline, elavil and valium. ?No SI/HI/hallucination. ?Denies use of any opioid, ETOH or iilicit drug use. ?She agreed to schedule appt with therapist. ?Agreed to switch sertraline '100mg'$  to cymbalta '20mg'$  ?Maintain valium and elavil dose ?F/up in 56month? ?Chronic headaches ?2-3headaches per month. ?resolves with 1tab of excedrin each episode. ?Med refill sent ? ?[place] ?Managed by Dr. ESaunders Revel(cardiology) with benazepril/hctz ?No CP, SOB, palpitation, or LE edema. ?BP Readings from Last 3 Encounters:  ?04/08/22 120/74  ?11/13/21 106/60  ?09/18/21 110/70  ? ?Wt Readings from Last 3 Encounters:  ?04/08/22 197 lb 9.6 oz (89.6 kg)  ?11/13/21 187 lb 14.4 oz (85.2 kg)  ?09/18/21 188 lb (85.3 kg)  ? ?Repeat BMP ? ?Coronary artery disease of native artery of native heart [place] angina p[place]is [place] ?Managed by Dr. ESaunders Revel(cardiology) ?Current use of Ranexa, atorvastatin, zetia, plavix, and cardizem ?No CP or SOB or palpitations, or dizziness, no blood in stool. ?upcoming appt with cardiology 04/09/22 ? ?BP Readings from Last 3 Encounters:  ?04/08/22 120/74  ?11/13/21 106/60  ?09/18/21 110/70  ? ? ? ?  04/08/2022  ?  2:37 PM 11/13/2021  ?  1:37 PM 01/08/2021  ? 10:50 AM  ?Depression screen PHQ 2/9  ?Decreased Interest 2 2 0  ?Down, Depressed, Hopeless '2 2 1  '$ ?PHQ - 2 Score '4 4 1  '$ ?Altered sleeping 3 3   ?Tired, decreased energy 2 3   ?Change in appetite 3 3   ?Feeling bad or failure about yourself  3 3   ?Trouble concentrating 3 3   ?Moving slowly or fidgety/restless 3 3   ?Suicidal thoughts 0 0   ?PHQ-9 Score 21 22   ?Difficult doing work/chores Very difficult  Very difficult   ?  ?Reviewed medical, surgical, and social history today ? ?Medications: ?Outpatient Medications Prior to Visit  ?Medication Sig  ? albuterol (VENTOLIN HFA) 108 (90 Base) MCG/ACT inhaler Inhale 1-2 puffs into the lungs every 6 (six) hours as needed for wheezing or shortness of breath.  ? atorvastatin (LIPITOR) 80 MG tablet TAKE 1 TABLET BY MOUTH EVERY DAY  ? benazepril-hydrochlorthiazide (LOTENSIN HCT) 20-12.5 MG tablet Take 1 tablet by mouth daily.  ? budesonide-formoterol (SYMBICORT) 80-4.5 MCG/ACT inhaler Inhale 2 puffs into the lungs 2 (two) times daily as needed.  ? cholecalciferol (VITAMIN D) 1000 UNITS tablet Take 1,000 Units by mouth daily.  ? clopidogrel (PLAVIX) 75 MG tablet TAKE 1 TABLET BY MOUTH EVERY DAY  ? diltiazem (CARDIZEM CD) 180 MG 24 hr capsule TAKE 1 CAPSULE BY MOUTH EVERY DAY  ? ezetimibe (ZETIA) 10 MG tablet Take 1 tablet (10 mg total) by mouth daily.  ? Multiple Minerals-Vitamins (CAL-MAG-ZINC-D) TABS Take by mouth.  ? nitroGLYCERIN (NITROSTAT) 0.4 MG SL tablet PLACE 1 TABLET (0.4 MG TOTAL) UNDER THE TONGUE EVERY 5 (FIVE) MINUTES AS NEEDED FOR CHEST PAIN. MAXIMUM OF 3 DOSES.  ? pantoprazole (PROTONIX) 40 MG tablet Take 1 tablet (40 mg total) by mouth daily.  ? ranolazine (RANEXA) 500 MG 12 hr tablet TAKE 1 TABLET BY MOUTH TWICE  A DAY  ? Rhubarb (ESTROVEN COMPLETE PO) Take 1 tablet by mouth daily.  ? valACYclovir (VALTREX) 1000 MG tablet Take 500 mg by mouth 2 (two) times daily as needed.  ? [DISCONTINUED] amitriptyline (ELAVIL) 25 MG tablet TAKE 1 TABLET BY MOUTH EVERYDAY AT BEDTIME  ? [DISCONTINUED] butalbital-acetaminophen-caffeine (FIORICET) 50-325-40 MG tablet Take 1 tablet by mouth every 8 (eight) hours as needed for headache or migraine.  ? [DISCONTINUED] diazepam (VALIUM) 5 MG tablet Take 1 tablet (5 mg total) by mouth every 12 (twelve) hours as needed (anxiety, sleep). No additional refills without office visit.  ? [DISCONTINUED] sertraline (ZOLOFT) 100 MG tablet Take  1 tablet (100 mg total) by mouth daily.  ? ?No facility-administered medications prior to visit.  ? ?Reviewed past medical and social history.  ? ?ROS per HPI above ? ? ?   ?Objective:  ?BP 120/74 (BP Location: Left Arm, Patient Position: Sitting, Cuff Size: Large)   Pulse 74   Temp (!) 97.2 ?F (36.2 ?C) (Temporal)   Ht '5\' 2"'$  (1.575 m)   Wt 197 lb 9.6 oz (89.6 kg)   SpO2 97%   BMI 36.14 kg/m?  ? ?  ? ?Physical Exam ?Psychiatric:     ?   Attention and Perception: Attention normal.     ?   Mood and Affect: Mood is anxious.     ?   Speech: Speech normal.     ?   Behavior: Behavior is cooperative.     ?   Thought Content: Thought content normal.     ?   Cognition and Memory: Cognition normal.  ?  ?No results found for any visits on 04/08/22. ?   ?Assessment & Plan:  ?  ?Problem List Items Addressed This Visit   ? ?  ? Cardiovascular and Mediastinum  ? Chronic diastolic heart failure ([place])  ?  Managed by Dr. Saunders Revel (cardiology) with benazepril/hctz ?No CP, SOB, palpitation, or LE edema. ?BP Readings from Last 3 Encounters:  ?04/08/22 120/74  ?11/13/21 106/60  ?09/18/21 110/70  ? ?Wt Readings from Last 3 Encounters:  ?04/08/22 197 lb 9.6 oz (89.6 kg)  ?11/13/21 187 lb 14.4 oz (85.2 kg)  ?09/18/21 188 lb (85.3 kg)  ? ?Repeat BMP ?  ?  ? Relevant Orders  ? Basic metabolic panel  ? Coronary artery disease of native artery of native heart with stable angina pectoris ([place])  ?  Managed by Dr. Saunders Revel (cardiology) ?Current use of Ranexa, atorvastatin, zetia, plavix, and cardizem ?No CP or SOB or palpitations, or dizziness, no blood in stool. ?upcoming appt with cardiology 04/09/22 ? ?BP Readings from Last 3 Encounters:  ?04/08/22 120/74  ?11/13/21 106/60  ?09/18/21 110/70  ? ?  ?  ? Relevant Medications  ? DULoxetine (CYMBALTA) 20 MG capsule  ? butalbital-acetaminophen-caffeine (FIORICET) 50-325-40 MG tablet  ? amitriptyline (ELAVIL) 25 MG tablet  ?  ? Other  ? Chronic headaches  ?  2-3headaches per month. ?resolves with 1tab of  excedrin each episode. ?Med refill sent ?  ?  ? Relevant Medications  ? DULoxetine (CYMBALTA) 20 MG capsule  ? butalbital-acetaminophen-caffeine (FIORICET) 50-325-40 MG tablet  ? amitriptyline (ELAVIL) 25 MG tablet  ? Depression, recurrent ([place])  ?  Chronic, waxing and waning, minimal improvement with sertraline, elavil and valium. ?No SI/HI/hallucination. ?Denies use of any opioid, ETOH or iilicit drug use. ?She agreed to schedule appt with therapist. ?Agreed to switch sertraline '100mg'$  to cymbalta '20mg'$  ?Maintain valium and elavil dose ?F/up in 31month?  ?  ?  Relevant Medications  ? DULoxetine (CYMBALTA) 20 MG capsule  ? amitriptyline (ELAVIL) 25 MG tablet  ? diazepam (VALIUM) 5 MG tablet  ? Generalized anxiety disorder - Primary  ? Relevant Medications  ? DULoxetine (CYMBALTA) 20 MG capsule  ? amitriptyline (ELAVIL) 25 MG tablet  ? diazepam (VALIUM) 5 MG tablet  ? ?Return in about 4 weeks (around 05/06/2022) for anxiety and depression (video). ? ?  ? ?Wilfred Lacy, NP ? ? ?

## 2022-04-08 NOTE — Telephone Encounter (Signed)
Handle at appointment today. ?

## 2022-04-08 NOTE — Assessment & Plan Note (Signed)
Chronic, waxing and waning, minimal improvement with sertraline, elavil and valium. ?No SI/HI/hallucination. ?Denies use of any opioid, ETOH or iilicit drug use. ?She agreed to schedule appt with therapist. ?Agreed to switch sertraline '100mg'$  to cymbalta '20mg'$  ?Maintain valium and elavil dose ?F/up in 42month?

## 2022-04-08 NOTE — Assessment & Plan Note (Signed)
Managed by Dr. Saunders Revel (cardiology) with benazepril/hctz ?No CP, SOB, palpitation, or LE edema. ?BP Readings from Last 3 Encounters:  ?04/08/22 120/74  ?11/13/21 106/60  ?09/18/21 110/70  ? ?Wt Readings from Last 3 Encounters:  ?04/08/22 197 lb 9.6 oz (89.6 kg)  ?11/13/21 187 lb 14.4 oz (85.2 kg)  ?09/18/21 188 lb (85.3 kg)  ? ?Repeat BMP ?

## 2022-04-08 NOTE — Assessment & Plan Note (Signed)
Managed by Dr. Saunders Revel (cardiology) ?Current use of Ranexa, atorvastatin, zetia, plavix, and cardizem ?No CP or SOB or palpitations, or dizziness, no blood in stool. ?upcoming appt with cardiology 04/09/22 ? ?BP Readings from Last 3 Encounters:  ?04/08/22 120/74  ?11/13/21 106/60  ?09/18/21 110/70  ? ?

## 2022-04-08 NOTE — Patient Instructions (Signed)
Stop sertraline ?Start cymbalta ?Maintain valium and elavil dose. ?Schedule appt with therapist. ? ?Go to lab for blood draw ?

## 2022-04-08 NOTE — Assessment & Plan Note (Signed)
2-3headaches per month. ?resolves with 1tab of excedrin each episode. ?Med refill sent ?

## 2022-04-09 ENCOUNTER — Encounter: Payer: Self-pay | Admitting: Internal Medicine

## 2022-04-09 ENCOUNTER — Ambulatory Visit: Payer: Medicare HMO | Admitting: Internal Medicine

## 2022-04-09 VITALS — BP 130/80 | HR 75 | Ht 62.0 in | Wt 198.0 lb

## 2022-04-09 DIAGNOSIS — I10 Essential (primary) hypertension: Secondary | ICD-10-CM | POA: Diagnosis not present

## 2022-04-09 DIAGNOSIS — E785 Hyperlipidemia, unspecified: Secondary | ICD-10-CM

## 2022-04-09 DIAGNOSIS — I25118 Atherosclerotic heart disease of native coronary artery with other forms of angina pectoris: Secondary | ICD-10-CM

## 2022-04-09 DIAGNOSIS — I5032 Chronic diastolic (congestive) heart failure: Secondary | ICD-10-CM

## 2022-04-09 NOTE — Progress Notes (Signed)
? ?Follow-up Outpatient Visit ?Date: 04/09/2022 ? ?Primary Care Provider: ?Flossie Buffy, NP ?[address] [address] ? ?Chief Complaint: Follow-up CAD and diastolic heart failure ? ?HPI:  Diane [name] is a 71 y.o. female with history of CAD status post PCI to proximal RCA (0/0174), diastolic dysfunction, HTN, HLD, stroke, and GERD, who presents for follow-up of CAD and HFpEF.  I last saw her in 08/2021, at which time she was under quite a bit of stress, as her father was hospitalized in [place] with heart failure.  She was also recovering from an ankle fracture over the summer.  She was doing well from a heart standpoint.  We did not make any medication changes or pursue additional testing at that time. ? ?Today, Diane [name] reports that she is feeling fairly well.  Her ankle fracture has recovered, though she notes some continued pain in her knees.  She has put on a little weight since last year and wonders if this is contributing.  She attributes this to being less active as well as some dietary indiscretion.  She notes 1 episode of left upper chest pain near the shoulder radiating to the proximal arm that occurred after eating.  It lasted a few minutes and resolved after burping.  This happened a month ago and has not recurred.  She has not had any exertional chest pain.  She has mild exertional dyspnea with strenuous activities, unchanged for over a year.  Denies palpitations and lightheadedness.  She notes some dependent edema when sitting in the car for several hours but otherwise has not had any significant swelling.  She notes that her home blood pressures are typically well controlled.  She was changed from sertraline to duloxetine yesterday by her PCP, as she was feeling somewhat fidgety.  She has not started the new medication yet. ? ?-------------------------------------------------------------------------------------------------- ? ?Past Medical History:   ?Diagnosis Date  ? Arthritis   ? CAD (coronary artery disease)   ? a. 09/2013 Cath: LM nl, LAD 30p, 55m LCX nl, RCA 30p, EF 55-60%; b. 04/2017 MV: EF>65%, apical defect ->breast attenuation. Sm. area of isch cannot be excluded; c. 04/2020 Cor CTA: Ca2+ = 704 (96%). Sev RCA/LAD dzs; d. 05/2020 Cath/PCI: LM nl, LAD [id number] LCX 20p, RCA 80p (DFR 0.88-->2.5x12 Synergy XD DES).  ? CVA (cerebral infarction) 1997  ? right sided weakeness and deaf in right ear  ? Deafness in right ear   ? from cva  ? Depression   ? Diastolic dysfunction   ? a. 01/2013 Echo: EF 55-60%, no rwma, Gr1 DD, PASP 64m85m b. 04/2017 Echo: EF 65-70%, no rwma, Gr1 DD.  ? GERD (gastroesophageal reflux disease)   ? Hyperlipidemia   ? Hypertension   ? PONV (postoperative nausea and vomiting)   ? 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 [facility]  ? deaf in right ear  ? Tubular adenoma of colon 2018  ? ?Past Surgical History:  ?Procedure Laterality Date  ? BREAST BIOPSY Left 1984  ? abcess  ? CARDIAC CATHETERIZATION    ? CESAREAN SECTION    ? CHOLECYSTECTOMY  1990  ? CHONDROPLASTY Left 01/05/2015  ? Procedure: CHONDROPLASTY;  Surgeon: W D Yvette RackD;  [facility];  Laterality: Left;  ? COLONOSCOPY  05/17/2021  ? CORONARY STENT INTERVENTION N/A 06/01/2020  ? Procedure: CORONARY STENT INTERVENTION;  Surgeon: Raushanah Osmundson,Nelva Bush;  [facility];  Service: Cardiovascular;  Laterality: N/A;  ? INTRAVASCULAR PRESSURE WIRE/FFR STUDY N/A 06/01/2020  ? Procedure: INTRAVASCULAR PRESSURE WIRE/FFR STUDY;  Surgeon: Nelva Bush, MD;  Location: [place] [facility];  Service: Cardiovascular;  Laterality: N/A;  ? KNEE ARTHROSCOPY WITH MEDIAL MENISECTOMY Left 01/05/2015  ? Procedure: LEFT KNEE ARTHROSCOPY WITH MEDIAL AND LATERAL MENISECTOMY; DEBRIDEMENT LATERAL PATELLA FEMORAL ;  Surgeon: Yvette Rack., MD;  Location: [place];  Service: Orthopedics;  Laterality: Left;  ? LEFT  HEART CATH AND CORONARY ANGIOGRAPHY N/A 06/01/2020  ? Procedure: LEFT HEART CATH AND CORONARY ANGIOGRAPHY;  Surgeon: Nelva Bush, MD;  Location: [place] [facility];  Service: Cardiovascular;  Laterality: N/A;  ? LEFT HEART CATHETERIZATION WITH CORONARY ANGIOGRAM N/A 10/28/2013  ? Procedure: LEFT HEART CATHETERIZATION WITH CORONARY ANGIOGRAM;  Surgeon: Larey Dresser, MD;  Location: [facility] C[facility] LAB;  Service: Cardiovascular;  Laterality: N/A;  ? TOTAL KNEE ARTHROPLASTY Left 07/06/2015  ? Procedure: TOTAL KNEE ARTHROPLASTY;  Surgeon: Earlie Server, MD;  Location: [place];  Service: Orthopedics;  Laterality: Left;  ? TUBAL LIGATION    ? UPPER GASTROINTESTINAL ENDOSCOPY  05/17/2021  ? ? ?Current Meds  ?Medication Sig  ? albuterol (VENTOLIN HFA) 108 (90 Base) MCG/ACT inhaler Inhale 1-2 puffs into the lungs every 6 (six) hours as needed for wheezing or shortness of breath.  ? amitriptyline (ELAVIL) 25 MG tablet TAKE 1 TABLET BY MOUTH EVERYDAY AT BEDTIME  ? atorvastatin (LIPITOR) 80 MG tablet TAKE 1 TABLET BY MOUTH EVERY DAY  ? benazepril-hydrochlorthiazide (LOTENSIN HCT) 20-12.5 MG tablet Take 1 tablet by mouth daily.  ? budesonide-formoterol (SYMBICORT) 80-4.5 MCG/ACT inhaler Inhale 2 puffs into the lungs 2 (two) times daily as needed.  ? butalbital-acetaminophen-caffeine (FIORICET) 50-325-40 MG tablet Take 1 tablet by mouth every 8 (eight) hours as needed for headache or migraine.  ? cholecalciferol (VITAMIN D) 1000 UNITS tablet Take 1,000 Units by mouth daily.  ? clopidogrel (PLAVIX) 75 MG tablet TAKE 1 TABLET BY MOUTH EVERY DAY  ? diazepam (VALIUM) 5 MG tablet Take 1 tablet (5 mg total) by mouth every 12 (twelve) hours as needed (anxiety, sleep).  ? diltiazem (CARDIZEM CD) 180 MG 24 hr capsule TAKE 1 CAPSULE BY MOUTH EVERY DAY  ? DULoxetine (CYMBALTA) 20 MG capsule Take 1 capsule (20 mg total) by mouth daily.  ? ezetimibe (ZETIA) 10 MG tablet Take 1 tablet (10 mg total) by mouth daily.  ? Multiple Minerals-Vitamins  (CAL-MAG-ZINC-D) TABS Take by mouth.  ? nitroGLYCERIN (NITROSTAT) 0.4 MG SL tablet PLACE 1 TABLET (0.4 MG TOTAL) UNDER THE TONGUE EVERY 5 (FIVE) MINUTES AS NEEDED FOR CHEST PAIN. MAXIMUM OF 3 DOSES.  ? pantoprazole (PROTONIX) 40 MG tablet Take 1 tablet (40 mg total) by mouth daily.  ? ranolazine (RANEXA) 500 MG 12 hr tablet TAKE 1 TABLET BY MOUTH TWICE A DAY  ? Rhubarb (ESTROVEN COMPLETE PO) Take 1 tablet by mouth daily.  ? valACYclovir (VALTREX) 1000 MG tablet Take 500 mg by mouth 2 (two) times daily as needed.  ? ? ?Allergies: Boniva [ibandronic acid], Fosamax [alendronate sodium], and Prolia [denosumab] ? ?Social History  ? ?Tobacco Use  ? Smoking status: Never  ? Smokeless tobacco: Never  ? Tobacco comments:  ?  LIVES WITH 2 SMOKERS   ?Vaping Use  ? Vaping Use: Never used  ?Substance Use Topics  ? Alcohol use: Not Currently  ?  Comment: 1 glass of wine per month  ? Drug use: No  ? ? ?  Family History  ?Problem Relation Age of Onset  ? Hypertension Mother   ? Stroke Mother   ? Breast cancer Mother   ? Stroke Father   ? Heart failure Father   ? Heart disease Father   ?     CABG  ? Ovarian cancer Paternal Grandmother   ? Liver cancer Paternal Grandfather   ? Stroke Maternal Grandmother   ? Heart failure Maternal Grandmother   ? Heart failure Maternal Grandfather   ? Juvenile Diabetes Son   ? Healthy Son   ? Testicular cancer Son   ? Liver cancer Son   ? Colon cancer Son   ? Healthy Son   ? Hypertension Son   ? Healthy Son   ? Hypertension Son   ? Healthy Son   ? Healthy Daughter   ? Healthy Daughter   ? Healthy Daughter   ? Healthy Daughter   ? Stomach cancer Neg Hx   ? Rectal cancer Neg Hx   ? Esophageal cancer Neg Hx   ? ? ?Review of Systems: ?A 12-system review of systems was performed and was negative except as noted in the HPI. ? ?-------------------------------------------------------------------------------------------------- ? ?Physical Exam: ?BP 130/80 (BP Location: Left Arm, Patient Position: Sitting, Cuff  Size: Large)   Pulse 75   Ht '5\' 2"'$  (1.575 m)   Wt 198 lb (89.8 kg)   SpO2 98%   BMI 36.21 kg/m?  ? ?General:  NAD. ?Neck: No JVD or HJR. ?Lungs: Clear to auscultation bilaterally without wheezes or crackles. ?[place]

## 2022-04-09 NOTE — Patient Instructions (Signed)

## 2022-04-23 ENCOUNTER — Telehealth: Payer: Self-pay

## 2022-04-23 ENCOUNTER — Ambulatory Visit: Payer: Medicare HMO

## 2022-04-23 NOTE — Telephone Encounter (Signed)
Called patient x 3 automatically rolls to voice mail , unable to leave a message .  Patient may reschedule for next available appointment. ? ? ?L.Masae Lukacs,LPN  ?

## 2022-04-23 NOTE — Telephone Encounter (Signed)
I spoke to patient and she already called and rescheduled appointment to 04/29/22.  Patient said if her phone doesn't recognize the number, she can't pick up.   ?

## 2022-04-28 ENCOUNTER — Other Ambulatory Visit: Payer: Self-pay | Admitting: Internal Medicine

## 2022-04-29 ENCOUNTER — Ambulatory Visit: Payer: Medicare HMO

## 2022-04-30 ENCOUNTER — Ambulatory Visit (INDEPENDENT_AMBULATORY_CARE_PROVIDER_SITE_OTHER): Payer: Medicare HMO

## 2022-04-30 DIAGNOSIS — Z0000 Encounter for general adult medical examination without abnormal findings: Secondary | ICD-10-CM | POA: Diagnosis not present

## 2022-04-30 NOTE — Patient Instructions (Signed)
Ms. Kamen , ?Thank you for taking time to come for your Medicare Wellness Visit. I appreciate your ongoing commitment to your health goals. Please review the following plan we discussed and let me know if I can assist you in the future.  ? ?Screening recommendations/referrals: ?Colonoscopy: 05/17/2021  due 2025 ?Mammogram: 04/2021 patient to call schedule  ?Bone Density: 09/20/2021 ?Recommended yearly ophthalmology/optometry visit for glaucoma screening and checkup ?Recommended yearly dental visit for hygiene and checkup ? ?Vaccinations: ?Influenza vaccine: completed  ?Pneumococcal vaccine: completed  ?Tdap vaccine: 047/15/2015 ?Shingles vaccine: completed    ? ?Advanced directives: yes  ? ?Conditions/risks identified: none  ? ?Next appointment: none  ? ? ?Preventive Care 18 Years and Older, Female ?Preventive care refers to lifestyle choices and visits with your health care provider that can promote health and wellness. ?What does preventive care include? ?A yearly physical exam. This is also called an annual well check. ?Dental exams once or twice a year. ?Routine eye exams. Ask your health care provider how often you should have your eyes checked. ?Personal lifestyle choices, including: ?Daily care of your teeth and gums. ?Regular physical activity. ?Eating a healthy diet. ?Avoiding tobacco and drug use. ?Limiting alcohol use. ?Practicing safe sex. ?Taking low-dose aspirin every day. ?Taking vitamin and mineral supplements as recommended by your health care provider. ?What happens during an annual well check? ?The services and screenings done by your health care provider during your annual well check will depend on your age, overall health, lifestyle risk factors, and family history of disease. ?Counseling  ?Your health care provider may ask you questions about your: ?Alcohol use. ?Tobacco use. ?Drug use. ?Emotional well-being. ?Home and relationship well-being. ?Sexual activity. ?Eating habits. ?History of  falls. ?Memory and ability to understand (cognition). ?Work and work Statistician. ?Reproductive health. ?Screening  ?You may have the following tests or measurements: ?Height, weight, and BMI. ?Blood pressure. ?Lipid and cholesterol levels. These may be checked every 5 years, or more frequently if you are over 91 years old. ?Skin check. ?Lung cancer screening. You may have this screening every year starting at age 28 if you have a 30-pack-year history of smoking and currently smoke or have quit within the past 15 years. ?Fecal occult blood test (FOBT) of the stool. You may have this test every year starting at age 42. ?Flexible sigmoidoscopy or colonoscopy. You may have a sigmoidoscopy every 5 years or a colonoscopy every 10 years starting at age 50. ?Hepatitis C blood test. ?Hepatitis B blood test. ?Sexually transmitted disease (STD) testing. ?Diabetes screening. This is done by checking your blood sugar (glucose) after you have not eaten for a while (fasting). You may have this done every 1-3 years. ?Bone density scan. This is done to screen for osteoporosis. You may have this done starting at age 52. ?Mammogram. This may be done every 1-2 years. Talk to your health care provider about how often you should have regular mammograms. ?Talk with your health care provider about your test results, treatment options, and if necessary, the need for more tests. ?Vaccines  ?Your health care provider may recommend certain vaccines, such as: ?Influenza vaccine. This is recommended every year. ?Tetanus, diphtheria, and acellular pertussis (Tdap, Td) vaccine. You may need a Td booster every 10 years. ?Zoster vaccine. You may need this after age 57. ?Pneumococcal 13-valent conjugate (PCV13) vaccine. One dose is recommended after age 43. ?Pneumococcal polysaccharide (PPSV23) vaccine. One dose is recommended after age 41. ?Talk to your health care provider about which screenings  and vaccines you need and how often you need  them. ?This information is not intended to replace advice given to you by your health care provider. Make sure you discuss any questions you have with your health care provider. ?Document Released: 01/11/2016 Document Revised: 09/03/2016 Document Reviewed: 10/16/2015 ?Elsevier Interactive Patient Education ? 2017 [employer]. ? ?Fall Prevention in the Home ?Falls can cause injuries. They can happen to people of all ages. There are many things you can do to make your home safe and to help prevent falls. ?What can I do on the outside of my home? ?Regularly fix the edges of walkways and driveways and fix any cracks. ?Remove anything that might make you trip as you walk through a door, such as a raised step or threshold. ?Trim any bushes or trees on the path to your home. ?Use bright outdoor lighting. ?Clear any walking paths of anything that might make someone trip, such as rocks or tools. ?Regularly check to see if handrails are loose or broken. Make sure that both sides of any steps have handrails. ?Any raised decks and porches should have guardrails on the edges. ?Have any leaves, snow, or ice cleared regularly. ?Use sand or salt on walking paths during winter. ?Clean up any spills in your garage right away. This includes oil or grease spills. ?What can I do in the bathroom? ?Use night lights. ?Install grab bars by the toilet and in the tub and shower. Do not use towel bars as grab bars. ?Use non-skid mats or decals in the tub or shower. ?If you need to sit down in the shower, use a plastic, non-slip stool. ?Keep the floor dry. Clean up any water that spills on the floor as soon as it happens. ?Remove soap buildup in the tub or shower regularly. ?Attach bath mats securely with double-sided non-slip rug tape. ?Do not have throw rugs and other things on the floor that can make you trip. ?What can I do in the bedroom? ?Use night lights. ?Make sure that you have a light by your bed that is easy to reach. ?Do not use  any sheets or blankets that are too big for your bed. They should not hang down onto the floor. ?Have a firm chair that has side arms. You can use this for support while you get dressed. ?Do not have throw rugs and other things on the floor that can make you trip. ?What can I do in the kitchen? ?Clean up any spills right away. ?Avoid walking on wet floors. ?Keep items that you use a lot in easy-to-reach places. ?If you need to reach something above you, use a strong step stool that has a grab bar. ?Keep electrical cords out of the way. ?Do not use floor polish or wax that makes floors slippery. If you must use wax, use non-skid floor wax. ?Do not have throw rugs and other things on the floor that can make you trip. ?What can I do with my stairs? ?Do not leave any items on the stairs. ?Make sure that there are handrails on both sides of the stairs and use them. Fix handrails that are broken or loose. Make sure that handrails are as long as the stairways. ?Check any carpeting to make sure that it is firmly attached to the stairs. Fix any carpet that is loose or worn. ?Avoid having throw rugs at the top or bottom of the stairs. If you do have throw rugs, attach them to the floor with carpet tape. ?  Make sure that you have a light switch at the top of the stairs and the bottom of the stairs. If you do not have them, ask someone to add them for you. ?What else can I do to help prevent falls? ?Wear shoes that: ?Do not have high heels. ?Have rubber bottoms. ?Are comfortable and fit you well. ?Are closed at the toe. Do not wear sandals. ?If you use a stepladder: ?Make sure that it is fully opened. Do not climb a closed stepladder. ?Make sure that both sides of the stepladder are locked into place. ?Ask someone to hold it for you, if possible. ?Clearly mark and make sure that you can see: ?Any grab bars or handrails. ?First and last steps. ?Where the edge of each step is. ?Use tools that help you move around (mobility aids)  if they are needed. These include: ?Canes. ?Walkers. ?Scooters. ?Crutches. ?Turn on the lights when you go into a dark area. Replace any light bulbs as soon as they burn out. ?Set up your furniture so you have a cl

## 2022-04-30 NOTE — Progress Notes (Signed)
? ?Subjective:  ? Diane [name] is a 71 y.o. female who presents for Medicare Annual (Subsequent) preventive examination. ? ? ?I connected with Diane [name]  today by telephone and verified that I am speaking with the correct person using two identifiers. ?Location patient: home ?Location provider: work ?Persons participating in the virtual visit: patient, provider. ?  ?I discussed the limitations, risks, security and privacy concerns of performing an evaluation and management service by telephone and the availability of in person appointments. I also discussed with the patient that there may be a patient responsible charge related to this service. The patient expressed understanding and verbally consented to this telephonic visit.  ?  ?Interactive audio and video telecommunications were attempted between this provider and patient, however failed, due to patient having technical difficulties OR patient did not have access to video capability.  We continued and completed visit with audio only. ? ?  ?Review of Systems    ? ?Cardiac Risk Factors include: advanced age (>19mn, >>91women);dyslipidemia ? ?   ?Objective:  ?  ?Today's Vitals  ? ?There is no height or weight on file to calculate BMI. ? ? ?  04/30/2022  ? 10:52 AM 01/08/2021  ? 10:45 AM 06/01/2020  ?  6:12 AM 12/25/2019  ?  7:21 PM 12/25/2019  ?  3:10 PM 10/12/2019  ?  1:13 PM 09/29/2018  ?  1:09 PM  ?Advanced Directives  ?Does Patient Have a Medical Advance Directive? Yes Yes No Yes No No No  ?Type of [place] Healthcare Power of [place]    ?Copy of HC[place] Chart? No - copy requested No - copy requested  No - copy requested     ?Would patient like information on creating a medical advance directive?   No - Patient declined  Yes (ED - Information included in AVS) No - Patient declined Yes (MAU/Ambulatory/Procedural Areas - Information given)   ? ? ?Current Medications (verified) ?Outpatient Encounter Medications as of 04/30/2022  ?Medication Sig  ? albuterol (VENTOLIN HFA) 108 (90 Base) MCG/ACT inhaler Inhale 1-2 puffs into the lungs every 6 (six) hours as needed for wheezing or shortness of breath.  ? amitriptyline (ELAVIL) 25 MG tablet TAKE 1 TABLET BY MOUTH EVERYDAY AT BEDTIME  ? atorvastatin (LIPITOR) 80 MG tablet TAKE 1 TABLET BY MOUTH EVERY DAY  ? benazepril-hydrochlorthiazide (LOTENSIN HCT) 20-12.5 MG tablet Take 1 tablet by mouth daily.  ? budesonide-formoterol (SYMBICORT) 80-4.5 MCG/ACT inhaler Inhale 2 puffs into the lungs 2 (two) times daily as needed.  ? butalbital-acetaminophen-caffeine (FIORICET) 50-325-40 MG tablet Take 1 tablet by mouth every 8 (eight) hours as needed for headache or migraine.  ? cholecalciferol (VITAMIN D) 1000 UNITS tablet Take 1,000 Units by mouth daily.  ? clopidogrel (PLAVIX) 75 MG tablet TAKE 1 TABLET BY MOUTH EVERY DAY  ? diazepam (VALIUM) 5 MG tablet Take 1 tablet (5 mg total) by mouth every 12 (twelve) hours as needed (anxiety, sleep).  ? diltiazem (CARDIZEM CD) 180 MG 24 hr capsule TAKE 1 CAPSULE BY MOUTH EVERY DAY  ? DULoxetine (CYMBALTA) 20 MG capsule Take 1 capsule (20 mg total) by mouth daily.  ? ezetimibe (ZETIA) 10 MG tablet Take 1 tablet (10 mg total) by mouth daily.  ? Multiple Minerals-Vitamins (CAL-MAG-ZINC-D) TABS Take by mouth.  ? nitroGLYCERIN (NITROSTAT) 0.4 MG SL tablet PLACE 1 TABLET (0.4 MG TOTAL) UNDER THE TONGUE EVERY 5 (FIVE) MINUTES AS  NEEDED FOR CHEST PAIN. MAXIMUM OF 3 DOSES.  ? pantoprazole (PROTONIX) 40 MG tablet Take 1 tablet (40 mg total) by mouth daily.  ? ranolazine (RANEXA) 500 MG 12 hr tablet TAKE 1 TABLET BY MOUTH TWICE A DAY  ? Rhubarb (ESTROVEN COMPLETE PO) Take 1 tablet by mouth daily.  ? valACYclovir (VALTREX) 1000 MG tablet Take 500 mg by mouth 2 (two) times daily as needed.  ? ?No facility-administered encounter medications on file as of 04/30/2022.  ? ? ?Allergies  (verified) ?Boniva [ibandronic acid], Fosamax [alendronate sodium], and Prolia [denosumab]  ? ?History: ?Past Medical History:  ?Diagnosis Date  ? Arthritis   ? CAD (coronary artery disease)   ? a. 09/2013 Cath: LM nl, LAD 30p, 58m LCX nl, RCA 30p, EF 55-60%; b. 04/2017 MV: EF>65%, apical defect ->breast attenuation. Sm. area of isch cannot be excluded; c. 04/2020 Cor CTA: Ca2+ = 704 (96%). Sev RCA/LAD dzs; d. 05/2020 Cath/PCI: LM nl, LAD [id number] LCX 20p, RCA 80p (DFR 0.88-->2.5x12 Synergy XD DES).  ? CVA (cerebral infarction) 1997  ? right sided weakeness and deaf in right ear  ? Deafness in right ear   ? from cva  ? Depression   ? Diastolic dysfunction   ? a. 01/2013 Echo: EF 55-60%, no rwma, Gr1 DD, PASP 86m42m b. 04/2017 Echo: EF 65-70%, no rwma, Gr1 DD.  ? GERD (gastroesophageal reflux disease)   ? Hyperlipidemia   ? Hypertension   ? PONV (postoperative nausea and vomiting)   ? 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 [facility]  ? deaf in right ear  ? Tubular adenoma of colon 2018  ? ?Past Surgical History:  ?Procedure Laterality Date  ? BREAST BIOPSY Left 1984  ? abcess  ? CARDIAC CATHETERIZATION    ? CESAREAN SECTION    ? CHOLECYSTECTOMY  1990  ? CHONDROPLASTY Left 01/05/2015  ? Procedure: CHONDROPLASTY;  Surgeon: W D Yvette RackD;  [place];  Laterality: Left;  ? COLONOSCOPY  05/17/2021  ? CORONARY STENT INTERVENTION N/A 06/01/2020  ? Procedure: CORONARY STENT INTERVENTION;  Surgeon: End,Nelva Bush;  Location: [facility];  Service: Cardiovascular;  Laterality: N/A;  ? INTRAVASCULAR PRESSURE WIRE/FFR STUDY N/A 06/01/2020  ? Procedure: INTRAVASCULAR PRESSURE WIRE/FFR STUDY;  Surgeon: End,Nelva Bush;  [facility];  Service: Cardiovascular;  Laterality: N/A;  ? KNEE ARTHROSCOPY WITH MEDIAL MENISECTOMY Left 01/05/2015  ? Procedure: LEFT KNEE ARTHROSCOPY WITH MEDIAL AND LATERAL MENISECTOMY; DEBRIDEMENT LATERAL PATELLA  FEMORAL ;  Surgeon: W D Yvette RackD;  Location: MO[facility]ervice: Orthopedics;  Laterality: Left;  ? LEFT HEART CATH AND CORONARY ANGIOGRAPHY N/A 06/01/2020  ? Procedure: LEFT HEART CATH AND CORONARY ANGIOGRAPHY;  Surgeon: End,Nelva Bush;  Location: [facility];  Service: Cardiovascular;  Laterality: N/A;  ? LEFT HEART CATHETERIZATION WITH CORONARY ANGIOGRAM N/A 10/28/2013  ? Procedure: LEFT HEART CATHETERIZATION WITH CORONARY ANGIOGRAM;  Surgeon: DaltLarey Dresser;  [facility];  Service: Cardiovascular;  Laterality: N/A;  ? TOTAL KNEE ARTHROPLASTY Left 07/06/2015  ? Procedure: TOTAL KNEE ARTHROPLASTY;  Surgeon: DaniEarlie Server;  Location: [facility] OTusculumervice: Orthopedics;  Laterality: Left;  ? TUBAL LIGATION    ? UPPER GASTROINTESTINAL ENDOSCOPY  05/17/2021  ? ?Family History  ?Problem Relation Age of Onset  ? Hypertension Mother   ? Stroke Mother   ? Breast cancer Mother   ?  Stroke Father   ? Heart failure Father   ? Heart disease Father   ?     CABG  ? Ovarian cancer Paternal Grandmother   ? Liver cancer Paternal Grandfather   ? Stroke Maternal Grandmother   ? Heart failure Maternal Grandmother   ? Heart failure Maternal Grandfather   ? Juvenile Diabetes Son   ? Healthy Son   ? Testicular cancer Son   ? Liver cancer Son   ? Colon cancer Son   ? Healthy Son   ? Hypertension Son   ? Healthy Son   ? Hypertension Son   ? Healthy Son   ? Healthy Daughter   ? Healthy Daughter   ? Healthy Daughter   ? Healthy Daughter   ? Stomach cancer Neg Hx   ? Rectal cancer Neg Hx   ? Esophageal cancer Neg Hx   ? ?Social History  ? ?Socioeconomic History  ? Marital status: Widowed  ?  Spouse name: [name]  ? Number of children: 8  ? Years of education: 68  ? Highest education level: Not on file  ?Occupational History  ? Occupation: Retired  ?Tobacco Use  ? Smoking status: Never  ? Smokeless tobacco: Never  ? Tobacco comments:  ?  LIVES WITH 2 SMOKERS   ?Vaping Use  ? Vaping Use: Never  used  ?Substance and Sexual Activity  ? Alcohol use: Not Currently  ?  Comment: 1 glass of wine per month  ? Drug use: No  ? Sexual activity: Yes  ?  Birth control/protection: Post-menopausal  ?Other Topics Concern  ? Not

## 2022-05-01 ENCOUNTER — Other Ambulatory Visit: Payer: Self-pay | Admitting: Nurse Practitioner

## 2022-05-01 DIAGNOSIS — F411 Generalized anxiety disorder: Secondary | ICD-10-CM

## 2022-05-07 ENCOUNTER — Encounter: Payer: Self-pay | Admitting: Nurse Practitioner

## 2022-05-07 ENCOUNTER — Telehealth (INDEPENDENT_AMBULATORY_CARE_PROVIDER_SITE_OTHER): Payer: Medicare HMO | Admitting: Nurse Practitioner

## 2022-05-07 VITALS — BP 112/76 | Ht 62.0 in | Wt 188.0 lb

## 2022-05-07 DIAGNOSIS — F339 Major depressive disorder, recurrent, unspecified: Secondary | ICD-10-CM | POA: Diagnosis not present

## 2022-05-07 DIAGNOSIS — F411 Generalized anxiety disorder: Secondary | ICD-10-CM

## 2022-05-07 MED ORDER — DULOXETINE HCL 20 MG PO CPEP: 20.0000 mg | ORAL_CAPSULE | Freq: Every day | ORAL | 3 refills | Status: DC

## 2022-05-07 NOTE — Progress Notes (Signed)
Virtual Visit via Video Note ? ?I connected withNAME@ on 05/07/22 at 10:00 AM EDT by a video enabled telemedicine application and verified that I am speaking with the correct person using two identifiers. ? ?Location: ?Patient:Home ?Provider: Office ?Participants: patient and provider ? ?I discussed the limitations of evaluation and management by telemedicine and the availability of in person appointments. ?I also discussed with the patient that there may be a patient responsible charge related to this service. The patient expressed understanding and agreed to proceed. ? ?MB:[id number] and depression f/up ? ?History of Present Illness: ?Depression, recurrent ([place]) ?Improved symptoms with cymbalta ?Denies any adverse effects. ? ?Maintain med doses ?F/up in 77month  ? ?Observations/Objective: ?Physical Exam ?Vitals reviewed.  ?Neurological:  ?   Mental Status: She is alert and oriented to person, place, and time.  ?Psychiatric:     ?   Mood and Affect: Mood normal.     ?   Behavior: Behavior normal.     ?   Thought Content: Thought content normal.  ?  ?Assessment and Plan: ?EDeneldawas seen today for follow-up. ? ?Diagnoses and all orders for this visit: ? ?Depression, recurrent ([place] ? ?Generalized anxiety disorder ?-     DULoxetine (CYMBALTA) 20 MG capsule; Take 1 capsule (20 mg total) by mouth daily. ? ? ?Follow Up Instructions: ?See instructions above ?  ?I discussed the assessment and treatment plan with the patient. The patient was provided an opportunity to ask questions and all were answered. The patient agreed with the plan and demonstrated an understanding of the instructions. ?  ?The patient was advised to call back or seek an in-person evaluation if the symptoms worsen or if the condition fails to improve as anticipated. ? ?[name]  ?

## 2022-05-07 NOTE — Assessment & Plan Note (Signed)
Improved symptoms with cymbalta ?Denies any adverse effects. ? ?Maintain med doses ?F/up in 60month ?

## 2022-05-08 ENCOUNTER — Other Ambulatory Visit: Payer: Self-pay | Admitting: Internal Medicine

## 2022-05-08 DIAGNOSIS — I25119 Atherosclerotic heart disease of native coronary artery with unspecified angina pectoris: Secondary | ICD-10-CM

## 2022-05-12 ENCOUNTER — Other Ambulatory Visit: Payer: Self-pay | Admitting: Internal Medicine

## 2022-05-12 ENCOUNTER — Telehealth: Payer: Self-pay

## 2022-05-12 NOTE — Telephone Encounter (Signed)
PA started through covermymeds for Ranolazine ER '500MG'$  er tablets.  ? ?[name] Key: Eileen Stanford - PA Case ID: [id number] ? ?After entering Demographic information the PA prompted  ? ?" Our records show the patient is taking butalbital-acetaminophen-caffeine 50 mg-325 mg-40 mg tablet that interacts (drug-interaction) with the requested drug." ? ?It will not let me move forward without putting something in the fill in blank.  ?

## 2022-05-12 NOTE — Telephone Encounter (Signed)
PA started though covermymeds.  ? ?Send message to Dr. Saunders Revel and Pharm D for a question verification.  ?

## 2022-05-13 ENCOUNTER — Telehealth: Payer: Self-pay

## 2022-05-13 NOTE — Telephone Encounter (Signed)
There is no drug interaction between these medications. ?

## 2022-05-13 NOTE — Telephone Encounter (Signed)
PA started through covermymeds ? ?[name] ?Key: [id number]  ?PA Case ID: [id number] ? ?Your information has been submitted to [facility]. [employer] will review the request and will issue a decision, typically within 3-7 days from your submission. You can check the updated outcome later by reopening this request. ? ?If [employer] has not responded in 3-7 days or if you have any questions about your ePA request, please contact [employer] at [phone]. If you think there may be a problem with your PA request, use our live chat feature at the bottom right. ? ?For [place] requests, please call [phone]. ?

## 2022-05-13 NOTE — Telephone Encounter (Signed)
Per fax received from [facility], ranolazine 500 mg tablets have been approved through 12/28/2022.  ?

## 2022-05-13 NOTE — Telephone Encounter (Signed)
Please advise below  ? ?Our records show the patient is taking butalbital-acetaminophen-caffeine 50 mg-325 mg-40 mg tablet that interacts (drug-interaction) with the requested drug. ?There is no drug interaction between these medications. ? ?2. Is the plan to keep the patient on both drugs despite an increased risk of an adverse outcome and education has been provided to the patient? ? Yes ? No ? ?3. If these drugs won't be discontinued, please provide additional documentation that would support the use of these drugs together: ?

## 2022-05-25 ENCOUNTER — Other Ambulatory Visit: Payer: Self-pay | Admitting: Internal Medicine

## 2022-06-06 ENCOUNTER — Other Ambulatory Visit: Payer: Self-pay | Admitting: Internal Medicine

## 2022-06-07 ENCOUNTER — Other Ambulatory Visit: Payer: Self-pay | Admitting: Nurse Practitioner

## 2022-06-07 DIAGNOSIS — R519 Headache, unspecified: Secondary | ICD-10-CM

## 2022-06-07 DIAGNOSIS — G8929 Other chronic pain: Secondary | ICD-10-CM

## 2022-06-09 NOTE — Telephone Encounter (Signed)
Chart supports rx Last OV: 03/2022 Next OV: 07/2022

## 2022-06-13 ENCOUNTER — Other Ambulatory Visit: Payer: Self-pay | Admitting: Internal Medicine

## 2022-06-13 ENCOUNTER — Other Ambulatory Visit: Payer: Self-pay | Admitting: Nurse Practitioner

## 2022-06-13 DIAGNOSIS — F411 Generalized anxiety disorder: Secondary | ICD-10-CM

## 2022-06-13 DIAGNOSIS — F339 Major depressive disorder, recurrent, unspecified: Secondary | ICD-10-CM

## 2022-06-27 ENCOUNTER — Telehealth: Payer: Self-pay

## 2022-06-27 ENCOUNTER — Other Ambulatory Visit: Payer: Self-pay

## 2022-06-27 DIAGNOSIS — Z0000 Encounter for general adult medical examination without abnormal findings: Secondary | ICD-10-CM

## 2022-06-27 NOTE — Telephone Encounter (Signed)
Contacted patient advising her that mammogram is due and to help assist with scheduling. Ordered mammogram and provided patient with [facility] [place] phone number [phone] for scheduling.

## 2022-06-29 ENCOUNTER — Other Ambulatory Visit: Payer: Self-pay | Admitting: Internal Medicine

## 2022-06-29 DIAGNOSIS — E785 Hyperlipidemia, unspecified: Secondary | ICD-10-CM

## 2022-07-03 ENCOUNTER — Other Ambulatory Visit: Payer: Self-pay | Admitting: Nurse Practitioner

## 2022-07-03 DIAGNOSIS — Z1231 Encounter for screening mammogram for malignant neoplasm of breast: Secondary | ICD-10-CM

## 2022-07-15 ENCOUNTER — Ambulatory Visit
Admission: RE | Admit: 2022-07-15 | Discharge: 2022-07-15 | Disposition: A | Discharge status: Home / Self Care | Payer: Medicare HMO | Source: Ambulatory Visit | Attending: Nurse Practitioner | Admitting: Nurse Practitioner

## 2022-07-15 DIAGNOSIS — Z1231 Encounter for screening mammogram for malignant neoplasm of breast: Secondary | ICD-10-CM | POA: Diagnosis not present

## 2022-07-24 ENCOUNTER — Other Ambulatory Visit: Payer: Self-pay | Admitting: Nurse Practitioner

## 2022-07-24 DIAGNOSIS — F411 Generalized anxiety disorder: Secondary | ICD-10-CM

## 2022-07-24 DIAGNOSIS — R519 Other chronic pain: Secondary | ICD-10-CM

## 2022-07-24 NOTE — Telephone Encounter (Signed)
Chart supports Rx Last OV: 03/2022 Next OV: 07/2022

## 2022-08-06 ENCOUNTER — Other Ambulatory Visit: Payer: Self-pay | Admitting: Internal Medicine

## 2022-08-06 DIAGNOSIS — I10 Essential (primary) hypertension: Secondary | ICD-10-CM

## 2022-08-07 ENCOUNTER — Ambulatory Visit: Payer: Medicare HMO | Admitting: Nurse Practitioner

## 2022-08-07 ENCOUNTER — Telehealth: Payer: Self-pay | Admitting: Nurse Practitioner

## 2022-08-07 NOTE — Telephone Encounter (Signed)
Pt called and her car would not start.

## 2022-08-07 NOTE — Telephone Encounter (Signed)
8.10.23 [place]

## 2022-08-12 ENCOUNTER — Encounter: Payer: Self-pay | Admitting: Nurse Practitioner

## 2022-08-12 NOTE — Telephone Encounter (Signed)
4th no show, pt later called and noted her car would not start, fee waived, final warning letter sent  No show 09/27/21, 10/07/21, 12/11/21, 08/07/22

## 2022-08-26 ENCOUNTER — Encounter: Payer: Self-pay | Admitting: Nurse Practitioner

## 2022-08-26 ENCOUNTER — Other Ambulatory Visit: Payer: Self-pay | Admitting: Nurse Practitioner

## 2022-08-26 ENCOUNTER — Ambulatory Visit (INDEPENDENT_AMBULATORY_CARE_PROVIDER_SITE_OTHER): Payer: Medicare HMO | Admitting: Nurse Practitioner

## 2022-08-26 VITALS — BP 116/76 | HR 84 | Temp 96.6°F | Ht 62.0 in | Wt 206.8 lb

## 2022-08-26 DIAGNOSIS — F411 Generalized anxiety disorder: Secondary | ICD-10-CM | POA: Diagnosis not present

## 2022-08-26 DIAGNOSIS — I10 Essential (primary) hypertension: Secondary | ICD-10-CM | POA: Diagnosis not present

## 2022-08-26 DIAGNOSIS — E785 Hyperlipidemia, unspecified: Secondary | ICD-10-CM | POA: Diagnosis not present

## 2022-08-26 DIAGNOSIS — F339 Major depressive disorder, recurrent, unspecified: Secondary | ICD-10-CM | POA: Diagnosis not present

## 2022-08-26 LAB — COMPREHENSIVE METABOLIC PANEL
ALT: 17 U/L (ref 0–35)
AST: 17 U/L (ref 0–37)
Albumin: 4.4 g/dL (ref 3.5–5.2)
Alkaline Phosphatase: 61 U/L (ref 39–117)
BUN: 10 mg/dL (ref 6–23)
CO2: 29 mEq/L (ref 19–32)
Calcium: 9.8 mg/dL (ref 8.4–10.5)
Chloride: 99 mEq/L (ref 96–112)
Creatinine, Ser: 0.83 mg/dL (ref 0.40–1.20)
GFR: 71.65 mL/min (ref >60.00)
Glucose, Bld: 110 mg/dL — ABNORMAL HIGH (ref 70–99)
Potassium: 3.7 mEq/L (ref 3.5–5.1)
Sodium: 137 mEq/L (ref 135–145)
Total Bilirubin: 0.6 mg/dL (ref 0.2–1.2)
Total Protein: 7.4 g/dL (ref 6.0–8.3)

## 2022-08-26 LAB — LIPID PANEL
Cholesterol: 161 mg/dL (ref 0–200)
HDL: 70.3 mg/dL (ref >39.00)
LDL Cholesterol: 72 mg/dL (ref 0–99)
NonHDL: 90.91
Total CHOL/HDL Ratio: 2
Triglycerides: 93 mg/dL (ref 0.0–149.0)
VLDL: 18.6 mg/dL (ref 0.0–40.0)

## 2022-08-26 MED ORDER — DIAZEPAM 5 MG PO TABS: 5.0000 mg | ORAL_TABLET | Freq: Two times a day (BID) | ORAL | 0 refills | Status: DC | PRN

## 2022-08-26 MED ORDER — DULOXETINE HCL 40 MG PO CPEP: 40.0000 mg | ORAL_CAPSULE | Freq: Every day | ORAL | 1 refills | Status: DC

## 2022-08-26 NOTE — Assessment & Plan Note (Signed)
Increased cymbalta to '40mg'$  daily F/up in 66month

## 2022-08-26 NOTE — Assessment & Plan Note (Signed)
Repeat lipid panel ?

## 2022-08-26 NOTE — Patient Instructions (Addendum)
Increase cymbalta to '40mg'$  daily Maintain valium dose Go to lab

## 2022-08-26 NOTE — Progress Notes (Signed)
Established Patient Visit  Patient: Diane [name]   DOB: [birth year]   71 y.o. Female  MRN: [id number] Visit Date: 08/29/2022  Subjective:    Chief Complaint  Patient presents with   Office Visit    Anxiety F/u No concerns    HPI Generalized anxiety disorder Some improvement with cymbalta '20mg'$  Denies adverse effects with medication Started grief counseling through her church.  Increased cymbalta to '30mg'$  Maintain valium dose F/up in 68month Depression, recurrent (HCC) Increased cymbalta to '40mg'$  daily F/up in 145monthHyperlipidemia LDL goal <70 Repeat lipid panel  Essential hypertension BP at goal  Reviewed medical, surgical, and social history today  Medications: Outpatient Medications Prior to Visit  Medication Sig   albuterol (VENTOLIN HFA) 108 (90 Base) MCG/ACT inhaler Inhale 1-2 puffs into the lungs every 6 (six) hours as needed for wheezing or shortness of breath.   amitriptyline (ELAVIL) 25 MG tablet TAKE 1 TABLET BY MOUTH EVERYDAY AT BEDTIME   atorvastatin (LIPITOR) 80 MG tablet TAKE 1 TABLET BY MOUTH EVERY DAY   benazepril-hydrochlorthiazide (LOTENSIN HCT) 20-12.5 MG tablet TAKE 1 TABLET BY MOUTH EVERY DAY   budesonide-formoterol (SYMBICORT) 80-4.5 MCG/ACT inhaler Inhale 2 puffs into the lungs 2 (two) times daily as needed.   butalbital-acetaminophen-caffeine (FIORICET) 50-325-40 MG tablet TAKE 1 TABLET BY MOUTH EVERY 8 (EIGHT) HOURS AS NEEDED FOR HEADACHE OR MIGRAINE.   cholecalciferol (VITAMIN D) 1000 UNITS tablet Take 1,000 Units by mouth daily.   clopidogrel (PLAVIX) 75 MG tablet TAKE 1 TABLET BY MOUTH EVERY DAY   ezetimibe (ZETIA) 10 MG tablet TAKE 1 TABLET BY MOUTH EVERY DAY   nitroGLYCERIN (NITROSTAT) 0.4 MG SL tablet PLACE 1 TABLET (0.4 MG TOTAL) UNDER THE TONGUE EVERY 5 (FIVE) MINUTES AS NEEDED FOR CHEST PAIN. MAXIMUM OF 3 DOSES.   pantoprazole (PROTONIX) 40 MG tablet Take 1 tablet (40 mg total) by mouth daily.   ranolazine (RANEXA)  500 MG 12 hr tablet TAKE 1 TABLET BY MOUTH TWICE A DAY   Rhubarb (ESTROVEN COMPLETE PO) Take 1 tablet by mouth daily.   valACYclovir (VALTREX) 1000 MG tablet Take 500 mg by mouth 2 (two) times daily as needed.   [DISCONTINUED] diazepam (VALIUM) 5 MG tablet TAKE 1 TABLET (5 MG TOTAL) BY MOUTH EVERY 12 (TWELVE) HOURS AS NEEDED (ANXIETY, SLEEP).   [DISCONTINUED] DULoxetine (CYMBALTA) 20 MG capsule Take 1 capsule (20 mg total) by mouth daily.   diltiazem (CARDIZEM CD) 180 MG 24 hr capsule TAKE 1 CAPSULE BY MOUTH EVERY DAY (Patient not taking: Reported on 08/26/2022)   Multiple Minerals-Vitamins (CAL-MAG-ZINC-D) TABS Take by mouth. (Patient not taking: Reported on 08/26/2022)   No facility-administered medications prior to visit.   Reviewed past medical and social history.   ROS per HPI above      Objective:  BP 116/76 (BP Location: Right Arm, Patient Position: Sitting, Cuff Size: Normal)   Pulse 84   Temp (!) 96.6 F (35.9 C) (Temporal)   Ht '5\' 2"'$  (1.575 m)   Wt 206 lb 12.8 oz (93.8 kg)   SpO2 96%   BMI 37.82 kg/m      Physical Exam Psychiatric:        Attention and Perception: Attention normal.        Mood and Affect: Mood is anxious.        Speech: Speech normal.        Behavior: Behavior is cooperative.  Thought Content: Thought content normal.        Cognition and Memory: Cognition and memory normal.     Results for orders placed or performed in visit on 08/26/22  Lipid panel  Result Value Ref Range   Cholesterol 161 0 - 200 mg/dL   Triglycerides 93.0 0.0 - 149.0 mg/dL   HDL 70.30 >39.00 mg/dL   VLDL 18.6 0.0 - 40.0 mg/dL   LDL Cholesterol 72 0 - 99 mg/dL   Total CHOL/HDL Ratio 2    NonHDL 90.91   Comprehensive metabolic panel  Result Value Ref Range   Sodium 137 135 - 145 mEq/L   Potassium 3.7 3.5 - 5.1 mEq/L   Chloride 99 96 - 112 mEq/L   CO2 29 19 - 32 mEq/L   Glucose, Bld 110 (H) 70 - 99 mg/dL   BUN 10 6 - 23 mg/dL   Creatinine, Ser 0.83 0.40 - 1.20  mg/dL   Total Bilirubin 0.6 0.2 - 1.2 mg/dL   Alkaline Phosphatase 61 39 - 117 U/L   AST 17 0 - 37 U/L   ALT 17 0 - 35 U/L   Total Protein 7.4 6.0 - 8.3 g/dL   Albumin 4.4 3.5 - 5.2 g/dL   GFR 71.65 >60.00 mL/min   Calcium 9.8 8.4 - 10.5 mg/dL      Assessment & Plan:    Problem List Items Addressed This Visit       Cardiovascular and Mediastinum   Essential hypertension    BP at goal      Relevant Orders   Comprehensive metabolic panel (Completed)     Other   Depression, recurrent (HCC)    Increased cymbalta to '40mg'$  daily F/up in 22month     Relevant Medications   diazepam (VALIUM) 5 MG tablet   Generalized anxiety disorder - Primary    Some improvement with cymbalta '20mg'$  Denies adverse effects with medication Started grief counseling through her church.  Increased cymbalta to '30mg'$  Maintain valium dose F/up in 155month    Relevant Medications   diazepam (VALIUM) 5 MG tablet   Hyperlipidemia LDL goal <70    Repeat lipid panel      Relevant Orders   Lipid panel (Completed)   Comprehensive metabolic panel (Completed)   Return in about 4 weeks (around 09/23/2022) for depression and anxiety.     ChWilfred LacyNP

## 2022-08-26 NOTE — Assessment & Plan Note (Signed)
BP at goal 

## 2022-08-26 NOTE — Assessment & Plan Note (Addendum)
Some improvement with cymbalta '20mg'$  Denies adverse effects with medication Started grief counseling through her church.  Increased cymbalta to '30mg'$  Maintain valium dose F/up in 1month

## 2022-09-04 ENCOUNTER — Other Ambulatory Visit: Payer: Self-pay | Admitting: Internal Medicine

## 2022-09-10 ENCOUNTER — Other Ambulatory Visit: Payer: Self-pay | Admitting: Internal Medicine

## 2022-09-10 DIAGNOSIS — I25119 Atherosclerotic heart disease of native coronary artery with unspecified angina pectoris: Secondary | ICD-10-CM

## 2022-09-26 ENCOUNTER — Encounter: Payer: Self-pay | Admitting: Nurse Practitioner

## 2022-09-26 ENCOUNTER — Other Ambulatory Visit: Payer: Self-pay | Admitting: Internal Medicine

## 2022-09-26 ENCOUNTER — Ambulatory Visit (INDEPENDENT_AMBULATORY_CARE_PROVIDER_SITE_OTHER): Payer: Medicare HMO | Admitting: Nurse Practitioner

## 2022-09-26 VITALS — BP 130/82 | HR 57 | Temp 95.8°F | Ht 62.0 in | Wt 208.2 lb

## 2022-09-26 DIAGNOSIS — Z23 Encounter for immunization: Secondary | ICD-10-CM | POA: Diagnosis not present

## 2022-09-26 DIAGNOSIS — E041 Nontoxic single thyroid nodule: Secondary | ICD-10-CM

## 2022-09-26 DIAGNOSIS — R519 Headache, unspecified: Secondary | ICD-10-CM | POA: Diagnosis not present

## 2022-09-26 DIAGNOSIS — F411 Generalized anxiety disorder: Secondary | ICD-10-CM

## 2022-09-26 DIAGNOSIS — G8929 Other chronic pain: Secondary | ICD-10-CM

## 2022-09-26 DIAGNOSIS — E785 Hyperlipidemia, unspecified: Secondary | ICD-10-CM

## 2022-09-26 DIAGNOSIS — F339 Major depressive disorder, recurrent, unspecified: Secondary | ICD-10-CM | POA: Diagnosis not present

## 2022-09-26 MED ORDER — DIAZEPAM 5 MG PO TABS
5.0000 mg | ORAL_TABLET | Freq: Two times a day (BID) | ORAL | 2 refills | Status: DC | PRN
Start: 2022-09-26 — End: 2023-01-01

## 2022-09-26 MED ORDER — BUTALBITAL-APAP-CAFFEINE 50-325-40 MG PO TABS: 1.0000 | ORAL_TABLET | Freq: Every day | ORAL | 0 refills | Status: DC | PRN

## 2022-09-26 NOTE — Patient Instructions (Signed)
Maintain med doses Start silver sneakers and water aerobic program. Let me know if you want referral to psychologist.

## 2022-09-26 NOTE — Assessment & Plan Note (Signed)
Use of fioricet 1-2x/week Trigger: change in weather. refill sent.

## 2022-09-26 NOTE — Progress Notes (Signed)
Established Patient Visit  Patient: Diane [name]   DOB: 06-28-1951   71 y.o. Female  MRN: [id number] Visit Date: 09/26/2022  Subjective:    Chief Complaint  Patient presents with   Office Visit    Anxiety/ depression  Says new meds have been working better for her  Flu shot given today    HPI Generalized anxiety disorder Improved mood with cymbalta '30mg'$  in Am, elavil '25mg'$ , and valium '5mg'$  BID. Denies any adverse side effects. Denies need for psychology appt.  Maintain current med doses Encouraged to engage in senior activities e.g. silver sneakers or water aerobics F/up in 19month  Chronic headaches Use of fioricet 1-2x/week Trigger: change in weather. refill sent.  Reviewed medical, surgical, and social history today  Medications: Outpatient Medications Prior to Visit  Medication Sig   albuterol (VENTOLIN HFA) 108 (90 Base) MCG/ACT inhaler Inhale 1-2 puffs into the lungs every 6 (six) hours as needed for wheezing or shortness of breath.   amitriptyline (ELAVIL) 25 MG tablet TAKE 1 TABLET BY MOUTH EVERYDAY AT BEDTIME   atorvastatin (LIPITOR) 80 MG tablet TAKE 1 TABLET BY MOUTH EVERY DAY   benazepril-hydrochlorthiazide (LOTENSIN HCT) 20-12.5 MG tablet TAKE 1 TABLET BY MOUTH EVERY DAY   budesonide-formoterol (SYMBICORT) 80-4.5 MCG/ACT inhaler Inhale 2 puffs into the lungs 2 (two) times daily as needed.   cholecalciferol (VITAMIN D) 1000 UNITS tablet Take 1,000 Units by mouth daily.   clopidogrel (PLAVIX) 75 MG tablet TAKE 1 TABLET BY MOUTH EVERY DAY   diltiazem (CARDIZEM CD) 180 MG 24 hr capsule TAKE 1 CAPSULE BY MOUTH EVERY DAY   DULoxetine (CYMBALTA) 30 MG capsule Take 1 capsule (30 mg total) by mouth daily.   ezetimibe (ZETIA) 10 MG tablet TAKE 1 TABLET BY MOUTH EVERY DAY   nitroGLYCERIN (NITROSTAT) 0.4 MG SL tablet PLACE 1 TABLET (0.4 MG TOTAL) UNDER THE TONGUE EVERY 5 (FIVE) MINUTES AS NEEDED FOR CHEST PAIN. MAXIMUM OF 3 DOSES.   pantoprazole  (PROTONIX) 40 MG tablet Take 1 tablet (40 mg total) by mouth daily.   ranolazine (RANEXA) 500 MG 12 hr tablet TAKE 1 TABLET BY MOUTH TWICE A DAY   Rhubarb (ESTROVEN COMPLETE PO) Take 1 tablet by mouth daily.   valACYclovir (VALTREX) 1000 MG tablet Take 500 mg by mouth 2 (two) times daily as needed.   [DISCONTINUED] butalbital-acetaminophen-caffeine (FIORICET) 50-325-40 MG tablet TAKE 1 TABLET BY MOUTH EVERY 8 (EIGHT) HOURS AS NEEDED FOR HEADACHE OR MIGRAINE.   [DISCONTINUED] diazepam (VALIUM) 5 MG tablet Take 1 tablet (5 mg total) by mouth every 12 (twelve) hours as needed (anxiety, sleep).   [DISCONTINUED] Multiple Minerals-Vitamins (CAL-MAG-ZINC-D) TABS Take by mouth. (Patient not taking: Reported on 08/26/2022)   No facility-administered medications prior to visit.   Reviewed past medical and social history.   ROS per HPI above      Objective:  BP 130/82 (BP Location: Right Arm, Patient Position: Sitting, Cuff Size: Normal)   Pulse (!) 57   Temp (!) 95.8 F (35.4 C) (Temporal)   Ht '5\' 2"'$  (1.575 m)   Wt 208 lb 3.2 oz (94.4 kg)   SpO2 98%   BMI 38.08 kg/m      Physical Exam Vitals reviewed.  Constitutional:      Appearance: She is obese.  Cardiovascular:     Rate and Rhythm: Normal rate and regular rhythm.     Pulses: Normal pulses.  Heart sounds: Normal heart sounds.  Pulmonary:     Effort: Pulmonary effort is normal.     Breath sounds: Normal breath sounds.  Neurological:     Mental Status: She is alert and oriented to person, place, and time.  Psychiatric:        Mood and Affect: Mood normal.        Behavior: Behavior normal.        Thought Content: Thought content normal.     No results found for any visits on 09/26/22.    Assessment & Plan:    Problem List Items Addressed This Visit       Endocrine   Thyroid nodule   Relevant Orders   US THYROID     Other   Chronic headaches    Use of fioricet 1-2x/week Trigger: change in weather. refill sent.       Relevant Medications   butalbital-acetaminophen-caffeine (FIORICET) 50-325-40 MG tablet   Depression, recurrent (HCC)   Relevant Medications   diazepam (VALIUM) 5 MG tablet   Generalized anxiety disorder - Primary    Improved mood with cymbalta '30mg'$  in Am, elavil '25mg'$ , and valium '5mg'$  BID. Denies any adverse side effects. Denies need for psychology appt.  Maintain current med doses Encouraged to engage in senior activities e.g. silver sneakers or water aerobics F/up in 27month      Relevant Medications   diazepam (VALIUM) 5 MG tablet   Other Visit Diagnoses     Need for immunization against influenza       Relevant Orders   Flu Vaccine QUAD High Dose(Fluad) (Completed)      Return in about 3 months (around 12/26/2022) for depression and anxiety, hyperlipidemia (fasting).     [name]

## 2022-09-26 NOTE — Assessment & Plan Note (Signed)
Improved mood with cymbalta '30mg'$  in Am, elavil '25mg'$ , and valium '5mg'$  BID. Denies any adverse side effects. Denies need for psychology appt.  Maintain current med doses Encouraged to engage in senior activities e.g. silver sneakers or water aerobics F/up in 59month

## 2022-10-03 ENCOUNTER — Encounter: Payer: Self-pay | Admitting: Nurse Practitioner

## 2022-10-06 ENCOUNTER — Ambulatory Visit
Admission: RE | Admit: 2022-10-06 | Discharge: 2022-10-06 | Disposition: A | Discharge status: Home / Self Care | Payer: Medicare HMO | Source: Ambulatory Visit | Attending: Nurse Practitioner | Admitting: Nurse Practitioner

## 2022-10-06 DIAGNOSIS — E041 Nontoxic single thyroid nodule: Secondary | ICD-10-CM

## 2022-10-06 DIAGNOSIS — E010 Iodine-deficiency related diffuse (endemic) goiter: Secondary | ICD-10-CM | POA: Diagnosis not present

## 2022-10-08 ENCOUNTER — Ambulatory Visit: Payer: Medicare HMO | Admitting: Internal Medicine

## 2022-10-08 NOTE — Progress Notes (Deleted)
Follow-up Outpatient Visit Date: 10/08/2022  Primary Care Provider: Flossie Buffy, NP [place] [address]  Chief Complaint: ***  HPI:  Diane [name] is a 71 y.o. female with history of CAD status post PCI to proximal RCA (01/2024), diastolic dysfunction, HTN, HLD, stroke, and GERD, who presents for follow-up of coronary artery disease and HFpEF.  I last saw her in April, at which time she was feeling fairly well, having recovered from her ankle fracture.  She reported a single episode of left upper chest pain that occurred after eating and resolved with belching.  Chronic exertional dyspnea was unchanged.  We did not make any medication changes or pursue additional testing.  --------------------------------------------------------------------------------------------------  Past Medical History:  Diagnosis Date   Arthritis    CAD (coronary artery disease)    a. 09/2013 Cath: LM nl, LAD 30p, 18m LCX nl, RCA 30p, EF 55-60%; b. 04/2017 MV: EF>65%, apical defect ->breast attenuation. Sm. area of isch cannot be excluded; c. 04/2020 Cor CTA: Ca2+ = 704 (96%). Sev RCA/LAD dzs; d. 05/2020 Cath/PCI: LM nl, LAD 386m4561m LCX 20p, RCA 80p (DFR 0.88-->2.5x12 Synergy XD DES).   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 01/2013 Echo: EF 55-60%, no rwma, Gr1 DD, PASP 46m49m b. 04/2017 Echo: EF 65-70%, no rwma, Gr1 DD.   GERD (gastroesophageal reflux disease)    Hyperlipidemia    Hypertension    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/2015   basal cell   [facility]   deaf in right ear   Tubular adenoma of colon 2018   Past Surgical History:  Procedure Laterality Date   BREAST BIOPSY Left 1984   abcess   [place]Y Left 01/05/2015   Procedure:  CHONDROPLASTY;  Surgeon: W D Yvette RackD;  [place];  Laterality: Left;   COLONOSCOPY  05/17/2021   CORONARY STENT INTERVENTION N/A 06/01/2020   Procedure: CORONARY STENT INTERVENTION;  Surgeon: Aspynn Clover,Nelva Bush;  [facility];  Service: Cardiovascular;  Laterality: N/A;   INTRAVASCULAR PRESSURE WIRE/FFR STUDY N/A 06/01/2020   Procedure: INTRAVASCULAR PRESSURE WIRE/FFR STUDY;  Surgeon: Micheala Morissette,Nelva Bush;  [facility];  Service: Cardiovascular;  Laterality: N/A;   KNEE ARTHROSCOPY WITH MEDIAL MENISECTOMY Left 01/05/2015   Procedure: LEFT KNEE ARTHROSCOPY WITH MEDIAL AND LATERAL MENISECTOMY; DEBRIDEMENT LATERAL PATELLA FEMORAL ;  Surgeon: W D Yvette RackD;  [place];  Laterality: Left;   LEFT HEART CATH AND CORONARY ANGIOGRAPHY N/A 06/01/2020   Procedure: LEFT HEART CATH AND CORONARY ANGIOGRAPHY;  Surgeon: Neeka Urista,Nelva Bush;  [facility];  Service: Cardiovascular;  Laterality: N/A;   LEFT HEART CATHETERIZATION WITH CORONARY ANGIOGRAM N/A 10/28/2013   Procedure: LEFT HEART CATHETERIZATION WITH CORONARY ANGIOGRAM;  Surgeon: DaltLarey Dresser;  [facility];  Service: Cardiovascular;  Laterality: N/A;   TOTAL KNEE ARTHROPLASTY Left 07/06/2015   Procedure: TOTAL KNEE ARTHROPLASTY;  Surgeon: DaniEarlie Server;  [facility] [place];  Laterality: Left;   TUBAL LIGATION     UPPER GASTROINTESTINAL ENDOSCOPY  05/17/2021  No outpatient medications have been marked as taking for the 10/08/22 encounter (Appointment) with Juan Olthoff, Harrell Gave, MD.    Allergies: Boniva [ibandronic acid], Fosamax [alendronate sodium], and Prolia [denosumab]  Social History   Tobacco Use   Smoking status: Never   Smokeless tobacco: Never   Tobacco comments:    LIVES WITH 2 SMOKERS   Vaping Use   Vaping Use: Never used  Substance Use  Topics   Alcohol use: Not Currently    Comment: 1 glass of wine per month   Drug use: No    Family History  Problem Relation Age of Onset   Hypertension Mother    Stroke Mother    Breast cancer Mother    Stroke Father    Heart failure Father    Heart disease Father        CABG   Ovarian cancer Paternal Grandmother    Liver cancer Paternal Grandfather    Stroke Maternal Grandmother    Heart failure Maternal Grandmother    Heart failure Maternal Grandfather    Juvenile Diabetes Son    Healthy Son    Testicular cancer Son    Liver cancer Son    Colon cancer Son    Healthy Son    Hypertension Son    Healthy Son    Hypertension Son    Healthy Son    Healthy Daughter    Healthy Daughter    Healthy Daughter    Healthy Daughter    Stomach cancer Neg Hx    Rectal cancer Neg Hx    Esophageal cancer Neg Hx     Review of Systems: A 12-system review of systems was performed and was negative except as noted in the HPI.  --------------------------------------------------------------------------------------------------  Physical Exam: There were no vitals taken for this visit.  General:  NAD. Neck: No JVD or HJR. Lungs: Clear to auscultation bilaterally without wheezes or crackles. Heart: Regular rate and rhythm without murmurs, rubs, or gallops. Abdomen: Soft, nontender, nondistended. Extremities: No lower extremity edema.  EKG:  ***  Lab Results  Component Value Date   WBC 4.5 05/14/2021   HGB 14.3 05/14/2021   HCT 43.6 05/14/2021   MCV 99 (H) 05/14/2021   PLT 196 05/14/2021    Lab Results  Component Value Date   NA 137 08/26/2022   K 3.7 08/26/2022   CL 99 08/26/2022   CO2 29 08/26/2022   BUN 10 08/26/2022   CREATININE 0.83 08/26/2022   GLUCOSE 110 (H) 08/26/2022   ALT 17 08/26/2022    Lab Results  Component Value Date   CHOL 161 08/26/2022   HDL 70.30 08/26/2022   LDLCALC 72 08/26/2022   LDLDIRECT 59 07/13/2020   TRIG 93.0 08/26/2022   CHOLHDL 2  08/26/2022    --------------------------------------------------------------------------------------------------  ASSESSMENT AND PLAN: Harrell Gave Lanny Lipkin, MD 10/08/2022 8:32 AM

## 2022-10-09 ENCOUNTER — Encounter: Payer: Self-pay | Admitting: Internal Medicine

## 2022-10-09 ENCOUNTER — Telehealth (HOSPITAL_COMMUNITY): Payer: Self-pay | Admitting: *Deleted

## 2022-10-09 ENCOUNTER — Ambulatory Visit: Payer: Medicare HMO | Attending: Internal Medicine | Admitting: Internal Medicine

## 2022-10-09 VITALS — BP 144/80 | HR 87 | Ht 61.5 in | Wt 208.0 lb

## 2022-10-09 DIAGNOSIS — I10 Essential (primary) hypertension: Secondary | ICD-10-CM | POA: Diagnosis not present

## 2022-10-09 DIAGNOSIS — J449 Chronic obstructive pulmonary disease, unspecified: Secondary | ICD-10-CM

## 2022-10-09 DIAGNOSIS — R079 Chest pain, unspecified: Secondary | ICD-10-CM

## 2022-10-09 DIAGNOSIS — E785 Hyperlipidemia, unspecified: Secondary | ICD-10-CM | POA: Diagnosis not present

## 2022-10-09 DIAGNOSIS — I25118 Atherosclerotic heart disease of native coronary artery with other forms of angina pectoris: Secondary | ICD-10-CM

## 2022-10-09 NOTE — Telephone Encounter (Signed)

## 2022-10-09 NOTE — Progress Notes (Signed)
Follow-up Outpatient Visit Date: 10/09/2022  Primary Care Provider: Flossie Buffy, NP [place] [phone]  Chief Complaint: Wheezing and shortness of breath  HPI:  Diane [name] is a 71 y.o. female with history of CAD status post PCI to proximal RCA (05/2262), diastolic dysfunction, HTN, HLD, stroke, and GERD, who presents for follow-up of coronary artery disease and HFpEF.  I last saw her in April, at which time she was feeling fairly well, having recovered from her ankle fracture.  She reported a single episode of left upper chest pain that occurred after eating and resolved with belching.  Chronic exertional dyspnea was unchanged.  We did not make any medication changes or pursue additional testing.  Today, Diane [name] reports that she woke up a little short of breath and wheezy.  She took her rescue inhaler and feels like the dyspnea and wheezing have improved but have not totally resolved.  She notes a few similar episodes over the last week.  She has not had any anginal chest pain reminiscent of what she felt before her PCI in 2021, though she has experienced some achiness in the left upper chest just below the collarbone as well as some heaviness in both arms.  She denies palpitations, lightheadedness, and edema.  She chronically sleeps on 3 pillows, which has not changed.  She has been coughing a little bit more recently.  She has not seen her pulmonologist in a while.  Her weight is up 10 pounds since our last visit though it is fairly stable since late August.  --------------------------------------------------------------------------------------------------  Past Medical History:  Diagnosis Date   Arthritis    CAD (coronary artery disease)    a. 09/2013 Cath: LM nl, LAD 30p, 29m LCX nl, RCA 30p, EF 55-60%; b. 04/2017 MV: EF>65%, apical defect ->breast attenuation. Sm. area of isch cannot be excluded; c. 04/2020 Cor CTA: Ca2+ = 704 (96%). Sev RCA/LAD dzs; d.  05/2020 Cath/PCI: LM nl, LAD 340m4573m LCX 20p, RCA 80p (DFR 0.88-->2.5x12 Synergy XD DES).   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 01/2013 Echo: EF 55-60%, no rwma, Gr1 DD, PASP 91m13m b. 04/2017 Echo: EF 65-70%, no rwma, Gr1 DD.   GERD (gastroesophageal reflux disease)    Hyperlipidemia    Hypertension    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/2015   basal cell   [facility]   deaf in right ear   Tubular adenoma of colon 2018   Past Surgical History:  Procedure Laterality Date   BREAST BIOPSY Left 1984   abcess   [place]ONDROPLASTY Left 01/05/2015   Procedure: CHONDROPLASTY;  Surgeon: W D Yvette RackD;  [place];  Laterality: Left;   COLONOSCOPY  05/17/2021   CORONARY STENT INTERVENTION N/A 06/01/2020   Procedure: CORONARY STENT INTERVENTION;  Surgeon: Naja Apperson,Nelva Bush;  [facility];  Service: Cardiovascular;  Laterality: N/A;   INTRAVASCULAR PRESSURE WIRE/FFR STUDY N/A 06/01/2020   Procedure: INTRAVASCULAR PRESSURE WIRE/FFR STUDY;  Surgeon: Naviyah Schaffert,Nelva Bush;  [facility];  Service: Cardiovascular;  Laterality: N/A;   KNEE ARTHROSCOPY WITH MEDIAL MENISECTOMY Left 01/05/2015  Procedure: LEFT KNEE ARTHROSCOPY WITH MEDIAL AND LATERAL MENISECTOMY; DEBRIDEMENT LATERAL PATELLA FEMORAL ;  Surgeon: Yvette Rack., MD;  Location: [place];  Service: Orthopedics;  Laterality: Left;   LEFT HEART CATH AND CORONARY ANGIOGRAPHY N/A 06/01/2020   Procedure: LEFT HEART CATH AND CORONARY ANGIOGRAPHY;  Surgeon: Nelva Bush, MD;  Location: [place] CV LAB;  Service: Cardiovascular;  Laterality: N/A;   LEFT HEART CATHETERIZATION WITH CORONARY ANGIOGRAM N/A 10/28/2013    Procedure: LEFT HEART CATHETERIZATION WITH CORONARY ANGIOGRAM;  Surgeon: Larey Dresser, MD;  Location: [facility] CATH LAB;  Service: Cardiovascular;  Laterality: N/A;   TOTAL KNEE ARTHROPLASTY Left 07/06/2015   Procedure: TOTAL KNEE ARTHROPLASTY;  Surgeon: Earlie Server, MD;  Location: [place];  Service: Orthopedics;  Laterality: Left;   TUBAL LIGATION     UPPER GASTROINTESTINAL ENDOSCOPY  05/17/2021    Current Meds  Medication Sig   albuterol (VENTOLIN HFA) 108 (90 Base) MCG/ACT inhaler Inhale 1-2 puffs into the lungs every 6 (six) hours as needed for wheezing or shortness of breath.   amitriptyline (ELAVIL) 25 MG tablet TAKE 1 TABLET BY MOUTH EVERYDAY AT BEDTIME   atorvastatin (LIPITOR) 80 MG tablet TAKE 1 TABLET BY MOUTH EVERY DAY   benazepril-hydrochlorthiazide (LOTENSIN HCT) 20-12.5 MG tablet TAKE 1 TABLET BY MOUTH EVERY DAY   budesonide-formoterol (SYMBICORT) 80-4.5 MCG/ACT inhaler Inhale 2 puffs into the lungs 2 (two) times daily as needed.   butalbital-acetaminophen-caffeine (FIORICET) 50-325-40 MG tablet Take 1 tablet by mouth daily as needed for headache or migraine.   cholecalciferol (VITAMIN D) 1000 UNITS tablet Take 1,000 Units by mouth daily.   clopidogrel (PLAVIX) 75 MG tablet TAKE 1 TABLET BY MOUTH EVERY DAY   diazepam (VALIUM) 5 MG tablet Take 1 tablet (5 mg total) by mouth every 12 (twelve) hours as needed (anxiety, sleep).   diltiazem (CARDIZEM CD) 180 MG 24 hr capsule TAKE 1 CAPSULE BY MOUTH EVERY DAY   DULoxetine (CYMBALTA) 30 MG capsule Take 1 capsule (30 mg total) by mouth daily.   ezetimibe (ZETIA) 10 MG tablet TAKE 1 TABLET BY MOUTH EVERY DAY   nitroGLYCERIN (NITROSTAT) 0.4 MG SL tablet PLACE 1 TABLET (0.4 MG TOTAL) UNDER THE TONGUE EVERY 5 (FIVE) MINUTES AS NEEDED FOR CHEST PAIN. MAXIMUM OF 3 DOSES.   pantoprazole (PROTONIX) 40 MG tablet Take 1 tablet (40 mg total) by mouth daily.   ranolazine (RANEXA) 500 MG 12 hr tablet TAKE 1 TABLET BY MOUTH TWICE A DAY   Rhubarb  (ESTROVEN COMPLETE PO) Take 1 tablet by mouth daily.   valACYclovir (VALTREX) 1000 MG tablet Take 500 mg by mouth 2 (two) times daily as needed.    Allergies: Boniva [ibandronic acid], Fosamax [alendronate sodium], and Prolia [denosumab]  Social History   Tobacco Use   Smoking status: Never   Smokeless tobacco: Never   Tobacco comments:    LIVES WITH 2 SMOKERS   Vaping Use   Vaping Use: Never used  Substance Use Topics   Alcohol use: Not Currently    Comment: 1 glass of wine per month   Drug use: No    Family History  Problem Relation Age of Onset   Hypertension Mother    Stroke Mother    Breast cancer Mother    Stroke Father    Heart failure Father    Heart disease Father        CABG   Stroke Maternal Grandmother    Heart failure Maternal Grandmother    Heart  failure Maternal Grandfather    Ovarian cancer Paternal Grandmother    Liver cancer Paternal Programmer[employer] Daughter    Healthy Daughter    Healthy Daughter    Healthy Daughter    Juvenile Diabetes Son    Healthy Son    Testicular cancer Son    Liver cancer Son    Colon cancer Son    Healthy Son    Hypertension Son    Healthy Son    Hypertension Son    Healthy Son    Stomach cancer Neg Hx    Rectal cancer Neg Hx    Esophageal cancer Neg Hx     Review of Systems: A 12-system review of systems was performed and was negative except as noted in the HPI.  --------------------------------------------------------------------------------------------------  Physical Exam: BP (!) 150/100 (BP Location: Left Arm, Patient Position: Sitting, Cuff Size: Large) Comment: took meds 30 min ago  Pulse 87   Ht 5' 1.5" (1.562 m)   Wt 208 lb (94.3 kg)   SpO2 98%   BMI 38.66 kg/m  Repeat BP: 144/80  General:  NAD. Neck: No JVD or HJR, though body habitus limits evaluation. Lungs: Mildly diminished breath sounds throughout without wheezes or crackles. Heart: Regular rate and rhythm without murmurs, rubs,  or gallops. Abdomen: Soft, nontender, nondistended. Extremities: No lower extremity edema.  EKG: Normal sinus rhythm with borderline LVH and poor R wave progression.  No significant change from prior tracing on 04/09/2022.  Lab Results  Component Value Date   WBC 4.5 05/14/2021   HGB 14.3 05/14/2021   HCT 43.6 05/14/2021   MCV 99 (H) 05/14/2021   PLT 196 05/14/2021    Lab Results  Component Value Date   NA 137 08/26/2022   K 3.7 08/26/2022   CL 99 08/26/2022   CO2 29 08/26/2022   BUN 10 08/26/2022   CREATININE 0.83 08/26/2022   GLUCOSE 110 (H) 08/26/2022   ALT 17 08/26/2022    Lab Results  Component Value Date   CHOL 161 08/26/2022   HDL 70.30 08/26/2022   LDLCALC 72 08/26/2022   LDLDIRECT 59 07/13/2020   TRIG 93.0 08/26/2022   CHOLHDL 2 08/26/2022    --------------------------------------------------------------------------------------------------  ASSESSMENT AND PLAN: Coronary artery disease with atypical angina: Diane [name] reports some episodic dyspnea and wheezing as well as chest and arm pain that is not consistent with angina that she experienced prior to her PCI in 2021.  Her EKG is unchanged from priors.  I suspect that some of her symptoms may be related to her underlying COPD.  However, I have recommended that we obtain a pharmacologic myocardial perfusion stress test for further evaluation.  In the meantime, we will continue antianginal therapy with diltiazem and ranolazine as well as secondary prevention with atorvastatin, ezetimibe, and clopidogrel.  COPD: Recent episodic shortness of breath and wheezing with coughing reported.  I encouraged Diane [name] to continue using her rescue inhaler and to reach out to her PCP and/or pulmonologist for further evaluation if her symptoms worsen.  Of note, she does not have any wheezing on examination today.  Hyperlipidemia: Lipids just above goal on last check in 07/2022.  Continue atorvastatin 80 mg daily and ezetimibe  10 mg daily as well as ongoing lifestyle modifications.  Hypertension: Blood pressure mildly elevated today.  In the setting of her acute symptoms, we have agreed to defer medication changes at this time.  Ongoing lifestyle modifications including sodium restriction were reinforced.  Shared Decision  Making/Informed Consent The risks [chest pain, shortness of breath, cardiac arrhythmias, dizziness, blood pressure fluctuations, myocardial infarction, stroke/transient ischemic attack, nausea, vomiting, allergic reaction, radiation exposure, metallic taste sensation and life-threatening complications (estimated to be 1 in 10,000)], benefits (risk stratification, diagnosing coronary artery disease, treatment guidance) and alternatives of a nuclear stress test were discussed in detail with Diane [name] and she agrees to proceed.  Follow-up: Return to clinic in 6 weeks.  Nelva Bush, MD 10/09/2022 9:20 AM

## 2022-10-09 NOTE — Patient Instructions (Signed)
Medication Instructions:   Your physician recommends that you continue on your current medications as directed. Please refer to the Current Medication list given to you today.  *If you need a refill on your cardiac medications before your next appointment, please call your pharmacy*   Lab Work:  None Ordered  If you have labs (blood work) drawn today and your tests are completely normal, you will receive your results only by: [place] (if you have MyChart) OR A paper copy in the mail If you have any lab test that is abnormal or we need to change your treatment, we will call you to review the results.   Testing/Procedures:  MYOVIEW  Your caregiver has ordered a Stress Test with nuclear imaging. The purpose of this test is to evaluate the blood supply to your heart muscle. This procedure is referred to as a "Non-Invasive Stress Test." This is because other than having an IV started in your vein, nothing is inserted or "invades" your body. Cardiac stress tests are done to find areas of poor blood flow to the heart by determining the extent of coronary artery disease (CAD). Some patients exercise on a treadmill, which naturally increases the blood flow to your heart, while others who are  unable to walk on a treadmill due to physical limitations have a pharmacologic/chemical stress agent called Lexiscan . This medicine will mimic walking on a treadmill by temporarily increasing your coronary blood flow.   Please note: these test may take anywhere between 2-4 hours to complete    Date of Procedure:_____________________________________  Arrival Time for Procedure:______________________________   PLEASE NOTIFY THE OFFICE AT LEAST 24 HOURS IN ADVANCE IF YOU ARE UNABLE TO KEEP YOUR APPOINTMENT.  [phone]   - How to prepare for your Myoview test:  Do not eat or drink after midnight No caffeine for 24 hours prior to test No smoking 24 hours prior to test. Your medication may be  taken with water.  If your doctor stopped a medication because of this test, do not take that medication. Ladies, please do not wear dresses.  Skirts or pants are appropriate. Please wear a short sleeve shirt. No perfume, cologne or lotion. Wear comfortable walking shoes. No heels!   Follow-Up: At [facility], you and your health needs are our priority.  As part of our continuing mission to provide you with exceptional heart care, we have created designated Provider Care Teams.  These Care Teams include your primary Cardiologist (physician) and Advanced Practice Providers (APPs -  Physician Assistants and Nurse Practitioners) who all work together to provide you with the care you need, when you need it.  We recommend signing up for the patient portal called "MyChart".  Sign up information is provided on this After Visit Summary.  MyChart is used to connect with patients for Virtual Visits (Telemedicine).  Patients are able to view lab/test results, encounter notes, upcoming appointments, etc.  Non-urgent messages can be sent to your provider as well.   To learn more about what you can do with MyChart, go to [email or web address].    Your next appointment:   6 week(s)  The format for your next appointment:   In Person  Provider:   You may see Nelva Bush, MD or one of the following Advanced Practice Providers on your designated Care Team:   Murray Hodgkins, NP Christell Faith, PA-C Cadence Kathlen Mody, PA-C Gerrie Nordmann, NP

## 2022-10-10 ENCOUNTER — Encounter: Payer: Self-pay | Admitting: Internal Medicine

## 2022-10-10 DIAGNOSIS — J449 Chronic obstructive pulmonary disease, unspecified: Secondary | ICD-10-CM | POA: Insufficient documentation

## 2022-10-13 ENCOUNTER — Ambulatory Visit (HOSPITAL_COMMUNITY): Payer: Medicare HMO | Attending: Internal Medicine

## 2022-10-13 DIAGNOSIS — I25118 Atherosclerotic heart disease of native coronary artery with other forms of angina pectoris: Secondary | ICD-10-CM | POA: Insufficient documentation

## 2022-10-13 LAB — MYOCARDIAL PERFUSION IMAGING
LV dias vol: 40 mL (ref 46–106)
LV sys vol: 12 mL
Nuc Stress EF: 69 %
Peak HR: 100 {beats}/min
Rest HR: 86 {beats}/min
Rest Nuclear Isotope Dose: 10.4 mCi
SDS: 3
SRS: 3
SSS: 6
ST Depression (mm): 0 mm
Stress Nuclear Isotope Dose: 30.4 mCi
TID: 1.31

## 2022-10-13 MED ORDER — REGADENOSON 0.4 MG/5ML IV SOLN
0.4000 mg | Freq: Once | INTRAVENOUS | Status: AC
  Administered 2022-10-13: 0.4 mg via INTRAVENOUS

## 2022-10-13 MED ORDER — TECHNETIUM TC 99M TETROFOSMIN IV KIT
10.4000 | PACK | Freq: Once | INTRAVENOUS | Status: AC | PRN
  Administered 2022-10-13: 10.4 via INTRAVENOUS

## 2022-10-13 MED ORDER — TECHNETIUM TC 99M TETROFOSMIN IV KIT
30.4000 | PACK | Freq: Once | INTRAVENOUS | Status: AC | PRN
  Administered 2022-10-13: 30.4 via INTRAVENOUS

## 2022-10-22 ENCOUNTER — Other Ambulatory Visit: Payer: Self-pay | Admitting: Internal Medicine

## 2022-10-22 ENCOUNTER — Other Ambulatory Visit: Payer: Self-pay | Admitting: Nurse Practitioner

## 2022-10-22 DIAGNOSIS — R519 Headache, unspecified: Secondary | ICD-10-CM

## 2022-10-22 DIAGNOSIS — I10 Essential (primary) hypertension: Secondary | ICD-10-CM

## 2022-10-22 NOTE — Telephone Encounter (Signed)
Chart supports Rx Last OV: 08/2022 Next OV: 12/2022

## 2022-11-18 ENCOUNTER — Other Ambulatory Visit: Payer: Self-pay | Admitting: Gastroenterology

## 2022-11-18 DIAGNOSIS — K219 Gastro-esophageal reflux disease without esophagitis: Secondary | ICD-10-CM

## 2022-11-18 DIAGNOSIS — K2960 Other gastritis without bleeding: Secondary | ICD-10-CM

## 2022-11-26 NOTE — Progress Notes (Deleted)
Follow-up Outpatient Visit Date: 11/27/2022  Primary Care Provider: Flossie Buffy, NP [place] [address]  Chief Complaint: ***  HPI:  Diane [name] is a 71 y.o. female with history of CAD status post PCI to proximal RCA (02/6643), diastolic dysfunction, HTN, HLD, stroke, and GERD, who presents for follow-up of coronary artery disease and HFpEF.  I last saw her in mid October, at which time she complained of mild dyspnea and wheezing.  She also complained of occasional achiness below the left clavicle and heaviness in both arms, not reminiscent of her anginal pain leading up to PCI in 2021.  We agreed to obtain a pharmacologic myocardial perfusion stress test, which was low risk without evidence of ischemia or scar.  --------------------------------------------------------------------------------------------------  Past Medical History:  Diagnosis Date   Arthritis    CAD (coronary artery disease)    a. 09/2013 Cath: LM nl, LAD 30p, 65m LCX nl, RCA 30p, EF 55-60%; b. 04/2017 MV: EF>65%, apical defect ->breast attenuation. Sm. area of isch cannot be excluded; c. 04/2020 Cor CTA: Ca2+ = 704 (96%). Sev RCA/LAD dzs; d. 05/2020 Cath/PCI: LM nl, LAD 329m4570m LCX 20p, RCA 80p (DFR 0.88-->2.5x12 Synergy XD DES).   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 01/2013 Echo: EF 55-60%, no rwma, Gr1 DD, PASP 69m33m b. 04/2017 Echo: EF 65-70%, no rwma, Gr1 DD.   GERD (gastroesophageal reflux disease)    Hyperlipidemia    Hypertension    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/2015   basal cell   [facility]   deaf in right ear   Tubular adenoma of colon 2018   Past Surgical History:  Procedure Laterality Date   BREAST BIOPSY Left 1984   abcess   [place] 01/[place]2015   Procedure: CHONDROPLASTY;  Surgeon: W D Yvette RackD;  [place];  Laterality: Left;   COLONOSCOPY  05/17/2021   CORONARY STENT INTERVENTION N/A 06/01/2020   Procedure: CORONARY STENT INTERVENTION;  Surgeon: Namiyah Grantham,Nelva Bush;  [facility];  Service: Cardiovascular;  Laterality: N/A;   INTRAVASCULAR PRESSURE WIRE/FFR STUDY N/A 06/01/2020   Procedure: INTRAVASCULAR PRESSURE WIRE/FFR STUDY;  Surgeon: Eureka Valdes,Nelva Bush;  [facility] [facility] [place];  Service: Cardiovascular;  Laterality: N/A;   KNEE ARTHROSCOPY WITH MEDIAL MENISECTOMY Left 01/05/2015   Procedure: LEFT KNEE ARTHROSCOPY WITH MEDIAL AND LATERAL MENISECTOMY; DEBRIDEMENT LATERAL PATELLA FEMORAL ;  Surgeon: W D Yvette RackD;  [place];  Laterality: Left;   LEFT HEART CATH AND CORONARY ANGIOGRAPHY N/A 06/01/2020   Procedure: LEFT HEART CATH AND CORONARY ANGIOGRAPHY;  Surgeon: Yamilet Mcfayden,Nelva Bush;  [facility] [place];  Service: Cardiovascular;  Laterality: N/A;   LEFT HEART CATHETERIZATION WITH CORONARY ANGIOGRAM N/A 10/28/2013   Procedure: LEFT HEART CATHETERIZATION WITH CORONARY ANGIOGRAM;  Surgeon: DaltLarey Dresser;  [facility];  Service: Cardiovascular;  Laterality: N/A;   TOTAL KNEE ARTHROPLASTY Left 07/06/2015   Procedure: TOTAL KNEE ARTHROPLASTY;  Surgeon: DaniEarlie Server;  [facility] [facility] [place];  Laterality: Left;   TUBAL LIGATION  UPPER GASTROINTESTINAL ENDOSCOPY  05/17/2021    No outpatient medications have been marked as taking for the 11/27/22 encounter (Appointment) with Makella Buckingham, Harrell Gave, MD.    Allergies: Boniva [ibandronic acid], Fosamax [alendronate sodium], and Prolia [denosumab]  Social History   Tobacco Use   Smoking status: Never   Smokeless tobacco: Never   Tobacco comments:    LIVES WITH 2 SMOKERS   Vaping Use    Vaping Use: Never used  Substance Use Topics   Alcohol use: Not Currently    Comment: 1 glass of wine per month   Drug use: No    Family History  Problem Relation Age of Onset   Hypertension Mother    Stroke Mother    Breast cancer Mother    Stroke Father    Heart failure Father    Heart disease Father        CABG   Stroke Maternal Grandmother    Heart failure Maternal Grandmother    Heart failure Maternal Grandfather    Ovarian cancer Paternal Grandmother    Liver cancer Paternal Programmer, applications Daughter    Healthy Daughter    Healthy Daughter    Healthy Daughter    Juvenile Diabetes Son    Healthy Son    Testicular cancer Son    Liver cancer Son    Colon cancer Son    Healthy Son    Hypertension Son    Healthy Son    Hypertension Son    Healthy Son    Stomach cancer Neg Hx    Rectal cancer Neg Hx    Esophageal cancer Neg Hx     Review of Systems: A 12-system review of systems was performed and was negative except as noted in the HPI.  --------------------------------------------------------------------------------------------------  Physical Exam: There were no vitals taken for this visit.  General:  NAD. Neck: No JVD or HJR. Lungs: Clear to auscultation bilaterally without wheezes or crackles. Heart: Regular rate and rhythm without murmurs, rubs, or gallops. Abdomen: Soft, nontender, nondistended. Extremities: No lower extremity edema.  EKG:  ***  Lab Results  Component Value Date   WBC 4.5 05/14/2021   HGB 14.3 05/14/2021   HCT 43.6 05/14/2021   MCV 99 (H) 05/14/2021   PLT 196 05/14/2021    Lab Results  Component Value Date   NA 137 08/26/2022   K 3.7 08/26/2022   CL 99 08/26/2022   CO2 29 08/26/2022   BUN 10 08/26/2022   CREATININE 0.83 08/26/2022   GLUCOSE 110 (H) 08/26/2022   ALT 17 08/26/2022    Lab Results  Component Value Date   CHOL 161 08/26/2022   HDL 70.30 08/26/2022   LDLCALC 72 08/26/2022   LDLDIRECT 59  07/13/2020   TRIG 93.0 08/26/2022   CHOLHDL 2 08/26/2022    --------------------------------------------------------------------------------------------------  ASSESSMENT AND PLAN: Harrell Gave Tashyra Adduci, MD 11/26/2022 8:34 AM

## 2022-11-27 ENCOUNTER — Encounter: Payer: Self-pay | Admitting: Internal Medicine

## 2022-11-27 ENCOUNTER — Ambulatory Visit: Payer: Medicare HMO | Attending: Internal Medicine | Admitting: Internal Medicine

## 2022-12-01 ENCOUNTER — Other Ambulatory Visit: Payer: Self-pay | Admitting: Internal Medicine

## 2022-12-08 ENCOUNTER — Other Ambulatory Visit: Payer: Self-pay | Admitting: Internal Medicine

## 2022-12-08 DIAGNOSIS — I25119 Atherosclerotic heart disease of native coronary artery with unspecified angina pectoris: Secondary | ICD-10-CM

## 2022-12-31 ENCOUNTER — Telehealth: Payer: Self-pay | Admitting: Nurse Practitioner

## 2022-12-31 ENCOUNTER — Other Ambulatory Visit: Payer: Self-pay | Admitting: Nurse Practitioner

## 2022-12-31 ENCOUNTER — Ambulatory Visit: Payer: Medicare HMO | Admitting: Nurse Practitioner

## 2022-12-31 DIAGNOSIS — F411 Generalized anxiety disorder: Secondary | ICD-10-CM

## 2022-12-31 DIAGNOSIS — F339 Major depressive disorder, recurrent, unspecified: Secondary | ICD-10-CM

## 2022-12-31 NOTE — Telephone Encounter (Signed)
Pt was a no show 1/03 for OV with [facility]. She has several

## 2023-01-02 ENCOUNTER — Encounter: Payer: Self-pay | Admitting: Nurse Practitioner

## 2023-01-02 NOTE — Telephone Encounter (Signed)
Pt has several missed appointments. 09/27/2021 no show 10/07/2021 no show 12/11/2021 same day cancellation 08/07/2022 same day cancel, car trouble 12/31/2022 no show  Pt has rescheduled for 01/13/2023.  Please advise how you would like to proceed. Final warning? Discuss final warning at next OV? Proceed with dismissal?

## 2023-01-02 NOTE — Telephone Encounter (Signed)
Dismissal generated

## 2023-01-07 ENCOUNTER — Other Ambulatory Visit: Payer: Self-pay | Admitting: Nurse Practitioner

## 2023-01-07 ENCOUNTER — Other Ambulatory Visit: Payer: Self-pay | Admitting: Internal Medicine

## 2023-01-07 DIAGNOSIS — I25119 Atherosclerotic heart disease of native coronary artery with unspecified angina pectoris: Secondary | ICD-10-CM

## 2023-01-07 DIAGNOSIS — F411 Generalized anxiety disorder: Secondary | ICD-10-CM

## 2023-01-07 DIAGNOSIS — R519 Headache, unspecified: Secondary | ICD-10-CM

## 2023-01-08 ENCOUNTER — Other Ambulatory Visit: Payer: Self-pay | Admitting: Internal Medicine

## 2023-01-08 ENCOUNTER — Other Ambulatory Visit: Payer: Self-pay | Admitting: Nurse Practitioner

## 2023-01-08 DIAGNOSIS — I10 Essential (primary) hypertension: Secondary | ICD-10-CM

## 2023-01-08 DIAGNOSIS — G8929 Other chronic pain: Secondary | ICD-10-CM

## 2023-01-08 NOTE — Telephone Encounter (Signed)
30 day supply w 0 refills. sent due to Dismissal

## 2023-01-13 ENCOUNTER — Ambulatory Visit (INDEPENDENT_AMBULATORY_CARE_PROVIDER_SITE_OTHER): Payer: Medicare HMO | Admitting: Nurse Practitioner

## 2023-01-13 ENCOUNTER — Encounter: Payer: Self-pay | Admitting: Nurse Practitioner

## 2023-01-13 VITALS — BP 130/84 | HR 86 | Temp 96.8°F | Ht 61.5 in | Wt 210.0 lb

## 2023-01-13 DIAGNOSIS — G4700 Insomnia, unspecified: Secondary | ICD-10-CM | POA: Diagnosis not present

## 2023-01-13 DIAGNOSIS — G8929 Other chronic pain: Secondary | ICD-10-CM

## 2023-01-13 DIAGNOSIS — F411 Generalized anxiety disorder: Secondary | ICD-10-CM

## 2023-01-13 DIAGNOSIS — I25118 Atherosclerotic heart disease of native coronary artery with other forms of angina pectoris: Secondary | ICD-10-CM

## 2023-01-13 DIAGNOSIS — E041 Nontoxic single thyroid nodule: Secondary | ICD-10-CM | POA: Diagnosis not present

## 2023-01-13 DIAGNOSIS — R519 Headache, unspecified: Secondary | ICD-10-CM

## 2023-01-13 DIAGNOSIS — J449 Chronic obstructive pulmonary disease, unspecified: Secondary | ICD-10-CM | POA: Diagnosis not present

## 2023-01-13 DIAGNOSIS — F339 Major depressive disorder, recurrent, unspecified: Secondary | ICD-10-CM | POA: Diagnosis not present

## 2023-01-13 DIAGNOSIS — I5032 Chronic diastolic (congestive) heart failure: Secondary | ICD-10-CM | POA: Diagnosis not present

## 2023-01-13 LAB — LIPID PANEL
Cholesterol: 177 mg/dL (ref 0–200)
HDL: 68 mg/dL (ref >39.00)
LDL Cholesterol: 77 mg/dL (ref 0–99)
NonHDL: 109.47
Total CHOL/HDL Ratio: 3
Triglycerides: 163 mg/dL — ABNORMAL HIGH (ref 0.0–149.0)
VLDL: 32.6 mg/dL (ref 0.0–40.0)

## 2023-01-13 LAB — COMPREHENSIVE METABOLIC PANEL
ALT: 16 U/L (ref 0–35)
AST: 15 U/L (ref 0–37)
Albumin: 4.5 g/dL (ref 3.5–5.2)
Alkaline Phosphatase: 57 U/L (ref 39–117)
BUN: 11 mg/dL (ref 6–23)
CO2: 32 mEq/L (ref 19–32)
Calcium: 9.4 mg/dL (ref 8.4–10.5)
Chloride: 101 mEq/L (ref 96–112)
Creatinine, Ser: 0.87 mg/dL (ref 0.40–1.20)
GFR: 67.53 mL/min (ref >60.00)
Glucose, Bld: 102 mg/dL — ABNORMAL HIGH (ref 70–99)
Potassium: 4 mEq/L (ref 3.5–5.1)
Sodium: 140 mEq/L (ref 135–145)
Total Bilirubin: 0.4 mg/dL (ref 0.2–1.2)
Total Protein: 6.8 g/dL (ref 6.0–8.3)

## 2023-01-13 MED ORDER — DULOXETINE HCL 30 MG PO CPEP: 30.0000 mg | ORAL_CAPSULE | Freq: Every day | ORAL | 1 refills | Status: AC

## 2023-01-13 MED ORDER — ALBUTEROL SULFATE HFA 108 (90 BASE) MCG/ACT IN AERS: 1.0000 | INHALATION_SPRAY | Freq: Four times a day (QID) | RESPIRATORY_TRACT | 2 refills | Status: AC | PRN

## 2023-01-13 MED ORDER — AMITRIPTYLINE HCL 25 MG PO TABS: 25.0000 mg | ORAL_TABLET | Freq: Every day | ORAL | 0 refills | Status: AC

## 2023-01-13 MED ORDER — BUDESONIDE-FORMOTEROL FUMARATE 160-4.5 MCG/ACT IN AERO: 1.0000 | INHALATION_SPRAY | Freq: Two times a day (BID) | RESPIRATORY_TRACT | 5 refills | Status: AC

## 2023-01-13 MED ORDER — DIAZEPAM 5 MG PO TABS: 5.0000 mg | ORAL_TABLET | Freq: Two times a day (BID) | ORAL | 2 refills | Status: AC

## 2023-01-13 NOTE — Assessment & Plan Note (Signed)
Thyroid US: thyromegaly with small nodules Repeat thyroid panel today

## 2023-01-13 NOTE — Assessment & Plan Note (Signed)
Stable mood with elavil, cymbalta and diazepam. Med refill sent

## 2023-01-13 NOTE — Progress Notes (Signed)
Established Patient Visit  Patient: Diane [name]   DOB: 02-24-1951   72 y.o. Female  MRN: [id number] Visit Date: 01/13/2023  Subjective:    Chief Complaint  Patient presents with   Office Visit    Anxiety/ depression/ hyperlipidemia  Pt fasting  Trouble sleeping    HPI Chronic obstructive pulmonary disease ([place]) SOB and wheezing. No cough, no PND, no LE edema Worsening with cold weather. Use of symbicort prn only.  Advised to use symbicort BID and albuterol prn Advised to schedule appt with pulmonology. Med refills sent   Thyroid nodule Thyroid US: thyromegaly with small nodules Repeat thyroid panel today  Generalized anxiety disorder Stable mood with elavil, cymbalta and diazepam. Med refill sent  Exercise: walking, chair exercise daily. Wt Readings from Last 3 Encounters:  01/13/23 210 lb (95.3 kg)  10/09/22 208 lb (94.3 kg)  09/26/22 208 lb 3.2 oz (94.4 kg)     Reviewed medical, surgical, and social history today  Medications: Outpatient Medications Prior to Visit  Medication Sig   atorvastatin (LIPITOR) 80 MG tablet TAKE 1 TABLET BY MOUTH EVERY DAY   benazepril-hydrochlorthiazide (LOTENSIN HCT) 20-12.5 MG tablet Take 1 tablet by mouth daily. Additional refill available at office visit only   butalbital-acetaminophen-caffeine (FIORICET) 50-325-40 MG tablet TAKE 1 TABLET BY MOUTH DAILY AS NEEDED FOR HEADACHE OR MIGRAINE.   cholecalciferol (VITAMIN D) 1000 UNITS tablet Take 1,000 Units by mouth daily.   clopidogrel (PLAVIX) 75 MG tablet TAKE 1 TABLET BY MOUTH EVERY DAY   diltiazem (CARDIZEM CD) 180 MG 24 hr capsule TAKE 1 CAPSULE BY MOUTH EVERY DAY   ezetimibe (ZETIA) 10 MG tablet TAKE 1 TABLET BY MOUTH EVERY DAY   nitroGLYCERIN (NITROSTAT) 0.4 MG SL tablet PLACE 1 TABLET (0.4 MG TOTAL) UNDER THE TONGUE EVERY 5 (FIVE) MINUTES AS NEEDED FOR CHEST PAIN. MAXIMUM OF 3 DOSES.   ranolazine (RANEXA) 500 MG 12 hr tablet TAKE 1 TABLET BY MOUTH TWICE A  DAY   Rhubarb (ESTROVEN COMPLETE PO) Take 1 tablet by mouth daily.   valACYclovir (VALTREX) 1000 MG tablet Take 500 mg by mouth 2 (two) times daily as needed.   [DISCONTINUED] albuterol (VENTOLIN HFA) 108 (90 Base) MCG/ACT inhaler Inhale 1-2 puffs into the lungs every 6 (six) hours as needed for wheezing or shortness of breath.   [DISCONTINUED] amitriptyline (ELAVIL) 25 MG tablet TAKE 1 TABLET BY MOUTH EVERYDAY AT BEDTIME   [DISCONTINUED] budesonide-formoterol (SYMBICORT) 80-4.5 MCG/ACT inhaler Inhale 2 puffs into the lungs 2 (two) times daily as needed.   [DISCONTINUED] diazepam (VALIUM) 5 MG tablet Take 1 tablet (5 mg total) by mouth every 12 (twelve) hours. Maintain upcoming appt for additional refills   pantoprazole (PROTONIX) 40 MG tablet Take 1 tablet (40 mg total) by mouth daily. (Patient not taking: Reported on 01/13/2023)   [DISCONTINUED] DULoxetine (CYMBALTA) 30 MG capsule Take 1 capsule (30 mg total) by mouth daily. (Patient not taking: Reported on 01/13/2023)   No facility-administered medications prior to visit.   Reviewed past medical and social history.   ROS per HPI above      Objective:  BP 130/84 (BP Location: Left Arm, Patient Position: Sitting, Cuff Size: Normal)   Pulse 86   Temp (!) 96.8 F (36 C) (Temporal)   Ht 5' 1.5" (1.562 m)   Wt 210 lb (95.3 kg)   SpO2 96%   BMI 39.04 kg/m  Physical Exam Constitutional:      General: She is not in acute distress. Cardiovascular:     Rate and Rhythm: Normal rate and regular rhythm.     Pulses: Normal pulses.     Heart sounds: Normal heart sounds.  Pulmonary:     Effort: Pulmonary effort is normal.     Breath sounds: Normal breath sounds.  Musculoskeletal:     Right lower leg: No edema.     Left lower leg: No edema.  Neurological:     Mental Status: She is alert and oriented to person, place, and time.  Psychiatric:        Mood and Affect: Mood normal.        Behavior: Behavior normal.        Thought  Content: Thought content normal.     No results found for any visits on 01/13/23.    Assessment & Plan:    Problem List Items Addressed This Visit       Cardiovascular and Mediastinum   Chronic diastolic heart failure ([place])   Coronary artery disease involving native coronary artery of native heart with other form of angina pectoris ([place])   Relevant Orders   Lipid panel     Respiratory   Chronic obstructive pulmonary disease (HCC)    SOB and wheezing. No cough, no PND, no LE edema Worsening with cold weather. Use of symbicort prn only.  Advised to use symbicort BID and albuterol prn Advised to schedule appt with pulmonology. Med refills sent       Relevant Medications   budesonide-formoterol (SYMBICORT) 160-4.5 MCG/ACT inhaler   albuterol (VENTOLIN HFA) 108 (90 Base) MCG/ACT inhaler     Endocrine   Thyroid nodule - Primary    Thyroid US: thyromegaly with small nodules Repeat thyroid panel today      Relevant Orders   Thyroid Panel With TSH     Other   Chronic headaches   Relevant Medications   DULoxetine (CYMBALTA) 30 MG capsule   amitriptyline (ELAVIL) 25 MG tablet   Depression, recurrent (HCC)   Relevant Medications   DULoxetine (CYMBALTA) 30 MG capsule   diazepam (VALIUM) 5 MG tablet (Start on 02/01/2023)   amitriptyline (ELAVIL) 25 MG tablet   Generalized anxiety disorder    Stable mood with elavil, cymbalta and diazepam. Med refill sent      Relevant Medications   DULoxetine (CYMBALTA) 30 MG capsule   diazepam (VALIUM) 5 MG tablet (Start on 02/01/2023)   amitriptyline (ELAVIL) 25 MG tablet   Insomnia   Other Visit Diagnoses     Chronic heart failure with preserved ejection fraction (HFpEF) (HCC)   (Chronic)     Relevant Orders   Comprehensive metabolic panel      No follow-ups on file.     Wilfred Lacy, NP

## 2023-01-13 NOTE — Assessment & Plan Note (Signed)
SOB and wheezing. No cough, no PND, no LE edema Worsening with cold weather. Use of symbicort prn only.  Advised to use symbicort BID and albuterol prn Advised to schedule appt with pulmonology. Med refills sent

## 2023-01-13 NOTE — Patient Instructions (Addendum)
Use symbicort inhaler twice a day and rinse mouth after each use. Use albuterol as needed Maintain other medication doses Maintain appt with cardiology Schedule appt with pulmonolgy Go to lab

## 2023-01-14 LAB — THYROID PANEL WITH TSH
Free Thyroxine Index: 1.7 (ref 1.4–3.8)
T3 Uptake: 28 % (ref 22–35)
T4, Total: 6.1 ug/dL (ref 5.1–11.9)
TSH: 1.94 mIU/L (ref 0.40–4.50)

## 2023-01-16 ENCOUNTER — Ambulatory Visit: Payer: Medicare HMO | Admitting: Internal Medicine

## 2023-01-16 NOTE — Progress Notes (Deleted)
Follow-up Outpatient Visit Date: 01/16/2023  Primary Care Provider: Flossie Buffy, NP [place] [address]  Chief Complaint: ***  HPI:  [name] is a 72 y.o. female with history of CAD status post PCI to proximal RCA (99991111), diastolic dysfunction, HTN, HLD, stroke, and GERD, who presents for follow-up of Diane [name] artery disease and HFpEF.  I last saw her in October, at which time she complained of mild shortness of breath and wheezing.  She also complained of some aching in her left upper chest just below the collarbone and heaviness in her arms, though this was different than what she experienced prior to her PCI in 2021.  It was felt that her symptoms were driven largely by COPD.  However, we also agreed to obtain a pharmacologic MPI to exclude progressive ischemic heart disease.  The study was low risk without evidence of ischemia or scar.  --------------------------------------------------------------------------------------------------  Past Medical History:  Diagnosis Date   Arthritis    CAD (coronary artery disease)    a. 09/2013 Cath: LM nl, LAD 30p, 12m LCX nl, RCA 30p, EF 55-60%; b. 04/2017 MV: EF>65%, apical defect ->breast attenuation. Sm. area of isch cannot be excluded; c. 04/2020 Cor CTA: Ca2+ = 704 (96%). Sev RCA/LAD dzs; d. 05/2020 Cath/PCI: LM nl, LAD 334m4563m LCX 20p, RCA 80p (DFR 0.88-->2.5x12 Synergy XD DES).   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 01/2013 Echo: EF 55-60%, no rwma, Gr1 DD, PASP 89m48m b. 04/2017 Echo: EF 65-70%, no rwma, Gr1 DD.   GERD (gastroesophageal reflux disease)    Hyperlipidemia    Hypertension    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/2015   basal cell   [facility]   deaf in right ear   Tubular adenoma of colon 2018   Past Surgical History:  Procedure  Laterality Date   BREAST BIOPSY Left 1984   abcess   CARD[place] AF[place]ONDROPLASTY Left 01/05/2015   Procedure: CHONDROPLASTY;  Surgeon: W D Yvette RackD;  [place];  Laterality: Left;   COLONOSCOPY  05/17/2021   CORONARY STENT INTERVENTION N/A 06/01/2020   Procedure: CORONARY STENT INTERVENTION;  Surgeon: Doc Mandala,Nelva Bush;  [facility];  Service: Cardiovascular;  Laterality: N/A;   INTRAVASCULAR PRESSURE WIRE/FFR STUDY N/A 06/01/2020   Procedure: INTRAVASCULAR PRESSURE WIRE/FFR STUDY;  Surgeon: Kestrel Mis,Nelva Bush;  [facility];  Service: Cardiovascular;  Laterality: N/A;   KNEE ARTHROSCOPY WITH MEDIAL MENISECTOMY Left 01/05/2015   Procedure: LEFT KNEE ARTHROSCOPY WITH MEDIAL AND LATERAL MENISECTOMY; DEBRIDEMENT LATERAL PATELLA FEMORAL ;  Surgeon: W D Yvette RackD;  [place] [place];  Laterality: Left;   LEFT HEART CATH AND CORONARY ANGIOGRAPHY N/A 06/01/2020   Procedure: LEFT HEART CATH AND CORONARY ANGIOGRAPHY;  Surgeon: Dalores Weger,Nelva Bush;  [facility];  Service: Cardiovascular;  Laterality: N/A;   LEFT HEART CATHETERIZATION WITH CORONARY ANGIOGRAM N/A 10/28/2013   Procedure: LEFT HEART CATHETERIZATION WITH CORONARY ANGIOGRAM;  Surgeon: DaltLarey Dresser;  [facility];  Service: Cardiovascular;  Laterality: N/A;   TOTAL KNEE ARTHROPLASTY Left 07/06/2015  Procedure: TOTAL KNEE ARTHROPLASTY;  Surgeon: Earlie Server, MD;  Location: [place];  Service: Orthopedics;  Laterality: Left;   TUBAL LIGATION     UPPER GASTROINTESTINAL ENDOSCOPY  05/17/2021    No outpatient medications have been marked as taking for the 01/16/23 encounter (Appointment) with Diane [name], Diane Gave, MD.    Allergies: Boniva [ibandronic acid], Fosamax [alendronate sodium], and Prolia [denosumab]  Social History    Tobacco Use   Smoking status: Never   Smokeless tobacco: Never   Tobacco comments:    LIVES WITH 2 SMOKERS   Vaping Use   Vaping Use: Never used  Substance Use Topics   Alcohol use: Not Currently    Comment: 1 glass of wine per month   Drug use: No    Family History  Problem Relation Age of Onset   Hypertension Mother    Stroke Mother    Breast cancer Mother    Stroke Father    Heart failure Father    Heart disease Father        CABG   Stroke Maternal Grandmother    Heart failure Maternal Grandmother    Heart failure Maternal Grandfather    Ovarian cancer Paternal Grandmother    Liver cancer Paternal Programmer, applications Daughter    Healthy Daughter    Healthy Daughter    Healthy Daughter    Juvenile Diabetes Son    Healthy Son    Testicular cancer Son    Liver cancer Son    Colon cancer Son    Healthy Son    Hypertension Son    Healthy Son    Hypertension Son    Healthy Son    Stomach cancer Neg Hx    Rectal cancer Neg Hx    Esophageal cancer Neg Hx     Review of Systems: A 12-system review of systems was performed and was negative except as noted in the HPI.  --------------------------------------------------------------------------------------------------  Physical Exam: There were no vitals taken for this visit.  General:  NAD. Neck: No JVD or HJR. Lungs: Clear to auscultation bilaterally without wheezes or crackles. Heart: Regular rate and rhythm without murmurs, rubs, or gallops. Abdomen: Soft, nontender, nondistended. Extremities: No lower extremity edema.  EKG:  ***  Lab Results  Component Value Date   WBC 4.5 05/14/2021   HGB 14.3 05/14/2021   HCT 43.6 05/14/2021   MCV 99 (H) 05/14/2021   PLT 196 05/14/2021    Lab Results  Component Value Date   NA 140 01/13/2023   K 4.0 01/13/2023   CL 101 01/13/2023   CO2 32 01/13/2023   BUN 11 01/13/2023   CREATININE 0.87 01/13/2023   GLUCOSE 102 (H) 01/13/2023   ALT 16 01/13/2023     Lab Results  Component Value Date   CHOL 177 01/13/2023   HDL 68.00 01/13/2023   LDLCALC 77 01/13/2023   LDLDIRECT 59 07/13/2020   TRIG 163.0 (H) 01/13/2023   CHOLHDL 3 01/13/2023    --------------------------------------------------------------------------------------------------  ASSESSMENT AND PLAN: Diane [name] Hudsyn Barich, MD 01/16/2023 7:52 AM

## 2023-01-20 ENCOUNTER — Other Ambulatory Visit: Payer: Self-pay | Admitting: *Deleted

## 2023-01-20 DIAGNOSIS — I10 Essential (primary) hypertension: Secondary | ICD-10-CM

## 2023-01-20 MED ORDER — BENAZEPRIL-HYDROCHLOROTHIAZIDE 20-12.5 MG PO TABS: 1.0000 | ORAL_TABLET | Freq: Every day | ORAL | 3 refills | Status: AC

## 2023-01-25 ENCOUNTER — Other Ambulatory Visit: Payer: Self-pay | Admitting: Internal Medicine

## 2023-01-25 DIAGNOSIS — E785 Hyperlipidemia, unspecified: Secondary | ICD-10-CM

## 2023-02-11 ENCOUNTER — Telehealth: Payer: Self-pay | Admitting: Nurse Practitioner

## 2023-02-11 NOTE — Telephone Encounter (Signed)
Made patient aware of Charlotte's instructions.

## 2023-02-11 NOTE — Telephone Encounter (Signed)
Pt wants to know if Baldo Ash can refill her Diazepam and deloxatine until she goes to her new provider on the 29th

## 2023-02-19 NOTE — Progress Notes (Deleted)
Follow-up Outpatient Visit Date: 02/20/2023  Primary Care Provider: Flossie Buffy, NP [place] [id number]  Chief Complaint: ***  HPI:  [name] is a 72 y.o. female with history of CAD status post PCI to proximal RCA (99991111), diastolic dysfunction, HTN, HLD, stroke, and GERD, who presents for follow-up of coronary artery disease and HFpEF.  I last saw her in October, at which time she felt slightly short of breath and wheezy.  She also complained of some vague achiness in the left upper chest just below the clavicle accompanied by bilateral arm heaviness.  Pain was not reminiscent of what she felt prior to her PCI in 2021.  Subsequent myocardial perfusion stress test was low risk without ischemia or scar.  --------------------------------------------------------------------------------------------------  Past Medical History:  Diagnosis Date   Arthritis    CAD (coronary artery disease)    a. 09/2013 Cath: LM nl, LAD 30p, 1m LCX nl, RCA 30p, EF 55-60%; b. 04/2017 MV: EF>65%, apical defect ->breast attenuation. Sm. area of isch cannot be excluded; c. 04/2020 Cor CTA: Ca2+ = 704 (96%). Sev RCA/LAD dzs; d. 05/2020 Cath/PCI: LM nl, LAD 351m4518m LCX 20p, RCA 80p (DFR 0.88-->2.5x12 Synergy XD DES).   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 01/2013 Echo: EF 55-60%, no rwma, Gr1 DD, PASP 29m52m b. 04/2017 Echo: EF 65-70%, no rwma, Gr1 DD.   GERD (gastroesophageal reflux disease)    Hyperlipidemia    Hypertension    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/2015   basal cell   [facility]   deaf in right ear   Tubular adenoma of colon 2018   Past Surgical History:  Procedure Laterality Date   BREAST BIOPSY Left 1984   abcess   CAR[place]ONDROPLASTY Left 01/05/2015   Procedure: CHONDROPLASTY;  Surgeon: W D Yvette RackD;  [facility];  Laterality: Left;   COLONOSCOPY  05/17/2021   CORONARY STENT INTERVENTION N/A 06/01/2020   Procedure: CORONARY STENT INTERVENTION;  Surgeon: Nickole Adamek,Nelva Bush;  [facility];  Service: Cardiovascular;  Laterality: N/A;   INTRAVASCULAR PRESSURE WIRE/FFR STUDY N/A 06/01/2020   Procedure: INTRAVASCULAR PRESSURE WIRE/FFR STUDY;  Surgeon: Abdifatah Colquhoun,Nelva Bush;  [facility];  Service: Cardiovascular;  Laterality: N/A;   KNEE ARTHROSCOPY WITH MEDIAL MENISECTOMY Left 01/05/2015   Procedure: LEFT KNEE ARTHROSCOPY WITH MEDIAL AND LATERAL MENISECTOMY; DEBRIDEMENT LATERAL PATELLA FEMORAL ;  Surgeon: W D Yvette RackD;  [facility]opedics;  Laterality: Left;   LEFT HEART CATH AND CORONARY ANGIOGRAPHY N/A 06/01/2020   Procedure: LEFT HEART CATH AND CORONARY ANGIOGRAPHY;  Surgeon: Nekeya Briski,Nelva Bush;  [facility];  Service: Cardiovascular;  Laterality: N/A;   LEFT HEART CATHETERIZATION WITH CORONARY ANGIOGRAM N/A 10/28/2013   Procedure: LEFT HEART CATHETERIZATION WITH CORONARY ANGIOGRAM;  Surgeon: DaltLarey Dresser;  [facility];  Service: Cardiovascular;  Laterality: N/A;   TOTAL KNEE ARTHROPLASTY Left 07/06/2015   Procedure: TOTAL KNEE ARTHROPLASTY;  Surgeon: DaniEarlie Server;  [facility];  Laterality: Left;   TUBAL LIGATION  UPPER GASTROINTESTINAL ENDOSCOPY  05/17/2021    No outpatient medications have been marked as taking for the 02/20/23 encounter (Appointment) with Kratos Ruscitti, Harrell Gave, MD.    Allergies: Boniva [ibandronic acid], Fosamax [alendronate sodium], and Prolia [denosumab]  Social History   Tobacco Use   Smoking status: Never   Smokeless tobacco: Never   Tobacco comments:    LIVES WITH 2 SMOKERS   Vaping Use    Vaping Use: Never used  Substance Use Topics   Alcohol use: Not Currently    Comment: 1 glass of wine per month   Drug use: No    Family History  Problem Relation Age of Onset   Hypertension Mother    Stroke Mother    Breast cancer Mother    Stroke Father    Heart failure Father    Heart disease Father        CABG   Stroke Maternal Grandmother    Heart failure Maternal Grandmother    Heart failure Maternal Grandfather    Ovarian cancer Paternal Grandmother    Liver cancer Paternal Programmer, applications Daughter    Healthy Daughter    Healthy Daughter    Healthy Daughter    Juvenile Diabetes Son    Healthy Son    Testicular cancer Son    Liver cancer Son    Colon cancer Son    Healthy Son    Hypertension Son    Healthy Son    Hypertension Son    Healthy Son    Stomach cancer Neg Hx    Rectal cancer Neg Hx    Esophageal cancer Neg Hx     Review of Systems: A 12-system review of systems was performed and was negative except as noted in the HPI.  --------------------------------------------------------------------------------------------------  Physical Exam: There were no vitals taken for this visit.  General:  NAD. Neck: No JVD or HJR. Lungs: Clear to auscultation bilaterally without wheezes or crackles. Heart: Regular rate and rhythm without murmurs, rubs, or gallops. Abdomen: Soft, nontender, nondistended. Extremities: No lower extremity edema.  EKG:  ***  Lab Results  Component Value Date   WBC 4.5 05/14/2021   HGB 14.3 05/14/2021   HCT 43.6 05/14/2021   MCV 99 (H) 05/14/2021   PLT 196 05/14/2021    Lab Results  Component Value Date   NA 140 01/13/2023   K 4.0 01/13/2023   CL 101 01/13/2023   CO2 32 01/13/2023   BUN 11 01/13/2023   CREATININE 0.87 01/13/2023   GLUCOSE 102 (H) 01/13/2023   ALT 16 01/13/2023    Lab Results  Component Value Date   CHOL 177 01/13/2023   HDL 68.00 01/13/2023   LDLCALC 77 01/13/2023   LDLDIRECT 59  07/13/2020   TRIG 163.0 (H) 01/13/2023   CHOLHDL 3 01/13/2023    --------------------------------------------------------------------------------------------------  ASSESSMENT AND PLAN: Nelva Bush, MD 02/19/2023 2:23 PM

## 2023-02-20 ENCOUNTER — Encounter: Payer: Self-pay | Admitting: Internal Medicine

## 2023-02-20 ENCOUNTER — Ambulatory Visit: Payer: Medicare HMO | Attending: Internal Medicine | Admitting: Internal Medicine

## 2023-02-24 NOTE — Progress Notes (Unsigned)
Follow-up Outpatient Visit Date: 02/25/2023  Primary Care Provider: Flossie Buffy, NP [place] [address]  Chief Complaint: Follow-up coronary artery disease and HFpEF  HPI:  Ms. Giudici is a 72 y.o. female with history of CAD status post PCI to proximal RCA (99991111), diastolic dysfunction, HTN, HLD, stroke, and GERD, who presents for follow-up of coronary artery disease and HFpEF.  I last saw her in October, at which time she felt slightly short of breath and wheezy.  She also complained of some vague achiness in the left upper chest just below the clavicle accompanied by bilateral arm heaviness.  Pain was not reminiscent of what she felt prior to her PCI in 2021.  Subsequent myocardial perfusion stress test was low risk without ischemia or scar.  Today, Ms. Hausler reports feeling fairly well, though she has been under a lot of stress.  She missed her recent appointment with [place] because her son, who has stage IV colon cancer, was hospitalized.  Her mom, dad, and husband have all passed away within the last 1.5 years.  She is trying to be more active and overall is feeling better with this.  She recently began using an inhaler and feels like her shortness of breath and left upper chest tightness has improved since using this.  Home blood pressures are typically well-controlled.  She is tolerating atorvastatin and ezetimibe well.  She denies palpitations, lightheadedness, and edema.  --------------------------------------------------------------------------------------------------  Past Medical History:  Diagnosis Date   Arthritis    CAD (coronary artery disease)    a. 09/2013 Cath: LM nl, LAD 30p, 46m LCX nl, RCA 30p, EF 55-60%; b. 04/2017 MV: EF>65%, apical defect ->breast attenuation. Sm. area of isch cannot be excluded; c. 04/2020 Cor CTA: Ca2+ = 704 (96%). Sev RCA/LAD dzs; d. 05/2020 Cath/PCI: LM nl, LAD 386m4557m LCX 20p, RCA 80p (DFR 0.88-->2.5x12 Synergy XD  DES).   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 01/2013 Echo: EF 55-60%, no rwma, Gr1 DD, PASP 24m36m b. 04/2017 Echo: EF 65-70%, no rwma, Gr1 DD.   GERD (gastroesophageal reflux disease)    Hyperlipidemia    Hypertension    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/2015   basal cell   [facility]   deaf in right ear   Tubular adenoma of colon 2018   Past Surgical History:  Procedure Laterality Date   BREAST BIOPSY Left 1984   abcess   CARD[place]HONDROPLASTY Left 01/05/2015   Procedure: CHONDROPLASTY;  Surgeon: W D Yvette RackD;  [place] [facility];  Laterality: Left;   COLONOSCOPY  05/17/2021   CORONARY STENT INTERVENTION N/A 06/01/2020   Procedure: CORONARY STENT INTERVENTION;  Surgeon: Simone Tuckey,Nelva Bush;  [facility];  Service: Cardiovascular;  Laterality: N/A;   INTRAVASCULAR PRESSURE WIRE/FFR STUDY N/A 06/01/2020   Procedure: INTRAVASCULAR PRESSURE WIRE/FFR STUDY;  Surgeon: Ruthvik Barnaby,Nelva Bush;  [facility];  Service: Cardiovascular;  Laterality: N/A;   KNEE ARTHROSCOPY WITH MEDIAL MENISECTOMY Left 01/05/2015   Procedure: LEFT KNEE ARTHROSCOPY WITH MEDIAL AND LATERAL MENISECTOMY; DEBRIDEMENT LATERAL PATELLA FEMORAL ;  Surgeon: W D Yvette RackD;  Location: [place];  Service: Orthopedics;  Laterality: Left;   LEFT HEART CATH AND CORONARY ANGIOGRAPHY N/A 06/01/2020   Procedure: LEFT HEART CATH AND CORONARY ANGIOGRAPHY;  Surgeon: Nelva Bush, MD;  Location: [place] [facility];  Service: Cardiovascular;  Laterality: N/A;   LEFT HEART CATHETERIZATION WITH CORONARY ANGIOGRAM N/A 10/28/2013   Procedure: LEFT HEART CATHETERIZATION WITH CORONARY ANGIOGRAM;  Surgeon: Larey Dresser,  MD;  Location: [facility];  Service: Cardiovascular;  Laterality: N/A;   TOTAL KNEE ARTHROPLASTY Left 07/06/2015   Procedure: TOTAL KNEE ARTHROPLASTY;  Surgeon: Earlie Server, MD;  Location: [place];  Service: Orthopedics;  Laterality: Left;   TUBAL LIGATION     UPPER GASTROINTESTINAL ENDOSCOPY  05/17/2021    Current Meds  Medication Sig   albuterol (VENTOLIN HFA) 108 (90 Base) MCG/ACT inhaler Inhale 1-2 puffs into the lungs every 6 (six) hours as needed for wheezing or shortness of breath.   amitriptyline (ELAVIL) 25 MG tablet Take 1 tablet (25 mg total) by mouth at bedtime.   atorvastatin (LIPITOR) 80 MG tablet TAKE 1 TABLET BY MOUTH EVERY DAY   benazepril-hydrochlorthiazide (LOTENSIN HCT) 20-12.5 MG tablet Take 1 tablet by mouth daily. Additional refill available at office visit only   budesonide-formoterol (SYMBICORT) 160-4.5 MCG/ACT inhaler Inhale 1 puff into the lungs 2 (two) times daily. Rinse mouth after each use   cholecalciferol (VITAMIN D) 1000 UNITS tablet Take 1,000 Units by mouth daily.   clopidogrel (PLAVIX) 75 MG tablet TAKE 1 TABLET BY MOUTH EVERY DAY   diazepam (VALIUM) 5 MG tablet Take 1 tablet (5 mg total) by mouth every 12 (twelve) hours.   diltiazem (CARDIZEM CD) 180 MG 24 hr capsule TAKE 1 CAPSULE BY MOUTH EVERY DAY   DULoxetine (CYMBALTA) 30 MG capsule Take 1 capsule (30 mg total) by mouth daily.   ezetimibe (ZETIA) 10 MG tablet TAKE 1 TABLET BY MOUTH EVERY DAY   nitroGLYCERIN (NITROSTAT) 0.4 MG SL tablet PLACE 1 TABLET (0.4 MG TOTAL) UNDER THE TONGUE EVERY 5 (FIVE) MINUTES AS NEEDED FOR CHEST PAIN. MAXIMUM OF 3 DOSES.   ranolazine (RANEXA) 500 MG 12 hr tablet TAKE 1 TABLET BY MOUTH TWICE A DAY   Rhubarb (ESTROVEN COMPLETE PO) Take 1 tablet by mouth daily.   valACYclovir (VALTREX) 1000 MG tablet Take 500 mg by mouth 2 (two) times daily as needed.    Allergies: Boniva [ibandronic acid], Fosamax [alendronate sodium], and Prolia [denosumab]  Social History   Tobacco  Use   Smoking status: Never   Smokeless tobacco: Never   Tobacco comments:    LIVES WITH 2 SMOKERS   Vaping Use   Vaping Use: Never used  Substance Use Topics   Alcohol use: Not Currently    Comment: 1 glass of wine per month   Drug use: No    Family History  Problem Relation Age of Onset   Hypertension Mother    Stroke Mother    Breast cancer Mother    Stroke Father    Heart failure Father    Heart disease Father        CABG   Stroke Maternal Grandmother    Heart failure Maternal Grandmother    Heart failure Maternal Grandfather    Ovarian cancer Paternal Grandmother    Liver cancer Paternal Programmer, applications Daughter    Healthy Daughter    Healthy Daughter    Healthy Daughter    Juvenile Diabetes Son    Healthy Son  Testicular cancer Son    Liver cancer Son    Colon cancer Son    Healthy Son    Hypertension Son    Healthy Son    Hypertension Son    Healthy Son    Stomach cancer Neg Hx    Rectal cancer Neg Hx    Esophageal cancer Neg Hx     Review of Systems: A 12-system review of systems was performed and was negative except as noted in the HPI.  --------------------------------------------------------------------------------------------------  Physical Exam: BP 126/80 (BP Location: Left Arm, Patient Position: Sitting, Cuff Size: Large)   Pulse 87   Ht '5\' 1"'$  (1.549 m)   Wt 212 lb (96.2 kg)   SpO2 98%   BMI 40.06 kg/m   General:  NAD. Neck: No JVD or HJR. Lungs: Mildly diminished breath sounds throughout without wheezes or crackles. Heart: Regular rate and rhythm without murmurs, rubs, or gallops. Abdomen: Soft, nontender, nondistended. Extremities: No lower extremity edema.  Lab Results  Component Value Date   WBC 4.5 05/14/2021   HGB 14.3 05/14/2021   HCT 43.6 05/14/2021   MCV 99 (H) 05/14/2021   PLT 196 05/14/2021    Lab Results  Component Value Date   NA 140 01/13/2023   K 4.0 01/13/2023   CL 101 01/13/2023   CO2 32  01/13/2023   BUN 11 01/13/2023   CREATININE 0.87 01/13/2023   GLUCOSE 102 (H) 01/13/2023   ALT 16 01/13/2023    Lab Results  Component Value Date   CHOL 177 01/13/2023   HDL 68.00 01/13/2023   LDLCALC 77 01/13/2023   LDLDIRECT 59 07/13/2020   TRIG 163.0 (H) 01/13/2023   CHOLHDL 3 01/13/2023    --------------------------------------------------------------------------------------------------  ASSESSMENT AND PLAN: Coronary artery disease with stable angina: Chest pain has actually improved with intermittent albuterol use, suggesting noncardiac etiology.  We will continue antianginal therapy with ranolazine and diltiazem as well as aggressive secondary prevention with atorvastatin, ezetimibe, and clopidogrel.  Chronic HFpEF: Ms. Huschka is grossly euvolemic though body habitus limits evaluation.  She reports NYHA class II symptoms.  Defer medication changes today.  Hyperlipidemia: Lipids suboptimal on last check last month with LDL of 77 and triglycerides 163.  Given that she is already on maximum doses of atorvastatin and ezetimibe, we have agreed to defer additional pharmacotherapy in favor of lifestyle modifications, including diet and exercise.  Hypertension: Blood pressure upper normal today.  No medication changes at this time.  Continue to work on lifestyle modifications.  Morbid obesity: BMI greater than 40.  Weight loss encouraged through diet and exercise.  Follow-up: Return to clinic in 6 months.  Nelva Bush, MD 02/25/2023 10:29 AM

## 2023-02-25 ENCOUNTER — Encounter: Payer: Self-pay | Admitting: Internal Medicine

## 2023-02-25 ENCOUNTER — Ambulatory Visit: Payer: Medicare HMO | Attending: Internal Medicine | Admitting: Internal Medicine

## 2023-02-25 VITALS — BP 126/80 | HR 87 | Ht 61.0 in | Wt 212.0 lb

## 2023-02-25 DIAGNOSIS — I10 Essential (primary) hypertension: Secondary | ICD-10-CM | POA: Diagnosis not present

## 2023-02-25 DIAGNOSIS — I25118 Atherosclerotic heart disease of native coronary artery with other forms of angina pectoris: Secondary | ICD-10-CM | POA: Diagnosis not present

## 2023-02-25 DIAGNOSIS — E782 Mixed hyperlipidemia: Secondary | ICD-10-CM | POA: Diagnosis not present

## 2023-02-25 DIAGNOSIS — I5032 Chronic diastolic (congestive) heart failure: Secondary | ICD-10-CM | POA: Diagnosis not present

## 2023-02-25 NOTE — Patient Instructions (Signed)
Medication Instructions:  Your Physician recommend you continue on your current medication as directed.    *If you need a refill on your cardiac medications before your next appointment, please call your pharmacy*   Lab Work: None ordered today   Testing/Procedures: None ordered today   Follow-Up: At [facility], you and your health needs are our priority.  As part of our continuing mission to provide you with exceptional heart care, we have created designated Provider Care Teams.  These Care Teams include your primary Cardiologist (physician) and Advanced Practice Providers (APPs -  Physician Assistants and Nurse Practitioners) who all work together to provide you with the care you need, when you need it.  We recommend signing up for the patient portal called "MyChart".  Sign up information is provided on this After Visit Summary.  MyChart is used to connect with patients for Virtual Visits (Telemedicine).  Patients are able to view lab/test results, encounter notes, upcoming appointments, etc.  Non-urgent messages can be sent to your provider as well.   To learn more about what you can do with MyChart, go to [email or web address].    Your next appointment:   6 month(s)  Provider:   You may see Nelva Bush, MD or one of the following Advanced Practice Providers on your designated Care Team:   Murray Hodgkins, NP Christell Faith, PA-C Cadence Kathlen Mody, PA-C Gerrie Nordmann, NP

## 2023-02-26 ENCOUNTER — Encounter: Payer: Self-pay | Admitting: Internal Medicine

## 2023-03-02 ENCOUNTER — Other Ambulatory Visit: Payer: Self-pay | Admitting: Internal Medicine

## 2023-03-27 DIAGNOSIS — I25118 Atherosclerotic heart disease of native coronary artery with other forms of angina pectoris: Secondary | ICD-10-CM | POA: Diagnosis not present

## 2023-03-27 DIAGNOSIS — E559 Vitamin D deficiency, unspecified: Secondary | ICD-10-CM | POA: Diagnosis not present

## 2023-03-27 DIAGNOSIS — E785 Hyperlipidemia, unspecified: Secondary | ICD-10-CM | POA: Diagnosis not present

## 2023-03-27 DIAGNOSIS — F331 Major depressive disorder, recurrent, moderate: Secondary | ICD-10-CM | POA: Diagnosis not present

## 2023-03-27 DIAGNOSIS — J4531 Mild persistent asthma with (acute) exacerbation: Secondary | ICD-10-CM | POA: Diagnosis not present

## 2023-03-27 DIAGNOSIS — Z6839 Body mass index (BMI) 39.0-39.9, adult: Secondary | ICD-10-CM | POA: Diagnosis not present

## 2023-03-27 DIAGNOSIS — I10 Essential (primary) hypertension: Secondary | ICD-10-CM | POA: Diagnosis not present

## 2023-03-27 DIAGNOSIS — R011 Cardiac murmur, unspecified: Secondary | ICD-10-CM | POA: Diagnosis not present

## 2023-04-08 ENCOUNTER — Other Ambulatory Visit: Payer: Self-pay | Admitting: Internal Medicine

## 2023-04-08 DIAGNOSIS — I25119 Atherosclerotic heart disease of native coronary artery with unspecified angina pectoris: Secondary | ICD-10-CM

## 2023-04-23 DIAGNOSIS — Z0001 Encounter for general adult medical examination with abnormal findings: Secondary | ICD-10-CM | POA: Diagnosis not present

## 2023-04-23 DIAGNOSIS — Z23 Encounter for immunization: Secondary | ICD-10-CM | POA: Diagnosis not present

## 2023-04-23 DIAGNOSIS — Z79899 Other long term (current) drug therapy: Secondary | ICD-10-CM | POA: Diagnosis not present

## 2023-04-23 DIAGNOSIS — I25118 Atherosclerotic heart disease of native coronary artery with other forms of angina pectoris: Secondary | ICD-10-CM | POA: Diagnosis not present

## 2023-04-23 DIAGNOSIS — R0689 Other abnormalities of breathing: Secondary | ICD-10-CM | POA: Diagnosis not present

## 2023-04-23 DIAGNOSIS — Z1211 Encounter for screening for malignant neoplasm of colon: Secondary | ICD-10-CM | POA: Diagnosis not present

## 2023-04-23 DIAGNOSIS — J449 Chronic obstructive pulmonary disease, unspecified: Secondary | ICD-10-CM | POA: Diagnosis not present

## 2023-04-23 DIAGNOSIS — E785 Hyperlipidemia, unspecified: Secondary | ICD-10-CM | POA: Diagnosis not present

## 2023-04-23 DIAGNOSIS — E559 Vitamin D deficiency, unspecified: Secondary | ICD-10-CM | POA: Diagnosis not present

## 2023-04-23 DIAGNOSIS — R011 Cardiac murmur, unspecified: Secondary | ICD-10-CM | POA: Diagnosis not present

## 2023-05-20 ENCOUNTER — Other Ambulatory Visit: Payer: Self-pay | Admitting: Gastroenterology

## 2023-05-20 ENCOUNTER — Other Ambulatory Visit: Payer: Self-pay | Admitting: Nurse Practitioner

## 2023-05-20 ENCOUNTER — Other Ambulatory Visit: Payer: Self-pay | Admitting: Internal Medicine

## 2023-05-20 DIAGNOSIS — K219 Gastro-esophageal reflux disease without esophagitis: Secondary | ICD-10-CM

## 2023-05-20 DIAGNOSIS — K2960 Other gastritis without bleeding: Secondary | ICD-10-CM

## 2023-05-20 DIAGNOSIS — F411 Generalized anxiety disorder: Secondary | ICD-10-CM

## 2023-06-03 ENCOUNTER — Other Ambulatory Visit: Payer: Self-pay | Admitting: Internal Medicine

## 2023-06-29 ENCOUNTER — Other Ambulatory Visit: Payer: Self-pay | Admitting: Nurse Practitioner

## 2023-06-29 DIAGNOSIS — Z1231 Encounter for screening mammogram for malignant neoplasm of breast: Secondary | ICD-10-CM

## 2023-07-06 ENCOUNTER — Other Ambulatory Visit: Payer: Self-pay | Admitting: Internal Medicine

## 2023-07-06 DIAGNOSIS — I25119 Atherosclerotic heart disease of native coronary artery with unspecified angina pectoris: Secondary | ICD-10-CM

## 2023-07-20 ENCOUNTER — Ambulatory Visit
Admission: RE | Admit: 2023-07-20 | Discharge: 2023-07-20 | Disposition: A | Discharge status: Home / Self Care | Payer: Medicare HMO | Source: Ambulatory Visit | Attending: Nurse Practitioner | Admitting: Nurse Practitioner

## 2023-07-20 DIAGNOSIS — Z1231 Encounter for screening mammogram for malignant neoplasm of breast: Secondary | ICD-10-CM

## 2023-08-21 ENCOUNTER — Other Ambulatory Visit: Payer: Self-pay | Admitting: Nurse Practitioner

## 2023-08-21 DIAGNOSIS — F411 Generalized anxiety disorder: Secondary | ICD-10-CM

## 2023-08-21 DIAGNOSIS — F339 Major depressive disorder, recurrent, unspecified: Secondary | ICD-10-CM

## 2023-08-31 ENCOUNTER — Other Ambulatory Visit: Payer: Self-pay | Admitting: Internal Medicine

## 2023-09-01 NOTE — Telephone Encounter (Signed)
Hi,  Could you schedule this patient a 6 month follow up visit? The patient was last seen by Dr. Okey Dupre on 02-25-23. Thank you so much.

## 2023-09-08 ENCOUNTER — Other Ambulatory Visit: Payer: Self-pay | Admitting: Nurse Practitioner

## 2023-09-08 DIAGNOSIS — F411 Generalized anxiety disorder: Secondary | ICD-10-CM

## 2023-09-10 ENCOUNTER — Ambulatory Visit: Payer: Medicare HMO | Attending: Cardiology | Admitting: Cardiology

## 2023-09-10 ENCOUNTER — Encounter: Payer: Self-pay | Admitting: Cardiology

## 2023-09-10 VITALS — BP 110/66 | HR 76 | Ht 61.5 in | Wt 201.4 lb

## 2023-09-10 DIAGNOSIS — R0609 Other forms of dyspnea: Secondary | ICD-10-CM

## 2023-09-10 DIAGNOSIS — I5032 Chronic diastolic (congestive) heart failure: Secondary | ICD-10-CM

## 2023-09-10 DIAGNOSIS — I25118 Atherosclerotic heart disease of native coronary artery with other forms of angina pectoris: Secondary | ICD-10-CM | POA: Diagnosis not present

## 2023-09-10 DIAGNOSIS — E782 Mixed hyperlipidemia: Secondary | ICD-10-CM

## 2023-09-10 DIAGNOSIS — I10 Essential (primary) hypertension: Secondary | ICD-10-CM

## 2023-09-10 MED ORDER — EZETIMIBE 10 MG PO TABS: 10.0000 mg | ORAL_TABLET | Freq: Every day | ORAL | 0 refills | Status: DC

## 2023-09-10 NOTE — Progress Notes (Signed)
Cardiology Office Note:  .   Date:  09/11/2023  ID:  Diane [name], DOB 09-16-1951, MRN [id number] PCP: Diane Cedar, NP  [facility] Providers Cardiologist:  Yvonne Kendall, MD    History of Present Illness: .   Diane [name] is a 72 y.o. female with a past medical history of coronary artery disease status post PCI to the proximal RCA (05/2020), diastolic dysfunction, hypertension, hyperlipidemia, stroke, GERD, and chronic HFpEF, who is here today for follow-up.  Echocardiogram completed in 04/2017 revealed LVEF of 65 to 70%, wall motion was normal without abnormality, G1 DD, without any valvular abnormalities noted.  Due to her vigorous contraction she was scheduled for [employer].  Myoview was considered a low risk study without any significant ischemia.  She was then scheduled in 2021 for coronary CTA.  Coronary calcium score of 74 which is 96 percentile for age and sex matched control.  Severe two-vessel coronary artery disease.  Transferred over for recommended for cardiac catheterization.  Left heart catheterization was completed on 05/2020 with severe single-vessel coronary artery disease with 80% proximal RCA stenosis that was hemodynamically significant.  Mild to moderate nonobstructive coronary artery disease involving the LAD and left circumflex.  Hyperdynamic left ventricular systolic function with normal filling pressure.  She underwent successful PCI to the proximal RCA.  She indicated Myoview 10/13/2022 that was low risk study.  EF 69%.  She was last seen in clinic 02/25/2023 by Dr. Okey Dupre.  She had reported feeling well but was under an increased amount of stress.  She had been tolerating her medications without any adverse side effects.  There were no medication changes that were made and no additional testing that needed to be ordered at that time.  She returns to clinic today stating that overall she has been doing fairly well.  She has noted as of late that  she has had some dyspnea on exertion that is slightly changed.  She is working on increasing her activity and weight loss and has lost 11 pounds thus far.  She is questioning today if it would benefit her to take some of the newer weight loss medications that are on the market.  She states that she and her daughter has been talking about the various health history is that they both have and they built the same and her daughter is taken semaglutide and is lost over 30 pounds.  She is curious today as to if her exertional dyspnea is related to her weight or if there is something else that is going on.  She states that she has been compliant with her medications without concerns.  She does have a skin disorder that she is getting ready to follow-up with dermatology at [facility] for her as she thinks it may be some psoriatic arthritis.  She denies any hospitalizations or visits to the emergency department.  ROS: 10 point review of systems has been reviewed and considered negative with exception what is been listed in the HPI  Studies Reviewed: [name]   EKG Interpretation Date/Time:  Thursday September 10 2023 13:47:55 EDT Ventricular Rate:  76 PR Interval:  108 QRS Duration:  66 QT Interval:  364 QTC Calculation: 409 R Axis:   -1  Text Interpretation: Sinus rhythm with short PR Minimal voltage criteria for LVH, may be normal variant ( R in aVL ) When compared with ECG of 01-Jun-2020 09:00, PR interval has decreased Confirmed by Charlsie Quest ([id number]) on 09/10/2023 1:52:34 PM  Gated MPI 10/13/22   The study is normal. The study is low risk.   No ST deviation was noted.   Left ventricular function is normal. Nuclear stress EF: 69 %. The left ventricular ejection fraction is hyperdynamic (>65%). End diastolic cavity size is normal. End systolic cavity size is normal.   Prior study available for comparison from 05/19/2017.   Normal stress nuclear study with no ischemia or infarction.  Gated ejection fraction  69% with normal wall motion.  LHC 06/01/2020 Conclusions: Severe single-vessel coronary artery disease with 80% proximal RCA stenosis that is hemodynamically significant by DFR (0.88). Mild to moderate, non-obstructive coronary artery disease involving the LAD and LCx. Hyperdynamic left ventricular systolic function with normal filling pressure. Successful PCI to proximal RCA using Syergy 2.5 x 12 mm drug-eluting stent with 0% residual stenosis and TIMI-3 flow.   Recommendations: Dual antiplatelet therapy with aspirin and clopidogrel for at least 6 months. Aggressive secondary prevention. Anticipate same-day discharge if no post-cath complications.  Coronary CTA 05/10/2020 IMPRESSION: 1. Coronary calcium score of 704. This was 9 percentile for age and sex matched control.   2. Severe two-vessel coronary artery disease. CADRADS 4A. Recommend Cardiac Catheterization.   3.  Normal coronary origin with Co-dominance.   4.  Aortic Atherosclerosis  Lexiscan Myoview 05/19/2017 Low risk study. There is a small in size, moderate in severity, reversible defect at the apex. Given normal wall motion, this most likely represents shifting breast attenuation. However, small area of ischemia cannot be excluded. The left ventricular ejection fraction is hyperdynamic (>65%). Sensitivity and specificity of the study are degraded by significant extracardiac (gut) activity.  TTE 05/05/2017 Study Conclusions   - Left ventricle: The cavity size was normal. Wall thickness was    normal. Systolic function was vigorous. The estimated ejection    fraction was in the range of 65% to 70%. Wall motion was normal;    there were no regional wall motion abnormalities. Doppler    parameters are consistent with abnormal left ventricular    relaxation (grade 1 diastolic dysfunction).  - Pulmonary arteries: Systolic pressure could not be accurately    estimated.   Risk Assessment/Calculations:              Physical Exam:   VS:  BP 110/66 (BP Location: Left Arm, Patient Position: Sitting, Cuff Size: Normal)   Pulse 76   Ht 5' 1.5" (1.562 m)   Wt 201 lb 6.4 oz (91.4 kg)   SpO2 97%   BMI 37.44 kg/m    Wt Readings from Last 3 Encounters:  09/10/23 201 lb 6.4 oz (91.4 kg)  02/25/23 212 lb (96.2 kg)  01/13/23 210 lb (95.3 kg)    GEN: Well nourished, well developed in no acute distress NECK: No JVD; No carotid bruits CARDIAC: RRR, no murmurs, rubs, gallops RESPIRATORY:  Clear to auscultation without rales, wheezing or rhonchi  ABDOMEN: Soft, non-tender, non-distended EXTREMITIES:  No edema; No deformity   ASSESSMENT AND PLAN: .   Coronary disease with stable angina.  She denies any current chest discomfort at this time but is experiencing some exertional dyspnea.  She has been scheduled for an updated echocardiogram.  She is continued on clopidogrel 75 mg daily, atorvastatin 80 mg daily and ezetimibe 10 mg daily, for antianginal she is continued on diltiazem and Ranexa.  EKG today revealed sinus rhythm without concern of ischemia.  HFpEF with an EF of 69% on her nuclear stress and 65-70 on her last echocardiogram.  She  is euvolemic though body habitus does limit evaluation.  She reports NYHA class II symptoms.  She has been scheduled for an updated echocardiogram.  She has also been sent for labs today of the CBC to rule out causes such as anemia and a BM P to reevaluate kidney function and electrolytes for causes of her symptoms.  No current medication changes were made today will defer any additional medications until after her echocardiogram is completed.  Mixed hyperlipidemia where last LDL was 77.  She has been continued on atorvastatin 80 mg daily and ezetimibe 10 mg daily.  She has lost a total of 11 pounds with her lifestyle modification and dietary changes.  We are sending her for an updated lipid panel today.  Primary hypertension with blood pressure today of 110/66.  Blood pressure has  remained stable.  She is continued on diltiazem and benazepril/HCTZ.  She is encouraged to continue to monitor blood pressures 1 to 2 hours postmedication administration at home.  Obesity with a BMI of 37.4.  She is continue to work on dietary modification and increasing her activity.  She has been congratulated on her weight loss of 11 pounds.       Dispo: Patient to return to clinic to see MD/APP once echocardiogram is completed or sooner if needed  Signed, Erynne Kealey, NP

## 2023-09-10 NOTE — Patient Instructions (Signed)
Medication Instructions:  Your Physician recommend you continue on your current medication as directed.    *If you need a refill on your cardiac medications before your next appointment, please call your pharmacy*  Lab Work: Your provider would like for you to have following labs drawn today CBC, BMET and Lipid Panel.   If you have labs (blood work) drawn today and your tests are completely normal, you will receive your results only by: MyChart Message (if you have MyChart) OR A paper copy in the mail If you have any lab test that is abnormal or we need to change your treatment, we will call you to review the results.  Testing/Procedures: Your physician has requested that you have an echocardiogram. Echocardiography is a painless test that uses sound waves to create images of your heart. It provides your doctor with information about the size and shape of your heart and how well your heart's chambers and valves are working.   You may receive an ultrasound enhancing agent through an IV if needed to better visualize your heart during the echo. This procedure takes approximately one hour.  There are no restrictions for this procedure.  This will take place at [address] [facility], [place] [address]  Follow-Up: At [facility], you and your health needs are our priority.  As part of our continuing mission to provide you with exceptional heart care, we have created designated Provider Care Teams.  These Care Teams include your primary Cardiologist (physician) and Advanced Practice Providers (APPs -  Physician Assistants and Nurse Practitioners) who all work together to provide you with the care you need, when you need it.  We recommend signing up for the patient portal called "MyChart".  Sign up information is provided on this After Visit Summary.  MyChart is used to connect with patients for Virtual Visits (Telemedicine).  Patients are able to view lab/test  results, encounter notes, upcoming appointments, etc.  Non-urgent messages can be sent to your provider as well.   To learn more about what you can do with MyChart, go to [email or web address].    Your next appointment:   Will be scheduled to be after your Echocardiogram  Provider:   You may see Yvonne Kendall, MD or one of the following Advanced Practice Providers on your designated Care Team:   Nicolasa Ducking, NP Eula Listen, PA-C Cadence Fransico Michael, PA-C Charlsie Quest, NP

## 2023-09-11 LAB — LIPID PANEL
Chol/HDL Ratio: 2.2 ratio (ref 0.0–4.4)
Cholesterol, Total: 143 mg/dL (ref 100–199)
HDL: 64 mg/dL (ref >39)
LDL Chol Calc (NIH): 61 mg/dL (ref 0–99)
Triglycerides: 98 mg/dL (ref 0–149)
VLDL Cholesterol Cal: 18 mg/dL (ref 5–40)

## 2023-09-11 LAB — BASIC METABOLIC PANEL
BUN/Creatinine Ratio: 15 (ref 12–28)
BUN: 12 mg/dL (ref 8–27)
CO2: 24 mmol/L (ref 20–29)
Calcium: 9.8 mg/dL (ref 8.7–10.3)
Chloride: 98 mmol/L (ref 96–106)
Creatinine, Ser: 0.81 mg/dL (ref 0.57–1.00)
Glucose: 84 mg/dL (ref 70–99)
Potassium: 4.5 mmol/L (ref 3.5–5.2)
Sodium: 138 mmol/L (ref 134–144)
eGFR: 78 mL/min/{1.73_m2} (ref >59)

## 2023-09-11 LAB — CBC
Hematocrit: 45.9 % (ref 34.0–46.6)
Hemoglobin: 15.4 g/dL (ref 11.1–15.9)
MCH: 32.9 pg (ref 26.6–33.0)
MCHC: 33.6 g/dL (ref 31.5–35.7)
MCV: 98 fL — ABNORMAL HIGH (ref 79–97)
Platelets: 199 10*3/uL (ref 150–450)
RBC: 4.68 x10E6/uL (ref 3.77–5.28)
RDW: 11.8 % (ref 11.7–15.4)
WBC: 5.8 10*3/uL (ref 3.4–10.8)

## 2023-09-11 NOTE — Progress Notes (Signed)
Cholesterol remains at goal.  BMP and CBC are unremarkable.  No changes to current medication regimen.

## 2023-09-16 ENCOUNTER — Ambulatory Visit: Payer: Medicare HMO | Attending: Cardiology

## 2023-10-01 ENCOUNTER — Other Ambulatory Visit: Payer: Self-pay | Admitting: Internal Medicine

## 2023-10-01 DIAGNOSIS — I25119 Atherosclerotic heart disease of native coronary artery with unspecified angina pectoris: Secondary | ICD-10-CM

## 2023-10-06 ENCOUNTER — Other Ambulatory Visit: Payer: Self-pay | Admitting: Nurse Practitioner

## 2023-10-06 ENCOUNTER — Other Ambulatory Visit: Payer: Self-pay | Admitting: Internal Medicine

## 2023-10-06 DIAGNOSIS — F411 Generalized anxiety disorder: Secondary | ICD-10-CM

## 2023-10-06 DIAGNOSIS — I25119 Atherosclerotic heart disease of native coronary artery with unspecified angina pectoris: Secondary | ICD-10-CM

## 2023-10-08 ENCOUNTER — Ambulatory Visit: Payer: Medicare HMO

## 2023-10-08 ENCOUNTER — Ambulatory Visit: Payer: Medicare HMO | Admitting: Cardiology

## 2023-10-21 ENCOUNTER — Ambulatory Visit: Payer: Medicare HMO | Admitting: Cardiology

## 2023-10-28 ENCOUNTER — Ambulatory Visit: Payer: Medicare HMO | Attending: Cardiology

## 2023-11-03 ENCOUNTER — Ambulatory Visit: Payer: Medicare HMO | Admitting: Cardiology

## 2023-11-11 ENCOUNTER — Ambulatory Visit: Payer: Medicare HMO

## 2023-11-28 ENCOUNTER — Other Ambulatory Visit: Payer: Self-pay | Admitting: Internal Medicine

## 2023-11-30 ENCOUNTER — Ambulatory Visit: Payer: Medicare HMO | Admitting: Cardiology

## 2023-11-30 NOTE — Telephone Encounter (Signed)
last visit 09/10/23 with plan to f/u after echo (echo on 12/4) next visit:  12/11/23

## 2023-12-02 ENCOUNTER — Ambulatory Visit: Payer: Medicare HMO | Attending: Cardiology

## 2023-12-02 DIAGNOSIS — R0609 Other forms of dyspnea: Secondary | ICD-10-CM | POA: Diagnosis not present

## 2023-12-02 LAB — ECHOCARDIOGRAM COMPLETE
AR max vel: 1.17 cm2
AV Area VTI: 1.19 cm2
AV Area mean vel: 1.16 cm2
AV Mean grad: 13.2 mm[Hg]
AV Peak grad: 24.4 mm[Hg]
Ao pk vel: 2.47 m/s
P 1/2 time: 570 ms
S' Lateral: 2.3 cm

## 2023-12-04 NOTE — Progress Notes (Signed)
Heart squeeze 60 to 65%, no wall motion abnormalities, mild stiffness noted in the muscle likely from [facility] blood pressure.  There is mild leakage in the mitral valve which is unchanged and leakage noted in the aortic valve.  There is also some slight narrowing of the aortic valve called aortic stenosis.  Continue to monitor with surveillance studies.

## 2023-12-11 ENCOUNTER — Encounter: Payer: Self-pay | Admitting: Cardiology

## 2023-12-11 ENCOUNTER — Ambulatory Visit: Payer: Medicare HMO | Attending: Cardiology | Admitting: Cardiology

## 2023-12-11 VITALS — BP 120/72 | HR 79 | Ht 62.0 in | Wt 205.2 lb

## 2023-12-11 DIAGNOSIS — E782 Mixed hyperlipidemia: Secondary | ICD-10-CM

## 2023-12-11 DIAGNOSIS — I25118 Atherosclerotic heart disease of native coronary artery with other forms of angina pectoris: Secondary | ICD-10-CM

## 2023-12-11 DIAGNOSIS — I10 Essential (primary) hypertension: Secondary | ICD-10-CM

## 2023-12-11 DIAGNOSIS — I5032 Chronic diastolic (congestive) heart failure: Secondary | ICD-10-CM

## 2023-12-11 MED ORDER — DILTIAZEM HCL ER COATED BEADS 180 MG PO CP24: ORAL_CAPSULE | ORAL | 3 refills | Status: DC

## 2023-12-11 NOTE — Patient Instructions (Signed)
Medication Instructions:  - No changes *If you need a refill on your cardiac medications before your next appointment, please call your pharmacy*  Lab Work: - None ordered If you have labs (blood work) drawn today and your tests are completely normal, you will receive your results only by: MyChart Message (if you have MyChart) OR A paper copy in the mail If you have any lab test that is abnormal or we need to change your treatment, we will call you to review the results.  Testing/Procedures: - None ordered  Follow-Up: At [facility], you and your health needs are our priority.  As part of our continuing mission to provide you with exceptional heart care, we have created designated Provider Care Teams.  These Care Teams include your primary Cardiologist (physician) and Advanced Practice Providers (APPs -  Physician Assistants and Nurse Practitioners) who all work together to provide you with the care you need, when you need it.  Your next appointment:   3 month(s)  Provider:   Yvonne Kendall, MD

## 2023-12-11 NOTE — Progress Notes (Signed)
Cardiology Office Note:  .   Date:  12/11/2023  ID:  Diane [name], DOB Mar 13, 1951, MRN [id number] PCP: Cleopatra Cedar, NP  [facility] Providers Cardiologist:  Yvonne Kendall, MD    History of Present Illness: .   Diane [name] is a 72 y.o. female with a past medical history of coronary artery disease status post PCI to the proximal RCA (05/2011), diastolic dysfunction, hypertension, hyperlipidemia, stroke, GERD, and chronic HFpEF, who is here today for follow-up.   Echocardiogram completed in 04/2017 revealed LVEF of 65 to 70%, wall motion was normal without abnormality, G1 DD, without any valvular abnormalities noted.  Due to her vigorous contraction she was scheduled for [employer].  Myoview was considered a low risk study without any significant ischemia.  She was then scheduled in 2021 for coronary CTA.  Coronary calcium score of 74 which is 96 percentile for age and sex matched control.  Severe two-vessel coronary artery disease.  Transferred over for recommended for cardiac catheterization.  Left heart catheterization was completed on 05/2020 with severe single-vessel coronary artery disease with 80% proximal RCA stenosis that was hemodynamically significant.  Mild to moderate nonobstructive coronary artery disease involving the LAD and left circumflex.  Hyperdynamic left ventricular systolic function with normal filling pressure.  She underwent successful PCI to the proximal RCA.  She indicated Myoview 10/13/2022 that was low risk study.  EF 69%.    She was last seen in clinic 09/17/2023.  At that time she been overall doing well.  She had noticed a clinic that she was experiencing some dyspnea on exertion that was slightly changed.  She was working on increasing her activity and weight loss and has lost 11 pounds.  He was sent for follow-up labs.  There were no medication changes that were made.  She was scheduled for an updated echocardiogram.  She presented to the  emergency department at [facility] 09/22/2023 via EMS with complaints of chest pain.  Pain was reported to be constant located on the left side of her chest with radiation about 2 the left side of her neck and down her left arm.  She reported returning to bowling the basement while working on laundry wonders what was the cause of her pain began a cardiac history presented to the emergency department for further evaluation and treatment.  She was hospitalized and underwent stress testing which revealed no ischemia or infarct.  Cardiac troponin was within normal limits and no evidence of MI.  ECG reviewed and it does confirm sinus tachycardia and negative for acute ischemia or infarct.  Labs were unremarkable.  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  She was ruled out for any major cardiac event.  Vital signs remained stable and she was subsequently discharged 09/24/2023.  She returns to clinic today stating for the cardiac perspective she has been doing fairly well.  She does continue to complain of left shoulder pain but understands that now her shoulder pain is related to arthritis and bone spurs that are affecting the tendon.  She underwent recent [facility] in [facility] and just had an echocardiogram done here to rule out any major cardiac event.  She denies any chest pain today she states that she does suffer from insomnia.  She is slightly distressed she stated when she was discharged from the hospital she has 8 children and they sent an Benedetto Goad to pick her up.  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  She has unfortunately been out of her diltiazem for the past 4 to 5 days.  Dates that she has been compliant with the rest of her medications with no further chest discomfort.  ROS: 10 point review of systems has been reviewed and considered negative except what is been listed in the HPI  Studies Reviewed: .       2D echo 12/02/2023 1. Left ventricular ejection fraction, by estimation,  is 60 to 65%. The  left ventricle has normal function. The left ventricle has no regional  wall motion abnormalities. Left ventricular diastolic parameters are  consistent with Grade I diastolic  dysfunction (impaired relaxation). The average left ventricular global  longitudinal strain is -15.8 %. The global longitudinal strain is  abnormal.   2. Right ventricular systolic function is normal. The right ventricular  size is normal. There is normal pulmonary artery systolic pressure.   3. The mitral valve is normal in structure. Mild mitral valve  regurgitation. No evidence of mitral stenosis.   4. The aortic valve is calcified. There is mild calcification of the  aortic valve. There is mild thickening of the aortic valve. Aortic valve  regurgitation is mild. Mild aortic valve stenosis. Aortic valve mean  gradient measures 13.2 mmHg.   5. The inferior vena cava is normal in size with greater than 50%  respiratory variability, suggesting right atrial pressure of 3 mmHg.   Lexiscan MPI 09/23/23 (outside facility) Impression  1. No reversible ischemia or infarction.  2. Normal left ventricular wall motion.  3. Left ventricular ejection fraction 74%  4. Non invasive risk stratification*: Low  Gated MPI 10/13/22   The study is normal. The study is low risk.   No ST deviation was noted.   Left ventricular function is normal. Nuclear stress EF: 69 %. The left ventricular ejection fraction is hyperdynamic (>65%). End diastolic cavity size is normal. End systolic cavity size is normal.   Prior study available for comparison from 05/19/2017.   Normal stress nuclear study with no ischemia or infarction.  Gated ejection fraction 69% with normal wall motion.   LHC 06/01/2020 Conclusions: Severe single-vessel coronary artery disease with 80% proximal RCA stenosis that is hemodynamically significant by DFR (0.88). Mild to moderate, non-obstructive coronary artery disease involving the LAD and  LCx. Hyperdynamic left ventricular systolic function with normal filling pressure. Successful PCI to proximal RCA using Syergy 2.5 x 12 mm drug-eluting stent with 0% residual stenosis and TIMI-3 flow.   Recommendations: Dual antiplatelet therapy with aspirin and clopidogrel for at least 6 months. Aggressive secondary prevention. Anticipate same-day discharge if no post-cath complications.   Coronary CTA 05/10/2020 IMPRESSION: 1. Coronary calcium score of 704. This was 73 percentile for age and sex matched control.   2. Severe two-vessel coronary artery disease. CADRADS 4A. Recommend Cardiac Catheterization.   3.  Normal coronary origin with Co-dominance.   4.  Aortic Atherosclerosis   Lexiscan Myoview 05/19/2017 Low risk study. There is a small in size, moderate in severity, reversible defect at the apex. Given normal wall motion, this most likely represents shifting breast attenuation. However, small area of ischemia cannot be excluded. The left ventricular ejection fraction is hyperdynamic (>65%). Sensitivity and specificity of the study are degraded by significant extracardiac (gut) activity.   TTE 05/05/2017 Study Conclusions   - Left ventricle: The cavity size was normal. Wall thickness was    normal. Systolic function was vigorous. The estimated ejection    fraction was in the range of 65%  to 70%. Wall motion was normal;    there were no regional wall motion abnormalities. Doppler    parameters are consistent with abnormal left ventricular    relaxation (grade 1 diastolic dysfunction).  - Pulmonary arteries: Systolic pressure could not be accurately    estimated.  Risk Assessment/Calculations:             Physical Exam:   VS:  BP 120/72 (BP Location: Right Arm, Patient Position: Sitting, Cuff Size: Large)   Pulse 79   Ht 5\' 2"  (1.575 m)   Wt 205 lb 3.2 oz (93.1 kg)   SpO2 95%   BMI 37.53 kg/m    Wt Readings from Last 3 Encounters:  12/11/23 205 lb 3.2 oz (93.1 kg)   09/10/23 201 lb 6.4 oz (91.4 kg)  02/25/23 212 lb (96.2 kg)    GEN: Well nourished, well developed in no acute distress NECK: No JVD; No carotid bruits CARDIAC: RRR, no murmurs, rubs, gallops RESPIRATORY:  Clear to auscultation without rales, wheezing or rhonchi  ABDOMEN: Soft, non-tender, non-distended EXTREMITIES:  No edema; No deformity   ASSESSMENT AND PLAN: .   Coronary artery disease with stable angina.  She denies any current chest discomfort and recently had a Lexiscan Myoview completed at [facility] which revealed a low risk without ischemia or infarct.  Her echocardiogram also revealed an LVEF of 60 to 65%, no RWMA, G1 DD, and mild mitral regurgitation and mild arctic stenosis.  She is continued on clopidogrel 75 mg daily, atorvastatin 80 mg daily and ezetimibe 10 mg daily.  She is also continued on antianginal of diltiazem and Ranexa.  Unfortunately she has been out of her diltiazem for the past 4 to 5 days with an updated prescription being sent to the pharmacy of choice.  HFpEF with an EF of 60 to 65% on recent echocardiogram.  She is euvolemic with her body habitus does limited evaluation.  She reports NYHA class I-II symptoms.  She has not required any diuretic therapy.  She is continued on benazepril and HCTZ and diltiazem.  Medication changes were made today.  Mixed hyperlipidemia with an LDL of 61.  She is continued on atorvastatin 80 mg daily and ezetimibe 10 mg daily.  Primary hypertension blood pressure today 120/72.  Continued on Lotensin HCTZ 12/12.5 mg and diltiazem 180 mg daily.  Blood pressures remain stable.  She has been encouraged to continue to monitor her blood pressure 1 to 2 hours postmedication administration.  Morbid obesity with a BMI of 37.53 which is slowly improving.  Weight loss would be beneficial.  She has been encouraged to increase activity and decrease caloric intake.       Dispo: Patient return to clinic with MD/APP in 3 months or sooner if needed for  reevaluation of symptoms.  Signed, Jazzalyn Loewenstein, NP

## 2023-12-23 ENCOUNTER — Other Ambulatory Visit: Payer: Self-pay | Admitting: Cardiology

## 2024-01-18 ENCOUNTER — Other Ambulatory Visit: Payer: Self-pay | Admitting: Internal Medicine

## 2024-01-18 DIAGNOSIS — I25119 Atherosclerotic heart disease of native coronary artery with unspecified angina pectoris: Secondary | ICD-10-CM

## 2024-03-02 ENCOUNTER — Ambulatory Visit: Payer: Medicare HMO | Admitting: Internal Medicine

## 2024-03-02 NOTE — Progress Notes (Deleted)
  Cardiology Office Note:  .   Date:  03/02/2024  ID:  Diane [name], DOB 10-19-1951, MRN [id number] PCP: Cleopatra Cedar, NP  [facility] Providers Cardiologist:  Yvonne Kendall, MD { Click to update primary MD,subspecialty MD or APP then REFRESH:1}    History of Present Illness: .   Diane [name] is a 73 y.o. female with history of coronary artery disease status post PCI to the proximal RCA (05/2011), chronic HFpEF, hypertension, hyperlipidemia, stroke, and GERD, who presents for follow-up of CAD and HFpEF.  She was last seen in our office in 11/2023 by Lavonna Rua hammock, NP, at which time her only complaint was of persistent left shoulder pain felt to be musculoskeletal in nature.  She had undergone stress testing at [facility] in September after presenting to the ED with left shoulder/chest pain; this was normal.  ROS: See HPI  Studies Reviewed: [name]       TTE (12/02/2023): Normal LV size and wall thickness.  LVEF 60-65% with normal wall motion and grade 1 diastolic dysfunction.  Global longitudinal strain -15.8%.  Normal RV size and function.  Normal PA pressure.  Normal biatrial size.  No pericardial effusion.  Mild mitral regurgitation.  Mild aortic regurgitation and stenosis (mean gradient 13 mmHg, AVA 1.2 cm).  Pharmacologic MPI (09/23/2023): Low risk study without evidence of ischemia or scar.  LVEF greater than 65%. Risk Assessment/Calculations:   {Does this patient have ATRIAL FIBRILLATION?:[id number]} No BP recorded.  {Refresh Note OR Click here to enter BP  :1}***       Physical Exam:   VS:  There were no vitals taken for this visit.   Wt Readings from Last 3 Encounters:  12/11/23 205 lb 3.2 oz (93.1 kg)  09/10/23 201 lb 6.4 oz (91.4 kg)  02/25/23 212 lb (96.2 kg)    General:  NAD. Neck: No JVD or HJR. Lungs: Clear to auscultation bilaterally without wheezes or crackles. Heart: Regular rate and rhythm without murmurs, rubs, or gallops. Abdomen: Soft,  nontender, nondistended. Extremities: No lower extremity edema.  ASSESSMENT AND PLAN: .    ***    {Are you ordering a CV Procedure (e.g. stress test, cath, DCCV, TEE, etc)?   Press F2        :[id number]}  Dispo: ***  Signed, Yvonne Kendall, MD

## 2024-03-22 ENCOUNTER — Other Ambulatory Visit: Payer: Self-pay | Admitting: Cardiology

## 2024-03-23 ENCOUNTER — Other Ambulatory Visit: Payer: Self-pay | Admitting: Internal Medicine

## 2024-04-01 ENCOUNTER — Ambulatory Visit: Admitting: Cardiology

## 2024-04-01 NOTE — Progress Notes (Deleted)
 Cardiology Office Note:  .   Date:  04/01/2024  ID:  Diane [name], DOB 1951/12/21, MRN [id number] PCP: Cleopatra Cedar, NP  [facility] Providers Cardiologist:  Yvonne Kendall, MD { Click to update primary MD,subspecialty MD or APP then REFRESH:1}   History of Present Illness: .   Diane [name] is a 73 y.o. female with a past medical history of coronary disease status post PCI to the proximal RCA (05/2011), diastolic dysfunction, hypertension, hyperlipidemia, stroke, GERD, chronic HFpEF, who is here today for follow-up.   Echocardiogram completed in 04/2017 revealed LVEF of 65 to 70%, wall motion was normal without abnormality, G1 DD, without any valvular abnormalities noted.  Due to her vigorous contraction she was scheduled for [employer].  Myoview was considered a low risk study without any significant ischemia.  She was then scheduled in 2021 for coronary CTA.  Coronary calcium score of 74 which is 96 percentile for age and sex matched control.  Severe two-vessel coronary artery disease.  Transferred over for recommended for cardiac catheterization.  Left heart catheterization was completed on 05/2020 with severe single-vessel coronary artery disease with 80% proximal RCA stenosis that was hemodynamically significant.  Mild to moderate nonobstructive coronary artery disease involving the LAD and left circumflex.  Hyperdynamic left ventricular systolic function with normal filling pressure.  She underwent successful PCI to the proximal RCA.  She indicated Myoview 10/13/2022 that was low risk study.  EF 69%.  She had also presented to [facility] 09/18/2023 via EMS with complaints of chest pain.  Evaluation in the emergency department was completed and she was hospitalized and underwent stress testing which revealed no ischemia or infarct.    She was last seen in clinic 12/17/2023.  At that time she stated she was doing fairly well from a cardiac perspective but continued to  complain of left shoulder pain.  She had medications that were refilled no other changes were made and no further testing was ordered.  She returns to clinic today   ROS: 10 point review of systems has been reviewed and considered negative except ones are listed in HPI  Studies Reviewed: .        2D echo 12/02/2023 1. Left ventricular ejection fraction, by estimation, is 60 to 65%. The  left ventricle has normal function. The left ventricle has no regional  wall motion abnormalities. Left ventricular diastolic parameters are  consistent with Grade I diastolic  dysfunction (impaired relaxation). The average left ventricular global  longitudinal strain is -15.8 %. The global longitudinal strain is  abnormal.   2. Right ventricular systolic function is normal. The right ventricular  size is normal. There is normal pulmonary artery systolic pressure.   3. The mitral valve is normal in structure. Mild mitral valve  regurgitation. No evidence of mitral stenosis.   4. The aortic valve is calcified. There is mild calcification of the  aortic valve. There is mild thickening of the aortic valve. Aortic valve  regurgitation is mild. Mild aortic valve stenosis. Aortic valve mean  gradient measures 13.2 mmHg.   5. The inferior vena cava is normal in size with greater than 50%  respiratory variability, suggesting right atrial pressure of 3 mmHg.    Lexiscan MPI 09/23/23 (outside facility) Impression   1. No reversible ischemia or infarction.  2. Normal left ventricular wall motion.  3. Left ventricular ejection fraction 74%  4. Non invasive risk stratification*: Low   Gated MPI 10/13/22   The study  is normal. The study is low risk.   No ST deviation was noted.   Left ventricular function is normal. Nuclear stress EF: 69 %. The left ventricular ejection fraction is hyperdynamic (>65%). End diastolic cavity size is normal. End systolic cavity size is normal.   Prior study available for  comparison from 05/19/2017.   Normal stress nuclear study with no ischemia or infarction.  Gated ejection fraction 69% with normal wall motion.   LHC 06/01/2020 Conclusions: Severe single-vessel coronary artery disease with 80% proximal RCA stenosis that is hemodynamically significant by DFR (0.88). Mild to moderate, non-obstructive coronary artery disease involving the LAD and LCx. Hyperdynamic left ventricular systolic function with normal filling pressure. Successful PCI to proximal RCA using Syergy 2.5 x 12 mm drug-eluting stent with 0% residual stenosis and TIMI-3 flow.   Recommendations: Dual antiplatelet therapy with aspirin and clopidogrel for at least 6 months. Aggressive secondary prevention. Anticipate same-day discharge if no post-cath complications.   Coronary CTA 05/10/2020 IMPRESSION: 1. Coronary calcium score of 704. This was 63 percentile for age and sex matched control.   2. Severe two-vessel coronary artery disease. CADRADS 4A. Recommend Cardiac Catheterization.   3.  Normal coronary origin with Co-dominance.   4.  Aortic Atherosclerosis   Lexiscan Myoview 05/19/2017 Low risk study. There is a small in size, moderate in severity, reversible defect at the apex. Given normal wall motion, this most likely represents shifting breast attenuation. However, small area of ischemia cannot be excluded. The left ventricular ejection fraction is hyperdynamic (>65%). Sensitivity and specificity of the study are degraded by significant extracardiac (gut) activity.   TTE 05/05/2017 Study Conclusions   - Left ventricle: The cavity size was normal. Wall thickness was    normal. Systolic function was vigorous. The estimated ejection    fraction was in the range of 65% to 70%. Wall motion was normal;    there were no regional wall motion abnormalities. Doppler    parameters are consistent with abnormal left ventricular    relaxation (grade 1 diastolic dysfunction).  - Pulmonary  arteries: Systolic pressure could not be accurately    estimated.  Risk Assessment/Calculations:     No BP recorded.  {Refresh Note OR Click here to enter BP  :1}***       Physical Exam:   VS:  There were no vitals taken for this visit.   Wt Readings from Last 3 Encounters:  12/11/23 205 lb 3.2 oz (93.1 kg)  09/10/23 201 lb 6.4 oz (91.4 kg)  02/25/23 212 lb (96.2 kg)    GEN: Well nourished, well developed in no acute distress NECK: No JVD; No carotid bruits CARDIAC: ***RRR, no murmurs, rubs, gallops RESPIRATORY:  Clear to auscultation without rales, wheezing or rhonchi  ABDOMEN: Soft, non-tender, non-distended EXTREMITIES:  No edema; No deformity   ASSESSMENT AND PLAN: .   Coronary disease unstable angina Chronic HFpEF with last LVEF of 60 to 65% Mixed hyperlipidemia Primary hypertension Morbid obesity    {Are you ordering a CV Procedure (e.g. stress test, cath, DCCV, TEE, etc)?   Press F2        :[id number]}  Dispo: ***  Signed, Krithika Tome, NP

## 2024-04-05 NOTE — Progress Notes (Unsigned)
 Cardiology Office Note:  .   Date:  04/06/2024  ID:  Diane [name], DOB 04/12/1951, MRN [id number] PCP: Diane Cedar, NP  [facility] Providers Cardiologist:  Yvonne Kendall, MD    History of Present Illness: .   Diane [name] is a 73 y.o. female with a past medical history of coronary disease status post PCI to the proximal RCA (05/2011), diastolic dysfunction, hypertension, hyperlipidemia, stroke, GERD, chronic HFpEF, who is here today for follow-up.   Echocardiogram completed in 04/2017 revealed LVEF of 65 to 70%, wall motion was normal without abnormality, G1 DD, without any valvular abnormalities noted.  Due to her vigorous contraction she was scheduled for [employer].  Myoview was considered a low risk study without any significant ischemia.  She was then scheduled in 2021 for coronary CTA.  Coronary calcium score of 74 which is 96 percentile for age and sex matched control.  Severe two-vessel coronary artery disease.  Transferred over for recommended for cardiac catheterization.  Left heart catheterization was completed on 05/2020 with severe single-vessel coronary artery disease with 80% proximal RCA stenosis that was hemodynamically significant.  Mild to moderate nonobstructive coronary artery disease involving the LAD and left circumflex.  Hyperdynamic left ventricular systolic function with normal filling pressure.  She underwent successful PCI to the proximal RCA.  She indicated Myoview 10/13/2022 that was low risk study.  EF 69%.  She had also presented to [facility] 09/18/2023 via EMS with complaints of chest pain.  Evaluation in the emergency department was completed and she was hospitalized and underwent stress testing which revealed no ischemia or infarct.    She was last seen in clinic 12/17/2023.  At that time she stated she was doing fairly well from a cardiac perspective but continued to complain of left shoulder pain.  She had medications that were  refilled no other changes were made and no further testing was ordered.  She returns to clinic today stating overall she has done well from the cardiac perspective.  She does have shortness of breath that she has contributed to allergies.  She continues to complain of left and right shoulder pain.  Previously was evaluated by orthopedics and was advised that his arthritis and bones partners that are affecting the tendon.  She states that she had injections to her shoulders but unfortunately her right shoulder is still extremely painful and is inhibiting her activities of daily living.  She is also in the process of looking for a primary care provider.  She denies any chest pain, palpitations, peripheral edema.  She is complaining of weight gain and is inquisitive about weight loss medications today as well.  States that she has been compliant with her current medication regimen.  She was having issues with sleep distress with starting a magnesium supplement at bedtime which helped with sleep.  Denies any recent hospitalizations or visits to the emergency department.  ROS: 10 point review of systems has been reviewed and considered negative except ones are listed in HPI  Studies Reviewed: [name]   EKG Interpretation Date/Time:  Wednesday April 06 2024 15:56:48 EDT Ventricular Rate:  69 PR Interval:  148 QRS Duration:  68 QT Interval:  390 QTC Calculation: 417 R Axis:   -3  Text Interpretation: Normal sinus rhythm Normal ECG When compared with ECG of 10-Sep-2023 13:47, No significant change was found Confirmed by Charlsie Quest ([id number]) on 04/06/2024 4:01:57 PM    2D echo 12/02/2023 1. Left ventricular ejection fraction, by  estimation, is 60 to 65%. The  left ventricle has normal function. The left ventricle has no regional  wall motion abnormalities. Left ventricular diastolic parameters are  consistent with Grade I diastolic  dysfunction (impaired relaxation). The average left ventricular global   longitudinal strain is -15.8 %. The global longitudinal strain is  abnormal.   2. Right ventricular systolic function is normal. The right ventricular  size is normal. There is normal pulmonary artery systolic pressure.   3. The mitral valve is normal in structure. Mild mitral valve  regurgitation. No evidence of mitral stenosis.   4. The aortic valve is calcified. There is mild calcification of the  aortic valve. There is mild thickening of the aortic valve. Aortic valve  regurgitation is mild. Mild aortic valve stenosis. Aortic valve mean  gradient measures 13.2 mmHg.   5. The inferior vena cava is normal in size with greater than 50%  respiratory variability, suggesting right atrial pressure of 3 mmHg.    Lexiscan MPI 09/23/23 (outside facility) Impression   1. No reversible ischemia or infarction.  2. Normal left ventricular wall motion.  3. Left ventricular ejection fraction 74%  4. Non invasive risk stratification*: Low   Gated MPI 10/13/22   The study is normal. The study is low risk.   No ST deviation was noted.   Left ventricular function is normal. Nuclear stress EF: 69 %. The left ventricular ejection fraction is hyperdynamic (>65%). End diastolic cavity size is normal. End systolic cavity size is normal.   Prior study available for comparison from 05/19/2017.   Normal stress nuclear study with no ischemia or infarction.  Gated ejection fraction 69% with normal wall motion.   LHC 06/01/2020 Conclusions: Severe single-vessel coronary artery disease with 80% proximal RCA stenosis that is hemodynamically significant by DFR (0.88). Mild to moderate, non-obstructive coronary artery disease involving the LAD and LCx. Hyperdynamic left ventricular systolic function with normal filling pressure. Successful PCI to proximal RCA using Syergy 2.5 x 12 mm drug-eluting stent with 0% residual stenosis and TIMI-3 flow.   Recommendations: Dual antiplatelet therapy with aspirin and  clopidogrel for at least 6 months. Aggressive secondary prevention. Anticipate same-day discharge if no post-cath complications.   Coronary CTA 05/10/2020 IMPRESSION: 1. Coronary calcium score of 704. This was 20 percentile for age and sex matched control.   2. Severe two-vessel coronary artery disease. CADRADS 4A. Recommend Cardiac Catheterization.   3.  Normal coronary origin with Co-dominance.   4.  Aortic Atherosclerosis   Lexiscan Myoview 05/19/2017 Low risk study. There is a small in size, moderate in severity, reversible defect at the apex. Given normal wall motion, this most likely represents shifting breast attenuation. However, small area of ischemia cannot be excluded. The left ventricular ejection fraction is hyperdynamic (>65%). Sensitivity and specificity of the study are degraded by significant extracardiac (gut) activity.   TTE 05/05/2017 Study Conclusions   - Left ventricle: The cavity size was normal. Wall thickness was    normal. Systolic function was vigorous. The estimated ejection    fraction was in the range of 65% to 70%. Wall motion was normal;    there were no regional wall motion abnormalities. Doppler    parameters are consistent with abnormal left ventricular    relaxation (grade 1 diastolic dysfunction).  - Pulmonary arteries: Systolic pressure could not be accurately    estimated.  Risk Assessment/Calculations:             Physical Exam:   VS:  BP 116/80   Pulse 69   Ht 5' 1.5" (1.562 m)   Wt 209 lb 9.6 oz (95.1 kg)   SpO2 96%   BMI 38.96 kg/m    Wt Readings from Last 3 Encounters:  04/06/24 209 lb 9.6 oz (95.1 kg)  12/11/23 205 lb 3.2 oz (93.1 kg)  09/10/23 201 lb 6.4 oz (91.4 kg)    GEN: Well nourished, well developed in no acute distress NECK: No JVD; No carotid bruits CARDIAC: RRR, no murmurs, rubs, gallops RESPIRATORY:  Clear to auscultation without rales, wheezing or rhonchi  ABDOMEN: Soft, non-tender, non-distended EXTREMITIES:   No edema; No deformity   ASSESSMENT AND PLAN: .   Coronary disease, native coronary arteries with stable angina.  She denies any current chest discomfort.  Previous testing was unrevealing.  EKG today reveals sinus rhythm rate of 69 with negative ischemic changes noted.  She is continued on clopidogrel 75 mg daily, atorvastatin 80 mg daily, ezetimibe 10 mg daily, Ranexa 500 mg twice daily that she requests refill today.  Chronic HFpEF with last LVEF of 60 to 65% on recent echocardiogram.  She appears to be euvolemic on exam but her body habitus limits evaluation.  She reports NYHA class II symptoms.  She has not required the use of any diuretic therapy as she has been continued on benazepril HCTZ and diltiazem.  Mixed hyperlipidemia with last LDL 161.  She is continued on atorvastatin 80 mg daily and ezetimibe 10 mg  daily.  Primary hypertension.  With weight at 116/80.  Blood pressure remains well-controlled.  She is continued on benazepril HCTZ 20/12.5 mg as well as diltiazem 180 mg daily.  She has been encouraged to continue to monitor blood pressures 1 to 2 hours postmedication administration as well.  Morbid obesity with a BMI of 38.96.  Weight loss would be beneficial.  She has prescribed Ozempic due to her coronary artery disease and weight.  She continues to work on increasing her activity and dietary changes.  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  Ongoing management per orthopedics       Dispo: Patient return to clinic to see me/APP in 3 months or sooner for reevaluation of symptoms.  Signed, Tajha Sammarco, NP

## 2024-04-06 ENCOUNTER — Encounter: Payer: Self-pay | Admitting: Cardiology

## 2024-04-06 ENCOUNTER — Ambulatory Visit: Attending: Cardiology | Admitting: Cardiology

## 2024-04-06 VITALS — BP 116/80 | HR 69 | Ht 61.5 in | Wt 209.6 lb

## 2024-04-06 DIAGNOSIS — M1990 Unspecified osteoarthritis, unspecified site: Secondary | ICD-10-CM

## 2024-04-06 DIAGNOSIS — I10 Essential (primary) hypertension: Secondary | ICD-10-CM

## 2024-04-06 DIAGNOSIS — I25118 Atherosclerotic heart disease of native coronary artery with other forms of angina pectoris: Secondary | ICD-10-CM | POA: Diagnosis not present

## 2024-04-06 DIAGNOSIS — E782 Mixed hyperlipidemia: Secondary | ICD-10-CM | POA: Diagnosis not present

## 2024-04-06 DIAGNOSIS — I5032 Chronic diastolic (congestive) heart failure: Secondary | ICD-10-CM

## 2024-04-06 MED ORDER — RANOLAZINE ER 500 MG PO TB12: 500.0000 mg | ORAL_TABLET | Freq: Two times a day (BID) | ORAL | 0 refills | Status: DC

## 2024-04-06 MED ORDER — OZEMPIC (0.25 OR 0.5 MG/DOSE) 2 MG/1.5ML ~~LOC~~ SOPN: 0.2500 mg | PEN_INJECTOR | SUBCUTANEOUS | 0 refills | Status: DC

## 2024-04-06 NOTE — Patient Instructions (Addendum)
 Medication Instructions:  Your physician recommends the following medication changes.  START TAKING: Start taking Ozempic:  Inject 0.25 MG into skin once a week, for 4 weeks.     Inject 0.50 MG into skin once a week, for 4 weeks. (Call or send [place] a MyChart message when you are on week 2, so we can send your next dose in for you).     Inject 1.0 MG into skin once a week, for 4 weeks.     Inject 2.0 MG into skin once a week, and continue at this dose.   *If you need a refill on your cardiac medications before your next appointment, please call your pharmacy*  Lab Work: No labs ordered today  If you have labs (blood work) drawn today and your tests are completely normal, you will receive your results only by: MyChart Message (if you have MyChart) OR A paper copy in the mail If you have any lab test that is abnormal or we need to change your treatment, we will call you to review the results.  Testing/Procedures: No test ordered today   Follow-Up: At [facility], you and your health needs are our priority.  As part of our continuing mission to provide you with exceptional heart care, our providers are all part of one team.  This team includes your primary Cardiologist (physician) and Advanced Practice Providers or APPs (Physician Assistants and Nurse Practitioners) who all work together to provide you with the care you need, when you need it.  Your next appointment:   3 month(s)  Provider:   You may see Yvonne Kendall, MD or one of the following Advanced Practice Providers on your designated Care Team:   Nicolasa Ducking, NP Ames Dura, PA-C Eula Listen, PA-C Cadence Bronson, PA-C Charlsie Quest, NP Carlos Levering, NP    We recommend signing up for the patient portal called "MyChart".  Sign up information is provided on this After Visit Summary.  MyChart is used to connect with patients for Virtual Visits (Telemedicine).  Patients are able to view lab/test results,  encounter notes, upcoming appointments, etc.  Non-urgent messages can be sent to your provider as well.   To learn more about what you can do with MyChart, go to [email or web address].

## 2024-04-07 ENCOUNTER — Other Ambulatory Visit (HOSPITAL_COMMUNITY): Payer: Self-pay

## 2024-04-07 ENCOUNTER — Telehealth: Payer: Self-pay

## 2024-04-07 NOTE — Addendum Note (Signed)
 Addended by: Parke Poisson on: 04/07/2024 04:11 PM   Modules accepted: Orders

## 2024-04-07 NOTE — Telephone Encounter (Signed)
 Pharmacy Patient Advocate Encounter   Received notification from CoverMyMeds that prior authorization for Diane [name] is required/requested.   Insurance verification completed.   The patient is insured through [employer] .   Per test claim: PA required; PA submitted to above mentioned insurance via CoverMyMeds Key/confirmation #/EOC [id number] Status is pending

## 2024-04-07 NOTE — Telephone Encounter (Signed)
 Pharmacy Patient Advocate Encounter  Received notification from [facility] that Prior Authorization for [facility] has been DENIED.  Full denial letter will be uploaded to the media tab. See denial reason below. CRITERIA NOT MET PER PLAN

## 2024-04-07 NOTE — Telephone Encounter (Signed)
 Pt made aware and verbalized understanding.

## 2024-04-14 ENCOUNTER — Other Ambulatory Visit: Payer: Self-pay | Admitting: Internal Medicine

## 2024-04-14 DIAGNOSIS — I25119 Atherosclerotic heart disease of native coronary artery with unspecified angina pectoris: Secondary | ICD-10-CM

## 2024-05-31 DIAGNOSIS — M25511 Pain in right shoulder: Secondary | ICD-10-CM | POA: Diagnosis not present

## 2024-06-07 DIAGNOSIS — M25511 Pain in right shoulder: Secondary | ICD-10-CM | POA: Diagnosis not present

## 2024-06-21 DIAGNOSIS — M25511 Pain in right shoulder: Secondary | ICD-10-CM | POA: Diagnosis not present

## 2024-06-25 ENCOUNTER — Other Ambulatory Visit: Payer: Self-pay | Admitting: Cardiology

## 2024-06-27 DIAGNOSIS — M25511 Pain in right shoulder: Secondary | ICD-10-CM | POA: Diagnosis not present

## 2024-06-29 DIAGNOSIS — M25511 Pain in right shoulder: Secondary | ICD-10-CM | POA: Diagnosis not present

## 2024-07-06 ENCOUNTER — Encounter: Payer: Self-pay | Admitting: Cardiology

## 2024-07-06 ENCOUNTER — Other Ambulatory Visit (HOSPITAL_COMMUNITY): Payer: Self-pay

## 2024-07-06 ENCOUNTER — Ambulatory Visit: Payer: Self-pay | Attending: Cardiology | Admitting: Cardiology

## 2024-07-06 VITALS — BP 114/73 | HR 87 | Ht 62.0 in | Wt 207.0 lb

## 2024-07-06 DIAGNOSIS — G43909 Migraine, unspecified, not intractable, without status migrainosus: Secondary | ICD-10-CM

## 2024-07-06 DIAGNOSIS — I10 Essential (primary) hypertension: Secondary | ICD-10-CM | POA: Diagnosis not present

## 2024-07-06 DIAGNOSIS — G8929 Other chronic pain: Secondary | ICD-10-CM | POA: Diagnosis not present

## 2024-07-06 DIAGNOSIS — M1990 Unspecified osteoarthritis, unspecified site: Secondary | ICD-10-CM

## 2024-07-06 DIAGNOSIS — I5032 Chronic diastolic (congestive) heart failure: Secondary | ICD-10-CM

## 2024-07-06 DIAGNOSIS — E782 Mixed hyperlipidemia: Secondary | ICD-10-CM

## 2024-07-06 DIAGNOSIS — I25118 Atherosclerotic heart disease of native coronary artery with other forms of angina pectoris: Secondary | ICD-10-CM | POA: Diagnosis not present

## 2024-07-06 DIAGNOSIS — R519 Headache, unspecified: Secondary | ICD-10-CM

## 2024-07-06 MED ORDER — SEMAGLUTIDE(0.25 OR 0.5MG/DOS) 2 MG/3ML ~~LOC~~ SOPN: 0.5000 mg | PEN_INJECTOR | SUBCUTANEOUS | 0 refills | Status: AC

## 2024-07-06 MED ORDER — BUTALBITAL-APAP-CAFFEINE 50-325-40 MG PO TABS: 1.0000 | ORAL_TABLET | Freq: Every day | ORAL | 0 refills | Status: DC | PRN

## 2024-07-06 MED ORDER — SEMAGLUTIDE (1 MG/DOSE) 4 MG/3ML ~~LOC~~ SOPN: 1.0000 mg | PEN_INJECTOR | SUBCUTANEOUS | 0 refills | Status: DC

## 2024-07-06 MED ORDER — BUTALBITAL-APAP-CAFFEINE 50-325-40 MG PO TABS: 1.0000 | ORAL_TABLET | Freq: Every day | ORAL | 0 refills | Status: AC | PRN

## 2024-07-06 NOTE — Progress Notes (Signed)
 Cardiology Office Note   Date:  07/06/2024  ID:  Diane [name] Diane [name], DOB Aug 07, 1951, MRN [id number] PCP: Millie Rolene PENNER, NP  [facility] Providers Cardiologist:  Lonni Hanson, MD     History of Present Illness Diane [name] is a 73 y.o. female with a past medical history of coronary disease status post PCI to the proximal RCA (05/2011), diastolic dysfunction, hypertension, hyperlipidemia, stroke, GERD, chronic HFpEF, who is here today for follow-up of her coronary artery disease and chronic HFpEF.   Echocardiogram completed in 04/2017 revealed LVEF of 65 to 70%, wall motion was normal without abnormality, G1 DD, without any valvular abnormalities noted.  Due to her vigorous contraction she was scheduled for Lexiscan  Myoview .  Myoview  was considered a low risk study without any significant ischemia.  She was then scheduled in 2021 for coronary CTA.  Coronary calcium  score of 74 which is 96 percentile for age and sex matched control.  Severe two-vessel coronary artery disease.  Transferred over for recommended for cardiac catheterization.  Left heart catheterization was completed on 05/2020 with severe single-vessel coronary artery disease with 80% proximal RCA stenosis that was hemodynamically significant.  Mild to moderate nonobstructive coronary artery disease involving the LAD and left circumflex.  Hyperdynamic left ventricular systolic function with normal filling pressure.  She underwent successful PCI to the proximal RCA.  She indicated Myoview  10/13/2022 that was low risk study.  EF 69%.  She had also presented to [facility] 09/18/2023 via EMS with complaints of chest pain.  Evaluation in the emergency department was completed and she was hospitalized and underwent stress testing which revealed no ischemia or infarct.    She was last seen in clinic 04/06/2024.  At that time she was doing well from a cardiac perspective.  She continued to have shortness of breath that contributed to  allergies.  And was complaining of shoulder pain.  States she had been compliant with her current medication regimen with no new side effects and denied any recent hospitalizations.  No medication changes were made and no further testing was ordered at that time.  She returns to clinic today stating that she has been doing fairly well from a cardiac perspective.  She states that she finally changed her insurance and her Ozempic  was approved for longstanding history of coronary artery disease.  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  She did follow-up with [facility] for injections to the right shoulder.  She states that she has been compliant with her current medication regimen with any undue side effects.  She has been tolerating her Ozempic .  She denies any chest pain, palpitations, peripheral edema, but does have some occasional shortness of breath that she thinks is related to the heat.  She denies any hospitalizations or visits to the emergency department.  ROS: 10 point review of system has been reviewed and considered negative except ones been listed in the HPI  Studies Reviewed      2D echo 12/02/2023 1. Left ventricular ejection fraction, by estimation, is 60 to 65%. The  left ventricle has normal function. The left ventricle has no regional  wall motion abnormalities. Left ventricular diastolic parameters are  consistent with Grade I diastolic  dysfunction (impaired relaxation). The average left ventricular global  longitudinal strain is -15.8 %. The global longitudinal strain is  abnormal.   2. Right ventricular systolic function is normal. The right ventricular  size is  normal. There is normal pulmonary artery systolic pressure.   3. The mitral valve is normal in structure. Mild mitral valve  regurgitation. No evidence of mitral stenosis.   4. The aortic valve is calcified. There is mild  calcification of the  aortic valve. There is mild thickening of the aortic valve. Aortic valve  regurgitation is mild. Mild aortic valve stenosis. Aortic valve mean  gradient measures 13.2 mmHg.   5. The inferior vena cava is normal in size with greater than 50%  respiratory variability, suggesting right atrial pressure of 3 mmHg.    Lexiscan  MPI 09/23/23 (outside facility) Impression   1. No reversible ischemia or infarction.  2. Normal left ventricular wall motion.  3. Left ventricular ejection fraction 74%  4. Non invasive risk stratification*: Low   Gated MPI 10/13/22   The study is normal. The study is low risk.   No ST deviation was noted.   Left ventricular function is normal. Nuclear stress EF: 69 %. The left ventricular ejection fraction is hyperdynamic (>65%). End diastolic cavity size is normal. End systolic cavity size is normal.   Prior study available for comparison from 05/19/2017.   Normal stress nuclear study with no ischemia or infarction.  Gated ejection fraction 69% with normal wall motion.   LHC 06/01/2020 Conclusions: Severe single-vessel coronary artery disease with 80% proximal RCA stenosis that is hemodynamically significant by DFR (0.88). Mild to moderate, non-obstructive coronary artery disease involving the LAD and LCx. Hyperdynamic left ventricular systolic function with normal filling pressure. Successful PCI to proximal RCA using Syergy 2.5 x 12 mm drug-eluting stent with 0% residual stenosis and TIMI-3 flow.   Recommendations: Dual antiplatelet therapy with aspirin  and clopidogrel  for at least 6 months. Aggressive secondary prevention. Anticipate same-day discharge if no post-cath complications.   Coronary CTA 05/10/2020 IMPRESSION: 1. Coronary calcium  score of 704. This was 16 percentile for age and sex matched control.   2. Severe two-vessel coronary artery disease. CADRADS 4A. Recommend Cardiac Catheterization.   3.  Normal coronary origin  with Co-dominance.   4.  Aortic Atherosclerosis   Lexiscan  Myoview  05/19/2017 Low risk study. There is a small in size, moderate in severity, reversible defect at the apex. Given normal wall motion, this most likely represents shifting breast attenuation. However, small area of ischemia cannot be excluded. The left ventricular ejection fraction is hyperdynamic (>65%). Sensitivity and specificity of the study are degraded by significant extracardiac (gut) activity.   TTE 05/05/2017 Study Conclusions   - Left ventricle: The cavity size was normal. Wall thickness was    normal. Systolic function was vigorous. The estimated ejection    fraction was in the range of 65% to 70%. Wall motion was normal;    there were no regional wall motion abnormalities. Doppler    parameters are consistent with abnormal left ventricular    relaxation (grade 1 diastolic dysfunction).  - Pulmonary arteries: Systolic pressure could not be accurately    estimated. Risk Assessment/Calculations           Physical Exam VS:  BP 114/73   Pulse 87   Ht 5' 2 (1.575 m)   Wt 207 lb (93.9 kg)   SpO2 96%   BMI 37.86 kg/m        Wt Readings from Last 3 Encounters:  07/06/24 207 lb (93.9 kg)  04/06/24 209 lb 9.6 oz (95.1 kg)  12/11/23 205 lb 3.2 oz (93.1 kg)    GEN: Well nourished, well developed in no acute  distress NECK: No JVD; No carotid bruits CARDIAC: RRR, no murmurs, rubs, gallops RESPIRATORY:  Clear to auscultation without rales, wheezing or rhonchi  ABDOMEN: Soft, non-tender, non-distended EXTREMITIES:  No edema; No deformity   ASSESSMENT AND PLAN Coronary artery disease of the native arteries with stable angina.  She denies any anginal or anginal equivalents.  Her previous testing and been unrevealing.  She is continued on clopidogrel  75 mg daily, atorvastatin  80 mg daily, ezetimibe  10 mg daily, and Ranexa  500 mg twice daily.  No further ischemic testing needed at this time.  Chronic HFpEF with  last LVEF of 60 to 65% on recent echocardiogram.  Difficult to determine fluid status due to body habitus but she appears to be euvolemic suffering from NYHA class II symptoms.  She has not required the use of any diuretic therapy but has been continued on benazepril  and HCTZ and diltiazem .  Defer medication changes at this time.  Will discuss SGLT2 inhibitors and MRA as a return.  Mixed hyperlipidemia with last LDL of 61.  She has remained at goal of less than 70.  She is continued on atorvastatin  80 mg daily and ezetimibe  10 mg daily.  Primary hypertension with blood pressure 114/73.  Blood pressures remain stable.  She is continued on her current medication regimen of benazepril  HCTZ 20/12.5 mg as well as diltiazem  180 mg daily she has been encouraged to continue to monitor blood pressures 1 to 2 hours postmedication administration as well.  Morbid obesity with a BMI of 37.86.  Weight loss would be beneficial.  She has been continued on Ozempic  with continuing it 0.5 mg for the next 2 weeks then increasing it to 1 mg for week 9 through week 12.  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  Ongoing management per orthopedics.  She just recently underwent injection and is doing physical therapy at this time for the right frozen shoulder.  She is hoping for 2 more weeks of therapy to continue.  Migraines that have been chronic since age 75.  And she is requesting a refill of her Fioricet today until she has her upcoming appointment with PCP.  Refill sent to pharmacy of request.       Dispo: Patient to return to clinic to see MD/APP in 6 weeks or sooner if needed for further evaluation after Ozempic  dosing has been changed.  Signed, Kamill Fulbright, NP

## 2024-07-06 NOTE — Patient Instructions (Signed)
 Medication Instructions:  Your physician recommends that you continue on your current medications as directed. Please refer to the Current Medication list given to you today.   *If you need a refill on your cardiac medications before your next appointment, please call your pharmacy*  Lab Work: No labs ordered today  If you have labs (blood work) drawn today and your tests are completely normal, you will receive your results only by: MyChart Message (if you have MyChart) OR A paper copy in the mail If you have any lab test that is abnormal or we need to change your treatment, we will call you to review the results.  Testing/Procedures: No test ordered today   Follow-Up: At [facility], you and your health needs are our priority.  As part of our continuing mission to provide you with exceptional heart care, our providers are all part of one team.  This team includes your primary Cardiologist (physician) and Advanced Practice Providers or APPs (Physician Assistants and Nurse Practitioners) who all work together to provide you with the care you need, when you need it.  Your next appointment:   6 week(s)  Provider:   Lonni Hanson, MD or Tylene Lunch, NP

## 2024-07-12 ENCOUNTER — Telehealth: Payer: Self-pay | Admitting: Pharmacy Technician

## 2024-07-12 ENCOUNTER — Other Ambulatory Visit (HOSPITAL_COMMUNITY): Payer: Self-pay

## 2024-07-12 NOTE — Telephone Encounter (Signed)
 Pharmacy Patient Advocate Encounter   Received notification from CoverMyMeds that prior authorization for Ozempic  (0.25 or 0.5 MG/DOSE) 2MG /3ML pen-injectors is required/requested.   Insurance verification completed.   The patient is insured through [facility] ADVANTAGE/RX ADVANCE .   Per test claim: PA required; PA submitted to above mentioned insurance via LATENT Key/confirmation #/EOC [id number] Status is pending    Per note on 07/06/24: Morbid obesity with a BMI of 37.86. Weight loss would be beneficial. She has been continued on Ozempic  with continuing it 0.5 mg for the next 2 weeks then increasing it to 1 mg for week 9 through week 12.   I called her pharmacy [facility] and they said she picked up the 1mg  on 07/06/24. They said they asked if she was going up to the 1mg  an she said yes so they sold the 1mg . I called and left the patient a message.

## 2024-07-13 NOTE — Telephone Encounter (Addendum)
   Pharmacy Patient Advocate Encounter  Received notification from [employer] that Prior Authorization for ozempic  2mg /65ml has been DENIED.  Full denial letter will be uploaded to the media tab. See denial reason below.   PA #/Case ID/Reference #: [id number]  She got the 1mg

## 2024-07-20 ENCOUNTER — Telehealth: Payer: Self-pay | Admitting: Internal Medicine

## 2024-07-20 NOTE — Telephone Encounter (Signed)
 Sounds like appropriate steps have been taken by our practice.  Please let me know if issues continue.  Lonni Hanson, MD [facility]

## 2024-07-20 NOTE — Telephone Encounter (Signed)
 Patient states she was returning a call, but there is no notes. Please advise

## 2024-07-20 NOTE — Telephone Encounter (Signed)
 Pt states her medicaid provider would like more information saying the pt has congestive heart failure and the pt would like a c/b.

## 2024-07-20 NOTE — Telephone Encounter (Signed)
 Spoke with pt and she had concerns that Medicare [employer] PPO was going to cancel her coverage due to lack of proof for her diagnosis of chronic congestive heart failure.  Pt states that a Medicare representative had called and stated they had sent a form for Dr. Mady to fill out on two occasions, but had not received it.  Pt provided me the telephone number to Medicare [employer] [phone]) to speak with an account representative to provide the CPT code for her ChronicCongestive Heart Failure diagnosis.  CPT Code I50.32 Chronic Heart Failure with Preserved Ejection Franction (HFpEF) (HCC) was provided to [place], [facility], at the [facility].

## 2024-07-31 ENCOUNTER — Other Ambulatory Visit: Payer: Self-pay | Admitting: Cardiology

## 2024-08-02 ENCOUNTER — Telehealth: Payer: Self-pay | Admitting: Cardiology

## 2024-08-02 NOTE — Telephone Encounter (Signed)
*  STAT* If patient is at the pharmacy, call can be transferred to refill team.   1. Which medications need to be refilled? (please list name of each medication and dose if known) Semaglutide , 1 MG/DOSE, 4 MG/3ML SOPN    2. Would you like to learn more about the convenience, safety, & potential cost savings by using the [facility]?     3. Are you open to using the [facility] (Type [facility].  ).   4. Which pharmacy/location (including street and city if local pharmacy) is medication to be sent to? [id number] [place], [place] - [address]    5. Do they need a 30 day or 90 day supply? 90 day

## 2024-08-03 ENCOUNTER — Telehealth: Payer: Self-pay | Admitting: Pharmacy Technician

## 2024-08-03 NOTE — Telephone Encounter (Signed)
 Pharmacy Patient Advocate Encounter   Received notification from CoverMyMeds that prior authorization for ozempic  1mg  is required/requested.   Insurance verification completed.   The patient is insured through [facility] ADVANTAGE/RX ADVANCE .   Per test claim: PA required; PA submitted to above mentioned insurance via latent Key/confirmation #/EOC [id number] Status is pending   Insurance asked if she was diabetic. She isn't as far as I can tell. Submitted with notes as requested stating: She has prescribed Ozempic  due to her coronary artery disease and weight.  She filled ozempic  0.25mg  on 05/31/24 and ozempic  1mg  07/06/24

## 2024-08-03 NOTE — Telephone Encounter (Signed)
Rx sent in another encounter

## 2024-08-04 ENCOUNTER — Other Ambulatory Visit (HOSPITAL_COMMUNITY): Payer: Self-pay

## 2024-08-04 NOTE — Telephone Encounter (Signed)
   Pharmacy Patient Advocate Encounter  Received notification from [employer] that Prior Authorization for ozempic  has been DENIED.  Full denial letter will be uploaded to the media tab. See denial reason below.

## 2024-08-04 NOTE — Telephone Encounter (Signed)
 The case was dismissed and it is in appeal. Appeal was received on 08/03/24 per [phone]. They said result should be finalized by 08/10/24  [id number] ref number for appeal

## 2024-08-18 ENCOUNTER — Ambulatory Visit: Admitting: Cardiology

## 2024-08-18 NOTE — Progress Notes (Deleted)
 Cardiology Office Note   Date:  08/18/2024  ID:  Diane [name], DOB 1951/05/17, MRN [id number] PCP: Millie Rolene PENNER, NP  [facility] Providers Cardiologist:  Lonni Hanson, MD { Click to update primary MD,subspecialty MD or APP then REFRESH:1}    History of Present Illness Diane [name] is a 73 y.o. female with a past medical history of coronary disease status post PCI to proximal RCA (05/2019), hypertension, hyperlipidemia, stroke, GERD, chronic HFpEF, who is here today to follow-up on her coronary artery disease and chronic HFpEF.   Echocardiogram completed in 04/2017 revealed LVEF of 65 to 70%, wall motion was normal without abnormality, G1 DD, without any valvular abnormalities noted.  Due to her vigorous contraction she was scheduled for Lexiscan  Myoview .  Myoview  was considered a low risk study without any significant ischemia.  She was then scheduled in 2021 for coronary CTA.  Coronary calcium  score of 74 which is 96 percentile for age and sex matched control.  Severe two-vessel coronary artery disease.  Transferred over for recommended for cardiac catheterization.  Left heart catheterization was completed on 05/2020 with severe single-vessel coronary artery disease with 80% proximal RCA stenosis that was hemodynamically significant.  Mild to moderate nonobstructive coronary artery disease involving the LAD and left circumflex.  Hyperdynamic left ventricular systolic function with normal filling pressure.  She underwent successful PCI to the proximal RCA.  She indicated Myoview  10/13/2022 that was low risk study.  EF 69%.  She had also presented to [facility] 09/18/2023 via EMS with complaints of chest pain.  Evaluation in the emergency department was completed and she was hospitalized and underwent stress testing which revealed no ischemia or infarct.    She was last seen in clinic 07/06/2024.  States she been doing fairly well from a cardiac perspective.  She states she did  finally changed her insurance and her Ozempic  was approved for longstanding history of coronary artery disease.  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  There were no medication changes that were made and further testing that was ordered at that time.  She returns to clinic today    ROS: 10 point review of systems has been reviewed and considered negative except ones been listed in the HPI  Studies Reviewed      2D echo 12/02/2023 1. Left ventricular ejection fraction, by estimation, is 60 to 65%. The  left ventricle has normal function. The left ventricle has no regional  wall motion abnormalities. Left ventricular diastolic parameters are  consistent with Grade I diastolic  dysfunction (impaired relaxation). The average left ventricular global  longitudinal strain is -15.8 %. The global longitudinal strain is  abnormal.   2. Right ventricular systolic function is normal. The right ventricular  size is normal. There is normal pulmonary artery systolic pressure.   3. The mitral valve is normal in structure. Mild mitral valve  regurgitation. No evidence of mitral stenosis.   4. The aortic valve is calcified. There is mild calcification of the  aortic valve. There is mild thickening of the aortic valve. Aortic valve  regurgitation is mild. Mild aortic valve stenosis. Aortic valve mean  gradient measures 13.2 mmHg.   5. The inferior vena cava is normal in size with greater than 50%  respiratory variability, suggesting right atrial pressure of 3 mmHg.    Lexiscan  MPI 09/23/23 (outside facility) Impression   1. No reversible ischemia or infarction.  2. Normal left ventricular wall motion.  3. Left ventricular  ejection fraction 74%  4. Non invasive risk stratification*: Low   Gated MPI 10/13/22   The study is normal. The study is low risk.   No ST deviation was noted.   Left ventricular function is normal. Nuclear stress EF: 69 %. The left ventricular  ejection fraction is hyperdynamic (>65%). End diastolic cavity size is normal. End systolic cavity size is normal.   Prior study available for comparison from 05/19/2017.   Normal stress nuclear study with no ischemia or infarction.  Gated ejection fraction 69% with normal wall motion.   LHC 06/01/2020 Conclusions: Severe single-vessel coronary artery disease with 80% proximal RCA stenosis that is hemodynamically significant by DFR (0.88). Mild to moderate, non-obstructive coronary artery disease involving the LAD and LCx. Hyperdynamic left ventricular systolic function with normal filling pressure. Successful PCI to proximal RCA using Syergy 2.5 x 12 mm drug-eluting stent with 0% residual stenosis and TIMI-3 flow.   Recommendations: Dual antiplatelet therapy with aspirin  and clopidogrel  for at least 6 months. Aggressive secondary prevention. Anticipate same-day discharge if no post-cath complications.   Coronary CTA 05/10/2020 IMPRESSION: 1. Coronary calcium  score of 704. This was 74 percentile for age and sex matched control.   2. Severe two-vessel coronary artery disease. CADRADS 4A. Recommend Cardiac Catheterization.   3.  Normal coronary origin with Co-dominance.   4.  Aortic Atherosclerosis   Lexiscan  Myoview  05/19/2017 Low risk study. There is a small in size, moderate in severity, reversible defect at the apex. Given normal wall motion, this most likely represents shifting breast attenuation. However, small area of ischemia cannot be excluded. The left ventricular ejection fraction is hyperdynamic (>65%). Sensitivity and specificity of the study are degraded by significant extracardiac (gut) activity.   TTE 05/05/2017 Study Conclusions   - Left ventricle: The cavity size was normal. Wall thickness was    normal. Systolic function was vigorous. The estimated ejection    fraction was in the range of 65% to 70%. Wall motion was normal;    there were no regional wall motion  abnormalities. Doppler    parameters are consistent with abnormal left ventricular    relaxation (grade 1 diastolic dysfunction).  - Pulmonary arteries: Systolic pressure could not be accurately    estimated. Risk Assessment/Calculations   No BP recorded.  {Refresh Note OR Click here to enter BP  :1}***       Physical Exam VS:  There were no vitals taken for this visit.       Wt Readings from Last 3 Encounters:  07/06/24 207 lb (93.9 kg)  04/06/24 209 lb 9.6 oz (95.1 kg)  12/11/23 205 lb 3.2 oz (93.1 kg)    GEN: Well nourished, well developed in no acute distress NECK: No JVD; No carotid bruits CARDIAC: ***RRR, no murmurs, rubs, gallops RESPIRATORY:  Clear to auscultation without rales, wheezing or rhonchi  ABDOMEN: Soft, non-tender, non-distended EXTREMITIES:  No edema; No deformity   ASSESSMENT AND PLAN Coronary artery disease for years with stable angina Chronic HFpEF Mixed hyperlipidemia Primary hypertension Morbid obesity Arthritis Migraines    {Are you ordering a CV Procedure (e.g. stress test, cath, DCCV, TEE, etc)?   Press F2        :[id number]}  Dispo: ***  Signed, Nazeer Romney, NP

## 2024-08-31 NOTE — Progress Notes (Deleted)
 Cardiology Office Note   Date:  08/31/2024  ID:  Diane [name] Kitty, DOB [birth year], MRN [id number] PCP: Millie Rolene PENNER, NP  [facility] Providers Cardiologist:  Lonni Hanson, MD { Click to update primary MD,subspecialty MD or APP then REFRESH:1}    History of Present Illness Diane [name] is a 73 y.o. female with a past medical history of coronary disease status post PCI to proximal RCA (05/2019), hypertension, hyperlipidemia, stroke, GERD, chronic HFpEF, who is here today to follow-up on her coronary artery disease and chronic HFpEF.   Echocardiogram completed in 04/2017 revealed LVEF of 65 to 70%, wall motion was normal without abnormality, G1 DD, without any valvular abnormalities noted.  Due to her vigorous contraction she was scheduled for Lexiscan  Myoview .  Myoview  was considered a low risk study without any significant ischemia.  She was then scheduled in 2021 for coronary CTA.  Coronary calcium  score of 74 which is 96 percentile for age and sex matched control.  Severe two-vessel coronary artery disease.  Transferred over for recommended for cardiac catheterization.  Left heart catheterization was completed on 05/2020 with severe single-vessel coronary artery disease with 80% proximal RCA stenosis that was hemodynamically significant.  Mild to moderate nonobstructive coronary artery disease involving the LAD and left circumflex.  Hyperdynamic left ventricular systolic function with normal filling pressure.  She underwent successful PCI to the proximal RCA.  She indicated Myoview  10/13/2022 that was low risk study.  EF 69%.  She had also presented to [facility] 09/18/2023 via EMS with complaints of chest pain.  Evaluation in the emergency department was completed and she was hospitalized and underwent stress testing which revealed no ischemia or infarct.    She was last seen in clinic 07/06/2024.  States she been doing fairly well from a cardiac perspective.  She states she did  finally changed her insurance and her Ozempic  was approved for longstanding history of coronary artery disease.  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  There were no medication changes that were made and further testing that was ordered at that time.  She returns to clinic today    ROS: 10 point review of systems has been reviewed and considered negative except ones been listed in the HPI  Studies Reviewed      2D echo 12/02/2023 1. Left ventricular ejection fraction, by estimation, is 60 to 65%. The  left ventricle has normal function. The left ventricle has no regional  wall motion abnormalities. Left ventricular diastolic parameters are  consistent with Grade I diastolic  dysfunction (impaired relaxation). The average left ventricular global  longitudinal strain is -15.8 %. The global longitudinal strain is  abnormal.   2. Right ventricular systolic function is normal. The right ventricular  size is normal. There is normal pulmonary artery systolic pressure.   3. The mitral valve is normal in structure. Mild mitral valve  regurgitation. No evidence of mitral stenosis.   4. The aortic valve is calcified. There is mild calcification of the  aortic valve. There is mild thickening of the aortic valve. Aortic valve  regurgitation is mild. Mild aortic valve stenosis. Aortic valve mean  gradient measures 13.2 mmHg.   5. The inferior vena cava is normal in size with greater than 50%  respiratory variability, suggesting right atrial pressure of 3 mmHg.    Lexiscan  MPI 09/23/23 (outside facility) Impression   1. No reversible ischemia or infarction.  2. Normal left ventricular wall motion.  3. Left ventricular  ejection fraction 74%  4. Non invasive risk stratification*: Low   Gated MPI 10/13/22   The study is normal. The study is low risk.   No ST deviation was noted.   Left ventricular function is normal. Nuclear stress EF: 69 %. The left ventricular  ejection fraction is hyperdynamic (>65%). End diastolic cavity size is normal. End systolic cavity size is normal.   Prior study available for comparison from 05/19/2017.   Normal stress nuclear study with no ischemia or infarction.  Gated ejection fraction 69% with normal wall motion.   LHC 06/01/2020 Conclusions: Severe single-vessel coronary artery disease with 80% proximal RCA stenosis that is hemodynamically significant by DFR (0.88). Mild to moderate, non-obstructive coronary artery disease involving the LAD and LCx. Hyperdynamic left ventricular systolic function with normal filling pressure. Successful PCI to proximal RCA using Syergy 2.5 x 12 mm drug-eluting stent with 0% residual stenosis and TIMI-3 flow.   Recommendations: Dual antiplatelet therapy with aspirin  and clopidogrel  for at least 6 months. Aggressive secondary prevention. Anticipate same-day discharge if no post-cath complications.   Coronary CTA 05/10/2020 IMPRESSION: 1. Coronary calcium  score of 704. This was 54 percentile for age and sex matched control.   2. Severe two-vessel coronary artery disease. CADRADS 4A. Recommend Cardiac Catheterization.   3.  Normal coronary origin with Co-dominance.   4.  Aortic Atherosclerosis   Lexiscan  Myoview  05/19/2017 Low risk study. There is a small in size, moderate in severity, reversible defect at the apex. Given normal wall motion, this most likely represents shifting breast attenuation. However, small area of ischemia cannot be excluded. The left ventricular ejection fraction is hyperdynamic (>65%). Sensitivity and specificity of the study are degraded by significant extracardiac (gut) activity.   TTE 05/05/2017 Study Conclusions   - Left ventricle: The cavity size was normal. Wall thickness was    normal. Systolic function was vigorous. The estimated ejection    fraction was in the range of 65% to 70%. Wall motion was normal;    there were no regional wall motion  abnormalities. Doppler    parameters are consistent with abnormal left ventricular    relaxation (grade 1 diastolic dysfunction).  - Pulmonary arteries: Systolic pressure could not be accurately    estimated. Risk Assessment/Calculations   No BP recorded.  {Refresh Note OR Click here to enter BP  :1}***       Physical Exam VS:  There were no vitals taken for this visit.       Wt Readings from Last 3 Encounters:  07/06/24 207 lb (93.9 kg)  04/06/24 209 lb 9.6 oz (95.1 kg)  12/11/23 205 lb 3.2 oz (93.1 kg)    GEN: Well nourished, well developed in no acute distress NECK: No JVD; No carotid bruits CARDIAC: ***RRR, no murmurs, rubs, gallops RESPIRATORY:  Clear to auscultation without rales, wheezing or rhonchi  ABDOMEN: Soft, non-tender, non-distended EXTREMITIES:  No edema; No deformity   ASSESSMENT AND PLAN Coronary artery disease for years with stable angina Chronic HFpEF Mixed hyperlipidemia Primary hypertension Morbid obesity Arthritis Migraines    {Are you ordering a CV Procedure (e.g. stress test, cath, DCCV, TEE, etc)?   Press F2        :[id number]}  Dispo: ***  Signed, Ricquel Foulk, NP

## 2024-09-01 ENCOUNTER — Other Ambulatory Visit: Payer: Self-pay | Admitting: Internal Medicine

## 2024-09-01 ENCOUNTER — Ambulatory Visit: Admitting: Cardiology

## 2024-09-01 DIAGNOSIS — Z1231 Encounter for screening mammogram for malignant neoplasm of breast: Secondary | ICD-10-CM

## 2024-09-06 ENCOUNTER — Encounter: Payer: Self-pay | Admitting: Cardiology

## 2024-09-06 ENCOUNTER — Ambulatory Visit: Attending: Cardiology | Admitting: Cardiology

## 2024-09-06 VITALS — BP 120/78 | HR 83 | Ht 61.0 in | Wt 199.0 lb

## 2024-09-06 DIAGNOSIS — I10 Essential (primary) hypertension: Secondary | ICD-10-CM | POA: Diagnosis not present

## 2024-09-06 DIAGNOSIS — E782 Mixed hyperlipidemia: Secondary | ICD-10-CM | POA: Diagnosis not present

## 2024-09-06 DIAGNOSIS — M1990 Unspecified osteoarthritis, unspecified site: Secondary | ICD-10-CM

## 2024-09-06 DIAGNOSIS — I25118 Atherosclerotic heart disease of native coronary artery with other forms of angina pectoris: Secondary | ICD-10-CM | POA: Diagnosis not present

## 2024-09-06 DIAGNOSIS — I5032 Chronic diastolic (congestive) heart failure: Secondary | ICD-10-CM | POA: Diagnosis not present

## 2024-09-06 DIAGNOSIS — G43909 Migraine, unspecified, not intractable, without status migrainosus: Secondary | ICD-10-CM | POA: Diagnosis not present

## 2024-09-06 NOTE — Patient Instructions (Signed)
 Medication Instructions:   Your physician recommends that you continue on your current medications as directed. Please refer to the Current Medication list given to you today.   *If you need a refill on your cardiac medications before your next appointment, please call your pharmacy*  Lab Work:  No labs ordered today   If you have labs (blood work) drawn today and your tests are completely normal, you will receive your results only by: MyChart Message (if you have MyChart) OR A paper copy in the mail If you have any lab test that is abnormal or we need to change your treatment, we will call you to review the results.  Testing/Procedures:  No test ordered today   Follow-Up: At [facility], you and your health needs are our priority.  As part of our continuing mission to provide you with exceptional heart care, our providers are all part of one team.  This team includes your primary Cardiologist (physician) and Advanced Practice Providers or APPs (Physician Assistants and Nurse Practitioners) who all work together to provide you with the care you need, when you need it.  Your next appointment:    6 month(s)  Provider:    You may see Lonni Hanson, MD or one of the following Advanced Practice Providers on your designated Care Team:   Lonni Meager, NP Lesley Maffucci, PA-C Bernardino Bring, PA-C Cadence [place], PA-C Tylene Lunch, NP Barnie Hila, NP

## 2024-09-06 NOTE — Progress Notes (Signed)
 Cardiology Office Note   Date:  09/06/2024  ID:  Diane [name], DOB [birth year], MRN [id number] PCP: Millie Rolene PENNER, NP  [facility] Providers Cardiologist:  Lonni Hanson, MD Cardiology APP:  Gerard Frederick, NP     History of Present Illness Diane [name] is a 73 y.o. female with a past medical history of coronary disease status post PCI to proximal RCA (05/2019), hypertension, hyperlipidemia, stroke, GERD, chronic HFpEF, who is here today to follow-up on her coronary artery disease and chronic HFpEF.   Echocardiogram completed in 04/2017 revealed LVEF of 65 to 70%, wall motion was normal without abnormality, G1 DD, without any valvular abnormalities noted.  Due to her vigorous contraction she was scheduled for Lexiscan  Myoview .  Myoview  was considered a low risk study without any significant ischemia.  She was then scheduled in 2021 for coronary CTA.  Coronary calcium  score of 74 which is 96 percentile for age and sex matched control.  Severe two-vessel coronary artery disease.  Transferred over for recommended for cardiac catheterization.  Left heart catheterization was completed on 05/2020 with severe single-vessel coronary artery disease with 80% proximal RCA stenosis that was hemodynamically significant.  Mild to moderate nonobstructive coronary artery disease involving the LAD and left circumflex.  Hyperdynamic left ventricular systolic function with normal filling pressure.  She underwent successful PCI to the proximal RCA.  She indicated Myoview  10/13/2022 that was low risk study.  EF 69%.  She had also presented to [facility] 09/18/2023 via EMS with complaints of chest pain.  Evaluation in the emergency department was completed and she was hospitalized and underwent stress testing which revealed no ischemia or infarct.    She was last seen in clinic 07/06/2024.  States she been doing fairly well from a cardiac perspective.  She states she did finally changed her insurance  and her Ozempic  was approved for longstanding history of coronary artery disease.  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  There were no medication changes that were made and further testing that was ordered at that time.  She returns to clinic today stating that she has been doing well from a cardiac perspective.  She is disheartened today as her insurance had approved her Ozempic  for 1 month and then stated they would no longer cover it after she had had an 8 pound weight loss after being on it for short period of time.  She was tolerating medication without any undue side effects.  She said she called and talked to the insurance company and was advised that there was something wrong with her prescription and the reason why was not being covered.  States that she has been compliant with her current medication regimen with any done due to side effects.  Denies any recent hospitalizations or visits to the emergency department.  ROS: 10 point review of systems has been reviewed and considered negative except ones been listed in the HPI  Studies Reviewed     2D echo 12/02/2023 1. Left ventricular ejection fraction, by estimation, is 60 to 65%. The  left ventricle has normal function. The left ventricle has no regional  wall motion abnormalities. Left ventricular diastolic parameters are  consistent with Grade I diastolic  dysfunction (impaired relaxation). The average left ventricular global  longitudinal strain is -15.8 %. The global longitudinal strain is  abnormal.   2. Right ventricular systolic function is normal. The right ventricular  size is normal. There is normal pulmonary artery systolic pressure.  3. The mitral valve is normal in structure. Mild mitral valve  regurgitation. No evidence of mitral stenosis.   4. The aortic valve is calcified. There is mild calcification of the  aortic valve. There is mild thickening of the aortic valve. Aortic valve   regurgitation is mild. Mild aortic valve stenosis. Aortic valve mean  gradient measures 13.2 mmHg.   5. The inferior vena cava is normal in size with greater than 50%  respiratory variability, suggesting right atrial pressure of 3 mmHg.    Lexiscan  MPI 09/23/23 (outside facility) Impression   1. No reversible ischemia or infarction.  2. Normal left ventricular wall motion.  3. Left ventricular ejection fraction 74%  4. Non invasive risk stratification*: Low   Gated MPI 10/13/22   The study is normal. The study is low risk.   No ST deviation was noted.   Left ventricular function is normal. Nuclear stress EF: 69 %. The left ventricular ejection fraction is hyperdynamic (>65%). End diastolic cavity size is normal. End systolic cavity size is normal.   Prior study available for comparison from 05/19/2017.   Normal stress nuclear study with no ischemia or infarction.  Gated ejection fraction 69% with normal wall motion.   LHC 06/01/2020 Conclusions: Severe single-vessel coronary artery disease with 80% proximal RCA stenosis that is hemodynamically significant by DFR (0.88). Mild to moderate, non-obstructive coronary artery disease involving the LAD and LCx. Hyperdynamic left ventricular systolic function with normal filling pressure. Successful PCI to proximal RCA using Syergy 2.5 x 12 mm drug-eluting stent with 0% residual stenosis and TIMI-3 flow.   Recommendations: Dual antiplatelet therapy with aspirin  and clopidogrel  for at least 6 months. Aggressive secondary prevention. Anticipate same-day discharge if no post-cath complications.   Coronary CTA 05/10/2020 IMPRESSION: 1. Coronary calcium  score of 704. This was 40 percentile for age and sex matched control.   2. Severe two-vessel coronary artery disease. CADRADS 4A. Recommend Cardiac Catheterization.   3.  Normal coronary origin with Co-dominance.   4.  Aortic Atherosclerosis   Lexiscan  Myoview  05/19/2017 Low risk  study. There is a small in size, moderate in severity, reversible defect at the apex. Given normal wall motion, this most likely represents shifting breast attenuation. However, small area of ischemia cannot be excluded. The left ventricular ejection fraction is hyperdynamic (>65%). Sensitivity and specificity of the study are degraded by significant extracardiac (gut) activity.   TTE 05/05/2017 Study Conclusions   - Left ventricle: The cavity size was normal. Wall thickness was    normal. Systolic function was vigorous. The estimated ejection    fraction was in the range of 65% to 70%. Wall motion was normal;    there were no regional wall motion abnormalities. Doppler    parameters are consistent with abnormal left ventricular    relaxation (grade 1 diastolic dysfunction).  - Pulmonary arteries: Systolic pressure could not be accurately    estimated. Risk Assessment/Calculations           Physical Exam VS:  BP 120/78 (BP Location: Left Arm, Patient Position: Sitting, Cuff Size: Normal)   Pulse 83   Ht 5' 1 (1.549 m)   Wt 199 lb (90.3 kg)   SpO2 100%   BMI 37.60 kg/m        Wt Readings from Last 3 Encounters:  09/06/24 199 lb (90.3 kg)  07/06/24 207 lb (93.9 kg)  04/06/24 209 lb 9.6 oz (95.1 kg)    GEN: Well nourished, well developed in no acute distress NECK:  No JVD; No carotid bruits CARDIAC: RRR, no murmurs, rubs, gallops RESPIRATORY:  Clear to auscultation without rales, wheezing or rhonchi  ABDOMEN: Soft, non-tender, non-distended EXTREMITIES:  No edema; No deformity   ASSESSMENT AND PLAN Coronary artery disease for years with stable angina.  She continues to deny any anginal or anginal equivalents.  Her previous testing had been unrevealing.  She is continued on clopidogrel  75 mg daily, atorvastatin  80 mg daily, ezetimibe  10 mg daily, Ranexa  500 mg twice daily.  There was no EKG that was completed today.  No further ischemic testing is needed at this time.  Chronic  HFpEF with last LVEF of 60-65% on last echocardiogram.  She appears to be euvolemic on exam.  She is without symptoms of decompensation.  Suffers from NYHA class I-II symptoms.  She has not required the use of any diuretic therapy and has been continued on benazepril  and HCTZ and diltiazem .  Discussed possibility of SGLT2 inhibitor or MRA therapy that have been deferred at this time until the final ruling on Ozempic  has been made.  Mixed hyperlipidemia with a last LDL 61 levels less than goal of 70.  She is continued on atorvastatin  80 mg daily and ezetimibe  10 mg daily.  Primary hypertension with a blood pressure of 120/78.  Blood pressures remain stable.  She has been continued on her current medication regimen benazepril  HCTZ 20/12.5 milligrams as well as diltiazem  180 mg daily.  She has been encouraged to continue to monitor pressures 1 to 2 hours postmedication administration as well.  Morbid obesity with a BMI of 37.60.  She is down 8 pounds in the last month.  Will reach out to pharmacy to determine if there is any other assistance that we can offer for possible coverage of her Ozempic .  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  Migraines that are chronic since the age of 60.  She is continued on as needed Fioricet.       Dispo: Patient to return to clinic to see MD/APP in 6 months or sooner if needed for further evaluation  Signed, Dominigue Gellner, NP

## 2024-09-07 ENCOUNTER — Telehealth: Payer: Self-pay | Admitting: Pharmacist

## 2024-09-07 ENCOUNTER — Ambulatory Visit
Admission: RE | Admit: 2024-09-07 | Discharge: 2024-09-07 | Disposition: A | Discharge status: Home / Self Care | Source: Ambulatory Visit | Attending: Internal Medicine | Admitting: Internal Medicine

## 2024-09-07 DIAGNOSIS — Z1231 Encounter for screening mammogram for malignant neoplasm of breast: Secondary | ICD-10-CM

## 2024-09-07 NOTE — Telephone Encounter (Signed)
-----   Message from Nurse Jon R sent at 09/06/2024  4:20 PM EDT ----- Regarding: Ozempic  Please help patient with Ozempic  - she has been on it for a few months and lost weight but cannot get it any longer

## 2024-09-08 ENCOUNTER — Other Ambulatory Visit (HOSPITAL_COMMUNITY): Payer: Self-pay

## 2024-09-08 ENCOUNTER — Telehealth: Payer: Self-pay | Admitting: Pharmacy Technician

## 2024-09-08 NOTE — Telephone Encounter (Signed)
 Pharmacy Patient Advocate Encounter  Received notification from [employer] that Prior Authorization for wegovy  0.25mg  has been APPROVED from 09/08/24 to 12/28/24. Ran test claim, Copay is $100.00. This test claim was processed through [facility]- copay amounts may vary at other pharmacies due to pharmacy/plan contracts, or as the patient moves through the different stages of their insurance plan.   PA #/Case ID/Reference #: [id number]

## 2024-09-08 NOTE — Telephone Encounter (Signed)
 Per pt calls   Pharmacy Patient Advocate Encounter   Received notification from Pt Calls Messages that prior authorization for wegovy  is required/requested.   Insurance verification completed.   The patient is insured through [facility] [employer] .   Per test claim: PA required; PA submitted to above mentioned insurance via Latent Key/confirmation #/[id number] [facility] Status is pending

## 2024-09-12 NOTE — Telephone Encounter (Signed)
 Call pt to inform about Wegovy  PA approval. N/A LVM

## 2024-09-13 ENCOUNTER — Ambulatory Visit: Payer: Self-pay | Admitting: Internal Medicine

## 2024-09-13 ENCOUNTER — Telehealth: Payer: Self-pay | Admitting: Cardiology

## 2024-09-13 MED ORDER — WEGOVY 1 MG/0.5ML ~~LOC~~ SOAJ: 1.0000 mg | SUBCUTANEOUS | 0 refills | Status: DC

## 2024-09-13 NOTE — Telephone Encounter (Signed)
*  STAT* If patient is at the pharmacy, call can be transferred to refill team.   1. Which medications need to be refilled? (please list name of each medication and dose if known)   Semaglutide , 1 MG/DOSE, (OZEMPIC , 1 MG/DOSE,) 4 MG/3ML SOPN     2. Would you like to learn more about the convenience, safety, & potential cost savings by using the [facility]? No    3. Are you open to using the [facility] (Type [facility]. ). No   4. Which pharmacy/location (including street and city if local pharmacy) is medication to be sent to? [id number] - [place], [place] - [address]     5. Do they need a 30 day or 90 day supply? 30 day

## 2024-10-01 ENCOUNTER — Other Ambulatory Visit: Payer: Self-pay | Admitting: Cardiology

## 2024-10-10 ENCOUNTER — Other Ambulatory Visit: Payer: Self-pay | Admitting: Internal Medicine

## 2024-11-10 DIAGNOSIS — F411 Generalized anxiety disorder: Secondary | ICD-10-CM | POA: Diagnosis not present

## 2024-11-10 DIAGNOSIS — J4520 Mild intermittent asthma, uncomplicated: Secondary | ICD-10-CM | POA: Diagnosis not present

## 2024-11-10 DIAGNOSIS — I5032 Chronic diastolic (congestive) heart failure: Secondary | ICD-10-CM | POA: Diagnosis not present

## 2024-11-10 DIAGNOSIS — E782 Mixed hyperlipidemia: Secondary | ICD-10-CM | POA: Diagnosis not present

## 2024-11-10 DIAGNOSIS — G43109 Migraine with aura, not intractable, without status migrainosus: Secondary | ICD-10-CM | POA: Diagnosis not present

## 2024-11-10 DIAGNOSIS — Z23 Encounter for immunization: Secondary | ICD-10-CM | POA: Diagnosis not present

## 2024-11-10 DIAGNOSIS — I110 Hypertensive heart disease with heart failure: Secondary | ICD-10-CM | POA: Diagnosis not present

## 2024-11-21 ENCOUNTER — Other Ambulatory Visit: Payer: Self-pay | Admitting: Cardiology

## 2025-01-24 ENCOUNTER — Telehealth: Payer: Self-pay

## 2025-01-24 NOTE — Progress Notes (Signed)
 Pharmacy Quality Measure Review  This patient is appearing on a report for being at risk of failing the adherence measure for diabetes medications this calendar year.   Medication: Ozempic  1mg  Inj Last fill date: 07/06/2024 for 28 day supply  Patient was switched to Wegovy  1mg  on 09/13/2024. It has not been filled since 10/12/2024 with a 28 day supply, so patient is currently nonadherent. There is no documentation that the medication has been discontinued and there are currently 0 refills at the pharmacy.   Lisle Slocumb Student - PharmD
